# Patient Record
Sex: Female | Born: 1940 | Race: White | Hispanic: No | Smoking: Never smoker
Health system: Southern US, Community
[De-identification: ages and names within clinical notes are randomized; demographics above are authoritative.]

## PROBLEM LIST (undated history)

## (undated) DIAGNOSIS — K644 Residual hemorrhoidal skin tags: Secondary | ICD-10-CM

## (undated) DIAGNOSIS — D126 Benign neoplasm of colon, unspecified: Secondary | ICD-10-CM

## (undated) DIAGNOSIS — Z8719 Personal history of other diseases of the digestive system: Secondary | ICD-10-CM

## (undated) DIAGNOSIS — F419 Anxiety disorder, unspecified: Secondary | ICD-10-CM

## (undated) DIAGNOSIS — K649 Unspecified hemorrhoids: Secondary | ICD-10-CM

## (undated) DIAGNOSIS — L719 Rosacea, unspecified: Secondary | ICD-10-CM

## (undated) DIAGNOSIS — H4050X0 Glaucoma secondary to other eye disorders, unspecified eye, stage unspecified: Secondary | ICD-10-CM

## (undated) DIAGNOSIS — I10 Essential (primary) hypertension: Secondary | ICD-10-CM

## (undated) DIAGNOSIS — K222 Esophageal obstruction: Secondary | ICD-10-CM

## (undated) DIAGNOSIS — M5412 Radiculopathy, cervical region: Secondary | ICD-10-CM

## (undated) DIAGNOSIS — H409 Unspecified glaucoma: Secondary | ICD-10-CM

## (undated) DIAGNOSIS — K580 Irritable bowel syndrome with diarrhea: Secondary | ICD-10-CM

## (undated) DIAGNOSIS — N816 Rectocele: Secondary | ICD-10-CM

## (undated) DIAGNOSIS — Z860101 Personal history of adenomatous and serrated colon polyps: Secondary | ICD-10-CM

## (undated) DIAGNOSIS — F329 Major depressive disorder, single episode, unspecified: Secondary | ICD-10-CM

## (undated) DIAGNOSIS — N200 Calculus of kidney: Secondary | ICD-10-CM

## (undated) DIAGNOSIS — K210 Gastro-esophageal reflux disease with esophagitis: Secondary | ICD-10-CM

## (undated) DIAGNOSIS — E119 Type 2 diabetes mellitus without complications: Secondary | ICD-10-CM

## (undated) DIAGNOSIS — H9190 Unspecified hearing loss, unspecified ear: Secondary | ICD-10-CM

## (undated) DIAGNOSIS — K449 Diaphragmatic hernia without obstruction or gangrene: Secondary | ICD-10-CM

## (undated) DIAGNOSIS — Z8619 Personal history of other infectious and parasitic diseases: Secondary | ICD-10-CM

## (undated) DIAGNOSIS — M1990 Unspecified osteoarthritis, unspecified site: Secondary | ICD-10-CM

## (undated) DIAGNOSIS — R350 Frequency of micturition: Secondary | ICD-10-CM

## (undated) DIAGNOSIS — F32A Depression, unspecified: Secondary | ICD-10-CM

## (undated) DIAGNOSIS — Z974 Presence of external hearing-aid: Secondary | ICD-10-CM

## (undated) DIAGNOSIS — N189 Chronic kidney disease, unspecified: Secondary | ICD-10-CM

## (undated) DIAGNOSIS — Z85828 Personal history of other malignant neoplasm of skin: Secondary | ICD-10-CM

## (undated) DIAGNOSIS — H6120 Impacted cerumen, unspecified ear: Secondary | ICD-10-CM

## (undated) DIAGNOSIS — K5730 Diverticulosis of large intestine without perforation or abscess without bleeding: Secondary | ICD-10-CM

## (undated) DIAGNOSIS — R000 Tachycardia, unspecified: Secondary | ICD-10-CM

## (undated) DIAGNOSIS — N812 Incomplete uterovaginal prolapse: Secondary | ICD-10-CM

## (undated) DIAGNOSIS — M8580 Other specified disorders of bone density and structure, unspecified site: Secondary | ICD-10-CM

## (undated) DIAGNOSIS — Z86010 Personal history of colonic polyps: Secondary | ICD-10-CM

## (undated) DIAGNOSIS — K219 Gastro-esophageal reflux disease without esophagitis: Secondary | ICD-10-CM

## (undated) DIAGNOSIS — Z9889 Other specified postprocedural states: Secondary | ICD-10-CM

## (undated) DIAGNOSIS — N393 Stress incontinence (female) (male): Secondary | ICD-10-CM

## (undated) DIAGNOSIS — K589 Irritable bowel syndrome without diarrhea: Secondary | ICD-10-CM

## (undated) DIAGNOSIS — E785 Hyperlipidemia, unspecified: Secondary | ICD-10-CM

## (undated) DIAGNOSIS — Z87442 Personal history of urinary calculi: Secondary | ICD-10-CM

## (undated) DIAGNOSIS — A0472 Enterocolitis due to Clostridium difficile, not specified as recurrent: Secondary | ICD-10-CM

## (undated) HISTORY — DX: Glaucoma secondary to other eye disorders, unspecified eye, stage unspecified: H40.50X0

## (undated) HISTORY — PX: TONSILLECTOMY: SUR1361

## (undated) HISTORY — DX: Major depressive disorder, single episode, unspecified: F32.9

## (undated) HISTORY — DX: Benign neoplasm of colon, unspecified: D12.6

## (undated) HISTORY — DX: Other specified disorders of bone density and structure, unspecified site: M85.80

## (undated) HISTORY — DX: Gastro-esophageal reflux disease without esophagitis: K21.9

## (undated) HISTORY — DX: Residual hemorrhoidal skin tags: K64.4

## (undated) HISTORY — DX: Unspecified glaucoma: H40.9

## (undated) HISTORY — DX: Calculus of kidney: N20.0

## (undated) HISTORY — DX: Diverticulosis of large intestine without perforation or abscess without bleeding: K57.30

## (undated) HISTORY — DX: Hyperlipidemia, unspecified: E78.5

## (undated) HISTORY — DX: Rosacea, unspecified: L71.9

## (undated) HISTORY — PX: BUNIONECTOMY: SHX129

## (undated) HISTORY — PX: KIDNEY SURGERY: SHX687

## (undated) HISTORY — DX: Irritable bowel syndrome without diarrhea: K58.9

## (undated) HISTORY — DX: Radiculopathy, cervical region: M54.12

## (undated) HISTORY — DX: Tachycardia, unspecified: R00.0

## (undated) HISTORY — DX: Impacted cerumen, unspecified ear: H61.20

## (undated) HISTORY — DX: Rectocele: N81.6

## (undated) HISTORY — DX: Unspecified hearing loss, unspecified ear: H91.90

## (undated) HISTORY — DX: Enterocolitis due to Clostridium difficile, not specified as recurrent: A04.72

## (undated) HISTORY — PX: CERVICAL FUSION: SHX112

## (undated) HISTORY — PX: COLONOSCOPY: SHX174

## (undated) HISTORY — DX: Gastro-esophageal reflux disease with esophagitis: K21.0

## (undated) HISTORY — DX: Anxiety disorder, unspecified: F41.9

## (undated) HISTORY — DX: Esophageal obstruction: K22.2

## (undated) HISTORY — DX: Essential (primary) hypertension: I10

---

## 1958-08-11 HISTORY — PX: KIDNEY SURGERY: SHX687

## 1998-06-07 ENCOUNTER — Other Ambulatory Visit: Admission: RE | Admit: 1998-06-07 | Discharge: 1998-06-07 | Payer: Self-pay | Admitting: Gynecology

## 1998-07-04 ENCOUNTER — Other Ambulatory Visit: Admission: RE | Admit: 1998-07-04 | Discharge: 1998-07-04 | Payer: Self-pay | Admitting: Gynecology

## 1999-07-30 ENCOUNTER — Other Ambulatory Visit: Admission: RE | Admit: 1999-07-30 | Discharge: 1999-07-30 | Payer: Self-pay | Admitting: Gynecology

## 2000-09-21 ENCOUNTER — Other Ambulatory Visit: Admission: RE | Admit: 2000-09-21 | Discharge: 2000-09-21 | Payer: Self-pay | Admitting: Gynecology

## 2001-11-10 ENCOUNTER — Other Ambulatory Visit: Admission: RE | Admit: 2001-11-10 | Discharge: 2001-11-10 | Payer: Self-pay | Admitting: Gynecology

## 2004-07-15 ENCOUNTER — Ambulatory Visit: Payer: Self-pay | Admitting: Internal Medicine

## 2004-08-11 DIAGNOSIS — D126 Benign neoplasm of colon, unspecified: Secondary | ICD-10-CM

## 2004-08-11 HISTORY — DX: Benign neoplasm of colon, unspecified: D12.6

## 2004-08-27 ENCOUNTER — Ambulatory Visit: Payer: Self-pay | Admitting: Internal Medicine

## 2004-11-08 ENCOUNTER — Other Ambulatory Visit: Admission: RE | Admit: 2004-11-08 | Discharge: 2004-11-08 | Payer: Self-pay | Admitting: Gynecology

## 2005-01-24 ENCOUNTER — Ambulatory Visit: Payer: Self-pay | Admitting: Internal Medicine

## 2005-01-30 ENCOUNTER — Ambulatory Visit: Payer: Self-pay | Admitting: Internal Medicine

## 2005-01-30 ENCOUNTER — Encounter (INDEPENDENT_AMBULATORY_CARE_PROVIDER_SITE_OTHER): Payer: Self-pay | Admitting: *Deleted

## 2005-06-02 ENCOUNTER — Ambulatory Visit: Payer: Self-pay | Admitting: Internal Medicine

## 2005-10-01 ENCOUNTER — Ambulatory Visit: Payer: Self-pay | Admitting: Internal Medicine

## 2005-10-01 ENCOUNTER — Encounter: Payer: Self-pay | Admitting: Internal Medicine

## 2005-10-01 ENCOUNTER — Other Ambulatory Visit: Admission: RE | Admit: 2005-10-01 | Discharge: 2005-10-01 | Payer: Self-pay | Admitting: Neurosurgery

## 2006-05-20 ENCOUNTER — Ambulatory Visit: Payer: Self-pay | Admitting: Internal Medicine

## 2006-08-12 ENCOUNTER — Ambulatory Visit: Payer: Self-pay | Admitting: Internal Medicine

## 2006-10-06 ENCOUNTER — Ambulatory Visit: Payer: Self-pay | Admitting: Internal Medicine

## 2006-11-10 ENCOUNTER — Ambulatory Visit: Payer: Self-pay | Admitting: Internal Medicine

## 2006-11-14 LAB — HM COLONOSCOPY

## 2006-11-16 ENCOUNTER — Encounter: Payer: Self-pay | Admitting: Internal Medicine

## 2006-11-16 ENCOUNTER — Ambulatory Visit: Payer: Self-pay | Admitting: Internal Medicine

## 2006-12-10 ENCOUNTER — Ambulatory Visit: Payer: Self-pay | Admitting: Internal Medicine

## 2006-12-10 LAB — CONVERTED CEMR LAB
Basophils Relative: 0.3 % (ref 0.0–1.0)
Eosinophils Absolute: 0.1 10*3/uL (ref 0.0–0.6)
Eosinophils Relative: 1 % (ref 0.0–5.0)
Hemoglobin: 13.9 g/dL (ref 12.0–15.0)
Lymphocytes Relative: 29.5 % (ref 12.0–46.0)
MCV: 92.6 fL (ref 78.0–100.0)
Monocytes Absolute: 0.5 10*3/uL (ref 0.2–0.7)
Monocytes Relative: 7.1 % (ref 3.0–11.0)
Neutro Abs: 4.5 10*3/uL (ref 1.4–7.7)
Platelets: 271 10*3/uL (ref 150–400)
WBC: 7.2 10*3/uL (ref 4.5–10.5)

## 2007-03-11 ENCOUNTER — Ambulatory Visit: Payer: Self-pay | Admitting: Internal Medicine

## 2007-03-11 DIAGNOSIS — F329 Major depressive disorder, single episode, unspecified: Secondary | ICD-10-CM

## 2007-03-11 DIAGNOSIS — K644 Residual hemorrhoidal skin tags: Secondary | ICD-10-CM | POA: Insufficient documentation

## 2007-03-11 DIAGNOSIS — F325 Major depressive disorder, single episode, in full remission: Secondary | ICD-10-CM | POA: Insufficient documentation

## 2007-03-11 DIAGNOSIS — F3341 Major depressive disorder, recurrent, in partial remission: Secondary | ICD-10-CM

## 2007-03-11 DIAGNOSIS — I10 Essential (primary) hypertension: Secondary | ICD-10-CM | POA: Insufficient documentation

## 2007-03-11 DIAGNOSIS — E1159 Type 2 diabetes mellitus with other circulatory complications: Secondary | ICD-10-CM | POA: Insufficient documentation

## 2007-03-11 DIAGNOSIS — J018 Other acute sinusitis: Secondary | ICD-10-CM | POA: Insufficient documentation

## 2007-03-11 DIAGNOSIS — F3289 Other specified depressive episodes: Secondary | ICD-10-CM

## 2007-03-11 DIAGNOSIS — L719 Rosacea, unspecified: Secondary | ICD-10-CM | POA: Insufficient documentation

## 2007-03-11 DIAGNOSIS — K5730 Diverticulosis of large intestine without perforation or abscess without bleeding: Secondary | ICD-10-CM

## 2007-03-11 HISTORY — DX: Essential (primary) hypertension: I10

## 2007-03-11 HISTORY — DX: Diverticulosis of large intestine without perforation or abscess without bleeding: K57.30

## 2007-03-11 HISTORY — DX: Rosacea, unspecified: L71.9

## 2007-03-11 HISTORY — DX: Major depressive disorder, single episode, unspecified: F32.9

## 2007-03-11 HISTORY — DX: Residual hemorrhoidal skin tags: K64.4

## 2007-03-11 HISTORY — DX: Other specified depressive episodes: F32.89

## 2007-03-11 LAB — CONVERTED CEMR LAB
Basophils Absolute: 0 10*3/uL (ref 0.0–0.1)
Eosinophils Absolute: 0.1 10*3/uL (ref 0.0–0.6)
HCT: 36.1 % (ref 36.0–46.0)
MCHC: 34.3 g/dL (ref 30.0–36.0)
MCV: 93.9 fL (ref 78.0–100.0)
Neutrophils Relative %: 64.1 % (ref 43.0–77.0)
Platelets: 270 10*3/uL (ref 150–400)
RBC: 3.84 M/uL — ABNORMAL LOW (ref 3.87–5.11)
RDW: 12.1 % (ref 11.5–14.6)

## 2007-08-20 ENCOUNTER — Telehealth: Payer: Self-pay | Admitting: Internal Medicine

## 2007-09-22 ENCOUNTER — Ambulatory Visit: Payer: Self-pay | Admitting: Internal Medicine

## 2007-09-22 DIAGNOSIS — E785 Hyperlipidemia, unspecified: Secondary | ICD-10-CM

## 2007-09-22 DIAGNOSIS — E1169 Type 2 diabetes mellitus with other specified complication: Secondary | ICD-10-CM | POA: Insufficient documentation

## 2007-09-22 HISTORY — DX: Hyperlipidemia, unspecified: E78.5

## 2007-09-22 LAB — CONVERTED CEMR LAB
Basophils Absolute: 0 10*3/uL (ref 0.0–0.1)
Calcium: 9.7 mg/dL (ref 8.4–10.5)
Chloride: 105 meq/L (ref 96–112)
Cholesterol: 173 mg/dL (ref 0–200)
Eosinophils Absolute: 0 10*3/uL (ref 0.0–0.6)
Eosinophils Relative: 0.8 % (ref 0.0–5.0)
GFR calc Af Amer: 92 mL/min
GFR calc non Af Amer: 76 mL/min
Glucose, Bld: 111 mg/dL — ABNORMAL HIGH (ref 70–99)
HDL: 41.9 mg/dL (ref >39.0)
Lymphocytes Relative: 29.1 % (ref 12.0–46.0)
MCHC: 32.7 g/dL (ref 30.0–36.0)
MCV: 94.3 fL (ref 78.0–100.0)
Monocytes Relative: 6.3 % (ref 3.0–11.0)
Neutro Abs: 3.9 10*3/uL (ref 1.4–7.7)
Platelets: 330 10*3/uL (ref 150–400)
RBC: 4.31 M/uL (ref 3.87–5.11)
Total CHOL/HDL Ratio: 4.1
Triglycerides: 126 mg/dL (ref 0–149)

## 2007-11-11 ENCOUNTER — Ambulatory Visit: Payer: Self-pay | Admitting: Internal Medicine

## 2007-12-13 DIAGNOSIS — H6120 Impacted cerumen, unspecified ear: Secondary | ICD-10-CM | POA: Insufficient documentation

## 2007-12-13 HISTORY — DX: Impacted cerumen, unspecified ear: H61.20

## 2008-03-22 ENCOUNTER — Ambulatory Visit: Payer: Self-pay | Admitting: Internal Medicine

## 2008-03-23 ENCOUNTER — Encounter: Payer: Self-pay | Admitting: Internal Medicine

## 2008-05-10 ENCOUNTER — Inpatient Hospital Stay (HOSPITAL_COMMUNITY): Admission: RE | Admit: 2008-05-10 | Discharge: 2008-05-13 | Payer: Self-pay | Admitting: Neurosurgery

## 2008-05-10 HISTORY — PX: ANTERIOR CERVICAL DECOMP/DISCECTOMY FUSION: SHX1161

## 2008-10-20 ENCOUNTER — Telehealth: Payer: Self-pay | Admitting: Family Medicine

## 2008-10-20 ENCOUNTER — Ambulatory Visit: Payer: Self-pay | Admitting: Internal Medicine

## 2008-10-20 DIAGNOSIS — IMO0002 Reserved for concepts with insufficient information to code with codable children: Secondary | ICD-10-CM | POA: Insufficient documentation

## 2008-12-11 ENCOUNTER — Telehealth: Payer: Self-pay | Admitting: Internal Medicine

## 2009-03-14 ENCOUNTER — Ambulatory Visit: Payer: Self-pay | Admitting: Family Medicine

## 2009-03-14 ENCOUNTER — Encounter: Payer: Self-pay | Admitting: Internal Medicine

## 2009-03-14 DIAGNOSIS — R059 Cough, unspecified: Secondary | ICD-10-CM | POA: Insufficient documentation

## 2009-03-14 DIAGNOSIS — R0989 Other specified symptoms and signs involving the circulatory and respiratory systems: Secondary | ICD-10-CM | POA: Insufficient documentation

## 2009-03-14 DIAGNOSIS — R05 Cough: Secondary | ICD-10-CM

## 2009-03-14 DIAGNOSIS — R0609 Other forms of dyspnea: Secondary | ICD-10-CM

## 2009-03-16 ENCOUNTER — Telehealth: Payer: Self-pay | Admitting: Family Medicine

## 2009-06-01 ENCOUNTER — Encounter: Payer: Self-pay | Admitting: Internal Medicine

## 2009-06-29 ENCOUNTER — Ambulatory Visit: Payer: Self-pay | Admitting: Internal Medicine

## 2009-07-02 ENCOUNTER — Telehealth: Payer: Self-pay | Admitting: Internal Medicine

## 2009-10-22 ENCOUNTER — Telehealth: Payer: Self-pay | Admitting: Internal Medicine

## 2009-11-05 ENCOUNTER — Ambulatory Visit: Payer: Self-pay | Admitting: Internal Medicine

## 2009-11-05 DIAGNOSIS — T887XXA Unspecified adverse effect of drug or medicament, initial encounter: Secondary | ICD-10-CM | POA: Insufficient documentation

## 2009-11-05 DIAGNOSIS — H4050X0 Glaucoma secondary to other eye disorders, unspecified eye, stage unspecified: Secondary | ICD-10-CM | POA: Insufficient documentation

## 2009-11-05 DIAGNOSIS — M5412 Radiculopathy, cervical region: Secondary | ICD-10-CM

## 2009-11-05 DIAGNOSIS — M5010 Cervical disc disorder with radiculopathy, unspecified cervical region: Secondary | ICD-10-CM | POA: Insufficient documentation

## 2009-11-05 HISTORY — DX: Radiculopathy, cervical region: M54.12

## 2009-11-05 HISTORY — DX: Glaucoma secondary to other eye disorders, unspecified eye, stage unspecified: H40.50X0

## 2009-11-05 LAB — CONVERTED CEMR LAB
BUN: 12 mg/dL (ref 6–23)
Bilirubin, Direct: 0.1 mg/dL (ref 0.0–0.3)
CO2: 32 meq/L (ref 19–32)
Calcium: 9.8 mg/dL (ref 8.4–10.5)
Chloride: 104 meq/L (ref 96–112)
Creatinine, Ser: 0.7 mg/dL (ref 0.4–1.2)
Direct LDL: 113.7 mg/dL
Eosinophils Absolute: 0.1 10*3/uL (ref 0.0–0.7)
Lymphocytes Relative: 34.4 % (ref 12.0–46.0)
MCHC: 32 g/dL (ref 30.0–36.0)
MCV: 97 fL (ref 78.0–100.0)
Monocytes Absolute: 0.5 10*3/uL (ref 0.1–1.0)
Neutrophils Relative %: 56.6 % (ref 43.0–77.0)
Platelets: 255 10*3/uL (ref 150.0–400.0)
TSH: 0.99 microintl units/mL (ref 0.35–5.50)
Total Bilirubin: 1.1 mg/dL (ref 0.3–1.2)
WBC: 6.4 10*3/uL (ref 4.5–10.5)

## 2010-06-14 ENCOUNTER — Emergency Department (HOSPITAL_COMMUNITY): Admission: EM | Admit: 2010-06-14 | Discharge: 2010-06-14 | Payer: Self-pay | Admitting: Emergency Medicine

## 2010-06-27 ENCOUNTER — Ambulatory Visit: Payer: Self-pay | Admitting: Internal Medicine

## 2010-07-11 DIAGNOSIS — S41109A Unspecified open wound of unspecified upper arm, initial encounter: Secondary | ICD-10-CM | POA: Insufficient documentation

## 2010-08-22 ENCOUNTER — Encounter: Payer: Self-pay | Admitting: Internal Medicine

## 2010-08-22 ENCOUNTER — Other Ambulatory Visit
Admission: RE | Admit: 2010-08-22 | Discharge: 2010-08-22 | Payer: Self-pay | Source: Home / Self Care | Admitting: Gynecology

## 2010-08-22 ENCOUNTER — Ambulatory Visit
Admission: RE | Admit: 2010-08-22 | Discharge: 2010-08-22 | Payer: Self-pay | Source: Home / Self Care | Attending: Gynecology | Admitting: Gynecology

## 2010-08-27 ENCOUNTER — Ambulatory Visit
Admission: RE | Admit: 2010-08-27 | Discharge: 2010-08-27 | Payer: Self-pay | Source: Home / Self Care | Attending: Gynecology | Admitting: Gynecology

## 2010-09-12 NOTE — Assessment & Plan Note (Signed)
Summary: BP / HTN CONCERNS // RS   Vital Signs:  Patient profile:   70 year old female Height:      53 inches Weight:      125 pounds BMI:     31.40 Temp:     98.2 degrees F oral Pulse rate:   80 / minute Resp:     14 per minute BP sitting:   130 / 80  (left arm)  Vitals Entered By: Willy Eddy, LPN (November 05, 2009 11:31 AM) CC: roa- htn, Hypertension Management   CC:  roa- htn and Hypertension Management.  History of Present Illness: new diagnosis of glacoma and need referral  Hypertension History:      She denies headache, chest pain, palpitations, dyspnea with exertion, orthopnea, PND, peripheral edema, visual symptoms, neurologic problems, syncope, and side effects from treatment.        Positive major cardiovascular risk factors include female age 40 years old or older, hyperlipidemia, and hypertension.  Negative major cardiovascular risk factors include negative family history for ischemic heart disease and non-tobacco-user status.     Preventive Screening-Counseling & Management  Alcohol-Tobacco     Smoking Status: never     Passive Smoke Exposure: yes  Problems Prior to Update: 1)  Cough  (ICD-786.2) 2)  Dyspnea On Exertion  (ICD-786.09) 3)  Abrasion, Arm  (ICD-919.0) 4)  Cerumen Impaction, Bilateral  (ICD-380.4) 5)  Hyperlipidemia  (ICD-272.4) 6)  Sinusitis, Acute Nec  (ICD-461.8) 7)  Hemorrhoids, External w/o Complication  (ICD-455.3) 8)  Diverticulosis, Colon  (ICD-562.10) 9)  Rosacea  (ICD-695.3) 10)  Depression  (ICD-311) 11)  Hypertension  (ICD-401.9)  Current Problems (verified): 1)  Cough  (ICD-786.2) 2)  Dyspnea On Exertion  (ICD-786.09) 3)  Abrasion, Arm  (ICD-919.0) 4)  Cerumen Impaction, Bilateral  (ICD-380.4) 5)  Hyperlipidemia  (ICD-272.4) 6)  Sinusitis, Acute Nec  (ICD-461.8) 7)  Hemorrhoids, External w/o Complication  (ICD-455.3) 8)  Diverticulosis, Colon  (ICD-562.10) 9)  Rosacea  (ICD-695.3) 10)  Depression  (ICD-311) 11)   Hypertension  (ICD-401.9)  Medications Prior to Update: 1)  Avalide 300-12.5 Mg  Tabs (Irbesartan-Hydrochlorothiazide) .... Once Daily 2)  Lexapro 20 Mg Tabs (Escitalopram Oxalate) .... One By Mouth Daily 3)  Niacin Flush Free 500 Mg  Caps (Inositol Niacinate) .... Once Daily 4)  Calcium 600 1500 Mg  Tabs (Calcium Carbonate) .... One Twice A Day 5)  Bl Flax Seed Oil 1000 Mg  Caps (Flaxseed (Linseed)) .... Once Daily 6)  Alprazolam 0.25 Mg Tabs (Alprazolam) .[name].. 1 Three Times A Day Prn 7)  Doxycycline Hyclate 100 Mg Tabs (Doxycycline Hyclate) .... One Two Times A Day For 7 Days If Not Allergic To Doxycycline  Current Medications (verified): 1)  Avalide 300-12.5 Mg  Tabs (Irbesartan-Hydrochlorothiazide) .... Once Daily 2)  Lexapro 20 Mg Tabs (Escitalopram Oxalate) .... One By Mouth Daily 3)  Niacin Flush Free 500 Mg  Caps (Inositol Niacinate) .... Once Daily 4)  Calcium 600 1500 Mg  Tabs (Calcium Carbonate) .... One Twice A Day 5)  Bl Flax Seed Oil 1000 Mg  Caps (Flaxseed (Linseed)) .... Once Daily 6)  Alprazolam 0.25 Mg Tabs (Alprazolam) .[name].. 1 Three Times A Day As Needed  Allergies (verified): 1)  * Codiene  Past History:  Family History: Last updated: 09/22/2007 Family History Lung cancer father smoker Family History Psychiatric care mother committed suicide  Social History: Last updated: 03/11/2007 Retired Married Never Smoked  Risk Factors: Smoking Status: never (11/05/2009) Passive Smoke Exposure: yes (  11/05/2009)  Past medical, surgical, family and social histories (including risk factors) reviewed, and no changes noted (except as noted below).  Past Medical History: Reviewed history from 09/22/2007 and no changes required. Hypertension Depression Rosacea 695.3 Diverticulosis, colon hemorrhoids 455.3 Hyperlipidemia  Past Surgical History: Reviewed history from 03/11/2007 and no changes required. renal surgery age 72  partial Nephrectomy due to  stones  Family History: Reviewed history from 09/22/2007 and no changes required. Family History Lung cancer father smoker Family History Psychiatric care mother committed suicide  Social History: Reviewed history from 03/11/2007 and no changes required. Retired Married Never Smoked  Review of Systems  The patient denies anorexia, fever, weight loss, weight gain, vision loss, decreased hearing, hoarseness, chest pain, syncope, dyspnea on exertion, peripheral edema, prolonged cough, headaches, hemoptysis, abdominal pain, melena, hematochezia, severe indigestion/heartburn, hematuria, incontinence, genital sores, muscle weakness, suspicious skin lesions, transient blindness, difficulty walking, depression, unusual weight change, abnormal bleeding, enlarged lymph nodes, angioedema, and breast masses.    Physical Exam  General:  Well-developed,well-nourished,in no acute distress; alert,appropriate and cooperative throughout examination Head:  normocephalic and atraumatic.   Eyes:  pupils equal, pupils round, and pupils reactive to light.   Ears:  R ear normal and L ear normal.   Nose:  no external deformity and no nasal discharge.   Mouth:  Oral mucosa and oropharynx without lesions or exudates.  Teeth in good repair. Neck:  No deformities, masses, or tenderness noted. Lungs:  Normal respiratory effort, chest expands symmetrically. Lungs are clear to auscultation, no crackles or wheezes. Heart:  normal rate, regular rhythm, and no murmur.     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 Once daily  BP today: 130/80 Prior  BP: 120/70 (03/14/2009)  Prior 10 Yr Risk Heart Disease: Not enough information (09/22/2007)  Labs Reviewed: K+: 5.1 (09/22/2007) Creat: : 0.8 (09/22/2007)   Chol: 173 (09/22/2007)   HDL: 41.9 (09/22/2007)   LDL: 106 (09/22/2007)   TG: 126 (09/22/2007)  Orders: TLB-BMP (Basic Metabolic Panel-BMET) (80048-METABOL)  Problem # 3:  HYPERLIPIDEMIA (ICD-272.4)  Prior 10 Yr Risk Heart Disease: Not enough information (09/22/2007)   HDL:41.9 (09/22/2007)  LDL:106 (09/22/2007)  Chol:173 (09/22/2007)  Trig:126 (09/22/2007)  Orders: TLB-TSH (Thyroid Stimulating Hormone) (84443-TSH) TLB-Cholesterol, HDL (83718-HDL) TLB-Cholesterol, Direct LDL (83721-DIRLDL) TLB-Cholesterol, Total (82465-CHO)  Problem # 4:  DEPRESSION (ICD-311)  Her updated medication list for this problem includes:    Lexapro 20 Mg Tabs (Escitalopram oxalate) ..... One by mouth daily    Alprazolam 0.25 Mg Tabs (Alprazolam) .[name]... 1 three times a day as needed  Orders: TLB-BMP (Basic Metabolic Panel-BMET) (80048-METABOL)  BP today: 130/80 Prior BP: 120/70 (03/14/2009)  Prior 10 Yr Risk Heart Disease: Not enough information (09/22/2007)  Labs Reviewed: K+: 5.1 (09/22/2007) Creat: : 0.8 (09/22/2007)   Chol: 173 (09/22/2007)   HDL: 41.9 (09/22/2007)   LDL: 106 (09/22/2007)   TG: 126 (09/22/2007)  Discussed treatment options, including trial of antidpressant medication. Will refer to behavioral health. Follow-up call in in 24-48 hours and recheck in 2 weeks, sooner as needed. Patient agrees to call if any worsening of symptoms or thoughts of doing harm arise. Verified that the patient has no suicidal ideation at this time.   Complete Medication  List: 1)  Avalide 300-12.5 Mg Tabs (Irbesartan-hydrochlorothiazide) .... Once daily 2)  Lexapro 20 Mg Tabs (Escitalopram oxalate) .... One by mouth daily 3)  Niacin Flush Free 500 Mg Caps (Inositol niacinate) .... Once daily 4)  Calcium 600 1500 Mg Tabs (Calcium carbonate) .... One  twice a day 5)  Bl Flax Seed Oil 1000 Mg Caps (Flaxseed (linseed)) .... Once daily 6)  Alprazolam 0.25 Mg Tabs (Alprazolam) .[name].. 1 three times a day as needed  Other Orders: TLB-CBC Platelet - w/Differential (85025-CBCD)  Hypertension Assessment/Plan:      The patient's hypertensive risk group is category B: At least one risk factor (excluding diabetes) with no target organ damage.  Her calculated 10 year risk of coronary heart disease is 13 %.  Today's blood pressure is 130/80.  Her blood pressure goal is < 140/90.  Patient Instructions: 1)  for eye call [facility] ( [facility] [address]  or [facility])  Please schedule a follow-up appointment in 6 months.

## 2010-09-12 NOTE — Progress Notes (Signed)
Summary: avalide  Phone Note Call from Patient Call back at [facility] Phone [phone]   Summary of Call: Has changed insurance to [facility] & needs new rx called to [facility] for Avalide  300/12.5.  Making appt to see Dr. Shela Commons ASAP.    Initial call taken by: Rudy Jew, RN,  October 22, 2009 11:37 AM    Prescriptions: Adan Sis 300-12.5 MG  TABS (IRBESARTAN-HYDROCHLOROTHIAZIDE) once daily  #30 x 1   Entered by:   Willy Eddy, LPN   Authorized by:   Stacie Glaze MD   Signed by:   Willy Eddy, LPN on 09/81/1914   Method used:   Electronically to        [facility]  [phone]* (retail)       [address]       [place], [place]  [address]       Ph: [phone] or [phone]       Fax: [phone]   RxID:   [id number]

## 2010-09-12 NOTE — Assessment & Plan Note (Signed)
Summary: SUTURE REMOVAL / FLU SHOT // RS----OK PER BONNYE, LPN   Vital Signs:  Patient profile:   70 year old female Height:      53 inches Weight:      125 pounds BMI:     31.40 Temp:     98.6 degrees F oral Pulse rate:   80 / minute Resp:     14 per minute BP sitting:   130 / 80  Vitals Entered By: Willy Eddy, LPN (June 27, 2010 11:22 AM) CC: suture removal out of left elbow- 2 sutures removed and flue vaccine Is Patient Diabetic? No   Primary Care Shahrzad Koble:  Stacie Glaze MD  CC:  suture removal out of left elbow- 2 sutures removed and flue vaccine.  History of Present Illness: removla of sutures from ER  Preventive Screening-Counseling & Management  Alcohol-Tobacco     Smoking Status: never     Passive Smoke Exposure: yes  Current Problems (verified): 1)  Uns Advrs Eff Uns Rx Medicinal&biological Sbstnc  (ICD-995.20) 2)  Cervical Radiculopathy  (ICD-723.4) 3)  Glaucoma Associated W/unspec Ocular Disorder  (ICD-365.60) 4)  Cough  (ICD-786.2) 5)  Dyspnea On Exertion  (ICD-786.09) 6)  Abrasion, Arm  (ICD-919.0) 7)  Cerumen Impaction, Bilateral  (ICD-380.4) 8)  Hyperlipidemia  (ICD-272.4) 9)  Sinusitis, Acute Nec  (ICD-461.8) 10)  Hemorrhoids, External w/o Complication  (ICD-455.3) 11)  Diverticulosis, Colon  (ICD-562.10) 12)  Rosacea  (ICD-695.3) 13)  Depression  (ICD-311) 14)  Hypertension  (ICD-401.9)  Current Medications (verified): 1)  Avalide 300-12.5 Mg  Tabs (Irbesartan-Hydrochlorothiazide) .... Once Daily 2)  Lexapro 20 Mg Tabs (Escitalopram Oxalate) .... One By Mouth Daily 3)  Niacin Flush Free 500 Mg  Caps (Inositol Niacinate) .... Once Daily 4)  Calcium 600 1500 Mg  Tabs (Calcium Carbonate) .... One Twice A Day 5)  Bl Flax Seed Oil 1000 Mg  Caps (Flaxseed (Linseed)) .... Once Daily 6)  Alprazolam 0.25 Mg Tabs (Alprazolam) .[name].. 1 Three Times A Day As Needed  Allergies (verified): 1)  * Codiene  Review of Systems       Flu Vaccine  Consent Questions     Do you have a history of severe allergic reactions to this vaccine? no    Any prior history of allergic reactions to egg and/or gelatin? no    Do you have a sensitivity to the preservative Thimersol? no    Do you have a past history of Guillan-Barre Syndrome? no    Do you currently have an acute febrile illness? no    Have you ever had a severe reaction to latex? no    Vaccine information given and explained to patient? yes    Are you currently pregnant? no    Lot Number:[id number]   Exp Date:02/08/2011   Site Given  Left Deltoid IM    Impression & Recommendations:  Problem # 1:  WOUND, OPEN, ARM, WITHOUT COMPLICATION (ICD-884.0)  removal of suture and discssion of would care  Orders: No Charge Patient Arrived (NCPA0) (NCPA0) Suture Removal by Non-Operative MD ([id number])  Complete Medication List: 1)  Avalide 300-12.5 Mg Tabs (Irbesartan-hydrochlorothiazide) .... Once daily 2)  Lexapro 20 Mg Tabs (Escitalopram oxalate) .... One by mouth daily 3)  Niacin Flush Free 500 Mg Caps (Inositol niacinate) .... Once daily 4)  Calcium 600 1500 Mg Tabs (Calcium carbonate) .... One twice a day 5)  Bl Flax Seed Oil 1000 Mg Caps (Flaxseed (linseed)) .... Once daily 6)  Alprazolam 0.25 Mg  Tabs (Alprazolam) .[name].. 1 three times a day as needed  Other Orders: Flu Vaccine 22yrs + MEDICARE PATIENTS (Z6109) Administration Flu vaccine - MCR (G0008)   Orders Added: 1)  Flu Vaccine 89yrs + MEDICARE PATIENTS [Q2039] 2)  Administration Flu vaccine - MCR [G0008] 3)  No Charge Patient Arrived (NCPA0) [NCPA0] 4)  Suture Removal by Non-Operative MD [S0630]

## 2010-12-24 NOTE — Op Note (Signed)
[name], [name]                ACCOUNT NO.:  [id number]   MEDICAL RECORD NO.:  [id number]          PATIENT TYPE:  INP   LOCATION:  3008                         FACILITY:  [facility]   PHYSICIAN:  Payton Doughty, M.D.      DATE OF BIRTH:  04-06-1941   DATE OF PROCEDURE:  05/10/2008  DATE OF DISCHARGE:                               OPERATIVE REPORT   PREOPERATIVE DIAGNOSIS:  Spondylosis, C4-5 and C5-6.   POSTOPERATIVE DIAGNOSIS:  Spondylosis, C4-5 and C5-6.   PROCEDURE:  C4-5 and C5-6 anterior cervical decompression and fusion  with a Reflex hybrid plate.   SURGEON:  Payton Doughty, MD   ANESTHESIA:  General endotracheal.   PREPARATION:  Prepped and draped with alcohol wipe.   COMPLICATIONS:  None.   NURSE ASSISTANT:  [place].   DOCTOR ASSISTANT:  Hewitt Shorts, MD   BODY OF TEXT:  This is a 70 year old lady with severe cervical  spondylosis and early myelopathy taken to the operating room and  smoothly anesthetized and intubated, placed supine on the operating  table in the halter head traction with the neck slightly extended.  Following shave, prep, and drape in usual sterile fashion, the skin was  incised in the midline to the medial border of the sternocleidomastoid  muscle on the left side.  The platysma was identified, elevated,  divided,and undermined.  Sternocleidomastoid was identified.  Medial  dissection revealed carotid artery and jugular vein that were retracted  laterally to the left, the trachea and esophagus retracted laterally to  the right exposing the bones of the anterior cervical spine.  Marker was  placed.  Intraoperative x-ray obtained to confirm correctness of the  level.  The longus colli was taken down bilaterally, and the __________  self-retaining retractor placed transversely in a cephalocaudal  direction.  Diskectomy was carried out at C4-5 and C5-6 under gross  observation.  The operating microscope was then brought in.  We used  microdissection technique to remove the remaining disks, remove the  osteophytic spurs, explore the neural foramen, and make sure the nerve  roots were free.  Following complete decompression at each level, a 7-mm  bone graft was fashioned with patellar allograft and tapped in each  level.  A 34-mm Reflex hybrid plate was placed.  Final films showed that  the screws in C4 were low, so a 36-mm plate was placed using a 12-mm  screw.  Final x-ray showed good placement of bone graft, plate, and  screws.  The wound was irrigated and hemostasis assured.  Successive  layers of 3-0 Vicryl and 4-0 Vicryl were used to close.  Benzoin and  Steri-Strips were placed and made occlusive with Telfa and OpSite, and  the patient returned to the recovery room in good condition.           ______________________________  Payton Doughty, M.D.     MWR/MEDQ  D:  05/10/2008  T:  05/10/2008  Job:  [id number]

## 2010-12-24 NOTE — Discharge Summary (Signed)
[name], [name]                ACCOUNT NO.:  [id number]   MEDICAL RECORD NO.:  [id number]          PATIENT TYPE:  INP   LOCATION:  3008                         FACILITY:  [facility]   PHYSICIAN:  Coletta Memos, M.D.     DATE OF BIRTH:  15-May-1941   DATE OF ADMISSION:  05/10/2008  DATE OF DISCHARGE:  05/13/2008                               DISCHARGE SUMMARY   ADMITTING DIAGNOSIS:  Cervical spondylosis, C4-C5 and C5-C6.   DISCHARGE DIAGNOSIS:  Cervical spondylosis, C4-C5 and C5-C6.   PROCEDURES:  1. Anterior cervical decompression, C4-C5 and C5-C6.  2. Arthrodesis, C4 through C6.  3. Anterior instrumentation, C4 through C6.   COMPLICATIONS:  None.   DISCHARGE STATUS:  Alive and well.  Wound, clean and dry, no signs of  infection.  A 5/5 strength in the upper extremities.  Voice is strong.  She has been able to void without difficulty.  She will be sent home  with Darvocet.  She will call the office for a return appointment in  approximately 2 weeks.  Her hospitalization has been quite routine.  She  is doing well.           ______________________________  Coletta Memos, M.D.     KC/MEDQ  D:  05/13/2008  T:  05/13/2008  Job:  [id number]

## 2010-12-24 NOTE — H&P (Signed)
[name], [name]                ACCOUNT NO.:  [id number]   MEDICAL RECORD NO.:  [id number]          PATIENT TYPE:  INP   LOCATION:  3008                         FACILITY:  [facility]   PHYSICIAN:  Payton Doughty, M.D.      DATE OF BIRTH:  05/01/1941   DATE OF ADMISSION:  05/10/2008  DATE OF DISCHARGE:                              HISTORY & PHYSICAL   ADMITTING DIAGNOSIS:  Spondylosis C4-5, C5-6.   BODY TEXT:  This is a 70 year old right-handed white lady, beginning in  May has been experiencing some difficulty with pain down her proximal  left arm.  She got some dysesthesias into her hand.  An MRI was obtained  which showed spondylosis and cord compression at C4-5 and C5-6.  She was  offered an operation and she was admitted.   PAST MEDICAL HISTORY:  Remarkable for hypertension.   She is on Avalide, Lexapro, Azopt for glaucoma, calcium, flaxseed, and  niacin.   ALLERGIES:  She gets nauseated with CODEINE.   PAST SURGICAL HISTORY:  Kidney stones in 56s and a bunion in 18s.   SOCIAL HISTORY:  She does not smoke and does not drink.  She is a  housewife.   FAMILY HISTORY:  Both parents are deceased.  There is a history of  hypertension in the family.   REVIEW OF SYSTEMS:  Remarkable for glaucoma, hearing loss, hypertension,  arm weakness, and neck pain.   HEENT:  Normal limits.  NECK:  She has good range of motion of the neck.  CHEST:  Clear.  CARDIAC:  Regular rate and rhythm.  ABDOMEN:  Nontender.  No hepatosplenomegaly.  EXTREMITIES:  Without clubbing or cyanosis.  GU:  Deferred.  EXTREMITIES:  Peripheral pulses are good.  NEUROLOGIC:  She is awake, alert and oriented.  Her cranial nerves are  intact.  MOTOR:  A 5/5 strength throughout the right upper extremity.  In the  left upper extremity, she has -5/5 and weakness in the biceps and  triceps.  Sensory deficits described in the left C6 distribution.  Reflexes are 2 at the right biceps and triceps, 1 at the right biceps,  2  at the right triceps, and 1 at the brachioradialis.  Hoffman's is  negative.  Lower extremities are non-myopathic.   She comes in accompanied with an MRI on a disk that shows spondylosis  and cord compression of C4-5 and C5-6.   CLINICAL IMPRESSION:  Left C5 and C6 radiculopathy.   PLAN:  An anterior decompression and fusion of both C4-5 and C5-6.  The  risks and benefits have been discussed with her, and she wishes to  proceed.           ______________________________  Payton Doughty, M.D.     MWR/MEDQ  D:  05/10/2008  T:  05/10/2008  Job:  [id number]

## 2010-12-27 NOTE — Assessment & Plan Note (Signed)
[facility]                         GASTROENTEROLOGY OFFICE NOTE   NAME:[name], [name]                       MRN:          [id number]  DATE:12/10/2006                            DOB:          07/11/1941    CHIEF COMPLAINT:  Continued rectal bleeding.   PROBLEMS:  1. Complaints of rectal bleeding.  Recent problems with abdominal      pain, crampy pain, treated with Cipro for presumptive      diverticulitis.  2. Change in bowel movements with loose stools.  3. Colonoscopy, November 16, 2006, showing diverticulosis and external      hemorrhoids.  4. History of adenoma and has colon polyps.  5. Hypertension.  6. Depression.  7. Previous nephrolithiasis with surgery.  8. Previous bunionectomy.   INTERVAL HISTORY:  [name] is still complaining of several loose bowel  movements in the morning, though there is no pain or bloating or  crampiness at this time.  She gets up in the morning and she walks, and  has to go back to her house to defecate 2 or 3 times.  She does drink  several cups of coffee a day, but that seems to come after she moves her  bowels, and she has been doing that for years.  She is concerned because  she still sees wine-colored stools at times, maybe some bright red  blood.  She does not look at all of her bowel movements, but every time  she does look she says the color change is there.  She is not describing  fever or chills.  She still is stressed and grieving over her husband's  death, and she also has had to go to 3 funerals this week because of  friends or relatives that have died.  Overall, she is somewhat better  but still has these persistent problems, so she returned.   PAST MEDICAL HISTORY:  As stated above.   SOCIAL/FAMILY HISTORY:  Reviewed and unchanged from November 10, 2006.   PHYSICAL EXAMINATION:  Reveals a weight of 133 pounds, pulse 80, blood  pressure 142/88.  EYES:  Anicteric.  SKIN:  Warm and dry.  No rash.  ABDOMEN:  Soft and nontender without organomegaly or mass.  RECTAL EXAM:  In the presence of a female nursing staff, shows some  external hemorrhoids, shows brown, soft stool that is hemoccult  negative.  There are no anorectal lesions otherwise.   Previous colonoscopy report, past medical history, previous pathology  reviewed today.   PLAN:  1. Order CBC.  If she is anemic, that may require workup of the upper      GI tract or small bowel.  2. I have asked her to carefully observe these stools as I am not      certain she is seeing blood in the stool.  The bright red blood is      most likely her external hemorrhoids.  3. Try an Imodium at bedtime or in the morning to try to calm down the      loose bowel movements that I have perceived to be irritable  bowel      syndrome.  4. Hydrocortisone cream 2.5% inserted into the rectum at bedtime for 2      weeks.  5. If she still has problems, she is to come back.   Note:  Medications are listed and reviewed in the chart.     Iva Boop, MD,FACG  Electronically Signed    CEG/MedQ  DD: 12/10/2006  DT: 12/10/2006  Job #: [id number]   cc:   Stacie Glaze, MD

## 2010-12-27 NOTE — Assessment & Plan Note (Signed)
[facility]                         GASTROENTEROLOGY OFFICE NOTE   NAME:[name], [name]                       MRN:          [id number]  DATE:11/10/2006                            DOB:          1940/10/21    REFERRING PHYSICIAN:  Stacie Glaze, MD   REASON FOR CONSULTATION:  Diverticulitis.   ASSESSMENT:  1. Recent clinical syndrome of abdominal pain, crampy, treated with      Cipro for presumptive diverticulitis.  2. She has had persistent change in her bowel habits with several      loose stools a day. She had some diarrhea symptoms initially.  3. There is also blood in the stool, she had some bright red blood but      she also had some plum or red wine colored stool that has      persisted.  4. History of colon polyps on colonoscopy January 30, 2005 with serrated      adenoma of the ascending colon as well as diverticulosis and      external hemorrhoids.   RECOMMENDATIONS/PLAN:  She should have a colonoscopy again to  investigate the bleeding and change in bowel habits. She is overall  better but she has had these persistent symptoms with a history of  polyp, colon cancer should be excluded, she has a daughter with  ulcerative colitis and that should be looked for as well. She took some  antibiotics in January which could have triggered a C Dif infection and  we will see if that is present as well at the time of colonoscopy,  though I think unlikely it is within the realm of possibility. I have  explained the procedure and she understands and agrees to proceed.   HISTORY:  This is a 70 year old, white female that I know from previous  colonoscopy and caring for her husband (he died in 2023/07/20 with a new  diagnosis of stage 4 lung cancer). She was doing reasonably well until a  few weeks ago, I reviewed the notes from Dr. Lovell Sheehan. She was seen on  February 26 with occasional loose stools and red stools and crampy  abdominal pain as well. It was  lower. She tells me she had some diarrhea  but his notes indicate not. She was tender and he thought she might have  diverticulitis and she was given Cipro. She took that for 10 days. She  also had some hemorrhoids seen and was given some Proctocort  suppositories. She continues with the problems as outlined above in the  assessment with several loose stools a day usual over by midday. She  does not seem to have nocturnal symptoms. The abdominal pain is much  less if not gone. She still has a little bit of bright red blood per  rectum and she also has what she calls plum-colored bowel movements as  well. GI review of systems is otherwise negative from my medical history  form.   PAST MEDICAL HISTORY:  1. Colon polyp.  2. Diverticulosis.  3. Hemorrhoids.  4. Hypertension.  5. Depression.  6. Previous nephrolithiasis with surgery for  that in the past.  7. She has also had a bunionectomy.   FAMILY HISTORY:  Daughter with ulcerative colitis, father had lung  cancer.   SOCIAL HISTORY:  She is now widowed, 3 sons, 2 daughters. She is living  alone. No alcohol, tobacco or drugs.   REVIEW OF SYSTEMS:  Positive for allergy problems, all other systems  negative.   PHYSICAL EXAMINATION:  GENERAL:  Well-developed, petite, elderly, white  woman.  VITAL SIGNS:  Height 5 feet 1, weight 134 pounds, blood pressure 122/78,  pulse 72.  EYES:  Anicteric.  ENT:  Normal mouth and posterior pharynx.  NECK:  Supple with no thyromegaly or mass.  CHEST:  Clear.  HEART:  S1, S2, no murmurs, rubs or gallops.  ABDOMEN:  Soft, nontender, without organomegaly or mass. Bowel sounds  are present.  RECTAL:  Deferred.  LYMPHATIC:  No neck or supraclavicular nodes.  EXTREMITIES:  No peripheral edema in the lower extremities.  SKIN:  No acute rash. Warm and dry.  PSYCH:  She is alert and oriented x3.   I appreciate the opportunity to care for this patient. I reviewed the  old records in our chart as well  as the office notes sent by Dr.  Lovell Sheehan.     Courtney Boop, MD,FACG  Electronically Signed    CEG/MedQ  DD: 11/10/2006  DT: 11/10/2006  Job #: [id number]   cc:   Stacie Glaze, MD

## 2011-03-07 ENCOUNTER — Other Ambulatory Visit: Payer: Self-pay | Admitting: Internal Medicine

## 2011-03-28 ENCOUNTER — Other Ambulatory Visit: Payer: Self-pay | Admitting: Internal Medicine

## 2011-03-28 MED ORDER — ALPRAZOLAM 0.25 MG PO TABS: 0.2500 mg | ORAL_TABLET | Freq: Three times a day (TID) | ORAL | Status: DC | PRN

## 2011-03-28 NOTE — Telephone Encounter (Signed)
Pt called req a renewal of Alprazolam 0.25 mg to [facility] on [address].

## 2011-04-16 ENCOUNTER — Ambulatory Visit (INDEPENDENT_AMBULATORY_CARE_PROVIDER_SITE_OTHER): Payer: Medicare Other | Admitting: Family Medicine

## 2011-04-16 ENCOUNTER — Encounter: Payer: Self-pay | Admitting: Family Medicine

## 2011-04-16 VITALS — BP 148/78 | HR 82 | Temp 98.1°F | Wt 132.0 lb

## 2011-04-16 DIAGNOSIS — R109 Unspecified abdominal pain: Secondary | ICD-10-CM

## 2011-04-16 DIAGNOSIS — R1030 Lower abdominal pain, unspecified: Secondary | ICD-10-CM

## 2011-04-16 LAB — CBC WITH DIFFERENTIAL/PLATELET
Basophils Relative: 0.4 % (ref 0.0–3.0)
Eosinophils Relative: 0.6 % (ref 0.0–5.0)
HCT: 40.9 % (ref 36.0–46.0)
Hemoglobin: 13.7 g/dL (ref 12.0–15.0)
Lymphs Abs: 2.5 10*3/uL (ref 0.7–4.0)
Monocytes Relative: 6.6 % (ref 3.0–12.0)
Neutro Abs: 4.1 10*3/uL (ref 1.4–7.7)
RBC: 4.29 Mil/uL (ref 3.87–5.11)
WBC: 7.1 10*3/uL (ref 4.5–10.5)

## 2011-04-16 MED ORDER — METRONIDAZOLE 500 MG PO TABS: 500.0000 mg | ORAL_TABLET | Freq: Two times a day (BID) | ORAL | Status: AC

## 2011-04-16 MED ORDER — CIPROFLOXACIN HCL 500 MG PO TABS: 500.0000 mg | ORAL_TABLET | Freq: Two times a day (BID) | ORAL | Status: AC

## 2011-04-16 NOTE — Patient Instructions (Signed)
Follow up promptly for any fever, vomiting, or worsening pain Also be in touch if no better in 2-3 days.

## 2011-04-16 NOTE — Progress Notes (Signed)
  Subjective:    Patient ID: [name], female    DOB: Jul 05, 1941, 70 y.o.   MRN: [id number]  HPI Abdominal pain. Onset about 6 days ago. Location is lower abdomen bilateral.  Achy pain relatively constant. Severity is moderate. Generalized malaise. Denies any dysuria, fever, nausea, vomiting, or dysuria. No recent constipation or diarrhea. No bloody stools. Reported diverticular disease by colonoscopy about 4 years ago. We were unable to pull up on computer today. She denies prior history of diverticulitis.  No alleviating factors. No exacerbating factors  Past Medical History  Diagnosis Date  . HYPERLIPIDEMIA 09/22/2007  . DEPRESSION 03/11/2007  . 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 CERUMEN IMPACTION, BILATERAL 12/13/2007  . HYPERTENSION 03/11/2007  . HEMORRHOIDS, EXTERNAL W/O COMPLICATION 03/11/2007  . DIVERTICULOSIS, COLON 03/11/2007  . Rosacea 03/11/2007  . CERVICAL RADICULOPATHY 11/05/2009  . DYSPNEA ON EXERTION 03/14/2009  . Cough 03/14/2009  . WOUND, OPEN, ARM, WITHOUT COMPLICATION 07/11/2010  . ABRASION, ARM 10/20/2008  . UNS ADVRS EFF UNS RX MEDICINAL&BIOLOGICAL SBSTNC 11/05/2009   Past Surgical History  Procedure Date  . Kidney surgery     age 63  . Nephrectomy     kidney stones    reports that she has never smoked. She has never used smokeless tobacco. She reports that she does not drink alcohol or use illicit drugs. family history includes Cancer in her father and Mental illness in her mother. Allergies  Allergen Reactions  . Codeine     REACTION: nausia      Review of Systems  Constitutional: Negative for fever, chills, fatigue and unexpected weight change.  Respiratory: Negative for cough.   Cardiovascular: Negative for chest pain.  Gastrointestinal: Positive for abdominal pain. Negative for nausea, vomiting, diarrhea, constipation, blood in stool and abdominal distention.  Genitourinary: Negative for dysuria.  Skin: Negative for rash.         Objective:   Physical Exam  Constitutional: She is oriented to person, place, and time. She appears well-developed and well-nourished. No distress.  Cardiovascular: Normal rate, regular rhythm and normal heart sounds.   No murmur heard. Pulmonary/Chest: Effort normal and breath sounds normal. No respiratory distress. She has no wheezes. She has no rales.  Abdominal: Soft. She exhibits no distension and no mass. There is no rebound and no guarding.       Patient has some mild-to-moderate tenderness bilaterally left and right lower cautery. No guarding or rebound. No mass.  Musculoskeletal: She exhibits no edema.  Neurological: She is alert and oriented to person, place, and time.          Assessment & Plan:  Bilateral lower abdominal pain. Suspect acute diverticulitis. Check CBC. Start Cipro 500 mg twice daily for 10 days and Flagyl 500 mg twice a day for 10 days. CT scan if not improving over the next few days or for any worsening symptoms

## 2011-04-18 NOTE — Progress Notes (Signed)
Quick Note:  Attempt to call - no ans no mach ______

## 2011-04-22 NOTE — Progress Notes (Signed)
Quick Note:  Pt informed ______ 

## 2011-05-12 LAB — CBC
HCT: 40.9
MCV: 90.7
Platelets: 228
WBC: 7.2

## 2011-05-12 LAB — DIFFERENTIAL
Basophils Absolute: 0
Lymphocytes Relative: 28
Monocytes Absolute: 0.5
Neutro Abs: 4.7

## 2011-05-12 LAB — URINALYSIS, ROUTINE W REFLEX MICROSCOPIC
Leukocytes, UA: NEGATIVE
Nitrite: NEGATIVE
Protein, ur: NEGATIVE
Urobilinogen, UA: 0.2

## 2011-05-12 LAB — COMPREHENSIVE METABOLIC PANEL
Albumin: 4
BUN: 11
Chloride: 99
Creatinine, Ser: 0.74
GFR calc non Af Amer: >60
Total Bilirubin: 1.2

## 2011-05-12 LAB — PROTIME-INR: Prothrombin Time: 12.6

## 2011-05-12 LAB — URINE MICROSCOPIC-ADD ON

## 2011-06-04 ENCOUNTER — Other Ambulatory Visit: Payer: Self-pay | Admitting: Neurosurgery

## 2011-06-04 DIAGNOSIS — M47812 Spondylosis without myelopathy or radiculopathy, cervical region: Secondary | ICD-10-CM

## 2011-06-13 ENCOUNTER — Ambulatory Visit
Admission: RE | Admit: 2011-06-13 | Discharge: 2011-06-13 | Disposition: A | Discharge status: Home / Self Care | Payer: Medicare Other | Source: Ambulatory Visit | Attending: Neurosurgery | Admitting: Neurosurgery

## 2011-06-13 DIAGNOSIS — M47812 Spondylosis without myelopathy or radiculopathy, cervical region: Secondary | ICD-10-CM

## 2011-06-25 ENCOUNTER — Ambulatory Visit (INDEPENDENT_AMBULATORY_CARE_PROVIDER_SITE_OTHER): Payer: Medicare Other | Admitting: Internal Medicine

## 2011-06-25 DIAGNOSIS — Z23 Encounter for immunization: Secondary | ICD-10-CM

## 2011-06-27 ENCOUNTER — Ambulatory Visit: Payer: Medicare Other | Admitting: Internal Medicine

## 2011-06-29 DIAGNOSIS — H2510 Age-related nuclear cataract, unspecified eye: Secondary | ICD-10-CM | POA: Insufficient documentation

## 2011-06-30 ENCOUNTER — Other Ambulatory Visit: Payer: Self-pay | Admitting: Neurosurgery

## 2011-07-29 ENCOUNTER — Encounter (HOSPITAL_COMMUNITY): Payer: Self-pay

## 2011-08-06 ENCOUNTER — Other Ambulatory Visit: Payer: Self-pay | Admitting: Internal Medicine

## 2011-08-07 ENCOUNTER — Encounter (HOSPITAL_COMMUNITY)
Admission: RE | Admit: 2011-08-07 | Discharge: 2011-08-07 | Disposition: A | Discharge status: Home / Self Care | Payer: Medicare Other | Source: Ambulatory Visit | Attending: Neurosurgery | Admitting: Neurosurgery

## 2011-08-07 ENCOUNTER — Other Ambulatory Visit: Payer: Self-pay

## 2011-08-07 ENCOUNTER — Encounter (HOSPITAL_COMMUNITY): Payer: Self-pay

## 2011-08-07 ENCOUNTER — Ambulatory Visit (HOSPITAL_COMMUNITY)
Admission: RE | Admit: 2011-08-07 | Discharge: 2011-08-07 | Disposition: A | Discharge status: Home / Self Care | Payer: Medicare Other | Source: Ambulatory Visit | Attending: Anesthesiology | Admitting: Anesthesiology

## 2011-08-07 DIAGNOSIS — Z01812 Encounter for preprocedural laboratory examination: Secondary | ICD-10-CM | POA: Insufficient documentation

## 2011-08-07 DIAGNOSIS — Z01810 Encounter for preprocedural cardiovascular examination: Secondary | ICD-10-CM | POA: Insufficient documentation

## 2011-08-07 DIAGNOSIS — Z01818 Encounter for other preprocedural examination: Secondary | ICD-10-CM | POA: Insufficient documentation

## 2011-08-07 HISTORY — DX: Unspecified osteoarthritis, unspecified site: M19.90

## 2011-08-07 HISTORY — DX: Chronic kidney disease, unspecified: N18.9

## 2011-08-07 LAB — CBC
HCT: 43.6 % (ref 36.0–46.0)
MCV: 94.2 fL (ref 78.0–100.0)
RBC: 4.63 MIL/uL (ref 3.87–5.11)
RDW: 12.4 % (ref 11.5–15.5)
WBC: 8.9 10*3/uL (ref 4.0–10.5)

## 2011-08-07 LAB — BASIC METABOLIC PANEL
CO2: 29 mEq/L (ref 19–32)
Chloride: 99 mEq/L (ref 96–112)
Creatinine, Ser: 0.72 mg/dL (ref 0.50–1.10)

## 2011-08-07 NOTE — Pre-Procedure Instructions (Signed)
20 AHAANA ROCHETTE  08/07/2011   Your procedure is scheduled on:  Thursday, January 3  Report to [facility] at 5:30 AM.  Call this number if you have problems the morning of surgery: [phone]   Remember:   Do not eat food:After Midnight.  May have clear liquids: up to 4 Hours before arrival.  Clear liquids include soda, tea, black coffee, apple or grape juice, broth.  Take these medicines the morning of surgery with A SIP OF WATER: Xanax if needed, Lexapro, Niacin, eye drops   Do not wear jewelry, make-up or nail polish.  Do not wear lotions, powders, or perfumes. You may wear deodorant.  Do not shave 48 hours prior to surgery.  Do not bring valuables to the hospital.  Contacts, dentures or bridgework may not be worn into surgery.  Leave suitcase in the car. After surgery it may be brought to your room.  For patients admitted to the hospital, checkout time is 11:00 AM the day of discharge.   Patients discharged the day of surgery will not be allowed to drive home.  Name and phone number of your driver: NA  Special Instructions: CHG Shower Use Special Wash: 1/2 bottle night before surgery and 1/2 bottle morning of surgery.   Please read over the following fact sheets that you were given: Pain Booklet, Coughing and Deep Breathing and Surgical Site Infection Prevention

## 2011-08-12 DIAGNOSIS — I471 Supraventricular tachycardia, unspecified: Secondary | ICD-10-CM

## 2011-08-12 HISTORY — DX: Supraventricular tachycardia, unspecified: I47.10

## 2011-08-13 ENCOUNTER — Encounter: Payer: Self-pay | Admitting: Family

## 2011-08-13 ENCOUNTER — Ambulatory Visit (INDEPENDENT_AMBULATORY_CARE_PROVIDER_SITE_OTHER): Payer: Medicare Other | Admitting: Family

## 2011-08-13 DIAGNOSIS — J309 Allergic rhinitis, unspecified: Secondary | ICD-10-CM

## 2011-08-13 MED ORDER — CEFAZOLIN SODIUM-DEXTROSE 2-3 GM-% IV SOLR
2.0000 g | INTRAVENOUS | Status: AC
  Administered 2011-08-14: 2 g via INTRAVENOUS
  Filled 2011-08-13: qty 50

## 2011-08-13 NOTE — Patient Instructions (Signed)
1. Claritin or Zyrtec over-the-counter once a day.  2. There is no reason that you have to cancel your surgery from medical standpoint.  3. Follow with PCP postoperatively.

## 2011-08-13 NOTE — Progress Notes (Signed)
Subjective:    Patient ID: [name], female    DOB: 21-Aug-1940, 71 y.o.   MRN: [id number]  HPI 71 year old white female, nonsmoker, patient of Dr. Lovell Sheehan in with complaints of a cough and nasal congestion is warmer for one day. She has not taken anything over-the-counter. She feels fine. Denies any fever, muscle aches, chest pain, shortness of breath, nausea, or vomiting. She is due to have surgery tomorrow morning, and the surgeon wanted her to be cleared from a respiratory standpoint.   Review of Systems  Constitutional: Negative.   HENT: Positive for congestion and rhinorrhea.   Respiratory: Positive for cough.   Cardiovascular: Negative.   Gastrointestinal: Negative.   Genitourinary: Negative.   Musculoskeletal: Negative.   Neurological: Negative.   Hematological: Negative.   Psychiatric/Behavioral: Negative.    Past Medical History  Diagnosis Date  . HYPERLIPIDEMIA 09/22/2007  . DEPRESSION 03/11/2007  . 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 CERUMEN IMPACTION, BILATERAL 12/13/2007  . HYPERTENSION 03/11/2007  . DIVERTICULOSIS, COLON 03/11/2007  . Rosacea 03/11/2007  . CERVICAL RADICULOPATHY 11/05/2009  . UNS ADVRS EFF UNS RX MEDICINAL&BIOLOGICAL SBSTNC 11/05/2009  . Difficult intubation     pt reports severe throat pain after surgery  . Chronic kidney disease     kidney stones  . Arthritis     History   Social History  . Marital Status: Widowed    Spouse Name: N/A    Number of Children: N/A  . Years of Education: N/A   Occupational History  . Not on file.   Social History Main Topics  . Smoking status: Never Smoker   . Smokeless tobacco: Never Used  . Alcohol Use: No  . Drug Use: No  . Sexually Active: Not on file   Other Topics Concern  . Not on file   Social History Narrative  . No narrative on file    Past Surgical History  Procedure Date  . Kidney surgery     age 57  . Cervical fusion     C4-6  . Tonsillectomy   .  Colonoscopy     Family History  Problem Relation Age of Onset  . Mental illness Mother     comitted suicide  . Cancer Father     lung, smoker    Allergies  Allergen Reactions  . Codeine Nausea Only    Current Outpatient Prescriptions on File Prior to Visit  Medication Sig Dispense Refill  . ALPRAZolam (XANAX) 0.25 MG tablet Take 0.25 mg by mouth 3 (three) times daily as needed. For anxiety.       . AVALIDE 300-12.5 MG per tablet TAKE ONE TABLET BY MOUTH DAILY.  90 each  0  . Calcium Carbonate-Vitamin D (CALCIUM + D PO) Take 1 tablet by mouth daily.        . Flaxseed, Linseed, (FLAXSEED OIL PO) Take 1 tablet by mouth daily.        [name] LEXAPRO 20 MG tablet TAKE ONE TABLET BY MOUTH EVERY DAY  90 each  3  . niacin 500 MG tablet Take 500 mg by mouth daily with breakfast.        . travoprost, benzalkonium, (TRAVATAN) 0.004 % ophthalmic solution Place 1 drop into both eyes at bedtime.        . Multiple Vitamins-Minerals (ICAPS) CAPS Take 1 capsule by mouth daily.          BP 130/80  Pulse 82  Wt 131 lb (59.421 kg)chart  Objective:   Physical Exam  Constitutional: She is oriented to person, place, and time. She appears well-developed.  HENT:  Right Ear: External ear normal.  Nose: Nose normal.  Mouth/Throat: Oropharynx is clear and moist.  Eyes: Conjunctivae are normal.  Neck: Normal range of motion. Neck supple.  Cardiovascular: Normal rate, regular rhythm and normal heart sounds.   Pulmonary/Chest: Effort normal and breath sounds normal.  Musculoskeletal: Normal range of motion.  Neurological: She is alert and oriented to person, place, and time.  Skin: Skin is warm and dry.  Psychiatric: She has a normal mood and affect.          Assessment & Plan:  Assessment: Allergic rhinitis  Plan: Over-the-counter Claritin or Zyrtec once a day. May proceed with surgery as scheduled tomorrow. Follow up PCP postoperatively.

## 2011-08-14 ENCOUNTER — Encounter (HOSPITAL_COMMUNITY): Payer: Self-pay | Admitting: Anesthesiology

## 2011-08-14 ENCOUNTER — Encounter (HOSPITAL_COMMUNITY): Payer: Self-pay | Admitting: *Deleted

## 2011-08-14 ENCOUNTER — Encounter (HOSPITAL_COMMUNITY)
Admission: RE | Disposition: A | Discharge status: Home / Self Care | Payer: Self-pay | Source: Ambulatory Visit | Attending: Neurosurgery

## 2011-08-14 ENCOUNTER — Inpatient Hospital Stay (HOSPITAL_COMMUNITY)
Admission: RE | Admit: 2011-08-14 | Discharge: 2011-08-16 | DRG: 473 | Disposition: A | Discharge status: Home / Self Care | Payer: Medicare Other | Source: Ambulatory Visit | Attending: Neurosurgery | Admitting: Neurosurgery

## 2011-08-14 ENCOUNTER — Inpatient Hospital Stay (HOSPITAL_COMMUNITY): Payer: Medicare Other | Admitting: Anesthesiology

## 2011-08-14 ENCOUNTER — Other Ambulatory Visit: Payer: Self-pay

## 2011-08-14 ENCOUNTER — Inpatient Hospital Stay (HOSPITAL_COMMUNITY): Payer: Medicare Other

## 2011-08-14 DIAGNOSIS — Z79899 Other long term (current) drug therapy: Secondary | ICD-10-CM

## 2011-08-14 DIAGNOSIS — I129 Hypertensive chronic kidney disease with stage 1 through stage 4 chronic kidney disease, or unspecified chronic kidney disease: Secondary | ICD-10-CM | POA: Diagnosis present

## 2011-08-14 DIAGNOSIS — N189 Chronic kidney disease, unspecified: Secondary | ICD-10-CM | POA: Diagnosis present

## 2011-08-14 DIAGNOSIS — I498 Other specified cardiac arrhythmias: Secondary | ICD-10-CM | POA: Diagnosis not present

## 2011-08-14 DIAGNOSIS — H409 Unspecified glaucoma: Secondary | ICD-10-CM | POA: Diagnosis present

## 2011-08-14 DIAGNOSIS — Z472 Encounter for removal of internal fixation device: Secondary | ICD-10-CM

## 2011-08-14 DIAGNOSIS — Z8679 Personal history of other diseases of the circulatory system: Secondary | ICD-10-CM

## 2011-08-14 DIAGNOSIS — F3289 Other specified depressive episodes: Secondary | ICD-10-CM | POA: Diagnosis present

## 2011-08-14 DIAGNOSIS — M47812 Spondylosis without myelopathy or radiculopathy, cervical region: Principal | ICD-10-CM | POA: Diagnosis present

## 2011-08-14 DIAGNOSIS — I471 Supraventricular tachycardia: Secondary | ICD-10-CM

## 2011-08-14 DIAGNOSIS — M5020 Other cervical disc displacement, unspecified cervical region: Secondary | ICD-10-CM | POA: Diagnosis present

## 2011-08-14 DIAGNOSIS — E785 Hyperlipidemia, unspecified: Secondary | ICD-10-CM | POA: Diagnosis present

## 2011-08-14 DIAGNOSIS — F329 Major depressive disorder, single episode, unspecified: Secondary | ICD-10-CM | POA: Diagnosis present

## 2011-08-14 DIAGNOSIS — I10 Essential (primary) hypertension: Secondary | ICD-10-CM

## 2011-08-14 HISTORY — PX: ANTERIOR CERVICAL DECOMP/DISCECTOMY FUSION: SHX1161

## 2011-08-14 SURGERY — ANTERIOR CERVICAL DECOMPRESSION/DISCECTOMY FUSION 1 LEVEL/HARDWARE REMOVAL
Anesthesia: General | Wound class: Clean

## 2011-08-14 MED ORDER — SODIUM CHLORIDE 0.9 % IJ SOLN: 3.0000 mL | INTRAMUSCULAR | Status: DC | PRN

## 2011-08-14 MED ORDER — OXYCODONE-ACETAMINOPHEN 5-325 MG PO TABS
1.0000 | ORAL_TABLET | ORAL | Status: DC | PRN
  Administered 2011-08-15: 1 via ORAL
  Filled 2011-08-14: qty 2
  Filled 2011-08-14: qty 1

## 2011-08-14 MED ORDER — DOCUSATE SODIUM 100 MG PO CAPS
100.0000 mg | ORAL_CAPSULE | Freq: Two times a day (BID) | ORAL | Status: DC
  Administered 2011-08-15 – 2011-08-16 (×3): 100 mg via ORAL
  Filled 2011-08-14 (×5): qty 1

## 2011-08-14 MED ORDER — DEXTROSE 5 % IV SOLN
INTRAVENOUS | Status: DC | PRN
  Administered 2011-08-14 (×2): via INTRAVENOUS

## 2011-08-14 MED ORDER — 0.9 % SODIUM CHLORIDE (POUR BTL) OPTIME
TOPICAL | Status: DC | PRN
  Administered 2011-08-14: 1000 mL

## 2011-08-14 MED ORDER — MENTHOL 3 MG MT LOZG: 1.0000 | LOZENGE | OROMUCOSAL | Status: DC | PRN

## 2011-08-14 MED ORDER — ZOLPIDEM TARTRATE 5 MG PO TABS
5.0000 mg | ORAL_TABLET | Freq: Every evening | ORAL | Status: DC | PRN
  Administered 2011-08-14 – 2011-08-15 (×2): 5 mg via ORAL
  Filled 2011-08-14 (×2): qty 1

## 2011-08-14 MED ORDER — THROMBIN 5000 UNITS EX SOLR
CUTANEOUS | Status: DC | PRN
  Administered 2011-08-14 (×2): 5000 [IU] via TOPICAL

## 2011-08-14 MED ORDER — DILTIAZEM HCL 30 MG PO TABS
30.0000 mg | ORAL_TABLET | ORAL | Status: DC | PRN
  Filled 2011-08-14: qty 1

## 2011-08-14 MED ORDER — ONDANSETRON HCL 4 MG/2ML IJ SOLN
INTRAMUSCULAR | Status: DC | PRN
  Administered 2011-08-14: 4 mg via INTRAVENOUS

## 2011-08-14 MED ORDER — SODIUM CHLORIDE 0.9 % IJ SOLN
3.0000 mL | Freq: Two times a day (BID) | INTRAMUSCULAR | Status: DC
  Administered 2011-08-14 – 2011-08-15 (×3): 3 mL via INTRAVENOUS

## 2011-08-14 MED ORDER — GLYCOPYRROLATE 0.2 MG/ML IJ SOLN
INTRAMUSCULAR | Status: DC | PRN
  Administered 2011-08-14: .4 mg via INTRAVENOUS

## 2011-08-14 MED ORDER — DILTIAZEM HCL 60 MG PO TABS
60.0000 mg | ORAL_TABLET | Freq: Four times a day (QID) | ORAL | Status: DC
  Administered 2011-08-14 – 2011-08-15 (×2): 60 mg via ORAL
  Filled 2011-08-14 (×7): qty 1

## 2011-08-14 MED ORDER — LIDOCAINE-EPINEPHRINE 1 %-1:100000 IJ SOLN
INTRAMUSCULAR | Status: DC | PRN
  Administered 2011-08-14: 20 mL

## 2011-08-14 MED ORDER — HYDROCODONE-ACETAMINOPHEN 5-325 MG PO TABS
1.0000 | ORAL_TABLET | ORAL | Status: DC | PRN
  Administered 2011-08-15 – 2011-08-16 (×3): 1 via ORAL
  Filled 2011-08-14 (×4): qty 1

## 2011-08-14 MED ORDER — ACETAMINOPHEN 325 MG PO TABS: 650.0000 mg | ORAL_TABLET | ORAL | Status: DC | PRN

## 2011-08-14 MED ORDER — KCL IN DEXTROSE-NACL 20-5-0.45 MEQ/L-%-% IV SOLN
INTRAVENOUS | Status: DC
  Administered 2011-08-14: 14:00:00 via INTRAVENOUS
  Filled 2011-08-14 (×5): qty 1000

## 2011-08-14 MED ORDER — BUPIVACAINE HCL (PF) 0.25 % IJ SOLN
INTRAMUSCULAR | Status: DC | PRN
  Administered 2011-08-14: 20 mL

## 2011-08-14 MED ORDER — PROMETHAZINE HCL 25 MG/ML IJ SOLN: 6.2500 mg | INTRAMUSCULAR | Status: DC | PRN

## 2011-08-14 MED ORDER — LACTATED RINGERS IV SOLN
INTRAVENOUS | Status: DC | PRN
  Administered 2011-08-14 (×2): via INTRAVENOUS

## 2011-08-14 MED ORDER — HYDROMORPHONE HCL PF 1 MG/ML IJ SOLN: 0.2500 mg | INTRAMUSCULAR | Status: DC | PRN

## 2011-08-14 MED ORDER — METHOCARBAMOL 500 MG PO TABS
500.0000 mg | ORAL_TABLET | Freq: Four times a day (QID) | ORAL | Status: DC | PRN
  Filled 2011-08-14: qty 1

## 2011-08-14 MED ORDER — ACETAMINOPHEN 10 MG/ML IV SOLN
INTRAVENOUS | Status: DC | PRN
  Administered 2011-08-14: 1000 mg via INTRAVENOUS

## 2011-08-14 MED ORDER — ESCITALOPRAM OXALATE 20 MG PO TABS
20.0000 mg | ORAL_TABLET | Freq: Every day | ORAL | Status: DC
  Administered 2011-08-15 – 2011-08-16 (×2): 20 mg via ORAL
  Filled 2011-08-14 (×3): qty 1

## 2011-08-14 MED ORDER — SODIUM CHLORIDE 0.9 % IV SOLN: 250.0000 mL | INTRAVENOUS | Status: DC

## 2011-08-14 MED ORDER — MIDAZOLAM HCL 2 MG/2ML IJ SOLN: 1.0000 mg | INTRAMUSCULAR | Status: DC | PRN

## 2011-08-14 MED ORDER — LIDOCAINE HCL (CARDIAC) 20 MG/ML IV SOLN
INTRAVENOUS | Status: DC | PRN
  Administered 2011-08-14: 100 mg via INTRAVENOUS

## 2011-08-14 MED ORDER — LORAZEPAM 2 MG/ML IJ SOLN: 1.0000 mg | Freq: Once | INTRAMUSCULAR | Status: DC | PRN

## 2011-08-14 MED ORDER — EPHEDRINE SULFATE 50 MG/ML IJ SOLN
INTRAMUSCULAR | Status: DC | PRN
  Administered 2011-08-14: 15 mg via INTRAVENOUS

## 2011-08-14 MED ORDER — FENTANYL CITRATE 0.05 MG/ML IJ SOLN
INTRAMUSCULAR | Status: DC | PRN
  Administered 2011-08-14: 50 ug via INTRAVENOUS
  Administered 2011-08-14: 100 ug via INTRAVENOUS

## 2011-08-14 MED ORDER — SODIUM CHLORIDE 0.9 % IV SOLN
INTRAVENOUS | Status: AC
  Filled 2011-08-14: qty 500

## 2011-08-14 MED ORDER — ROCURONIUM BROMIDE 100 MG/10ML IV SOLN
INTRAVENOUS | Status: DC | PRN
  Administered 2011-08-14: 50 mg via INTRAVENOUS

## 2011-08-14 MED ORDER — DEXAMETHASONE SODIUM PHOSPHATE 4 MG/ML IJ SOLN
INTRAMUSCULAR | Status: DC | PRN
  Administered 2011-08-14: 8 mg via INTRAVENOUS

## 2011-08-14 MED ORDER — TRAVOPROST 0.004 % OP SOLN
1.0000 [drp] | Freq: Every day | OPHTHALMIC | Status: DC
  Administered 2011-08-14 – 2011-08-15 (×2): 1 [drp] via OPHTHALMIC
  Filled 2011-08-14 (×4): qty 0.1

## 2011-08-14 MED ORDER — PROPOFOL 10 MG/ML IV EMUL
INTRAVENOUS | Status: DC | PRN
  Administered 2011-08-14: 70 mg via INTRAVENOUS
  Administered 2011-08-14: 120 mg via INTRAVENOUS

## 2011-08-14 MED ORDER — CEFAZOLIN SODIUM 1-5 GM-% IV SOLN
1.0000 g | Freq: Three times a day (TID) | INTRAVENOUS | Status: AC
  Administered 2011-08-14 – 2011-08-15 (×2): 1 g via INTRAVENOUS
  Filled 2011-08-14 (×3): qty 50

## 2011-08-14 MED ORDER — ONDANSETRON HCL 4 MG/2ML IJ SOLN: 4.0000 mg | INTRAMUSCULAR | Status: DC | PRN

## 2011-08-14 MED ORDER — NEOSTIGMINE METHYLSULFATE 1 MG/ML IJ SOLN
INTRAMUSCULAR | Status: DC | PRN
  Administered 2011-08-14: 3 mg via INTRAVENOUS

## 2011-08-14 MED ORDER — ALPRAZOLAM 0.25 MG PO TABS
0.2500 mg | ORAL_TABLET | Freq: Three times a day (TID) | ORAL | Status: DC | PRN
  Administered 2011-08-14: 0.25 mg via ORAL
  Filled 2011-08-14: qty 1

## 2011-08-14 MED ORDER — FENTANYL CITRATE 0.05 MG/ML IJ SOLN: 50.0000 ug | INTRAMUSCULAR | Status: DC | PRN

## 2011-08-14 MED ORDER — MIDAZOLAM HCL 5 MG/5ML IJ SOLN
INTRAMUSCULAR | Status: DC | PRN
  Administered 2011-08-14: 2 mg via INTRAVENOUS

## 2011-08-14 MED ORDER — ACETAMINOPHEN 650 MG RE SUPP: 650.0000 mg | RECTAL | Status: DC | PRN

## 2011-08-14 MED ORDER — PHENOL 1.4 % MT LIQD: 1.0000 | OROMUCOSAL | Status: DC | PRN

## 2011-08-14 MED ORDER — PHENYLEPHRINE HCL 10 MG/ML IJ SOLN
INTRAMUSCULAR | Status: DC | PRN
  Administered 2011-08-14: 120 ug via INTRAVENOUS

## 2011-08-14 MED ORDER — MORPHINE SULFATE 4 MG/ML IJ SOLN
1.0000 mg | INTRAMUSCULAR | Status: DC | PRN
  Administered 2011-08-14 (×2): 4 mg via INTRAVENOUS
  Filled 2011-08-14 (×2): qty 1

## 2011-08-14 MED ORDER — METHOCARBAMOL 100 MG/ML IJ SOLN
500.0000 mg | Freq: Four times a day (QID) | INTRAVENOUS | Status: DC | PRN
  Filled 2011-08-14: qty 5

## 2011-08-14 MED ORDER — BACITRACIN 50000 UNITS IM SOLR
INTRAMUSCULAR | Status: AC
  Filled 2011-08-14: qty 50000

## 2011-08-14 MED ORDER — NIACIN 500 MG PO TABS
500.0000 mg | ORAL_TABLET | Freq: Every day | ORAL | Status: DC
  Administered 2011-08-15 – 2011-08-16 (×2): 500 mg via ORAL
  Filled 2011-08-14 (×3): qty 1

## 2011-08-14 MED ORDER — SODIUM CHLORIDE 0.9 % IR SOLN
Status: DC | PRN
  Administered 2011-08-14: 11:00:00

## 2011-08-14 SURGICAL SUPPLY — 79 items
ADH SKN CLS APL DERMABOND .7 (GAUZE/BANDAGES/DRESSINGS) ×1
APL SKNCLS STERI-STRIP NONHPOA (GAUZE/BANDAGES/DRESSINGS)
BAG DECANTER FOR FLEXI CONT (MISCELLANEOUS) ×3 IMPLANT
BANDAGE GAUZE ELAST BULKY 4 IN (GAUZE/BANDAGES/DRESSINGS) ×2 IMPLANT
BENZOIN TINCTURE PRP APPL 2/3 (GAUZE/BANDAGES/DRESSINGS) IMPLANT
BIT DRILL 2.3 12 FIXED (INSTRUMENTS) IMPLANT
BIT DRILL NEURO 2X3.1 SFT TUCH (MISCELLANEOUS) ×1 IMPLANT
BLADE SURG 15 STRL LF DISP TIS (BLADE) IMPLANT
BLADE SURG 15 STRL SS (BLADE) ×4
BLADE ULTRA TIP 2M (BLADE) ×1 IMPLANT
BUR BARREL STRAIGHT FLUTE 4.0 (BURR) ×2 IMPLANT
CAGE CERVICAL 8 (Cage) ×2 IMPLANT
CANISTER SUCTION 2500CC (MISCELLANEOUS) ×2 IMPLANT
CLOTH BEACON ORANGE TIMEOUT ST (SAFETY) ×2 IMPLANT
CONT SPEC 4OZ CLIKSEAL STRL BL (MISCELLANEOUS) ×2 IMPLANT
COVER MAYO STAND STRL (DRAPES) ×2 IMPLANT
DERMABOND ADVANCED (GAUZE/BANDAGES/DRESSINGS) ×1
DERMABOND ADVANCED .7 DNX12 (GAUZE/BANDAGES/DRESSINGS) ×1 IMPLANT
DRAPE LAPAROTOMY 100X72 PEDS (DRAPES) ×2 IMPLANT
DRAPE MICROSCOPE LEICA (MISCELLANEOUS) ×2 IMPLANT
DRAPE POUCH INSTRU U-SHP 10X18 (DRAPES) ×2 IMPLANT
DRAPE PROXIMA HALF (DRAPES) IMPLANT
DRESSING TELFA 8X3 (GAUZE/BANDAGES/DRESSINGS) IMPLANT
DRILL 12MM (INSTRUMENTS) ×2
DRILL NEURO 2X3.1 SOFT TOUCH (MISCELLANEOUS) ×2
DURAPREP 6ML APPLICATOR 50/CS (WOUND CARE) ×2 IMPLANT
ELECT COATED BLADE 2.86 ST (ELECTRODE) ×2 IMPLANT
ELECT REM PT RETURN 9FT ADLT (ELECTROSURGICAL) ×2
ELECTRODE REM PT RTRN 9FT ADLT (ELECTROSURGICAL) ×1 IMPLANT
GAUZE SPONGE 4X4 16PLY XRAY LF (GAUZE/BANDAGES/DRESSINGS) IMPLANT
GLOVE BIO SURGEON STRL SZ8 (GLOVE) ×2 IMPLANT
GLOVE BIOGEL PI IND STRL 7.0 (GLOVE) IMPLANT
GLOVE BIOGEL PI IND STRL 8 (GLOVE) ×1 IMPLANT
GLOVE BIOGEL PI IND STRL 8.5 (GLOVE) ×1 IMPLANT
GLOVE BIOGEL PI INDICATOR 7.0 (GLOVE) ×1
GLOVE BIOGEL PI INDICATOR 8 (GLOVE) ×1
GLOVE BIOGEL PI INDICATOR 8.5 (GLOVE) ×1
GLOVE ECLIPSE 7.5 STRL STRAW (GLOVE) ×2 IMPLANT
GLOVE EXAM NITRILE LRG STRL (GLOVE) IMPLANT
GLOVE EXAM NITRILE MD LF STRL (GLOVE) IMPLANT
GLOVE EXAM NITRILE XL STR (GLOVE) IMPLANT
GLOVE EXAM NITRILE XS STR PU (GLOVE) IMPLANT
GLOVE SURG SS PI 6.5 STRL IVOR (GLOVE) ×3 IMPLANT
GOWN BRE IMP SLV AUR LG STRL (GOWN DISPOSABLE) ×1 IMPLANT
GOWN BRE IMP SLV AUR XL STRL (GOWN DISPOSABLE) ×2 IMPLANT
GOWN STRL REIN 2XL LVL4 (GOWN DISPOSABLE) ×1 IMPLANT
HEAD HALTER (SOFTGOODS) ×2 IMPLANT
HEMOSTAT SURGICEL 2X14 (HEMOSTASIS) IMPLANT
KIT BASIN OR (CUSTOM PROCEDURE TRAY) ×2 IMPLANT
KIT ROOM TURNOVER OR (KITS) ×2 IMPLANT
NDL HYPO 18GX1.5 BLUNT FILL (NEEDLE) ×1 IMPLANT
NDL HYPO 25X1 1.5 SAFETY (NEEDLE) ×1 IMPLANT
NDL SPNL 22GX3.5 QUINCKE BK (NEEDLE) ×1 IMPLANT
NEEDLE HYPO 18GX1.5 BLUNT FILL (NEEDLE) IMPLANT
NEEDLE HYPO 25X1 1.5 SAFETY (NEEDLE) ×2 IMPLANT
NEEDLE SPNL 22GX3.5 QUINCKE BK (NEEDLE) ×2 IMPLANT
NS IRRIG 1000ML POUR BTL (IV SOLUTION) ×2 IMPLANT
PACK LAMINECTOMY NEURO (CUSTOM PROCEDURE TRAY) ×2 IMPLANT
PAD ARMBOARD 7.5X6 YLW CONV (MISCELLANEOUS) ×2 IMPLANT
PIN DISTRACTION 14MM (PIN) ×4 IMPLANT
PLATE 14MM (Plate) ×1 IMPLANT
PROFUSE BONE SIZE 2 (Bone Implant) ×1 IMPLANT
PUREGEN 0.5ML SML (Bone Implant) ×1 IMPLANT
RUBBERBAND STERILE (MISCELLANEOUS) ×4 IMPLANT
SCREW 12MM (Screw) ×4 IMPLANT
SPACER SPNL 7D LRG 16X14X8XNS (Cage) IMPLANT
SPCR SPNL 7D LRG 16X14X8XNS (Cage) ×1 IMPLANT
SPONGE GAUZE 4X4 12PLY (GAUZE/BANDAGES/DRESSINGS) IMPLANT
SPONGE INTESTINAL PEANUT (DISPOSABLE) ×3 IMPLANT
SPONGE SURGIFOAM ABS GEL SZ50 (HEMOSTASIS) IMPLANT
STAPLER SKIN PROX WIDE 3.9 (STAPLE) IMPLANT
STRIP CLOSURE SKIN 1/2X4 (GAUZE/BANDAGES/DRESSINGS) IMPLANT
SUT VIC AB 3-0 SH 8-18 (SUTURE) ×3 IMPLANT
SYR 20ML ECCENTRIC (SYRINGE) ×2 IMPLANT
SYR 3ML LL SCALE MARK (SYRINGE) ×1 IMPLANT
TOWEL OR 17X24 6PK STRL BLUE (TOWEL DISPOSABLE) ×2 IMPLANT
TOWEL OR 17X26 10 PK STRL BLUE (TOWEL DISPOSABLE) ×2 IMPLANT
TRAP SPECIMEN MUCOUS 40CC (MISCELLANEOUS) ×1 IMPLANT
WATER STERILE IRR 1000ML POUR (IV SOLUTION) ×2 IMPLANT

## 2011-08-14 NOTE — Progress Notes (Signed)
Alerted that patient on having periods of tachycardia post-op.  Patient underwent ACD surgery today.  Reported to have periods of tachycardia in PACU now on 3000 with monitor showing periods of intermittent tachycardia. Patient resting in bed eating liquid dinner.  Alert and oriented. Skin w/d.  Cervical collar in place.  Clear quality to voice.  Incision clean and dry without signs of swelling.  Patient reports some left arm pain but the pain is the same as the pain she was having from her cervical spine issue prior to surgery.  Denies chest pain, SOB, n/v, no palpitations felt.  During course of 30 minutes spent on unit with patient she had increasing numbers of speeding and slowing.  Last episode lasting about 4 minutes.  Patient totally asx.  Bil BS clear.  In reviewing strips there were some periods of irregularity.  Some PAC's were noted - and possible short bursts Afib.  Unable to capture on 12 Lead EKG - rhythm back to SR before test completed.  Patient remains asx.  Spoke with Dr. Newell Coral - update given.  Orders given.  Patient and daughter updated - questions answered.  Cardiology consult per Dr. Newell Coral.  BP remained stable.

## 2011-08-14 NOTE — Progress Notes (Signed)
Patient doing well post op ACDF

## 2011-08-14 NOTE — Preoperative (Signed)
Beta Blockers   Reason not to administer Beta Blockers:Not Applicable 

## 2011-08-14 NOTE — Op Note (Signed)
08/14/2011  12:23 PM  PATIENT:  [name]  71 y.o. female  PRE-OPERATIVE DIAGNOSIS:  cervical spondylosis, HNP, Neck Pain, Radiculopathy  POST-OPERATIVE DIAGNOSIS:  cervical spondylosis, HNP, Neck Pain, Radiculopathy   PROCEDURE:  Procedure(s): ANTERIOR CERVICAL DECOMPRESSION/DISCECTOMY FUSION 1 LEVEL/HARDWARE REMOVAL, EXPLORATION OF FUSION  SURGEON:  Surgeon(s): Dorian Heckle, MD Cristi Loron  PHYSICIAN ASSISTANT:   ASSISTANTS: Poteat, RN   ANESTHESIA:   general  EBL:  Total I/O In: 1800 [I.V.:1800] Out: 25 [Blood:25]  BLOOD ADMINISTERED:none  DRAINS: none   LOCAL MEDICATIONS USED:  LIDOCAINE 8CC  SPECIMEN:  No Specimen  DISPOSITION OF SPECIMEN:  N/A  COUNTS:  YES  TOURNIQUET:  * No tourniquets in log *  DICTATION:Patient was brought to operating room and following the smooth and uncomplicated induction of general endotracheal anesthesia his head was placed on a horseshoe head holder he was placed in 5 pounds of Holter traction and his anterior neck was prepped and draped in usual sterile fashion. An incision was made on the left side of midline after infiltrating the skin and subcutaneous tissues with local lidocaine. The platysmal layer was incised and subplatysmal dissection was performed exposing the anterior border sternocleidomastoid muscle. Using blunt dissection the carotid sheath was kept lateral and trachea and esophagus kept medial exposing the anterior cervical spine and the previously placed hardware.  Meticulous dissection was performed to expose the previously placed plate, which was then removed. The bony surfaces and interfaces were carefully inspected and there appeared to be a solid arthrodesis at previously operated levels.Soft tissues were taken down overlying the C6/7 level and [place] retractor was placed. A bent  spinal needle was placed to be the C6-7 level and this was confirmed on intraoperative x-ray. Longus coli muscles were taken  down from the anterior cervical spine using electrocautery and key elevator and self-retaining retractor was placed. The interspace was incised and a thorough discectomy was performed. Distraction pins were placed. Uncinate spurs and central spondylitic ridges were drilled down with a high-speed drill. The spinal cord dura and both C7 nerve roots were widely decompressed. Hemostasis was assured. After trial sizing an 7mm peek interbody cage was selected and packed with profuse block and autograft. Tamped into position and countersunk appropriately. Distraction weight was removed. A 14 mm trestle luxe anterior cervical plate was affixed to the cervical spine with 12 mm variable-angle screws 2 at C6 and 2 at C7. All screws were well-positioned and locking mechanisms were engaged. Soft tissues were inspected and found to be in good repair. The wound was irrigated. The platysma layer was closed with 3-0 Vicryl stitches and the skin was reapproximated with 3-0 Vicryl subcuticular stitches. The wound was dressed with Dermabond. Counts were correct at the end of the case. Patient was extubated and taken to recovery in stable and satisfactory condition.  PLAN OF CARE: Admit for overnight observation  PATIENT DISPOSITION:  PACU - hemodynamically stable.   Delay start of Pharmacological VTE agent (>24hrs) due to surgical blood loss or risk of bleeding:  YES

## 2011-08-14 NOTE — Anesthesia Preprocedure Evaluation (Addendum)
Anesthesia Evaluation  Patient identified by MRN, date of birth, ID band Patient awake    Reviewed: Allergy & Precautions, H&P , NPO status , Patient's Chart, lab work & pertinent test results  History of Anesthesia Complications (+) DIFFICULT AIRWAY and AWARENESS UNDER ANESTHESIA  Airway Mallampati: II TM Distance: >3 FB Neck ROM: Limited  Mouth opening: Limited Mouth Opening  Dental  (+) Caps,    Pulmonary    Pulmonary exam normal       Cardiovascular hypertension, Pt. on medications Regular     Neuro/Psych Depression Left Arm "Aches all the time"  Neuromuscular disease    GI/Hepatic   Endo/Other    Renal/GU      Musculoskeletal  (+) Arthritis -, Osteoarthritis,    Abdominal   Peds  Hematology   Anesthesia Other Findings   Reproductive/Obstetrics                        Anesthesia Physical Anesthesia Plan  ASA: II  Anesthesia Plan: General   Post-op Pain Management:    Induction: Intravenous  Airway Management Planned: Oral ETT  Additional Equipment:   Intra-op Plan:   Post-operative Plan: Extubation in OR  Informed Consent: I have reviewed the patients History and Physical, chart, labs and discussed the procedure including the risks, benefits and alternatives for the proposed anesthesia with the patient or authorized representative who has indicated his/her understanding and acceptance.   Dental advisory given  Plan Discussed with: CRNA and Surgeon  Anesthesia Plan Comments:       Anesthesia Quick Evaluation

## 2011-08-14 NOTE — H&P (Signed)
  Date of Initial H&P: 08/13/2010  History reviewed, patient examined, no change in status, stable for surgery.

## 2011-08-14 NOTE — Anesthesia Postprocedure Evaluation (Signed)
  Anesthesia Post-op Note  Patient: [name]  Procedure(s) Performed:  ANTERIOR CERVICAL DECOMPRESSION/DISCECTOMY FUSION 1 LEVEL/HARDWARE REMOVAL - Cervical Six-Seven anterior cervical decompression with fusion interbody prothesis plating and bonegraft with removal of hardware of Cervical Four to Cervical Six   Patient Location: PACU  Anesthesia Type: General  Level of Consciousness: awake and alert   Airway and Oxygen Therapy: Patient Spontanous Breathing  Post-op Pain: mild  Post-op Assessment: Post-op Vital signs reviewed, Patient's Cardiovascular Status Stable, Respiratory Function Stable, Patent Airway, No signs of Nausea or vomiting and Pain level controlled  Post-op Vital Signs: stable  Complications: No apparent anesthesia complications

## 2011-08-14 NOTE — Transfer of Care (Signed)
Immediate Anesthesia Transfer of Care Note  Patient: [name]  Procedure(s) Performed:  ANTERIOR CERVICAL DECOMPRESSION/DISCECTOMY FUSION 1 LEVEL/HARDWARE REMOVAL - Cervical Six-Seven anterior cervical decompression with fusion interbody prothesis plating and bonegraft with removal of hardware of Cervical Four to Cervical Six   Patient Location: PACU  Anesthesia Type: General  Level of Consciousness: sedated and patient cooperative  Airway & Oxygen Therapy: Patient Spontanous Breathing and Patient connected to face mask oxygen  Post-op Assessment: Report given to PACU RN and Post -op Vital signs reviewed and stable  Post vital signs: Reviewed and stable  Complications: No apparent anesthesia complications

## 2011-08-14 NOTE — Progress Notes (Signed)
Subjective: Patient reports "I feel alright; just a little pain in this shoulder."  Objective: Vital signs in last 24 hours: Temp:  [98 F (36.7 C)-98.6 F (37 C)] 98 F (36.7 C) (01/03 1518) Pulse Rate:  [62-92] 92  (01/03 1518) Resp:  [14-18] 18  (01/03 1518) BP: (131-162)/(56-81) 160/81 mmHg (01/03 1518) SpO2:  [87 %-98 %] 95 % (01/03 1518)  Intake/Output from previous day:   Intake/Output this shift: Total I/O In: 1800 [I.V.:1800] Out: 25 [Blood:25]  Alert, conversant, family at bedside.  Reports some posterior left shoulder pain, less than pre-op.  HR has been periodically increasing to 140-150bpm, but quickly returning to normal per nursing. Pt asymptomatic.  Incision without errythema, swelling, or drainage.  Good strength BUE & hand intrinsics.   Lab Results: No results found for this basename: WBC:2,HGB:2,HCT:2,PLT:2 in the last 72 hours BMET No results found for this basename: NA:2,K:2,CL:2,CO2:2,GLUCOSE:2,BUN:2,CREATININE:2,CALCIUM:2 in the last 72 hours  Studies/Results: Dg Cervical Spine 2-3 Views  08/14/2011  *RADIOLOGY REPORT*  Clinical Data: C6-7 ACDF.  CERVICAL SPINE - 2-3 VIEW  Comparison: Plain films cervical spine 06/04/2011 and MRI cervical spine 06/13/2011.  Findings: We are provided with two-view intraoperative views of the cervical spine in the lateral projection.  On the first image, anterior plate and screws from C4-C6 have been removed.  There is localization of the C6-7 level. The second image shows interbody spacer in place at C6-7.  IMPRESSION:  1.  C6-7 ACDF. 2.  Removal of hardware C4-C6 without evidence of complication.  Original Report Authenticated By: Bernadene Bell. Maricela Curet, M.D.    Assessment/Plan: Improved.  LOS: 0 days  Will monitor HR. Plan d/c to home in am if no further issues with tachycardia.   Georgiann Cocker 08/14/2011, 3:37 PM

## 2011-08-14 NOTE — Consult Note (Signed)
CARDIOLOGY CONSULT NOTE  Patient ID: [name], MRN: [id number], DOB/AGE: Aug 17, 1940 71 y.o. Admit date: 08/14/2011 Date of Consult: 08/14/2011  Primary Physician: Carrie Mew, MD Primary Cardiologist: New to [facility] by Dr. Johney Frame  Chief Complaint: Planned cervical decompression/discectomy fusion for cervical spondylosis Reason for Consultation: Post-op tachycardia  HPI: 71 y.o. female w/ PMHx significant for HTN who presented to [facility] on 08/14/2011 for a scheduled anterior cervical decompression/discectomy fusion for cervical spondylosis. She tolerated the procedure without complication but developed intermittent tachycardia with heart rates in the 140s-150s post-op. These episodes come and go without intervention and she remains asymptomatic with stable vital signs.   She denies any history of irregular heart rhythms, palpitations or feeling her heart race, chest pain, dizziness, syncope, LE pain or edema. She is very active and able to walk 3-4 miles a day without any of the symptoms above.  Past Medical History  Diagnosis Date  . HYPERLIPIDEMIA 09/22/2007  . DEPRESSION 03/11/2007  . 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 CERUMEN IMPACTION, BILATERAL 12/13/2007  . HYPERTENSION 03/11/2007  . DIVERTICULOSIS, COLON 03/11/2007  . Rosacea 03/11/2007  . CERVICAL RADICULOPATHY 11/05/2009  . UNS ADVRS EFF UNS RX MEDICINAL&BIOLOGICAL SBSTNC 11/05/2009  . Difficult intubation     pt reports severe throat pain after surgery  . Arthritis   . Chronic kidney disease     kidney stones     Surgical History:  Procedure Date  . Kidney surgery     age 2  . Cervical fusion     C4-6  . Tonsillectomy   . Colonoscopy     Home Meds: Medication Sig Start Date End Date  AVALIDE 300-12.5 MG per tablet TAKE ONE TABLET BY MOUTH DAILY. 08/06/11   Calcium Carbonate-Vitamin D (CALCIUM + D PO) Take 1 tablet by mouth daily.      Flaxseed, Linseed, (FLAXSEED  OIL PO) Take 1 tablet by mouth daily.      LEXAPRO 20 MG tablet TAKE ONE TABLET BY MOUTH EVERY DAY 03/07/11   Multiple Vitamins-Minerals (ICAPS) CAPS Take 1 capsule by mouth daily.      niacin 500 MG tablet Take 500 mg by mouth daily with breakfast.      travoprost, benzalkonium, (TRAVATAN) 0.004 % ophthalmic solution Place 1 drop into both eyes at bedtime.      ALPRAZolam (XANAX) 0.25 MG tablet Take 0.25 mg by mouth 3 (three) times daily as needed. For anxiety.  03/28/11 03/27/12   Inpatient Medications:   . bacitracin      . ceFAZolin (ANCEF) IV  1 g Intravenous Q8H  . docusate sodium  100 mg Oral BID  . escitalopram  20 mg Oral Daily  . niacin  500 mg Oral Q breakfast  . travoprost (benzalkonium)  1 drop Both Eyes QHS   Allergies:  Allergen Reactions  . Codeine Nausea Only   Social History  . Marital Status: Widowed   Occupational History  . Retired   Social History Main Topics  . Smoking status: Never Smoker   . Smokeless tobacco: Never Used  . Alcohol Use: No  . Drug Use: No   Family History  Problem Relation Age of Onset  . Mental illness Mother     comitted suicide  . Cancer Father     lung, smoker   Review of Systems: General: negative for chills, fever, night sweats or weight changes.  Cardiovascular: negative for chest pain, shortness of breath dyspnea on exertion, edema, orthopnea,  palpitations, or paroxysmal nocturnal dyspnea Dermatological: negative for rash Respiratory: negative for cough or wheezing Urologic: negative for hematuria Abdominal: negative for nausea, vomiting, diarrhea, bright red blood per rectum, melena, or hematemesis Neurologic: negative for visual changes, syncope, or dizziness All other systems reviewed and are otherwise negative except as noted above.  Labs: TSH - pending  Radiology/Studies:   2D Echocardiogram - Pending  Dg Cervical Spine 2-3 Views 08/14/2011   Findings: We are provided with two-view intraoperative views of the  cervical spine in the lateral projection.  On the first image, anterior plate and screws from C4-C6 have been removed.  There is localization of the C6-7 level. The second image shows interbody spacer in place at C6-7.  IMPRESSION:  1.  C6-7 ACDF. 2.  Removal of hardware C4-C6 without evidence of complication.    EKG: 08/14/11 @ 1710 - Normal Sinus Rhythm, 82bpm, LAD, NO ST/T wave changes  Telemetry: Sinus rhythm with intermittent sinus/atrial tachycardia in the 140s  Physical Exam: Blood pressure 160/81, pulse 92, temperature 98 F (36.7 C), temperature source Oral, resp. rate 18, SpO2 95.00%. General: Well developed, well nourished, elderly white female in no acute distress. Head: Normocephalic, atraumatic, sclera non-icteric, no xanthomas, nares are without discharge.  Neck: Supple. Unable to assess for carotid bruits or JVD as she has a post-op collar inplace Lungs: Clear bilaterally to auscultation without wheezes, rales, or rhonchi. Breathing is unlabored. Heart: Tachycardic with S1 S2. No murmurs, rubs, or gallops appreciated. Abdomen: Soft, non-tender, non-distended with normoactive bowel sounds. No rebound/guarding. No obvious abdominal masses. Msk:  Strength and tone appear normal for age. Extremities: No clubbing or cyanosis. No edema.  Distal pedal pulses are 2+ and equal bilaterally. Neuro: Alert and oriented X 3. Moves all extremities spontaneously. Psych:  Responds to questions appropriately with a anxious affect.   Assessment and Plan:  71 y.o. female w/ PMHx significant for HTN who presented to [facility] on [facility]14[facility]2011 for a scheduled anterior cervical decompression/discectomy fusion for cervical spondylosis. She tolerated the procedure without complication but developed intermittent tachycardia with heart rates in the 140s-150s post-op.   1. Long RP SVT that at times degenerates to atrial tachycardia/atrial fibrillation. These episodes come and go without intervention  and she remains asymptomatic with stable vital signs. It is likely due to increased catecholamines from surgery. We will initiate Cardizem 60mg  Q6H PO with a PRN dose for sustained HR > 130s. We will also check a TSH and 2D echocardiogram.  2. HTN: SBPs 130s-160s. Cardizem as above will likely help bring it down. Cont home BP med when ok with surgery.   Signed, HOPE, JESSICA PA-C 08/14/2011, 6:03 PM  I have seen, examined the patient, and reviewed the above assessment and plan.  Pt with no prior h/o arrhythmia now with episodes of long RP SVT post operatively.  Likely atrial tachycardia upon review of telemetry.  I will therefore initiate PO diltiazem at this time.  IF her heart rates remain elevated despite diltiazem, we could consider a short course of flecainide.   Will follow with you.   Co Sign: Hillis Range, MD 08/14/2011 8:33 PM

## 2011-08-14 NOTE — Progress Notes (Signed)
Filed Vitals:   08/14/11 1500 08/14/11 1518 08/14/11 1817 08/14/11 1900  BP:  160/81 157/74 174/83  Pulse:  92 95 105  Temp: 98.4 F (36.9 C) 98 F (36.7 C) 97.7 F (36.5 C)   TempSrc:  Oral Oral   Resp:  18 20 19   SpO2:  95% 99% 97%     Called by Dyke Brackett from rapid response late this afternoon because of recurring episodes of tachycardia that would spontaneously resolved. Rate could be as high as at least 130 per Ms. Britt, and the cardiology consultation indicates rates as high as 140-150. Through all this the patient has apparently been asymptomatic. However I felt it was best to have her transferred to the intensive care unit for close monitoring and called Dr. Hillis Range from [facility] cardiology and requested a cardiology consultation.  Patient seen and examined. She is resting comfortably in bed without complaints. Current heart rate is 92. Incision is clean and dry there is no swelling, erythema, or drainage. Strength is 5 over 5 in the deltoid, biceps, triceps, intrinsics, and grip.  Dr. Johney Frame called me after he had seen the patient and felt that the tachycardia probably was an atrial tachycardia probably related to stress related to surgery and anesthesia. He is initiated treatment with Cardizem by mouth, and the patient will be seen in followup tomorrow by [facility] cardiology.   Plan: Continue supportive care in the ICU, progress to ambulation when stable from a cardiac perspective.

## 2011-08-14 NOTE — H&P (Signed)
  [name]  #[id number] DOB:  1941-08-11      [name] returns today to review her MRI which demonstrates left C7 nerve root compression and spondylosis at the C6-7 level.  We talked about treatment options and I recommended based on weakness which persists on today's examination and persistent pain that she undergo decompression and fusion at the C6-7 level with exploration of previous 4-6 fusion.  The previous hardware was reflex hybrid plate. I recommended that this be removed in conjunction with her new surgery.  She appears to have solid arthrodesis at the previously operated levels but I will assess this at the time of her surgery.    A prescription for Vicodin 5/325 1 q.6.h. as needed #60 was called in.  She will see how she does with pain medication and let us know when she wants to proceed with surgery.           Danae Orleans. Venetia Maxon, M.D./gde  NEUROSURGICAL CONSULTATION  [name]  #[id number]  DOB:  [birth year]     HISTORY:     [name] is a very nice 71 year old, right-handed, white lady [place] difficulty with pain down her proximal left arm.  She got some dysesthesias into her hand.  An MR was obtained.  It shows spondylosis and cord compression at C4-5 and C5-6.  She was offered an operation and asked to visit with me.    PAST MEDICAL HISTORY:  Remarkable for a little bit of hypertension.    MEDICATIONS:    She is on Avalide, Lexapro, Xalatan for glaucoma, Calcium, Flaxseed and Niacin.    DRUG ALLERGIES:   She gets sick with Codeine.    PAST SURGICAL HISTORY:  Kidney stones in 1960 and bunion in 1980.    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    FAMILY HISTORY:    Both parents are deceased.  There is a history of hypertension in the family.    REVIEW OF SYSTEMS:   Remarkable for glaucoma, hearing loss, hypertension, arm weakness and neck pain.    PHYSICAL EXAMINATION:  HEENT exam is within  normal limits.  She has good range of motion in her neck.  Chest is clear.  Cardiac exam is regular rate and rhythm.    NEUROLOGICAL EXAMINATION: She is awake, alert and oriented.  Cranial nerves are intact.  Motor exam shows 5/5 strength throughout the right upper extremity.  In the left upper extremity she has 5-/5 weakness of the biceps and triceps.  Sensory deficit described is in a left C6 distribution.  Reflexes are 2 at the right biceps and triceps, 1 at the right biceps, 2 at the right triceps, 1 at the brachioradialis.  Hoffmann's is negative.  Lower extremities are nonmyelopathic.    DIAGNOSTIC STUDIES:   She comes accompanied with an MR disc that shows spondylosis and cord compression at C4-5 and C5-6.    CLINICAL IMPRESSION:   Left C5 and C6 radiculopathies.    RECOMMENDATIONS:   The plan is for an anterior decompression and fusion at both C4-5 and C5-6.  We will plan on doing this September 29th.    [facility]    Payton Doughty, M.D., Ph.D.  MWR:sv cc: Dr. Venita Lick  Dr. Darryll Capers

## 2011-08-14 NOTE — OR Nursing (Signed)
Dr. Venetia Maxon removed Stryker plate and screws. Sent to processing to be sterilized to give to patient.

## 2011-08-14 NOTE — Anesthesia Procedure Notes (Signed)
Procedure Name: Intubation Date/Time: 08/14/2011 10:09 AM Performed by: Tyrone Nine Pre-anesthesia Checklist: Patient identified, Emergency Drugs available, Suction available and Patient being monitored Patient Re-evaluated:Patient Re-evaluated prior to inductionOxygen Delivery Method: Circle System Utilized Preoxygenation: Pre-oxygenation with 100% oxygen Intubation Type: IV induction Ventilation: Mask ventilation without difficulty Laryngoscope Size: Mac and 3 Grade View: Grade I Tube type: Oral Tube size: 7.5 mm Number of attempts: 1 (PT WAS NOT A DIFFICULT INTUBATION.  GRADE ONE VIEW. ) Airway Equipment and Method: stylet Placement Confirmation: ETT inserted through vocal cords under direct vision,  positive ETCO2 and breath sounds checked- equal and bilateral Secured at: 21 cm Tube secured with: Tape Dental Injury: Teeth and Oropharynx as per pre-operative assessment

## 2011-08-15 ENCOUNTER — Encounter (HOSPITAL_COMMUNITY): Payer: Self-pay | Admitting: Neurosurgery

## 2011-08-15 LAB — TSH: TSH: 0.355 u[IU]/mL (ref 0.350–4.500)

## 2011-08-15 MED ORDER — DILTIAZEM HCL ER COATED BEADS 240 MG PO CP24
240.0000 mg | ORAL_CAPSULE | Freq: Every day | ORAL | Status: DC
  Administered 2011-08-15 – 2011-08-16 (×2): 240 mg via ORAL
  Filled 2011-08-15 (×2): qty 1

## 2011-08-15 NOTE — Progress Notes (Signed)
Physical Therapy Evaluation Patient Details Name: [name] MRN: [id number] DOB: 14-Dec-1940 Today's Date: 08/15/2011  Problem List:  Patient Active Problem List  Diagnoses  . HYPERLIPIDEMIA  . DEPRESSION  . GLAUCOMA ASSOCIATED W/UNSPEC OCULAR DISORDER  . CERUMEN IMPACTION, BILATERAL  . HYPERTENSION  . HEMORRHOIDS, EXTERNAL W/O COMPLICATION  . SINUSITIS, ACUTE NEC  . DIVERTICULOSIS, COLON  . ROSACEA  . CERVICAL RADICULOPATHY  . DYSPNEA ON EXERTION  . COUGH  . WOUND, OPEN, ARM, WITHOUT COMPLICATION  . ABRASION, ARM  . UNS ADVRS EFF UNS RX MEDICINAL&BIOLOGICAL SBSTNC  . SVT (supraventricular tachycardia)    Past Medical History:  Past Medical History  Diagnosis Date  . HYPERLIPIDEMIA 09/22/2007  . DEPRESSION 03/11/2007  . 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 CERUMEN IMPACTION, BILATERAL 12/13/2007  . HYPERTENSION 03/11/2007  . DIVERTICULOSIS, COLON 03/11/2007  . Rosacea 03/11/2007  . CERVICAL RADICULOPATHY 11/05/2009  . UNS ADVRS EFF UNS RX MEDICINAL&BIOLOGICAL SBSTNC 11/05/2009  . Difficult intubation     pt reports severe throat pain after surgery  . Arthritis   . Chronic kidney disease     kidney stones   Past Surgical History:  Past Surgical History  Procedure Date  . Kidney surgery     age 69  . Cervical fusion     C4-6  . Tonsillectomy   . Colonoscopy     PT Assessment/Plan/Recommendation PT Assessment Clinical Impression Statement: pt presents s/p ACDF and is very motivated.  pt Independent with all mobility and educated on cervical precautions and lifting restrictions for home.  No further PT needs at this time.  May benefit from OPPT in future.   PT Recommendation/Assessment: Patent does not need any further PT services No Skilled PT: All education completed;Patient will have necessary level of assist by caregiver at discharge;Patient is independent with all acitivity/mobility PT Recommendation Follow Up Recommendations:  None Equipment Recommended: None recommended by PT PT Goals     PT Evaluation Precautions/Restrictions  Precautions Precautions:  (Cervical) Precaution Comments: Given cervical handout Required Braces or Orthoses: Yes Cervical Brace: Soft collar Restrictions Weight Bearing Restrictions: No Prior Functioning  Home Living Lives With: Alone Receives Help From: Family Type of Home: House Home Layout: One level (Laundry in basement) Home Access: Stairs to enter Entrance Stairs-Rails: Right Entrance Stairs-Number of Steps: 4 Bathroom Shower/Tub: Engineer, manufacturing systems: Standard Home Adaptive Equipment: None Prior Function Level of Independence: Independent with basic ADLs;Independent with homemaking with ambulation;Independent with gait;Independent with transfers Able to Take Stairs?: Reciprically Driving: Yes Vocation: Retired Leisure: Hobbies-yes (Comment) Comments: Walks 3-4 miles at least 5 days per week.   Cognition Cognition Orientation Level: Oriented X4 Sensation/Coordination   Extremity Assessment RLE Assessment RLE Assessment: Within Functional Limits LLE Assessment LLE Assessment: Within Functional Limits Mobility (including Balance) Bed Mobility Bed Mobility: Yes Rolling Left: 7: Independent Left Sidelying to Sit: 7: Independent Transfers Transfers: Yes Sit to Stand: 7: Independent Stand to Sit: 7: Independent Ambulation/Gait Ambulation/Gait: Yes Ambulation/Gait Assistance: 7: Independent Ambulation Distance (Feet): 300 Feet Assistive device: None Gait Pattern: Step-through pattern Stairs: No Wheelchair Mobility Wheelchair Mobility: No  Posture/Postural Control Posture/Postural Control: No significant limitations Exercise    End of Session PT - End of Session Equipment Utilized During Treatment: Gait belt;Cervical collar Activity Tolerance: Patient tolerated treatment well Patient left: in chair;with call bell in reach;with  family/visitor present Nurse Communication: Mobility status for ambulation General Behavior During Session: [facility] for tasks performed Cognition: [facility] for tasks performed  Dustan Hyams, Alison Murray, PT  [phone] 08/15/2011, 10:58 AM

## 2011-08-15 NOTE — Progress Notes (Signed)
Report given to Ryanne RN on 2000, to transfer to 2039 via wheelchair,NSR today HR in 60-80s even with activity on daily po Cardizem, Berle Mull Rn

## 2011-08-15 NOTE — Progress Notes (Signed)
SUBJECTIVE: The patient is doing well today.  At this time, she denies chest pain, shortness of breath, or any new concerns. Atach is much improved    . bacitracin      . ceFAZolin (ANCEF) IV  1 g Intravenous Q8H  . ceFAZolin (ANCEF) IV  2 g Intravenous 60 min Pre-Op  . diltiazem  60 mg Oral Q6H  . docusate sodium  100 mg Oral BID  . escitalopram  20 mg Oral Daily  . niacin  500 mg Oral Q breakfast  . sodium chloride      . sodium chloride  3 mL Intravenous Q12H  . travoprost (benzalkonium)  1 drop Both Eyes QHS      . sodium chloride    . dextrose 5 % and 0.45 % NaCl with KCl 20 mEq/L Stopped (08/15/11 0400)    OBJECTIVE: Physical Exam: Filed Vitals:   08/15/11 0400 08/15/11 0500 08/15/11 0600 08/15/11 0700  BP: 121/57 82/40 93/40  91/41  Pulse: 73 66 62 66  Temp:      TempSrc:      Resp: 14 13 14 14   SpO2: 98% 98% 99% 96%    Intake/Output Summary (Last 24 hours) at 08/15/11 0826 Last data filed at 08/14/11 2300  Gross per 24 hour  Intake   1850 ml  Output    525 ml  Net   1325 ml    Telemetry reveals sinus rhythm  GEN- The patient is well appearing, alert and oriented x 3 today.   Head- normocephalic, atraumatic Eyes-  Sclera clear, conjunctiva pink Ears- hearing intact Oropharynx- clear Lymph- no cervical lymphadenopathy Lungs- Clear to ausculation bilaterally, normal work of breathing Heart- Regular rate and rhythm, no murmurs, rubs or gallops, PMI not laterally displaced GI- soft, NT, ND, + BS Extremities- no clubbing, cyanosis, or edema Cervical collar in place  LABS: Basic Metabolic Panel: No results found for this basename: NA:2,K:2,CL:2,CO2:2,GLUCOSE:2,BUN:2,CREATININE:2,CALCIUM:2,MG:2,PHOS:2 in the last 72 hours Liver Function Tests: No results found for this basename: AST:2,ALT:2,ALKPHOS:2,BILITOT:2,PROT:2,ALBUMIN:2 in the last 72 hours No results found for this basename: LIPASE:2,AMYLASE:2 in the last 72 hours CBC: No results found for this  basename: WBC:2,NEUTROABS:2,HGB:2,HCT:2,MCV:2,PLT:2 in the last 72 hours Cardiac Enzymes: No results found for this basename: CKTOTAL:3,CKMB:3,CKMBINDEX:3,TROPONINI:3 in the last 72 hours BNP: No components found with this basename: POCBNP:3 D-Dimer: No results found for this basename: DDIMER:2 in the last 72 hours Hemoglobin A1C: No results found for this basename: HGBA1C in the last 72 hours Fasting Lipid Panel: No results found for this basename: CHOL,HDL,LDLCALC,TRIG,CHOLHDL,LDLDIRECT in the last 72 hours Thyroid Function Tests:  Basename 08/14/11 2133  TSH 0.355  T4TOTAL --  T3FREE --  THYROIDAB --   Anemia Panel: No results found for this basename: VITAMINB12,FOLATE,FERRITIN,TIBC,IRON,RETICCTPCT in the last 72 hours  RADIOLOGY: Dg Chest 2 View  08/07/2011  *RADIOLOGY REPORT*  Clinical Data: 71 year old female preoperative study for cervical surgery.  CHEST - 2 VIEW  Comparison: 05/08/2008.  Findings: Lower lung volumes on both views with crowding of lung markings at the bases.  Stable cardiac size at the upper limits of normal. Other mediastinal contours are within normal limits. Cervical ACDF hardware is new from the prior.  No pneumothorax, pulmonary edema, pleural effusion or consolidation. No acute osseous abnormality identified.  IMPRESSION: Low lung volumes, otherwise no acute cardiopulmonary abnormality.  Original Report Authenticated By: Harley Hallmark, M.D.   Dg Cervical Spine 2-3 Views  08/14/2011  *RADIOLOGY REPORT*  Clinical Data: C6-7 ACDF.  CERVICAL SPINE - 2-3  VIEW  Comparison: Plain films cervical spine 06/04/2011 and MRI cervical spine 06/13/2011.  Findings: We are provided with two-view intraoperative views of the cervical spine in the lateral projection.  On the first image, anterior plate and screws from C4-C6 have been removed.  There is localization of the C6-7 level. The second image shows interbody spacer in place at C6-7.  IMPRESSION:  1.  C6-7 ACDF. 2.   Removal of hardware C4-C6 without evidence of complication.  Original Report Authenticated By: Bernadene Bell. D'ALESSIO, M.D.    ASSESSMENT AND PLAN:  Active Problems:  SVT (supraventricular tachycardia)  1. SVT- likely atrial tachycardia now much improved Will convert to daily diltiazem Transfer to telemetry if ok with primary team  2. HTN- controlled with diltiazem Hold home meds  Would plan DC to home tomorrow from cards standpoint if rhythm remains stable Follow-up with me in the office in 1-2 weeks  Hillis Range, MD 08/15/2011 8:26 AM

## 2011-08-15 NOTE — Progress Notes (Signed)
Subjective: Patient reports "I feel alright. I haven't felt any different when they tell me my heart is fast."  Objective: Vital signs in last 24 hours: Temp:  [97.7 F (36.5 C)-98.6 F (37 C)] 97.9 F (36.6 C) (01/04 0900) Pulse Rate:  [62-105] 66  (01/04 0700) Resp:  [13-20] 14  (01/04 0700) BP: (82-174)/(40-83) 91/41 mmHg (01/04 0700) SpO2:  [87 %-99 %] 96 % (01/04 0700) Weight:  [57.153 kg (126 lb)] 126 lb (57.153 kg) (01/04 0730)  Intake/Output from previous day: 01/03 0701 - 01/04 0700 In: 1900 [I.V.:1800; IV Piggyback:100] Out: 525 [Urine:500; Blood:25] Intake/Output this shift: Total I/O In: 480 [P.O.:480] Out: 850 [Urine:850]  Alert, conversant. Good strength BUE and hand intrinsics. Incision with Dermabond. No erythema, swelling, or drainage. Denies pain at present.  Lab Results: No results found for this basename: WBC:2,HGB:2,HCT:2,PLT:2 in the last 72 hours BMET No results found for this basename: NA:2,K:2,CL:2,CO2:2,GLUCOSE:2,BUN:2,CREATININE:2,CALCIUM:2 in the last 72 hours  Studies/Results: Dg Cervical Spine 2-3 Views  08/14/2011  *RADIOLOGY REPORT*  Clinical Data: C6-7 ACDF.  CERVICAL SPINE - 2-3 VIEW  Comparison: Plain films cervical spine 06/04/2011 and MRI cervical spine 06/13/2011.  Findings: We are provided with two-view intraoperative views of the cervical spine in the lateral projection.  On the first image, anterior plate and screws from C4-C6 have been removed.  There is localization of the C6-7 level. The second image shows interbody spacer in place at C6-7.  IMPRESSION:  1.  C6-7 ACDF. 2.  Removal of hardware C4-C6 without evidence of complication.  Original Report Authenticated By: Bernadene Bell. Maricela Curet, M.D.    Assessment/Plan: Improved neurologically, stable & followed by cardiology for intermittent SVT. Thank you to Cardiology for following.  LOS: 1 day  Will transfer to Telemetry today. Plan d/c home when approved by cardiology. Home d/c  instructions were reviewed with pt this visit.   Georgiann Cocker 08/15/2011, 11:29 AM     Doing very well following ACDF with asymptomatic tachycardia.  Will observe with telemtry and D/C in AM if controlled with Diltiazem.  I appreciate Dr. Jenel Lucks help.

## 2011-08-15 NOTE — Progress Notes (Signed)
Clinical social worker received a consult for "SNF." PT/OT are recommending that pt discharge home. CSW met with pt, introduced herself and explained role of clinical social work. Pt lives with her adult son, and her daughters will be available to provide care as pt recovers from surgery. Pt reported that her daughter will be providing transportation home. Pt did not voice any social work concerns. CSW is signing off as no further clinical social work needs identified. Please reconsult if a need arises prior to discharge.   Dede Query, MSW, Theresia Majors [phone]

## 2011-08-15 NOTE — Progress Notes (Signed)
Occupational Therapy Evaluation Patient Details Name: [name] MRN: [id number] DOB: [birth year] Today's Date: 08/15/2011  Problem List:  Patient Active Problem List  Diagnoses  . HYPERLIPIDEMIA  . DEPRESSION  . GLAUCOMA ASSOCIATED W/UNSPEC OCULAR DISORDER  . CERUMEN IMPACTION, BILATERAL  . HYPERTENSION  . HEMORRHOIDS, EXTERNAL W/O COMPLICATION  . SINUSITIS, ACUTE NEC  . DIVERTICULOSIS, COLON  . ROSACEA  . CERVICAL RADICULOPATHY  . DYSPNEA ON EXERTION  . COUGH  . WOUND, OPEN, ARM, WITHOUT COMPLICATION  . ABRASION, ARM  . UNS ADVRS EFF UNS RX MEDICINAL&BIOLOGICAL SBSTNC  . SVT (supraventricular tachycardia)    Past Medical History:  Past Medical History  Diagnosis Date  . HYPERLIPIDEMIA 09/22/2007  . DEPRESSION 03/11/2007  . 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 CERUMEN IMPACTION, BILATERAL 12/13/2007  . HYPERTENSION 03/11/2007  . DIVERTICULOSIS, COLON 03/11/2007  . Rosacea 03/11/2007  . CERVICAL RADICULOPATHY 11/05/2009  . UNS ADVRS EFF UNS RX MEDICINAL&BIOLOGICAL SBSTNC 11/05/2009  . Difficult intubation     pt reports severe throat pain after surgery  . Arthritis   . Chronic kidney disease     kidney stones   Past Surgical History:  Past Surgical History  Procedure Date  . Kidney surgery     age 71  . Cervical fusion     C4-6  . Tonsillectomy   . Colonoscopy     OT Assessment/Plan/Recommendation OT Assessment OT Recommendation/Assessment: All further OT needs can be met in the next venue of care OT Recommendation Follow Up Recommendations: Other (comment) (follow up with MD) Equipment Recommended: None recommended by OT OT Goals    OT Evaluation Precautions/Restrictions  Precautions Precautions: Other (comment) (cervical) Precaution Comments: Given cervical handout Required Braces or Orthoses: Yes Cervical Brace: Soft collar Restrictions Weight Bearing Restrictions: No Prior Functioning Home Living Lives With: Alone Receives  Help From: Family Type of Home: House Home Layout: One level Home Access: Stairs to enter Entrance Stairs-Rails: Right Entrance Stairs-Number of Steps: 4 Bathroom Shower/Tub: Engineer, manufacturing systems: Standard Home Adaptive Equipment: None Prior Function Level of Independence: Independent with basic ADLs;Independent with homemaking with ambulation;Independent with gait;Independent with transfers Able to Take Stairs?: Reciprically Driving: Yes Vocation: Retired Leisure: Hobbies-yes (Comment) Comments: walks 3-4 miles at least 5 days per week ADL ADL Eating/Feeding: Performed;Independent Where Assessed - Eating/Feeding: Chair Grooming: Performed;Wash/dry hands;Modified independent Where Assessed - Grooming: Standing at sink Upper Body Bathing: Simulated;Chest;Right arm;Left arm;Abdomen;Modified independent Where Assessed - Upper Body Bathing: Sit to stand from chair Lower Body Bathing: Simulated;Modified independent Where Assessed - Lower Body Bathing: Sit to stand from chair Upper Body Dressing: Simulated;Modified independent Where Assessed - Upper Body Dressing: Sit to stand from chair Lower Body Dressing: Simulated;Modified independent Where Assessed - Lower Body Dressing: Sit to stand from chair Toilet Transfer: Performed;Modified independent Toilet Transfer Method: Stand pivot;Ambulating Toilet Transfer Equipment: Regular height toilet Toileting - Clothing Manipulation: Performed;Modified independent Where Assessed - Toileting Clothing Manipulation: Sit to stand from 3-in-1 or toilet Toileting - Hygiene: Performed;Modified independent Where Assessed - Toileting Hygiene: Sit to stand from 3-in-1 or toilet Vision/Perception  Vision - History Baseline Vision: Bifocals Patient Visual Report: No change from baseline Cognition Cognition Arousal/Alertness: Awake/alert Overall Cognitive Status: Appears within functional limits for tasks assessed Orientation Level:  Oriented X4 Sensation/Coordination Coordination Gross Motor Movements are Fluid and Coordinated: Yes Fine Motor Movements are Fluid and Coordinated: Yes Extremity Assessment RUE Assessment RUE Assessment: Not tested (not assessed >90 degrees due to cervical ) LUE Assessment LUE Assessment:  (not  assessed >90 degrees due to cervical ) Mobility  Bed Mobility Bed Mobility: Yes Rolling Left: 7: Independent Left Sidelying to Sit: 7: Independent Transfers Transfers: Yes Sit to Stand: 7: Independent Stand to Sit: 7: Independent Exercises   End of Session OT - End of Session Equipment Utilized During Treatment: Gait belt Activity Tolerance: Patient tolerated treatment well Patient left: in chair;with call bell in reach;with family/visitor present (youngest son) Nurse Communication: Mobility status for transfers;Mobility status for ambulation General Behavior During Session: [facility] for tasks performed Cognition: [facility] for tasks performed   [name] 08/15/2011, 11:37 AM  Pager: [phone]

## 2011-08-15 NOTE — Progress Notes (Signed)
OT DISCHARGE NOTE Patient is being discharged from PT/ OT/ SLP services secondary to:  Goals met and no further therapy needs identified.  Medical decline- will need to re-order to resume therapy services.  Surgery- will need to re-order to resume therapy services.  Patient has refused 3 consecutive times without medical reason.  Patient has made no progress toward goals in a reasonable time frame.  Please see latest Therapy Progress Note for current level of functioning and progress toward goals.  Progress and discharge plan and discussed with patient/caregiver and they  Agree Disagree No caregivers available Patient unable to participate in discharge planning   [name]   OTR/L Pager: [phone] Office: [phone] .

## 2011-08-16 DIAGNOSIS — I498 Other specified cardiac arrhythmias: Secondary | ICD-10-CM

## 2011-08-16 DIAGNOSIS — I10 Essential (primary) hypertension: Secondary | ICD-10-CM

## 2011-08-16 DIAGNOSIS — R012 Other cardiac sounds: Secondary | ICD-10-CM

## 2011-08-16 MED ORDER — DILTIAZEM HCL ER COATED BEADS 240 MG PO CP24: 240.0000 mg | ORAL_CAPSULE | Freq: Every day | ORAL | Status: DC

## 2011-08-16 MED ORDER — OXYCODONE-ACETAMINOPHEN 5-325 MG PO TABS: 1.0000 | ORAL_TABLET | ORAL | Status: AC | PRN

## 2011-08-16 NOTE — Progress Notes (Signed)
SUBJECTIVE: The patient is doing well today and wants to go home.  At this time, she denies chest pain, shortness of breath, or any new concerns. Atach is much improved    . diltiazem  240 mg Oral Daily  . docusate sodium  100 mg Oral BID  . escitalopram  20 mg Oral Daily  . niacin  500 mg Oral Q breakfast  . sodium chloride  3 mL Intravenous Q12H  . travoprost (benzalkonium)  1 drop Both Eyes QHS      . sodium chloride    . dextrose 5 % and 0.45 % NaCl with KCl 20 mEq/L Stopped (08/15/11 0400)    OBJECTIVE: Physical Exam: Filed Vitals:   08/15/11 2000 08/15/11 2053 08/16/11 0000 08/16/11 0407  BP: 135/66 120/64 143/72 128/69  Pulse: 68 66 68 67  Temp: 98.4 F (36.9 C) 97.3 F (36.3 C) 96.9 F (36.1 C) 96.9 F (36.1 C)  TempSrc: Oral Oral Oral Oral  Resp: 18 18 20 20   Height:      Weight:    129 lb 10.1 oz (58.8 kg)  SpO2: 93% 91% 95% 91%    Intake/Output Summary (Last 24 hours) at 08/16/11 1610 Last data filed at 08/15/11 2224  Gross per 24 hour  Intake    243 ml  Output      0 ml  Net    243 ml    Telemetry reveals sinus rhythm with occasional nonsustained atrial tachycardia, NSVT x 1  GEN- The patient is well appearing, alert and oriented x 3 today.   Head- normocephalic, atraumatic Eyes-  Sclera clear, conjunctiva pink Ears- hearing intact Oropharynx- clear Lymph- no cervical lymphadenopathy Lungs- Clear to ausculation bilaterally, normal work of breathing Heart- Regular rate and rhythm, no murmurs, rubs or gallops, PMI not laterally displaced GI- soft, NT, ND, + BS Extremities- no clubbing, cyanosis, or edema Cervical collar in place   ASSESSMENT AND PLAN:  Active Problems:  SVT (supraventricular tachycardia)  1. SVT-  Much improved Please discharge on diltiazem 240mg  daily I will follow up with her echo results as an outpatient  2. HTN- controlled with diltiazem Discharge on diltiazem and stop her home antihypertensive medication.  I can  restart these when I see her in the office if necessary  OK to discharge to home today from my standpoint.  Follow-up with me in the office in 2-3weeks ( my office will call with appointment) Thanks  Hillis Range, MD 08/16/2011 8:33 AM

## 2011-08-16 NOTE — Progress Notes (Signed)
*  PRELIMINARY RESULTS* Echocardiogram 2D Echocardiogram has been performed.  Clide Deutscher RDCS 08/16/2011, 10:20 AM

## 2011-08-16 NOTE — Progress Notes (Signed)
Pt/family given discharge instructions, follow up appts, medication reconciliation.  S/SX infection and when to call the MD. Thomas Hoff

## 2011-08-16 NOTE — Progress Notes (Signed)
Monitor tech informed the nurse that Patient exhibited 6 beats of V-tach @ 0436 am. Nurse checked on patient and she was asymptomatic. Harmon Pier

## 2011-08-16 NOTE — Discharge Summary (Signed)
Physician Discharge Summary  Patient ID: [name] MRN: [id number] DOB/AGE: 1940/11/14 71 y.o.  Admit date: 08/14/2011 Discharge date: 08/16/2011  Admission Diagnoses: Cervical spondylosis, cervical disc herniation, neck pain, radiculopathy  Discharge Diagnoses: Cervical spondylosis, cervical disc herniation, neck pain, radiculopathy, supraventricular tachycardia (SVT)  Active Problems:  SVT (supraventricular tachycardia)   Discharged Condition: good  Hospital Course: Patient was admitted by Dr. Venetia Maxon and underwent an ACDF. Postoperatively in the recovery room as well as on the 3000 units the patient had recurring tachycardia with heart rates between 130 and 150. The patient was evaluated initially by rapid response and then transferred to the intensive care unit for further monitoring. Dr. Hillis Range from [facility] cardiology was consulted. He initiated treatment with Cardizem and the patient's heart rate has stabilized. Notably the patient was asymptomatic throughout the period times while having the tachycardic episodes.  The patient was transferred to telemetry unit yesterday and has remained stable. She is up and ambulate actively and is requesting to be discharged. Dr. Johney Frame saw the patient this morning and feels that she can be discharged from a cardiology perspective.  Her wound is healing well there is no swelling erythema or drainage.   I've given the patient instructions regarding wound care and activities following discharge and also instructed her to followup with Dr. Venetia Maxon for neurosurgical followup in 2-3 weeks. I've also reiterated Dr. Jenel Lucks instructions, which he has already given her, that is to stop her previous blood pressure medication, to continue on the Cardizem CD 240 mg daily that he initiated and to followup with him in his office in 2-3 weeks. I have given her prescription for Cardizem CD 240 mg daily a total of 30 tablets with no refills per Dr.  Jenel Lucks request, with further prescriptions to be per his instructions.  Consults: cardiology  Significant Diagnostic Studies: None  Treatments: cardiac meds: Cardizem  Discharge Exam: Blood pressure 128/69, pulse 67, temperature 96.9 F (36.1 C), temperature source Oral, resp. rate 20, height 5\' 1"  (1.549 m), weight 58.8 kg (129 lb 10.1 oz), SpO2 91.00%.   Disposition: Home   Current Discharge Medication List    START taking these medications   Details  diltiazem (CARDIZEM CD) 240 MG 24 hr capsule Take 1 capsule (240 mg total) by mouth daily. Qty: 30 capsule, Refills: 0    oxyCODONE-acetaminophen (PERCOCET) 5-325 MG per tablet Take 1-2 tablets by mouth every 4 (four) hours as needed for pain. Qty: 50 tablet, Refills: 0      CONTINUE these medications which have NOT CHANGED   Details  Calcium Carbonate-Vitamin D (CALCIUM + D PO) Take 1 tablet by mouth daily.      Flaxseed, Linseed, (FLAXSEED OIL PO) Take 1 tablet by mouth daily.      LEXAPRO 20 MG tablet TAKE ONE TABLET BY MOUTH EVERY DAY Qty: 90 each, Refills: 3    Multiple Vitamins-Minerals (ICAPS) CAPS Take 1 capsule by mouth daily.      niacin 500 MG tablet Take 500 mg by mouth daily with breakfast.      travoprost, benzalkonium, (TRAVATAN) 0.004 % ophthalmic solution Place 1 drop into both eyes at bedtime.      ALPRAZolam (XANAX) 0.25 MG tablet Take 0.25 mg by mouth 3 (three) times daily as needed. For anxiety.       STOP taking these medications     AVALIDE 300-12.5 MG per tablet          Signed: Hewitt Shorts, MD 08/16/2011, 12:39 PM

## 2011-08-18 ENCOUNTER — Telehealth: Payer: Self-pay | Admitting: *Deleted

## 2011-08-18 NOTE — Telephone Encounter (Signed)
Pt has neck surgery last week with Dr. Venetia Maxon, and a Cardiologist was called in and gave her Cartia.  Today her BP is 158/92, and she called the EMTs.  Asking Dr. Lovell Sheehan what to do?

## 2011-08-18 NOTE — Telephone Encounter (Signed)
Pt is going to see the Cardiologist.

## 2011-08-18 NOTE — Telephone Encounter (Signed)
Per dr Jolaine Artist is not that high and it takes a while for med to kick in-- per dr Lovell Sheehan- she needs ov within the next few weeks

## 2011-09-11 ENCOUNTER — Encounter: Payer: Self-pay | Admitting: Internal Medicine

## 2011-09-11 ENCOUNTER — Ambulatory Visit (INDEPENDENT_AMBULATORY_CARE_PROVIDER_SITE_OTHER): Payer: Medicare Other | Admitting: Internal Medicine

## 2011-09-11 VITALS — BP 159/87 | HR 76 | Ht 61.0 in | Wt 123.0 lb

## 2011-09-11 DIAGNOSIS — I498 Other specified cardiac arrhythmias: Secondary | ICD-10-CM

## 2011-09-11 DIAGNOSIS — I471 Supraventricular tachycardia: Secondary | ICD-10-CM

## 2011-09-11 DIAGNOSIS — I10 Essential (primary) hypertension: Secondary | ICD-10-CM

## 2011-09-11 MED ORDER — DILTIAZEM HCL ER COATED BEADS 240 MG PO CP24: 240.0000 mg | ORAL_CAPSULE | Freq: Every day | ORAL | Status: DC

## 2011-09-11 NOTE — Progress Notes (Signed)
PCP:  Carrie Mew, MD, MD  The patient presents today for routine electrophysiology followup. I recently saw her at [facility] postoperatively following neck surgery for SVT.  Since her discharge, the patient reports doing very well.  She is unaware of any further tachycardia.   Today, she denies symptoms of palpitations, chest pain, shortness of breath, orthopnea, PND, lower extremity edema, dizziness, presyncope, syncope, or neurologic sequela.  The patient feels that she is tolerating medications without difficulties and is otherwise without complaint today.   Past Medical History  Diagnosis Date  . HYPERLIPIDEMIA 09/22/2007  . DEPRESSION 03/11/2007  . 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 CERUMEN IMPACTION, BILATERAL 12/13/2007  . HYPERTENSION 03/11/2007  . DIVERTICULOSIS, COLON 03/11/2007  . Rosacea 03/11/2007  . CERVICAL RADICULOPATHY 11/05/2009  . UNS ADVRS EFF UNS RX MEDICINAL&BIOLOGICAL SBSTNC 11/05/2009  . Difficult intubation     pt reports severe throat pain after surgery  . Arthritis   . Chronic kidney disease     kidney stones   Past Surgical History  Procedure Date  . Kidney surgery     age 11  . Cervical fusion     C4-6  . Tonsillectomy   . Colonoscopy   . Anterior cervical decomp/discectomy fusion 08/14/2011    Procedure: ANTERIOR CERVICAL DECOMPRESSION/DISCECTOMY FUSION 1 LEVEL/HARDWARE REMOVAL;  Surgeon: Dorian Heckle, MD;  Location: [facility];  Service: Neurosurgery;  Laterality: N/A;  Cervical Six-Seven anterior cervical decompression with fusion interbody prothesis plating and bonegraft with removal of hardware of Cervical Four to Cervical Six     Current Outpatient Prescriptions  Medication Sig Dispense Refill  . ALPRAZolam (XANAX) 0.25 MG tablet Take 0.25 mg by mouth 3 (three) times daily as needed. For anxiety.       . Calcium Carbonate-Vitamin D (CALCIUM + D PO) Take 1 tablet by mouth daily.        [name] diltiazem (CARDIZEM CD) 240 MG 24  hr capsule Take 1 capsule (240 mg total) by mouth daily.  30 capsule  11  . Flaxseed, Linseed, (FLAXSEED OIL PO) Take 1 tablet by mouth daily.        [name] LEXAPRO 20 MG tablet TAKE ONE TABLET BY MOUTH EVERY DAY  90 each  3  . Multiple Vitamins-Minerals (ICAPS) CAPS Take 1 capsule by mouth daily.        . niacin 500 MG tablet Take 500 mg by mouth daily with breakfast.        . oxyCODONE-acetaminophen (PERCOCET) 5-325 MG per tablet prn      . travoprost, benzalkonium, (TRAVATAN) 0.004 % ophthalmic solution Place 1 drop into both eyes at bedtime.        . irbesartan-hydrochlorothiazide (AVALIDE) 300-12.5 MG per tablet Take 1/2 tablet daily        Allergies  Allergen Reactions  . Codeine Nausea Only    History   Social History  . Marital Status: Widowed    Spouse Name: N/A    Number of Children: N/A  . Years of Education: N/A   Occupational History  . Not on file.   Social History Main Topics  . Smoking status: Never Smoker   . Smokeless tobacco: Never Used  . Alcohol Use: No  . Drug Use: No  . Sexually Active: Not on file   Other Topics Concern  . Not on file   Social History Narrative  . No narrative on file    Family History  Problem Relation Age of Onset  .  Mental illness Mother     comitted suicide  . Cancer Father     lung, smoker     Physical Exam: Filed Vitals:   09/11/11 1252  BP: 159/87  Pulse: 76  Height: 5\' 1"  (1.549 m)  Weight: 123 lb (55.792 kg)    GEN- The patient is well appearing, alert and oriented x 3 today.   Head- normocephalic, atraumatic Eyes-  Sclera clear, conjunctiva pink Ears- hearing intact Oropharynx- clear Neck- supple, no JVP Lymph- no cervical lymphadenopathy Lungs- Clear to ausculation bilaterally, normal work of breathing Heart- Regular rate and rhythm, no murmurs, rubs or gallops, PMI not laterally displaced GI- soft, NT, ND, + BS Extremities- no clubbing, cyanosis, or edema MS- no significant deformity or atrophy Skin-  no rash or lesion Psych- euthymic mood, full affect Neuro- strength and sensation are intact  ekg today reveals sinus rhythm, LAHB, otherwise normal ekg Echo 1/13 reveals normal EF, no significant valvular disease  Assessment and Plan:

## 2011-09-11 NOTE — Assessment & Plan Note (Signed)
Doing very well without symptomatic SVT Continue diltiazem Upon return, will consider event monitor to see if she is having further SVT before stopping diltiazem.

## 2011-09-11 NOTE — Assessment & Plan Note (Signed)
Above goal She was previously on irbesartan/hctz 300/12.5mg  daily. I will restart 1/2 tablet daily today She will monitor her BP at home

## 2011-09-11 NOTE — Patient Instructions (Signed)
Your physician recommends that you schedule a follow-up appointment in: 2 months  Your physician has recommended you make the following change in your medication:  1) re-start Avalide 300/12.5mg  1/2 tablet daily

## 2011-09-18 ENCOUNTER — Encounter: Payer: Self-pay | Admitting: Gynecology

## 2011-09-19 ENCOUNTER — Telehealth: Payer: Self-pay | Admitting: Internal Medicine

## 2011-09-19 ENCOUNTER — Encounter: Payer: Self-pay | Admitting: Gynecology

## 2011-09-19 ENCOUNTER — Encounter: Payer: Medicare Other | Admitting: Gynecology

## 2011-09-19 DIAGNOSIS — N200 Calculus of kidney: Secondary | ICD-10-CM | POA: Insufficient documentation

## 2011-09-19 NOTE — Progress Notes (Signed)
Addended by: Judithe Modest D on: 09/19/2011 02:57 PM   Modules accepted: Orders

## 2011-09-19 NOTE — Telephone Encounter (Signed)
2.8.13--pt calling wanting to let dr allred know her weekly BP check--today it was 158/90--nt

## 2011-09-19 NOTE — Telephone Encounter (Signed)
New message Pt said her BP was 158/90 and wanted to talk to a nurse Please call her back

## 2011-09-22 NOTE — Telephone Encounter (Signed)
Increase back to irbesartan/hctz 300/12.5mg  daily.

## 2011-09-23 NOTE — Telephone Encounter (Signed)
[id number]

## 2011-09-23 NOTE — Telephone Encounter (Signed)
Pt returned call... She had already increased her Avalide back to a whole tablet.

## 2011-09-26 ENCOUNTER — Ambulatory Visit (INDEPENDENT_AMBULATORY_CARE_PROVIDER_SITE_OTHER): Payer: Medicare Other | Admitting: Gynecology

## 2011-09-26 ENCOUNTER — Encounter: Payer: Self-pay | Admitting: Gynecology

## 2011-09-26 VITALS — BP 124/70 | Ht 60.0 in | Wt 128.0 lb

## 2011-09-26 DIAGNOSIS — M949 Disorder of cartilage, unspecified: Secondary | ICD-10-CM

## 2011-09-26 DIAGNOSIS — M899 Disorder of bone, unspecified: Secondary | ICD-10-CM

## 2011-09-26 DIAGNOSIS — N816 Rectocele: Secondary | ICD-10-CM

## 2011-09-26 DIAGNOSIS — M8580 Other specified disorders of bone density and structure, unspecified site: Secondary | ICD-10-CM

## 2011-09-26 DIAGNOSIS — N952 Postmenopausal atrophic vaginitis: Secondary | ICD-10-CM

## 2011-09-26 NOTE — Progress Notes (Addendum)
[name] September 25, 1940 [id number]        70 y.o.  For follow up.  Several issues that are below.  Past medical history,surgical history, medications, allergies, family history and social history were all reviewed and documented in the EPIC chart. ROS:  Was performed and pertinent positives and negatives are included in the history.  Exam: Sherrilyn Rist chaperone present Filed Vitals:   09/26/11 1453  BP: 124/70   General appearance  Normal Skin grossly normal Head/Neck normal with no cervical or supraclavicular adenopathy thyroid normal Lungs  clear Cardiac RR, without RMG Abdominal  soft, nontender, without masses, organomegaly or hernia Breasts  examined lying and sitting without masses, retractions, discharge or axillary adenopathy. Pelvic  Ext/BUS/vagina  normal with atrophic changes  Cervix  normal  Atrophic changes  Uterus  axial, normal size, shape and contour, midline and mobile nontender   Adnexa  Without masses or tenderness    Anus and perineum  normal   Rectovaginal  normal sphincter tone without palpated masses or tenderness. First degree rectocele with rectovaginal exam.   Assessment/Plan:  71 y.o. female for annual exam.    1. History of right anterior chest breast mass. She had ultrasound and mammogram last year which was consistent with a sebaceous cyst. Exam today is normal and I am unable to feel this, nor can the patient. We'll continue with self breast exams on a monthly basis and represent if she has any palpable abnormalities. 2. Atrophic genital changes. Patient is asymptomatic and we'll continue to follow. 3. Rectocele on exam. Patient asymptomatic and we'll continue to follow. 4. Mammography. Patient just had her mammogram February 7 and will continue with annual mammography. SBE reviewed. 5. Pap smear. Last Pap smear 2012. She has no history of any abnormal Pap smears with numerous normal reports in her chart. I discussed current screening guidelines and will  stop doing Pap smears as she is over 65 she is comfortable with this. 6. Osteopenia. Bone density January 2012 showed T score -1.1. We'll plan on repeating another year or 2. Increase calcium vitamin D reviewed. 7. Colonoscopy. Patient is up-to-date with colonoscopy we'll continue to follow per their recommendation. 8. Health maintenance. No blood work was done today as it is all done through Dr. Lovell Sheehan office who follows her routinely and she is due to see him.   Dara Lords MD, 3:30 PM 09/26/2011

## 2011-09-26 NOTE — Patient Instructions (Signed)
Follow up for GYN exam in 1 year.

## 2011-10-06 NOTE — Progress Notes (Signed)
Addended by: Valeda Malm L on: 10/06/2011 03:53 PM   Modules accepted: Orders

## 2011-10-27 ENCOUNTER — Encounter: Payer: Self-pay | Admitting: Internal Medicine

## 2011-11-03 ENCOUNTER — Encounter: Payer: Self-pay | Admitting: Internal Medicine

## 2011-11-03 ENCOUNTER — Ambulatory Visit (INDEPENDENT_AMBULATORY_CARE_PROVIDER_SITE_OTHER): Payer: Medicare Other | Admitting: Internal Medicine

## 2011-11-03 VITALS — BP 157/83 | HR 61 | Resp 18 | Ht 61.0 in | Wt 128.0 lb

## 2011-11-03 DIAGNOSIS — I498 Other specified cardiac arrhythmias: Secondary | ICD-10-CM

## 2011-11-03 DIAGNOSIS — I471 Supraventricular tachycardia: Secondary | ICD-10-CM

## 2011-11-03 DIAGNOSIS — I10 Essential (primary) hypertension: Secondary | ICD-10-CM

## 2011-11-03 LAB — BASIC METABOLIC PANEL
CO2: 27 mEq/L (ref 19–32)
Calcium: 9.2 mg/dL (ref 8.4–10.5)
GFR: 79.32 mL/min (ref >60.00)
Potassium: 3.4 mEq/L — ABNORMAL LOW (ref 3.5–5.1)
Sodium: 138 mEq/L (ref 135–145)

## 2011-11-03 MED ORDER — HYDROCHLOROTHIAZIDE 25 MG PO TABS: 25.0000 mg | ORAL_TABLET | Freq: Every day | ORAL | Status: DC

## 2011-11-03 MED ORDER — IRBESARTAN 300 MG PO TABS: 300.0000 mg | ORAL_TABLET | Freq: Every day | ORAL | Status: DC

## 2011-11-03 NOTE — Progress Notes (Signed)
Addended by: Dennis Bast F on: 11/03/2011 12:55 PM   Modules accepted: Orders

## 2011-11-03 NOTE — Progress Notes (Signed)
PCP:  Carrie Mew, MD, MD  The patient presents today for routine cardiology followup.  She is unaware of any further tachycardia.   Today, she denies symptoms of palpitations, chest pain, shortness of breath, orthopnea, PND, lower extremity edema, dizziness, presyncope, syncope, or neurologic sequela.  The patient feels that she is tolerating medications without difficulties and is otherwise without complaint today.   Past Medical History  Diagnosis Date  . HYPERLIPIDEMIA 09/22/2007  . DEPRESSION 03/11/2007  . 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 CERUMEN IMPACTION, BILATERAL 12/13/2007  . HYPERTENSION 03/11/2007  . DIVERTICULOSIS, COLON 03/11/2007  . Rosacea 03/11/2007  . CERVICAL RADICULOPATHY 11/05/2009  . UNS ADVRS EFF UNS RX MEDICINAL&BIOLOGICAL SBSTNC 11/05/2009  . Difficult intubation     pt reports severe throat pain after surgery  . Arthritis   . Chronic kidney disease     kidney stones  . Kidney stone    Past Surgical History  Procedure Date  . Kidney surgery     age 61  . Cervical fusion     C4-6  . Tonsillectomy   . Colonoscopy   . Anterior cervical decomp/discectomy fusion 08/14/2011    Procedure: ANTERIOR CERVICAL DECOMPRESSION/DISCECTOMY FUSION 1 LEVEL/HARDWARE REMOVAL;  Surgeon: Dorian Heckle, MD;  Location: [facility];  Service: Neurosurgery;  Laterality: N/A;  Cervical Six-Seven anterior cervical decompression with fusion interbody prothesis plating and bonegraft with removal of hardware of Cervical Four to Cervical Six     Current Outpatient Prescriptions  Medication Sig Dispense Refill  . ALPRAZolam (XANAX) 0.25 MG tablet Take 0.25 mg by mouth 3 (three) times daily as needed. For anxiety.       [name] BIOTIN PO Take by mouth.      . Calcium Carbonate-Vitamin D (CALCIUM + D PO) Take 1 tablet by mouth daily.        [name] diltiazem (CARDIZEM CD) 240 MG 24 hr capsule Take 1 capsule (240 mg total) by mouth daily.  30 capsule  11  . Flaxseed, Linseed,  (FLAXSEED OIL PO) Take 1 tablet by mouth daily.        . irbesartan-hydrochlorothiazide (AVALIDE) 300-12.5 MG per tablet 1 tablet.       [name] LEXAPRO 20 MG tablet TAKE ONE TABLET BY MOUTH EVERY DAY  90 each  3  . Multiple Vitamins-Minerals (ICAPS) CAPS Take 1 capsule by mouth daily.        . niacin 500 MG tablet Take 500 mg by mouth daily with breakfast.        . OVER THE COUNTER MEDICATION dcaps      . travoprost, benzalkonium, (TRAVATAN) 0.004 % ophthalmic solution Place 1 drop into both eyes at bedtime.          Allergies  Allergen Reactions  . Codeine Nausea Only    History   Social History  . Marital Status: Widowed    Spouse Name: N/A    Number of Children: N/A  . Years of Education: N/A   Occupational History  . Not on file.   Social History Main Topics  . Smoking status: Never Smoker   . Smokeless tobacco: Never Used  . Alcohol Use: No  . Drug Use: No  . Sexually Active: No   Other Topics Concern  . Not on file   Social History Narrative  . No narrative on file    Family History  Problem Relation Age of Onset  . Mental illness Mother     comitted suicide  .  Cancer Father     lung, smoker  . Hypertension Father   . Heart disease Father   . Cerebral palsy Sister   . Diabetes Maternal Grandmother      Physical Exam: Filed Vitals:   11/03/11 1207  BP: 157/83  Pulse: 61  Resp: 18  Height: 5\' 1"  (1.549 m)  Weight: 128 lb (58.06 kg)    GEN- The patient is well appearing, alert and oriented x 3 today.   Head- normocephalic, atraumatic Eyes-  Sclera clear, conjunctiva pink Ears- hearing intact Oropharynx- clear Neck- supple, no JVP Lymph- no cervical lymphadenopathy Lungs- Clear to ausculation bilaterally, normal work of breathing Heart- Regular rate and rhythm, no murmurs, rubs or gallops, PMI not laterally displaced GI- soft, NT, ND, + BS Extremities- no clubbing, cyanosis, or edema MS- no significant deformity or atrophy Skin- no rash or  lesion Psych- euthymic mood, full affect Neuro- strength and sensation are intact Echo 1/13 reveals normal EF, no significant valvular disease  Assessment and Plan:

## 2011-11-03 NOTE — Assessment & Plan Note (Signed)
Above goal Increase hctz to 25mg  daily bmet today

## 2011-11-03 NOTE — Patient Instructions (Signed)
Your physician wants you to follow-up in: 4 months with Dr Allred You will receive a reminder letter in the mail two months in advance. If you don't receive a letter, please call our office to schedule the follow-up appointment.  

## 2011-11-03 NOTE — Assessment & Plan Note (Signed)
Atach is well controlled with cardizem No changes

## 2011-11-04 ENCOUNTER — Other Ambulatory Visit: Payer: Self-pay | Admitting: *Deleted

## 2011-11-04 ENCOUNTER — Encounter: Payer: Self-pay | Admitting: Internal Medicine

## 2011-11-04 DIAGNOSIS — E876 Hypokalemia: Secondary | ICD-10-CM

## 2011-11-14 ENCOUNTER — Other Ambulatory Visit: Payer: Medicare Other

## 2011-11-19 ENCOUNTER — Ambulatory Visit (AMBULATORY_SURGERY_CENTER): Payer: Medicare Other | Admitting: *Deleted

## 2011-11-19 ENCOUNTER — Other Ambulatory Visit (INDEPENDENT_AMBULATORY_CARE_PROVIDER_SITE_OTHER): Payer: Medicare Other

## 2011-11-19 VITALS — Ht 61.0 in | Wt 127.9 lb

## 2011-11-19 DIAGNOSIS — Z1211 Encounter for screening for malignant neoplasm of colon: Secondary | ICD-10-CM

## 2011-11-19 DIAGNOSIS — E876 Hypokalemia: Secondary | ICD-10-CM

## 2011-11-19 LAB — BASIC METABOLIC PANEL
Chloride: 101 mEq/L (ref 96–112)
Potassium: 3.9 mEq/L (ref 3.5–5.1)

## 2011-11-19 MED ORDER — PEG-KCL-NACL-NASULF-NA ASC-C 100 G PO SOLR: ORAL | Status: DC

## 2011-11-28 ENCOUNTER — Encounter: Payer: Self-pay | Admitting: Internal Medicine

## 2011-11-28 ENCOUNTER — Ambulatory Visit (AMBULATORY_SURGERY_CENTER): Payer: Medicare Other | Admitting: Internal Medicine

## 2011-11-28 VITALS — BP 143/85 | HR 74 | Temp 96.0°F | Resp 20 | Ht 61.0 in | Wt 127.0 lb

## 2011-11-28 DIAGNOSIS — K5730 Diverticulosis of large intestine without perforation or abscess without bleeding: Secondary | ICD-10-CM

## 2011-11-28 DIAGNOSIS — D126 Benign neoplasm of colon, unspecified: Secondary | ICD-10-CM

## 2011-11-28 DIAGNOSIS — Z1211 Encounter for screening for malignant neoplasm of colon: Secondary | ICD-10-CM

## 2011-11-28 DIAGNOSIS — Z86010 Personal history of colonic polyps: Secondary | ICD-10-CM | POA: Insufficient documentation

## 2011-11-28 DIAGNOSIS — K649 Unspecified hemorrhoids: Secondary | ICD-10-CM

## 2011-11-28 MED ORDER — SODIUM CHLORIDE 0.9 % IV SOLN: 500.0000 mL | INTRAVENOUS | Status: DC

## 2011-11-28 NOTE — Patient Instructions (Signed)

## 2011-11-28 NOTE — Progress Notes (Signed)
Patient did not experience any of the following events: a burn prior to discharge; a fall within the facility; wrong site/side/patient/procedure/implant event; or a hospital transfer or hospital admission upon discharge from the facility. (G8907) Patient did not have preoperative order for IV antibiotic SSI prophylaxis. (G8918)  

## 2011-11-28 NOTE — Op Note (Signed)
[facility] [address] [place], [place]  [address]  COLONOSCOPY PROCEDURE REPORT  PATIENT:  Courtney, Montoya  MR#:  [id number] BIRTHDATE:  1941/07/11, 70 yrs. old  GENDER:  female ENDOSCOPIST:  Iva Boop, MD, [facility]  PROCEDURE DATE:  11/28/2011 PROCEDURE:  Colonoscopy with snare polypectomy ASA CLASS:  Class II INDICATIONS:  surveillance and high-risk screening, history of pre-cancerous (adenomatous) colon polyps 2006 index - serrated adenomas x 2 largest 10 mm 2008- no polyps MEDICATIONS:   These medications were titrated to patient response per physician's verbal order, Fentanyl 100 mcg IV, Versed 8 mg IV  DESCRIPTION OF PROCEDURE:   After the risks benefits and alternatives of the procedure were thoroughly explained, informed consent was obtained.  Digital rectal exam was performed and revealed no abnormalities.   The LB [id number] endoscope was introduced through the anus and advanced to the cecum, which was identified by both the appendix and ileocecal valve, without limitations.  The quality of the prep was excellent, using MoviPrep.  The instrument was then slowly withdrawn as the colon was fully examined. <<PROCEDUREIMAGES>>  FINDINGS:  A sessile polyp was found at the hepatic flexure. It was 7 mm in size. Polyp was snared without cautery. Retrieval was successful. snare polyp  Severe diverticulosis was found throughout the colon.  This was otherwise a normal examination of the colon.   Retroflexed views in the rectum revealed internal and external hemorrhoids.    The time to cecum = 3:26 minutes. The scope was then withdrawn in 14:39 minutes from the cecum and the procedure completed. COMPLICATIONS:  None ENDOSCOPIC IMPRESSION: 1) 7 mm sessile polyp at the hepatic flexure - removed 2) Severe diverticulosis throughout the colon 3) Internal and external hemorrhoids 4) Otherwise normal examination - excellent prep 5) Personal history of adenoma  removal 2006  REPEAT EXAM:  In for Colonoscopy, pending biopsy results.  Iva Boop, MD, [facility]  CC:  The Patient  n. eSIGNED:   Iva Boop at 11/28/2011 02:50 PM  Darlyne Russian, [phone]

## 2011-12-01 ENCOUNTER — Telehealth: Payer: Self-pay | Admitting: Internal Medicine

## 2011-12-01 ENCOUNTER — Telehealth: Payer: Self-pay | Admitting: *Deleted

## 2011-12-01 NOTE — Telephone Encounter (Signed)
Spoke with pt. Given BMET results from 11/19/11.

## 2011-12-01 NOTE — Telephone Encounter (Signed)
Pt calling for blood work results from 2 weeks ago

## 2011-12-01 NOTE — Telephone Encounter (Signed)
  Follow up Call-  Call back number 11/28/2011  Post procedure Call Back phone  # [phone]  Permission to leave phone message Yes     Patient questions:  Do you have a fever, pain , or abdominal swelling? no Pain Score  0 *  Have you tolerated food without any problems? yes  Have you been able to return to your normal activities? yes  Do you have any questions about your discharge instructions: Diet   no Medications  no Follow up visit  no  Do you have questions or concerns about your Care? no  Actions: * If pain score is 4 or above: No action needed, pain <4.

## 2011-12-04 ENCOUNTER — Encounter: Payer: Self-pay | Admitting: Internal Medicine

## 2011-12-08 ENCOUNTER — Telehealth: Payer: Self-pay | Admitting: *Deleted

## 2011-12-08 NOTE — Telephone Encounter (Signed)
Notified pt. She will schedule the appt.

## 2011-12-08 NOTE — Telephone Encounter (Signed)
Per dr Lovell Sheehan that is fine,but she needs to make an ov within the next few months to make sure it is workimng

## 2011-12-08 NOTE — Telephone Encounter (Addendum)
Pt has been notified that her Avalide would be generic, and she wants to know if Dr. Lovell Sheehan agrees with this. I  Am not  seeing where it is on her present list???

## 2012-01-21 ENCOUNTER — Encounter: Payer: Self-pay | Admitting: Family Medicine

## 2012-01-21 ENCOUNTER — Ambulatory Visit (INDEPENDENT_AMBULATORY_CARE_PROVIDER_SITE_OTHER): Payer: Medicare Other | Admitting: Family Medicine

## 2012-01-21 VITALS — BP 138/70 | HR 76 | Temp 98.1°F | Wt 128.0 lb

## 2012-01-21 DIAGNOSIS — I10 Essential (primary) hypertension: Secondary | ICD-10-CM

## 2012-01-21 NOTE — Progress Notes (Signed)
  Subjective:    Patient ID: [name], female    DOB: Jun 24, 1941, 71 y.o.   MRN: [id number]  HPI  Patient seen with concerns for elevated blood pressure. Yesterday after walking she had increased fatigue. 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 reportedly had blood pressure 180/110. She gives history that she had recent sore throat and nasal congestion and took Mucinex D for a couple days. She's not sure but thinks her last dose was either yesterday or the day before. She walks for exercise has not had exertional chest pains or dyspnea. No headaches. Actually feels somewhat better today.  She has hypertension treated with diltiazem 240 mg, Avapro 300 mg, HCTZ 25 mg daily. She's had prior history of SVT. Recent thyroid functions normal   Review of Systems  Constitutional: Negative for fatigue.  Eyes: Negative for visual disturbance.  Respiratory: Negative for cough, chest tightness, shortness of breath and wheezing.   Cardiovascular: Negative for chest pain, palpitations and leg swelling.  Neurological: Negative for dizziness, seizures, syncope, weakness, light-headedness and headaches.       Objective:   Physical Exam  Constitutional: She appears well-developed and well-nourished. No distress.  Neck: Neck supple. No thyromegaly present.  Cardiovascular: Normal rate and regular rhythm.   Pulmonary/Chest: Effort normal and breath sounds normal. No respiratory distress. She has no wheezes. She has no rales.  Musculoskeletal: She exhibits no edema.  Lymphadenopathy:    She has no cervical adenopathy.          Assessment & Plan:  #1 hypertension with recent exacerbation. Probably exacerbated by Mucinex D. Avoid further decongestants. Continue current medications.

## 2012-01-21 NOTE — Patient Instructions (Addendum)
Avoid any further Mucinex D-this can elevate blood pressure and heart rate.

## 2012-03-09 ENCOUNTER — Other Ambulatory Visit: Payer: Self-pay

## 2012-03-13 ENCOUNTER — Other Ambulatory Visit: Payer: Self-pay | Admitting: Internal Medicine

## 2012-03-15 ENCOUNTER — Telehealth: Payer: Self-pay | Admitting: Internal Medicine

## 2012-03-15 ENCOUNTER — Ambulatory Visit (INDEPENDENT_AMBULATORY_CARE_PROVIDER_SITE_OTHER): Payer: Medicare Other | Admitting: Internal Medicine

## 2012-03-15 ENCOUNTER — Encounter: Payer: Self-pay | Admitting: Internal Medicine

## 2012-03-15 VITALS — BP 122/70 | HR 85 | Resp 18 | Ht 61.0 in | Wt 129.0 lb

## 2012-03-15 DIAGNOSIS — I10 Essential (primary) hypertension: Secondary | ICD-10-CM

## 2012-03-15 DIAGNOSIS — I471 Supraventricular tachycardia: Secondary | ICD-10-CM

## 2012-03-15 DIAGNOSIS — I498 Other specified cardiac arrhythmias: Secondary | ICD-10-CM

## 2012-03-15 LAB — BASIC METABOLIC PANEL
BUN: 17 mg/dL (ref 6–23)
Chloride: 100 mEq/L (ref 96–112)
Creatinine, Ser: 0.9 mg/dL (ref 0.4–1.2)
Glucose, Bld: 82 mg/dL (ref 70–99)

## 2012-03-15 NOTE — Assessment & Plan Note (Signed)
Well controlled with diltiazem She does not wish to consider ablation at this time

## 2012-03-15 NOTE — Addendum Note (Signed)
Addended by: Dennis Bast F on: 03/15/2012 10:55 AM   Modules accepted: Orders

## 2012-03-15 NOTE — Patient Instructions (Signed)
Your physician wants you to follow-up in: 12 months with Dr Jacquiline Doe will receive a reminder letter in the mail two months in advance. If you don't receive a letter, please call our office to schedule the follow-up appointment.  Your physician recommends that you return for lab work today

## 2012-03-15 NOTE — Progress Notes (Signed)
PCP: Carrie Mew, MD Primary Cardiologist:  [name] is a 71 y.o. female who presents today for routine electrophysiology followup.  Since last being seen in our clinic, the patient reports doing very well.  Today, she denies symptoms of palpitations, chest pain, shortness of breath,  lower extremity edema, dizziness, presyncope, or syncope.  The patient is otherwise without complaint today.   Past Medical History  Diagnosis Date  . HYPERLIPIDEMIA 09/22/2007  . DEPRESSION 03/11/2007  . 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 CERUMEN IMPACTION, BILATERAL 12/13/2007  . HYPERTENSION 03/11/2007  . DIVERTICULOSIS, COLON 03/11/2007  . Rosacea 03/11/2007  . CERVICAL RADICULOPATHY 11/05/2009  . UNS ADVRS EFF UNS RX MEDICINAL&BIOLOGICAL SBSTNC 11/05/2009  . Difficult intubation     pt reports severe throat pain after surgery  . Arthritis   . Chronic kidney disease     kidney stones  . Kidney stone   . Colon polyp    Past Surgical History  Procedure Date  . Kidney surgery     age 72  . Cervical fusion     C4-6  . Tonsillectomy   . Colonoscopy   . Anterior cervical decomp/discectomy fusion 08/14/2011    Procedure: ANTERIOR CERVICAL DECOMPRESSION/DISCECTOMY FUSION 1 LEVEL/HARDWARE REMOVAL;  Surgeon: Dorian Heckle, MD;  Location: [facility];  Service: Neurosurgery;  Laterality: N/A;  Cervical Six-Seven anterior cervical decompression with fusion interbody prothesis plating and bonegraft with removal of hardware of Cervical Four to Cervical Six     Current Outpatient Prescriptions  Medication Sig Dispense Refill  . ALPRAZolam (XANAX) 0.25 MG tablet Take 0.25 mg by mouth 3 (three) times daily as needed. For anxiety.       [name] BIOTIN PO Take by mouth.      . Calcium Carbonate-Vitamin D (CALCIUM + D PO) Take 1 tablet by mouth daily.        . cephALEXin (KEFLEX) 500 MG capsule       . clobetasol cream (TEMOVATE) 0.05 %       . diltiazem (CARDIZEM CD) 240 MG 24 hr  capsule Take 1 capsule (240 mg total) by mouth daily.  30 capsule  11  . Flaxseed, Linseed, (FLAXSEED OIL PO) Take 1 tablet by mouth daily.        . hydrochlorothiazide (HYDRODIURIL) 25 MG tablet Take 1 tablet (25 mg total) by mouth daily.  30 tablet  11  . irbesartan (AVAPRO) 300 MG tablet Take 1 tablet (300 mg total) by mouth daily.  30 tablet  11  . latanoprost (XALATAN) 0.005 % ophthalmic solution       . LEXAPRO 20 MG tablet TAKE ONE TABLET BY MOUTH EVERY DAY  90 each  3  . Multiple Vitamins-Minerals (ICAPS) CAPS Take 1 capsule by mouth daily.        . niacin 500 MG tablet Take 500 mg by mouth daily with breakfast.        . travoprost, benzalkonium, (TRAVATAN) 0.004 % ophthalmic solution Place 1 drop into both eyes at bedtime.        [name] DISCONTD: hydrochlorothiazide (HYDRODIURIL) 25 MG tablet       . DISCONTD: irbesartan-hydrochlorothiazide (AVALIDE) 300-12.5 MG per tablet 1 tablet.         Physical Exam: Filed Vitals:   03/15/12 1010  BP: 122/70  Pulse: 85  Resp: 18  Height: 5\' 1"  (1.549 m)  Weight: 129 lb (58.514 kg)  SpO2: 98%    GEN- The patient is well appearing, alert  and oriented x 3 today.   Head- normocephalic, atraumatic Eyes-  Sclera clear, conjunctiva pink Ears- hearing intact Oropharynx- clear Lungs- Clear to ausculation bilaterally, normal work of breathing Heart- Regular rate and rhythm, no murmurs, rubs or gallops, PMI not laterally displaced GI- soft, NT, ND, + BS Extremities- no clubbing, cyanosis, or edema   Assessment and Plan:

## 2012-03-15 NOTE — Assessment & Plan Note (Signed)
Stable No change required today  bmet today 

## 2012-03-15 NOTE — Telephone Encounter (Signed)
Caller: [name]/Patient; PCP: Darryll Capers; CB#: [phone]; Call regarding Escitalopram 20 Mgs 1 PO QD- Needs Refill. Pharmacy Put in Refill Request and Office Refused; PLEASE CALL PT TO LET HER KNOW IF REFILL HAS BEEN SENT. SHE ONLY HAS ONE 1 PILL LEFT. MAY LEAVE MESSAGE, SHE WILL BE AT [facility]- OR CAN CALL #[phone].

## 2012-03-16 ENCOUNTER — Other Ambulatory Visit: Payer: Self-pay | Admitting: *Deleted

## 2012-03-16 ENCOUNTER — Telehealth: Payer: Self-pay | Admitting: Internal Medicine

## 2012-03-16 MED ORDER — ESCITALOPRAM OXALATE 20 MG PO TABS: 20.0000 mg | ORAL_TABLET | Freq: Every day | ORAL | Status: DC

## 2012-03-16 NOTE — Telephone Encounter (Signed)
Sent to pharmacy-Left message on machine at home

## 2012-03-16 NOTE — Telephone Encounter (Signed)
Caller: Anicka/Patient; PCP: Darryll Capers; CB#: [phone];  Call regarding Lexapro/ Escitalopram Prescription Request; Requests 90 day supply to be called in to [facility] in [place].  It was refillable on 03/06/12 but she did not get to the pharmacy by that date.  Information noted.  Checked in [place]  and gave information that Rx was called in to [facility] by [facility] per note at 08?30 on 03/16/12.    Caller states she picked up the Avapro on 03/15/12.

## 2012-03-19 ENCOUNTER — Other Ambulatory Visit: Payer: Self-pay

## 2012-03-30 ENCOUNTER — Ambulatory Visit: Payer: Medicare Other | Admitting: Internal Medicine

## 2012-04-09 ENCOUNTER — Ambulatory Visit: Payer: Medicare Other | Admitting: Internal Medicine

## 2012-05-26 ENCOUNTER — Other Ambulatory Visit: Payer: Self-pay | Admitting: Internal Medicine

## 2012-06-11 ENCOUNTER — Ambulatory Visit (INDEPENDENT_AMBULATORY_CARE_PROVIDER_SITE_OTHER): Payer: Medicare Other | Admitting: Internal Medicine

## 2012-06-11 ENCOUNTER — Encounter: Payer: Self-pay | Admitting: Internal Medicine

## 2012-06-11 VITALS — BP 118/70 | Temp 97.8°F | Wt 131.0 lb

## 2012-06-11 DIAGNOSIS — R5383 Other fatigue: Secondary | ICD-10-CM

## 2012-06-11 DIAGNOSIS — F3289 Other specified depressive episodes: Secondary | ICD-10-CM

## 2012-06-11 DIAGNOSIS — F329 Major depressive disorder, single episode, unspecified: Secondary | ICD-10-CM

## 2012-06-11 DIAGNOSIS — I471 Supraventricular tachycardia: Secondary | ICD-10-CM

## 2012-06-11 DIAGNOSIS — I10 Essential (primary) hypertension: Secondary | ICD-10-CM

## 2012-06-11 DIAGNOSIS — E785 Hyperlipidemia, unspecified: Secondary | ICD-10-CM

## 2012-06-11 DIAGNOSIS — Z23 Encounter for immunization: Secondary | ICD-10-CM

## 2012-06-11 DIAGNOSIS — I498 Other specified cardiac arrhythmias: Secondary | ICD-10-CM

## 2012-06-11 LAB — COMPREHENSIVE METABOLIC PANEL
Albumin: 3.8 g/dL (ref 3.5–5.2)
BUN: 20 mg/dL (ref 6–23)
CO2: 30 mEq/L (ref 19–32)
Calcium: 9.5 mg/dL (ref 8.4–10.5)
Chloride: 98 mEq/L (ref 96–112)
Creatinine, Ser: 0.8 mg/dL (ref 0.4–1.2)
GFR: 72.54 mL/min (ref >60.00)
Glucose, Bld: 115 mg/dL — ABNORMAL HIGH (ref 70–99)
Potassium: 3.6 mEq/L (ref 3.5–5.1)

## 2012-06-11 LAB — CBC WITH DIFFERENTIAL/PLATELET
Basophils Absolute: 0 10*3/uL (ref 0.0–0.1)
Eosinophils Absolute: 0 10*3/uL (ref 0.0–0.7)
Lymphocytes Relative: 30.6 % (ref 12.0–46.0)
MCHC: 33.4 g/dL (ref 30.0–36.0)
Monocytes Absolute: 0.6 10*3/uL (ref 0.1–1.0)
Neutrophils Relative %: 61.4 % (ref 43.0–77.0)
Platelets: 255 10*3/uL (ref 150.0–400.0)
RBC: 3.95 Mil/uL (ref 3.87–5.11)
RDW: 12.5 % (ref 11.5–14.6)

## 2012-06-11 LAB — SEDIMENTATION RATE: Sed Rate: 15 mm/hr (ref 0–22)

## 2012-06-11 LAB — TSH: TSH: 0.79 u[IU]/mL (ref 0.35–5.50)

## 2012-06-11 MED ORDER — HYDROCHLOROTHIAZIDE 25 MG PO TABS: 25.0000 mg | ORAL_TABLET | Freq: Every day | ORAL | Status: DC

## 2012-06-11 MED ORDER — IRBESARTAN-HYDROCHLOROTHIAZIDE 300-12.5 MG PO TABS: 1.0000 | ORAL_TABLET | Freq: Every day | ORAL | Status: DC

## 2012-06-11 MED ORDER — DILTIAZEM HCL ER COATED BEADS 240 MG PO CP24: 240.0000 mg | ORAL_CAPSULE | Freq: Every day | ORAL | Status: DC

## 2012-06-11 MED ORDER — ESCITALOPRAM OXALATE 20 MG PO TABS: 20.0000 mg | ORAL_TABLET | Freq: Every day | ORAL | Status: DC

## 2012-06-11 NOTE — Patient Instructions (Addendum)
Limit your sodium (Salt) intake    It is important that you exercise regularly, at least 20 minutes 3 to 4 times per week.  If you develop chest pain or shortness of breath seek  medical attention.  Return in 6 months for follow-up  

## 2012-06-11 NOTE — Progress Notes (Signed)
Subjective:    Patient ID: [name], female    DOB: 1940-09-05, 71 y.o.   MRN: [id number]  HPI  71 year old patient who has a history of chronic hypertension. She is also followed by cardiology once or twice yearly for SVT. This has been stable. She has history depression and today complains of fatigue of long-standing duration. For the past few months she feels this has perhaps worsened. She remains on Lexapro 20 mg daily. She has treated hypertension and additional hydrochlorothiazide has been added to her regimen by cardiology. She has a history of cervical radiculopathy which has been stable her dyslipidemia has been controlled with niacin no focal concerns or complaints  Past Medical History  Diagnosis Date  . HYPERLIPIDEMIA 09/22/2007  . DEPRESSION 03/11/2007  . 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 CERUMEN IMPACTION, BILATERAL 12/13/2007  . HYPERTENSION 03/11/2007  . DIVERTICULOSIS, COLON 03/11/2007  . Rosacea 03/11/2007  . CERVICAL RADICULOPATHY 11/05/2009  . UNS ADVRS EFF UNS RX MEDICINAL&BIOLOGICAL SBSTNC 11/05/2009  . Difficult intubation     pt reports severe throat pain after surgery  . Arthritis   . Chronic kidney disease     kidney stones  . Kidney stone   . Colon polyp     History   Social History  . Marital Status: Widowed    Spouse Name: N/A    Number of Children: N/A  . Years of Education: N/A   Occupational History  . Not on file.   Social History Main Topics  . Smoking status: Never Smoker   . Smokeless tobacco: Never Used  . Alcohol Use: No  . Drug Use: No  . Sexually Active: No   Other Topics Concern  . Not on file   Social History Narrative  . No narrative on file    Past Surgical History  Procedure Date  . Kidney surgery     age 30  . Cervical fusion     C4-6  . Tonsillectomy   . Colonoscopy   . Anterior cervical decomp/discectomy fusion 08/14/2011    Procedure: ANTERIOR CERVICAL DECOMPRESSION/DISCECTOMY FUSION 1  LEVEL/HARDWARE REMOVAL;  Surgeon: Dorian Heckle, MD;  Location: [facility];  Service: Neurosurgery;  Laterality: N/A;  Cervical Six-Seven anterior cervical decompression with fusion interbody prothesis plating and bonegraft with removal of hardware of Cervical Four to Cervical Six     Family History  Problem Relation Age of Onset  . Mental illness Mother     comitted suicide  . Cancer Father     lung, smoker  . Hypertension Father   . Heart disease Father   . Cerebral palsy Sister   . Diabetes Maternal Grandmother   . Colon cancer Neg Hx   . Stomach cancer Neg Hx     Allergies  Allergen Reactions  . Codeine Nausea Only    Current Outpatient Prescriptions on File Prior to Visit  Medication Sig Dispense Refill  . BIOTIN PO Take by mouth.      . Calcium Carbonate-Vitamin D (CALCIUM + D PO) Take 1 tablet by mouth daily.        . Flaxseed, Linseed, (FLAXSEED OIL PO) Take 1 tablet by mouth daily.        [name] latanoprost (XALATAN) 0.005 % ophthalmic solution       . Multiple Vitamins-Minerals (ICAPS) CAPS Take 1 capsule by mouth daily.        . niacin 500 MG tablet Take 500 mg by mouth daily with breakfast.        .  travoprost, benzalkonium, (TRAVATAN) 0.004 % ophthalmic solution Place 1 drop into both eyes at bedtime.        [name] DISCONTD: diltiazem (CARDIZEM CD) 240 MG 24 hr capsule Take 1 capsule (240 mg total) by mouth daily.  30 capsule  11  . DISCONTD: escitalopram (LEXAPRO) 20 MG tablet Take 1 tablet (20 mg total) by mouth daily.  30 tablet  6  . DISCONTD: hydrochlorothiazide (HYDRODIURIL) 25 MG tablet Take 1 tablet (25 mg total) by mouth daily.  30 tablet  11  . DISCONTD: irbesartan-hydrochlorothiazide (AVALIDE) 300-12.5 MG per tablet TAKE 1 TABLET BY MOUTH EVERY DAY  90 tablet  0  . DISCONTD: LEXAPRO 20 MG tablet TAKE ONE TABLET BY MOUTH EVERY DAY  90 each  3    BP 118/70  Temp 97.8 F (36.6 C) (Oral)  Wt 131 lb (59.421 kg)  LMP 08/12/1991       Review of Systems    Constitutional: Positive for fatigue.  HENT: Negative for hearing loss, congestion, sore throat, rhinorrhea, dental problem, sinus pressure and tinnitus.   Eyes: Negative for pain, discharge and visual disturbance.  Respiratory: Negative for cough and shortness of breath.   Cardiovascular: Negative for chest pain, palpitations and leg swelling.  Gastrointestinal: Negative for nausea, vomiting, abdominal pain, diarrhea, constipation, blood in stool and abdominal distention.  Genitourinary: Negative for dysuria, urgency, frequency, hematuria, flank pain, vaginal bleeding, vaginal discharge, difficulty urinating, vaginal pain and pelvic pain.  Musculoskeletal: Negative for joint swelling, arthralgias and gait problem.  Skin: Negative for rash.  Neurological: Negative for dizziness, syncope, speech difficulty, weakness, numbness and headaches.  Hematological: Negative for adenopathy.  Psychiatric/Behavioral: Positive for dysphoric mood. Negative for behavioral problems and agitation. The patient is not nervous/anxious.        Objective:   Physical Exam  Constitutional: She is oriented to person, place, and time. She appears well-developed and well-nourished.  HENT:  Head: Normocephalic.  Right Ear: External ear normal.  Left Ear: External ear normal.  Mouth/Throat: Oropharynx is clear and moist.  Eyes: Conjunctivae normal and EOM are normal. Pupils are equal, round, and reactive to light.  Neck: Normal range of motion. Neck supple. No thyromegaly present.  Cardiovascular: Normal rate, regular rhythm, normal heart sounds and intact distal pulses.   Pulmonary/Chest: Effort normal and breath sounds normal.  Abdominal: Soft. Bowel sounds are normal. She exhibits no mass. There is no tenderness.  Musculoskeletal: Normal range of motion.  Lymphadenopathy:    She has no cervical adenopathy.  Neurological: She is alert and oriented to person, place, and time.  Skin: Skin is warm and dry. No  rash noted.  Psychiatric: She has a normal mood and affect. Her behavior is normal.          Assessment & Plan:   Hypertension well controlled Fatigue. We'll check chemistries and a CBC as well as a sedimentation rate Depression. Continue Lexapro. Followup PCP SVT stable Dyslipidemia   CPX with PCP in 6 months All medications renewed Laboratory update

## 2012-06-22 ENCOUNTER — Telehealth: Payer: Self-pay | Admitting: Internal Medicine

## 2012-06-22 NOTE — Telephone Encounter (Signed)
Patient calling to get her lab results from 11/1.  She is feeling better then at the time of her visit.   Glucose was 115 mg but she was not fasting.  Any changes or other suggestions?  Patient plans to see her cardiologist in April 2014.  Doesn't have a f/u scheduled with Dr. Lovell Sheehan.

## 2012-06-22 NOTE — Telephone Encounter (Signed)
Pt informed

## 2012-06-22 NOTE — Telephone Encounter (Signed)
Please call/notify patient that lab/test/procedure is normal F/u Dr Shela Commons in 3-4 months

## 2012-07-02 ENCOUNTER — Encounter: Payer: Self-pay | Admitting: Gynecology

## 2012-07-02 ENCOUNTER — Ambulatory Visit (INDEPENDENT_AMBULATORY_CARE_PROVIDER_SITE_OTHER): Payer: Medicare Other | Admitting: Gynecology

## 2012-07-02 DIAGNOSIS — N764 Abscess of vulva: Secondary | ICD-10-CM

## 2012-07-02 NOTE — Progress Notes (Signed)
Patient presents with several week history of draining right vulvar area. Patient notes that it started several weeks ago is tender in that she tried squeezing it and it drained some pus but continues to be present. There is uncomfortable but not seeming to get worse. No fever chills or other constitutional symptoms.  Exam was Psychologist, [facility] [employer] [facility] External [facility] vagina with 1-1/2 cm fluctuant boil tender to palpation mid labia majora.  No inguinal adenopathy or surrounding evidence of cellulitis. Otherwise atrophic general changes.  Cervix normal with atrophic changes. Uterus normal sized and mobile nontender. Adnexa without masses or tenderness.  Procedure: Overlying skin cleansed with Betadine, 18-gauge needle used with fluctuant area lanced and drained of frank pus. Small seedlike area extruded with complete emptying of boil. Pressure hemostasis applied. Postoperative instructions given. Patient will follow up if persists/recurs.

## 2012-07-02 NOTE — Patient Instructions (Signed)
Use warm sitz baths as needed for tenderness. Follow up if boil recurs.

## 2012-10-05 ENCOUNTER — Encounter: Payer: Self-pay | Admitting: Internal Medicine

## 2012-10-05 ENCOUNTER — Ambulatory Visit (INDEPENDENT_AMBULATORY_CARE_PROVIDER_SITE_OTHER): Payer: Medicare Other | Admitting: Internal Medicine

## 2012-10-05 VITALS — BP 120/66 | HR 52 | Ht 61.0 in | Wt 129.0 lb

## 2012-10-05 DIAGNOSIS — R159 Full incontinence of feces: Secondary | ICD-10-CM

## 2012-10-05 DIAGNOSIS — R1310 Dysphagia, unspecified: Secondary | ICD-10-CM

## 2012-10-05 DIAGNOSIS — K589 Irritable bowel syndrome without diarrhea: Secondary | ICD-10-CM

## 2012-10-05 MED ORDER — PHILLIPS COLON HEALTH PO CAPS: 1.0000 | ORAL_CAPSULE | Freq: Every day | ORAL | Status: DC

## 2012-10-05 NOTE — Patient Instructions (Addendum)
Today you have been given a low fiber diet to use as a guide.  Stop your Flax Seed Oil.  We have given you samples of [place] [employer]. This puts good bacteria back into your colon. You should take 1 capsule by mouth once daily. If this works well for you, it can be purchased over the counter.  Please see your PCP regarding the right flank symptoms your having.  You have been scheduled for an endoscopy with propofol. Please follow written instructions given to you at your visit today. If you use inhalers (even only as needed) or a CPAP machine, please bring them with you on the day of your procedure.  Thank you for choosing me and [facility].  Iva Boop, M.D., [facility]

## 2012-10-05 NOTE — Progress Notes (Signed)
Subjective:    Patient ID: [name], female    DOB: 03/20/1941, 72 y.o.   MRN: [id number]  HPI [name] is here because of recurrent intermittent solid food dysphagia with suprasternal sticking point. It has been a problem for several months or longer.  She is not having heartburn.  She also is having a right flank pain noted upon awakening. It eventually resolves or she is able to ignore it. No GU sxs.  She also has been having an increase in stool frequency and loose stools with intermittent fecal incontinence - urge in nature. No urinary incontinence.She has a hx of similar problems in past. She is on flaxseed oil and also eats a large amount of fresh fruits and vegetables,  Allergies  Allergen Reactions  . Codeine Nausea Only   Outpatient Prescriptions Prior to Visit  Medication Sig Dispense Refill  . BIOTIN PO Take by mouth.      . Calcium Carbonate-Vitamin D (CALCIUM + D PO) Take 1 tablet by mouth daily.        [name] diltiazem (CARDIZEM CD) 240 MG 24 hr capsule Take 1 capsule (240 mg total) by mouth daily.  30 capsule  11  . hydrochlorothiazide (HYDRODIURIL) 25 MG tablet Take 1 tablet (25 mg total) by mouth daily.  30 tablet  11  . irbesartan-hydrochlorothiazide (AVALIDE) 300-12.5 MG per tablet Take 1 tablet by mouth daily.  90 tablet  4  . latanoprost (XALATAN) 0.005 % ophthalmic solution       . niacin 500 MG tablet Take 500 mg by mouth daily with breakfast.        . travoprost, benzalkonium, (TRAVATAN) 0.004 % ophthalmic solution Place 1 drop into both eyes at bedtime.        . Flaxseed, Linseed, (FLAXSEED OIL PO) Take 1 tablet by mouth daily.        [name] escitalopram (LEXAPRO) 20 MG tablet Take 1 tablet (20 mg total) by mouth daily.  30 tablet  6  . Multiple Vitamins-Minerals (ICAPS) CAPS Take 1 capsule by mouth daily.         No facility-administered medications prior to visit.   Past Medical History  Diagnosis Date  . HYPERLIPIDEMIA 09/22/2007  . DEPRESSION 03/11/2007   . 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 CERUMEN IMPACTION, BILATERAL 12/13/2007  . HYPERTENSION 03/11/2007  . DIVERTICULOSIS, COLON 03/11/2007  . Rosacea 03/11/2007  . CERVICAL RADICULOPATHY 11/05/2009  . UNS ADVRS EFF UNS RX MEDICINAL&BIOLOGICAL SBSTNC 11/05/2009  . Arthritis   . Chronic kidney disease     kidney stones  . Kidney stone   . Adenomatous colon polyp 2006   Past Surgical History  Procedure Laterality Date  . Kidney surgery      age 89  . Cervical fusion      C4-6  . Tonsillectomy    . Colonoscopy      multiple  . Anterior cervical decomp/discectomy fusion  08/14/2011    Procedure: ANTERIOR CERVICAL DECOMPRESSION/DISCECTOMY FUSION 1 LEVEL/HARDWARE REMOVAL;  Surgeon: Dorian Heckle, MD;  Location: [facility];  Service: Neurosurgery;  Laterality: N/A;  Cervical Six-Seven anterior cervical decompression with fusion interbody prothesis plating and bonegraft with removal of hardware of Cervical Four to Cervical Six    History   Social History  . Marital Status: Widowed    Spouse Name: N/A    Number of Children: N/A  . Years of Education: N/A   Occupational History  . retired    Social History  Main Topics  . Smoking status: Never Smoker   . Smokeless tobacco: Never Used  . Alcohol Use: No  . Drug Use: No  . Sexually Active: No   Family History  Problem Relation Age of Onset  . Mental illness Mother     comitted suicide  . Lung cancer Father     smoker  . Hypertension Father   . Heart disease Father   . Cerebral palsy Sister   . Diabetes Maternal Grandmother   . Colon cancer Neg Hx   . Stomach cancer Neg Hx     Review of Systems + anxious, allergies, decreased hearing All other ROS negative or as per HPI    Objective:   Physical Exam General:  NAD Eyes:   anicteric Lungs:  clear Heart:  S1S2 no rubs, murmurs or gallops Abdomen:  soft and nontender, BS+ - Right flank tenderness - mild Rectal: Female staff present - + external  tags, absent anal wink, normal resting tone, formed brown stool, no mass, mod-large rectocele, normal squeeze and Valsalva with appropriate abd CTR and   decsent Ext:   no edema    Data Reviewed:   Previous GI notes, colonoscopy, CT    Assessment & Plan:  Dysphagia, unspecified - Plan: Ambulatory referral for EGD and dilation of esophagus. The risks and benefits as well as alternatives of endoscopic procedure(s) have been discussed and reviewed. All questions answered. The patient agrees to proceed.  IBS (irritable bowel syndrome) Plan: probiotic, stop flaxsee oil, reduce fiber - diet instructions provided  Fecal incontinence Plan: as per IBS, may need more aggressive Tx depending upon result to above changes   To see PCP for right flank pain that seems musculoskeletal - does not have GU sxs or lower ext sxs   NF:[id number],VHQI Ramon Dredge, MD

## 2012-10-14 ENCOUNTER — Encounter: Payer: Self-pay | Admitting: Internal Medicine

## 2012-10-14 ENCOUNTER — Ambulatory Visit (AMBULATORY_SURGERY_CENTER): Payer: Medicare Other | Admitting: Internal Medicine

## 2012-10-14 VITALS — BP 128/67 | HR 79 | Temp 97.8°F | Resp 20 | Ht 61.0 in | Wt 129.0 lb

## 2012-10-14 DIAGNOSIS — K222 Esophageal obstruction: Secondary | ICD-10-CM

## 2012-10-14 DIAGNOSIS — K2100 Gastro-esophageal reflux disease with esophagitis, without bleeding: Secondary | ICD-10-CM

## 2012-10-14 DIAGNOSIS — K219 Gastro-esophageal reflux disease without esophagitis: Secondary | ICD-10-CM

## 2012-10-14 DIAGNOSIS — R1310 Dysphagia, unspecified: Secondary | ICD-10-CM

## 2012-10-14 DIAGNOSIS — D130 Benign neoplasm of esophagus: Secondary | ICD-10-CM

## 2012-10-14 DIAGNOSIS — Z8719 Personal history of other diseases of the digestive system: Secondary | ICD-10-CM

## 2012-10-14 HISTORY — DX: Gastro-esophageal reflux disease without esophagitis: K21.9

## 2012-10-14 HISTORY — DX: Personal history of other diseases of the digestive system: Z87.19

## 2012-10-14 HISTORY — PX: ESOPHAGOGASTRODUODENOSCOPY: SHX1529

## 2012-10-14 HISTORY — DX: Gastro-esophageal reflux disease with esophagitis, without bleeding: K21.00

## 2012-10-14 HISTORY — DX: Esophageal obstruction: K22.2

## 2012-10-14 MED ORDER — PANTOPRAZOLE SODIUM 40 MG PO TBEC: 40.0000 mg | DELAYED_RELEASE_TABLET | Freq: Every day | ORAL | Status: DC

## 2012-10-14 MED ORDER — SODIUM CHLORIDE 0.9 % IV SOLN: 500.0000 mL | INTRAVENOUS | Status: DC

## 2012-10-14 NOTE — Op Note (Signed)
[facility] [address] [place], [address]   ENDOSCOPY PROCEDURE REPORT  PATIENT: [name], [name]  MR#: [id number] BIRTHDATE: [birth year] , 71  yrs. old GENDER: Female ENDOSCOPIST: Iva Boop, MD, [facility] PROCEDURE DATE:  10/14/2012 PROCEDURE:  EGD w/ biopsy and Maloney dilation of esophagus ASA CLASS:     Class II INDICATIONS:  Dysphagia. MEDICATIONS: propofol (Diprivan) 150mg  IV, MAC sedation, administered by CRNA, and These medications were titrated to patient response per physician's verbal order TOPICAL ANESTHETIC: Cetacaine Spray  DESCRIPTION OF PROCEDURE: After the risks benefits and alternatives of the procedure were thoroughly explained, informed consent was obtained.  The LB [id number]-H180 [id number] endoscope was introduced through the mouth and advanced to the second portion of the duodenum. Without limitations.  The instrument was slowly withdrawn as the mucosa was fully examined.        ESOPHAGUS: A stricture was found at the gastroesophageal junction. Ring-like. The stenosis was traversable with the endoscope. Multiple biopsies were performed using cold forceps.  Sample sent for histology.   Reflux esophagitis was found at the gastroesophageal junction. One tiny erosion and edematous folds. . Multiple biopsies were performed using cold forceps.  Sample sent for histology.  The remainder of the upper endoscopy exam was otherwise normal. Retroflexed views revealed no abnormalities.     The scope was then withdrawn from the patient and the procedure completed.  COMPLICATIONS: There were no complications. ENDOSCOPIC IMPRESSION: 1.   Stricture was found at the gastroesophageal junction; multiple biopsies 2.   Esophagitis consistent with reflux esophagitis at the gastroesophageal junction; multiple biopsies 3.   The remainder of the upper endoscopy exam was otherwise normal  RECOMMENDATIONS: 1.  Clear liquids until 130 PM , then soft foods rest  of day. Resume prior diet tomorrow - with GERD restrictions 2.  PPI qam - pantoprazole 40 mg prescribed 3.  Call for appointment to see me in early May  eSigned:  Iva Boop, MD, [facility] 10/14/2012 12:33 PM   CC:The Patient

## 2012-10-14 NOTE — Patient Instructions (Addendum)
There were signs of acid reflux inflammation and a stricture (narrow area) where the esophagus and stomach meet. This is where the food is stopping.  I took biopsies but do not think there is any cancer. I will let you know by mail.  Please follow the special diet instructions today and in the future for GERD and also pick up pantoprazole medication to help treat this problem by blocking stomach acid.  I would like to see you back in the office in early May - please make an appointment.  Thank you for choosing me and [facility].  Iva Boop, MD, FACG  YOU HAD AN ENDOSCOPIC PROCEDURE TODAY AT THE [facility]: Refer to the procedure report that was given to you for any specific questions about what was found during the examination.  If the procedure report does not answer your questions, please call your gastroenterologist to clarify.  If you requested that your care partner not be given the details of your procedure findings, then the procedure report has been included in a sealed envelope for you to review at your convenience later.  YOU SHOULD EXPECT: Some feelings of bloating in the abdomen. Passage of more gas than usual.  Walking can help get rid of the air that was put into your GI tract during the procedure and reduce the bloating. If you had a lower endoscopy (such as a colonoscopy or flexible sigmoidoscopy) you may notice spotting of blood in your stool or on the toilet paper. If you underwent a bowel prep for your procedure, then you may not have a normal bowel movement for a few days.  DIET: See Dilation diet-   Drink plenty of fluids but you should avoid alcoholic beverages for 24 hours.  ACTIVITY: Your care partner should take you home directly after the procedure.  You should plan to take it easy, moving slowly for the rest of the day.  You can resume normal activity the day after the procedure however you should NOT DRIVE or use heavy machinery for 24  hours (because of the sedation medicines used during the test).    SYMPTOMS TO REPORT IMMEDIATELY: A gastroenterologist can be reached at any hour.  During normal business hours, 8:30 AM to 5:00 PM Monday through Friday, call [phone].  After hours and on weekends, please call the GI answering service at [phone] who will take a message and have the physician on call contact you.  Following upper endoscopy (EGD)  Vomiting of blood or coffee ground material  New chest pain or pain under the shoulder blades  Painful or persistently difficult swallowing  New shortness of breath  Fever of 100F or higher  Black, tarry-looking stools  FOLLOW UP: If any biopsies were taken you will be contacted by phone or by letter within the next 1-3 weeks.  Call your gastroenterologist if you have not heard about the biopsies in 3 weeks.  Our staff will call the home number listed on your records the next business day following your procedure to check on you and address any questions or concerns that you may have at that time regarding the information given to you following your procedure. This is a courtesy call and so if there is no answer at the home number and we have not heard from you through the emergency physician on call, we will assume that you have returned to your regular daily activities without incident.  SIGNATURES/CONFIDENTIALITY: You and/or your care partner have signed  paperwork which will be entered into your electronic medical record.  These signatures attest to the fact that that the information above on your After Visit Summary has been reviewed and is understood.  Full responsibility of the confidentiality of this discharge information lies with you and/or your care-partner.  Dilation Diet-handout given  GERD- handout given

## 2012-10-14 NOTE — Progress Notes (Signed)
Called to room to assist during endoscopic procedure.  Patient ID and intended procedure confirmed with present staff. Received instructions for my participation in the procedure from the performing physician.  

## 2012-10-14 NOTE — Progress Notes (Signed)
Patient did not experience any of the following events: a burn prior to discharge; a fall within the facility; wrong site/side/patient/procedure/implant event; or a hospital transfer or hospital admission upon discharge from the facility. (G8907) Patient did not have preoperative order for IV antibiotic SSI prophylaxis. (G8918)  

## 2012-10-15 ENCOUNTER — Telehealth: Payer: Self-pay

## 2012-10-15 NOTE — Telephone Encounter (Signed)
  Follow up Call-  Call back number 10/14/2012 11/28/2011  Post procedure Call Back phone  # [phone]  Permission to leave phone message Yes Yes     Patient questions:  Do you have a fever, pain , or abdominal swelling? no Pain Score  0 *  Have you tolerated food without any problems? yes  Have you been able to return to your normal activities? yes  Do you have any questions about your discharge instructions: Diet   no Medications  no Follow up visit  no  Do you have questions or concerns about your Care? no  Actions: * If pain score is 4 or above: No action needed, pain <4.

## 2012-10-18 ENCOUNTER — Encounter: Payer: Self-pay | Admitting: Gynecology

## 2012-10-20 ENCOUNTER — Encounter: Payer: Self-pay | Admitting: Internal Medicine

## 2012-10-20 NOTE — Progress Notes (Signed)
Quick Note:  Benign stricture ______

## 2012-10-28 ENCOUNTER — Other Ambulatory Visit: Payer: Self-pay | Admitting: Internal Medicine

## 2012-11-15 ENCOUNTER — Ambulatory Visit (INDEPENDENT_AMBULATORY_CARE_PROVIDER_SITE_OTHER): Payer: Medicare Other | Admitting: Internal Medicine

## 2012-11-15 ENCOUNTER — Encounter: Payer: Self-pay | Admitting: Internal Medicine

## 2012-11-15 VITALS — BP 142/65 | HR 61 | Ht 61.5 in | Wt 129.8 lb

## 2012-11-15 DIAGNOSIS — E785 Hyperlipidemia, unspecified: Secondary | ICD-10-CM

## 2012-11-15 DIAGNOSIS — I498 Other specified cardiac arrhythmias: Secondary | ICD-10-CM

## 2012-11-15 DIAGNOSIS — I471 Supraventricular tachycardia: Secondary | ICD-10-CM

## 2012-11-15 DIAGNOSIS — I10 Essential (primary) hypertension: Secondary | ICD-10-CM

## 2012-11-15 NOTE — Progress Notes (Signed)
PCP: Carrie Mew, MD  [name] is a 71 y.o. female who presents today for routine electrophysiology followup.  Since last being seen in our clinic, the patient reports doing very well.  She denies any significant symptoms of SVT.  Today, she denies symptoms of palpitations, chest pain, shortness of breath,  lower extremity edema, dizziness, presyncope, or syncope.  The patient is otherwise without complaint today.   Past Medical History  Diagnosis Date  . HYPERLIPIDEMIA 09/22/2007  . DEPRESSION 03/11/2007  . 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 CERUMEN IMPACTION, BILATERAL 12/13/2007  . HYPERTENSION 03/11/2007  . DIVERTICULOSIS, COLON 03/11/2007  . Rosacea 03/11/2007  . CERVICAL RADICULOPATHY 11/05/2009  . UNS ADVRS EFF UNS RX MEDICINAL&BIOLOGICAL SBSTNC 11/05/2009  . Arthritis   . Chronic kidney disease     kidney stones  . Kidney stone   . Adenomatous colon polyp 2006  . Tachycardia   . Anxiety    Past Surgical History  Procedure Laterality Date  . Kidney surgery      age 46  . Cervical fusion      C4-6  . Tonsillectomy    . Colonoscopy      multiple  . Anterior cervical decomp/discectomy fusion  08/14/2011    Procedure: ANTERIOR CERVICAL DECOMPRESSION/DISCECTOMY FUSION 1 LEVEL/HARDWARE REMOVAL;  Surgeon: Dorian Heckle, MD;  Location: [facility];  Service: Neurosurgery;  Laterality: N/A;  Cervical Six-Seven anterior cervical decompression with fusion interbody prothesis plating and bonegraft with removal of hardware of Cervical Four to Cervical Six     Current Outpatient Prescriptions  Medication Sig Dispense Refill  . BIOTIN PO Take by mouth.      . Calcium Carbonate-Vitamin D (CALCIUM + D PO) Take 1 tablet by mouth daily.        [name] diltiazem (CARDIZEM CD) 240 MG 24 hr capsule Take 1 capsule (240 mg total) by mouth daily.  30 capsule  11  . escitalopram (LEXAPRO) 20 MG tablet TAKE 1 TABLET BY MOUTH DAILY  90 tablet  6  . hydrochlorothiazide  (HYDRODIURIL) 25 MG tablet Take 1 tablet (25 mg total) by mouth daily.  30 tablet  11  . irbesartan-hydrochlorothiazide (AVALIDE) 300-12.5 MG per tablet Take 1 tablet by mouth daily.  90 tablet  4  . niacin 500 MG tablet Take 500 mg by mouth daily with breakfast.        . pantoprazole (PROTONIX) 40 MG tablet Take 1 tablet (40 mg total) by mouth daily before breakfast.  30 tablet  11  . Probiotic Product (PHILLIPS COLON HEALTH) CAPS Take 1 capsule by mouth daily.  30 capsule  0  . timolol (TIMOPTIC) 0.5 % ophthalmic solution        No current facility-administered medications for this visit.    Physical Exam: Filed Vitals:   11/15/12 1140  BP: 142/65  Pulse: 61  Height: 5' 1.5" (1.562 m)  Weight: 129 lb 12.8 oz (58.877 kg)    GEN- The patient is well appearing, alert and oriented x 3 today.   Head- normocephalic, atraumatic Eyes-  Sclera clear, conjunctiva pink Ears- hearing intact Oropharynx- clear Lungs- Clear to ausculation bilaterally, normal work of breathing Heart- Regular rate and rhythm, no murmurs, rubs or gallops, PMI not laterally displaced GI- soft, NT, ND, + BS Extremities- no clubbing, cyanosis, or edema   Assessment and Plan:

## 2012-11-15 NOTE — Patient Instructions (Addendum)
Your physician recommends that you return for lab work in:  Fasting labs on 11/19/2012.  BMET, Liver, Lipid  Your physician wants you to follow-up in: 12 months with Dr. Johney Frame, You will receive a reminder letter in the mail two months in advance. If you don't receive a letter, please call our office to schedule the follow-up appointment.  Your physician recommends that you continue on your current medications as directed. Please refer to the Current Medication list given to you today.

## 2012-11-15 NOTE — Assessment & Plan Note (Signed)
Well controlled. No changes. 

## 2012-11-15 NOTE — Assessment & Plan Note (Signed)
Stable No change required today  bmet

## 2012-11-15 NOTE — Assessment & Plan Note (Signed)
Stable No change required today  Check fasting lipids and LFTs

## 2012-11-19 ENCOUNTER — Ambulatory Visit (INDEPENDENT_AMBULATORY_CARE_PROVIDER_SITE_OTHER): Payer: Medicare Other | Admitting: *Deleted

## 2012-11-19 DIAGNOSIS — E785 Hyperlipidemia, unspecified: Secondary | ICD-10-CM

## 2012-11-19 DIAGNOSIS — I10 Essential (primary) hypertension: Secondary | ICD-10-CM

## 2012-11-19 LAB — HEPATIC FUNCTION PANEL
Alkaline Phosphatase: 57 U/L (ref 39–117)
Bilirubin, Direct: 0.1 mg/dL (ref 0.0–0.3)
Total Bilirubin: 1 mg/dL (ref 0.3–1.2)
Total Protein: 6.9 g/dL (ref 6.0–8.3)

## 2012-11-19 LAB — BASIC METABOLIC PANEL
CO2: 27 mEq/L (ref 19–32)
Calcium: 9 mg/dL (ref 8.4–10.5)
Creatinine, Ser: 0.7 mg/dL (ref 0.4–1.2)
Sodium: 136 mEq/L (ref 135–145)

## 2012-11-22 LAB — LDL CHOLESTEROL, DIRECT: Direct LDL: 151 mg/dL

## 2012-11-22 LAB — LIPID PANEL
HDL: 37.7 mg/dL — ABNORMAL LOW (ref >39.00)
Total CHOL/HDL Ratio: 6
Triglycerides: 137 mg/dL (ref 0.0–149.0)
VLDL: 27.4 mg/dL (ref 0.0–40.0)

## 2012-12-13 ENCOUNTER — Encounter: Payer: Self-pay | Admitting: Internal Medicine

## 2012-12-13 ENCOUNTER — Ambulatory Visit (INDEPENDENT_AMBULATORY_CARE_PROVIDER_SITE_OTHER): Payer: Medicare Other | Admitting: Internal Medicine

## 2012-12-13 VITALS — BP 122/70 | HR 77 | Ht 59.5 in | Wt 131.0 lb

## 2012-12-13 DIAGNOSIS — K222 Esophageal obstruction: Secondary | ICD-10-CM

## 2012-12-13 DIAGNOSIS — K219 Gastro-esophageal reflux disease without esophagitis: Secondary | ICD-10-CM

## 2012-12-13 NOTE — Patient Instructions (Addendum)
Stay on your pantoprazole.  Follow up with [place] as needed.   Thank you for choosing me and [facility].  Iva Boop, M.D., [facility]

## 2012-12-13 NOTE — Progress Notes (Signed)
  Subjective:    Patient ID: [name], female    DOB: 08/22/1940, 72 y.o.   MRN: [id number]  HPI No dysphagia or heartburn since EGD and dilation in March. On pantoprazole 40 mg daily w/o problems.  Medications, allergies, past medical history, past surgical history, family history and social history are reviewed and updated in the EMR.   Review of Systems As above    Objective:   Physical Exam NAD   Assessment & Plan:  Esophageal stricture  GERD (gastroesophageal reflux disease)  Doing well - continue PPI and see me as needed I discussed the possible bone loss issues w/ PPI and that I think risk/benefit favors chronic PPI

## 2012-12-31 ENCOUNTER — Telehealth: Payer: Self-pay | Admitting: Internal Medicine

## 2012-12-31 NOTE — Telephone Encounter (Signed)
Patient having loose stool and multiple stools.  She is advised to try imodium as needed.  She is given an appt for 01/26/13 with Dr. Leone Payor.  She will let us know if she wants an earlier appt

## 2013-01-26 ENCOUNTER — Encounter: Payer: Self-pay | Admitting: Internal Medicine

## 2013-01-26 ENCOUNTER — Ambulatory Visit (INDEPENDENT_AMBULATORY_CARE_PROVIDER_SITE_OTHER): Payer: Medicare Other | Admitting: Internal Medicine

## 2013-01-26 VITALS — BP 100/60 | HR 72 | Ht 61.0 in | Wt 132.5 lb

## 2013-01-26 DIAGNOSIS — R159 Full incontinence of feces: Secondary | ICD-10-CM

## 2013-01-26 DIAGNOSIS — K589 Irritable bowel syndrome without diarrhea: Secondary | ICD-10-CM

## 2013-01-26 DIAGNOSIS — R152 Fecal urgency: Secondary | ICD-10-CM

## 2013-01-26 MED ORDER — LOPERAMIDE HCL 2 MG PO CAPS: 2.0000 mg | ORAL_CAPSULE | Freq: Two times a day (BID) | ORAL | Status: DC | PRN

## 2013-01-26 NOTE — Assessment & Plan Note (Signed)
She will try daily or intermittent loperamide to see if that makes a difference in control things. We discussed the possibility of using alosetron, but after hearing the potential side effects she elected to try loperamide first. Keep alosetron in reserve. He has glaucoma so I don't think anticholinergic such as dicyclomine would be a good better but she is elderly and the potential side effects could be a problem. She will call me back if this approach with the loperamide is not working. Low-residue diet provided for the patient as well.

## 2013-01-26 NOTE — Progress Notes (Signed)
  Subjective:    Patient ID: [name], female    DOB: 04/14/1941, 72 y.o.   MRN: [id number]  HPI Patient is here in followup. She has irritable bowel syndrome. When I saw her in February was complaining of increasing frequency of stools many of which were loose and urgency as well as urge fecal incontinence. Rectal exam was performed at that time and showed no major abnormalities though she did have a rectocele. She's had problems for years. She is concerned because she sometimes has fecal incontinence when she's in public. It is an intermittent problem without clear triggers. She did stop the flaxseed oil she was taking that she probably has not back off for reduced fresh fruits and vegetables as I recommended to try to reduce loose stools. She does not use sugar-free substitutes border she had problems with milk or dairy products she really does not use them. She is getting ready for a trip to [place] and is particularly concerned about going out to eat when she is air.  Medications, allergies, past medical history, past surgical history, family history and social history are reviewed and updated in the EMR.  Review of Systems As above, dysphagia is resolved    Objective:   Physical Exam Well-developed well-nourished woman in no acute distress Abdomen is soft and nontender without organomegaly or mass Normal mood and affect      Assessment & Plan:   1. IBS (irritable bowel syndrome)- diarrhea predominant   2. Fecal urgency   3. Fecal incontinence

## 2013-01-26 NOTE — Patient Instructions (Addendum)
Please purchase Imodium over the counter and adjust the dose as needed.  Start with 1 tablet at a time , may decrease to 1/2 tablet size.  See what works best.   Try not to eat too much fiber.  We will provide you with a low residue  Diet handout to read and follow.   I appreciate the opportunity to care for you.

## 2013-06-10 ENCOUNTER — Ambulatory Visit (INDEPENDENT_AMBULATORY_CARE_PROVIDER_SITE_OTHER): Payer: Medicare Other

## 2013-06-10 DIAGNOSIS — Z23 Encounter for immunization: Secondary | ICD-10-CM

## 2013-06-22 ENCOUNTER — Other Ambulatory Visit: Payer: Self-pay | Admitting: Internal Medicine

## 2013-07-08 ENCOUNTER — Other Ambulatory Visit: Payer: Self-pay | Admitting: Internal Medicine

## 2013-08-09 ENCOUNTER — Other Ambulatory Visit: Payer: Self-pay | Admitting: Internal Medicine

## 2013-08-10 ENCOUNTER — Other Ambulatory Visit: Payer: Self-pay | Admitting: *Deleted

## 2013-08-16 ENCOUNTER — Telehealth: Payer: Self-pay | Admitting: Internal Medicine

## 2013-08-16 NOTE — Telephone Encounter (Signed)
Patient reports abdominal pain since Saturday.  She denies diarrhea, nausea, or fever.  She reports that she is tender and is having back pain.  She reports she is tender all over her stomach.  She has no other GI complaints at this time.  She will come in and see Nicoletta Ba PA tomorrow at 145

## 2013-08-17 ENCOUNTER — Ambulatory Visit (INDEPENDENT_AMBULATORY_CARE_PROVIDER_SITE_OTHER): Payer: Medicare Other | Admitting: Physician Assistant

## 2013-08-17 ENCOUNTER — Other Ambulatory Visit (INDEPENDENT_AMBULATORY_CARE_PROVIDER_SITE_OTHER): Payer: Medicare Other

## 2013-08-17 ENCOUNTER — Encounter: Payer: Self-pay | Admitting: Physician Assistant

## 2013-08-17 VITALS — BP 118/80 | HR 78 | Ht 61.0 in | Wt 126.2 lb

## 2013-08-17 DIAGNOSIS — K5792 Diverticulitis of intestine, part unspecified, without perforation or abscess without bleeding: Secondary | ICD-10-CM

## 2013-08-17 DIAGNOSIS — K5732 Diverticulitis of large intestine without perforation or abscess without bleeding: Secondary | ICD-10-CM

## 2013-08-17 DIAGNOSIS — R1032 Left lower quadrant pain: Secondary | ICD-10-CM

## 2013-08-17 LAB — CBC WITH DIFFERENTIAL/PLATELET
BASOS ABS: 0 10*3/uL (ref 0.0–0.1)
BASOS PCT: 0.4 % (ref 0.0–3.0)
EOS PCT: 1.2 % (ref 0.0–5.0)
Eosinophils Absolute: 0.1 10*3/uL (ref 0.0–0.7)
HCT: 38.7 % (ref 36.0–46.0)
Hemoglobin: 13 g/dL (ref 12.0–15.0)
LYMPHS ABS: 2.5 10*3/uL (ref 0.7–4.0)
LYMPHS PCT: 30.1 % (ref 12.0–46.0)
MCHC: 33.6 g/dL (ref 30.0–36.0)
MCV: 91.9 fl (ref 78.0–100.0)
MONO ABS: 0.7 10*3/uL (ref 0.1–1.0)
Monocytes Relative: 8.8 % (ref 3.0–12.0)
NEUTROS PCT: 59.5 % (ref 43.0–77.0)
Neutro Abs: 5 10*3/uL (ref 1.4–7.7)
Platelets: 340 10*3/uL (ref 150.0–400.0)
RBC: 4.21 Mil/uL (ref 3.87–5.11)
RDW: 12.8 % (ref 11.5–14.6)
WBC: 8.4 10*3/uL (ref 4.5–10.5)

## 2013-08-17 LAB — BASIC METABOLIC PANEL WITH GFR
BUN: 19 mg/dL (ref 6–23)
CO2: 31 meq/L (ref 19–32)
Calcium: 9.8 mg/dL (ref 8.4–10.5)
Chloride: 97 meq/L (ref 96–112)
Creatinine, Ser: 0.8 mg/dL (ref 0.4–1.2)
GFR: 73.34 mL/min
Glucose, Bld: 100 mg/dL — ABNORMAL HIGH (ref 70–99)
Potassium: 3.5 meq/L (ref 3.5–5.1)
Sodium: 138 meq/L (ref 135–145)

## 2013-08-17 MED ORDER — CIPROFLOXACIN HCL 500 MG PO TABS: 500.0000 mg | ORAL_TABLET | Freq: Two times a day (BID) | ORAL | Status: AC

## 2013-08-17 MED ORDER — METRONIDAZOLE 500 MG PO TABS: 500.0000 mg | ORAL_TABLET | Freq: Two times a day (BID) | ORAL | Status: AC

## 2013-08-17 NOTE — Progress Notes (Signed)
Subjective:    Patient ID: [name], female    DOB: 29-Aug-1940, 73 y.o.   MRN: [id number]  HPI  Ilene is a very nice 73 your old white female known to Dr. Carlean Purl. She has history of esophageal stricture, IBS and diverticulosis in addition to a history of colon polyps, hypertension, and depression. Patient last had colonoscopy in April of 2013 which showed severe diverticulosis throughout the colon and a 7 mm polyp which was removed and was found to be a tubular adenoma. She also had internal/external hemorrhoids. She comes in today with complaints of acute onset of lower abdominal pain this past Friday evening. She says pain has been constant since and is now radiating throughout her abdomen. She said she been in bed the past couple of days because she was hurting so bad. She's not had any associated fever, chills or sweats, no diarrhea ,constipation ,melena or hematochezia. No nausea or vomiting. She says her appetite has been significantly decreased. She has been able to have bowel movements but says this has not ease the pain. Next no complaints of dysuria or hematuria. She says she's felt a little bit better over the past 24 hours and has been trying to push fluids. She feels that this pain is very different than her usual verbal bowel type symptoms. She is specifically asking for CT scan because she feels that something is wrong.    Review of Systems  Constitutional: Positive for appetite change and fatigue.  HENT: Negative.   Eyes: Negative.   Respiratory: Negative.   Cardiovascular: Negative.   Gastrointestinal: Positive for abdominal pain.  Endocrine: Negative.   Genitourinary: Negative.   Musculoskeletal: Negative.   Skin: Negative.   Allergic/Immunologic: Negative.   Neurological: Negative.   Hematological: Negative.   Psychiatric/Behavioral: Negative.    Outpatient Prescriptions Prior to Visit  Medication Sig Dispense Refill  . BIOTIN PO Take by mouth.      .  Calcium Carbonate-Vitamin D (CALCIUM + D PO) Take 1 tablet by mouth daily.        [name] CARTIA XT 240 MG 24 hr capsule TAKE 1 CAPSULE BY MOUTH EVERY DAY  30 capsule  0  . escitalopram (LEXAPRO) 20 MG tablet TAKE 1 TABLET BY MOUTH DAILY  90 tablet  6  . hydrochlorothiazide (HYDRODIURIL) 25 MG tablet TAKE 1 TABLET BY MOUTH EVERY DAY  90 tablet  0  . irbesartan-hydrochlorothiazide (AVALIDE) 300-12.5 MG per tablet Take 1 tablet by mouth daily.  90 tablet  4  . loperamide (IMODIUM) 2 MG capsule Take 1 capsule (2 mg total) by mouth 2 (two) times daily as needed for diarrhea or loose stools.  30 capsule  0  . niacin 500 MG tablet Take 500 mg by mouth daily with breakfast.        . pantoprazole (PROTONIX) 40 MG tablet Take 1 tablet (40 mg total) by mouth daily before breakfast.  30 tablet  11  . Probiotic Product ([place]) CAPS Take 1 capsule by mouth daily.  30 capsule  0  . timolol (TIMOPTIC) 0.5 % ophthalmic solution        No facility-administered medications prior to visit.   Allergies  Allergen Reactions  . Codeine Nausea Only       Patient Active Problem List   Diagnosis Date Noted  . IBS (irritable bowel syndrome)- diarrhea predominant 01/26/2013  . Personal history of colonic polyps 11/28/2011  . Kidney stone   . SVT (supraventricular tachycardia) 08/14/2011  .  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 CERVICAL RADICULOPATHY 11/05/2009  . UNS ADVRS EFF UNS RX MEDICINAL&BIOLOGICAL SBSTNC 11/05/2009  . HYPERLIPIDEMIA 09/22/2007  . DEPRESSION 03/11/2007  . HYPERTENSION 03/11/2007  . HEMORRHOIDS, EXTERNAL W/O COMPLICATION 78/29/5621  . ROSACEA 03/11/2007   History  Substance Use Topics  . Smoking status: Never Smoker   . Smokeless tobacco: Never Used  . Alcohol Use: No   family history includes Cerebral palsy in her sister; Diabetes in her maternal grandmother; Heart disease in her father; Hypertension in her father; Lung cancer in her father; Mental  illness in her mother. There is no history of Colon cancer or Stomach cancer.  Objective:   Physical Exam  Well-developed older white female in no acute distress, pleasant blood pressure 118/80 pulse 78 height 5 foot 1 weight 126. HEENT; nontraumatic normocephalic EOMI PERRLA sclera anicteric, Supple ;no JVD, Cardiovascular; regular rate and rhythm with S1-S2 no murmur or gallop, Pulmonary; clear bilaterally, Abdomen; soft she has mild rather diffuse tenderness but is much more markedly tender in the left mid and left lower quadrant there is no guarding or rebound no palpable mass or hepatosplenomegaly, Rectal; exam not done, Extremities; no clubbing cyanosis or edema skin warm and dry, Psych; mood and affect appropriate        Assessment & Plan:  #1  73 yo old female with acute lower abdominal pain x5 days,  constant and now radiating throughout her abdomen. Exam and history are most consistent with acute diverticulitis #2 history of IBS #3 history of esophageal stricture no current complaints of dysphagia #4 hypertension #5 hyperlipidemia #6 history of ureteral lithiasis  Plan; CBC with differential and BMET   Schedule for CT scan of the abdomen and pelvis with contrast Start Cipro 500 mg by mouth twice daily x14 days Start Flagyl 500 mg by mouth twice daily x14 days Offered an analgesic but she will use Tylenol as needed Further plans pending results of CT

## 2013-08-17 NOTE — Patient Instructions (Signed)
Please go to the basement level to have your labs drawn.   We sent the prescriptions to [facility], [place].  1. Cipro 2. Metronidazole   You have been scheduled for a CT scan of the abdomen and pelvis at [facility] ([address]---this is in the same building as Press [facility]).   You are scheduled on 08-18-2013 at 9:00 am . You should arrive at 8:45 am  prior to your appointment time for registration. Please follow the written instructions below on the day of your exam:  WARNING: IF YOU ARE ALLERGIC TO IODINE/X-RAY DYE, PLEASE NOTIFY RADIOLOGY IMMEDIATELY AT [phone]! YOU WILL BE GIVEN A 13 HOUR PREMEDICATION PREP.  1) Do not eat or drink anything after 5:00 am  (4 hours prior to your test) 2) You have been given 2 bottles of oral contrast to drink. The solution may taste better if refrigerated, but do NOT add ice or any other liquid to this solution. Shake well before drinking.    Drink 1 bottle of contrast @ 7:00 am  (2 hours prior to your exam)  Drink 1 bottle of contrast @ 8:00 am  (1 hour prior to your exam)  You may take any medications as prescribed with a small amount of water except for the following: Metformin, Glucophage, Glucovance, Avandamet, Riomet, Fortamet, Actoplus Met, Janumet, Glumetza or Metaglip. The above medications must be held the day of the exam AND 48 hours after the exam.  The purpose of you drinking the oral contrast is to aid in the visualization of your intestinal tract. The contrast solution may cause some diarrhea. Before your exam is started, you will be given a small amount of fluid to drink. Depending on your individual set of symptoms, you may also receive an intravenous injection of x-ray contrast/dye. Plan on being at [facility] for 30 minutes or long, depending on the type of exam you are having performed.  If you have any questions regarding your exam or if you need to reschedule, you may call the CT department at  [phone] between the hours of 8:00 am and 5:00 pm, Monday-Friday.  ________________________________________________________________________

## 2013-08-18 ENCOUNTER — Ambulatory Visit (INDEPENDENT_AMBULATORY_CARE_PROVIDER_SITE_OTHER)
Admission: RE | Admit: 2013-08-18 | Discharge: 2013-08-18 | Disposition: A | Discharge status: Home / Self Care | Payer: Medicare Other | Source: Ambulatory Visit | Attending: Physician Assistant | Admitting: Physician Assistant

## 2013-08-18 DIAGNOSIS — K5792 Diverticulitis of intestine, part unspecified, without perforation or abscess without bleeding: Secondary | ICD-10-CM

## 2013-08-18 DIAGNOSIS — K5732 Diverticulitis of large intestine without perforation or abscess without bleeding: Secondary | ICD-10-CM

## 2013-08-18 DIAGNOSIS — R1032 Left lower quadrant pain: Secondary | ICD-10-CM

## 2013-08-18 MED ORDER — IOHEXOL 300 MG/ML  SOLN
100.0000 mL | Freq: Once | INTRAMUSCULAR | Status: AC | PRN
  Administered 2013-08-18: 100 mL via INTRAVENOUS

## 2013-08-18 NOTE — Progress Notes (Signed)
Agree with Ms. Genia Harold assessment and plan.  CT did show probably right-side diverticulitis.  Gatha Mayer, MD, [facility]

## 2013-09-08 ENCOUNTER — Other Ambulatory Visit: Payer: Self-pay | Admitting: Internal Medicine

## 2013-09-13 ENCOUNTER — Other Ambulatory Visit: Payer: Self-pay | Admitting: Dermatology

## 2013-09-27 ENCOUNTER — Ambulatory Visit: Payer: Medicare Other | Admitting: Family

## 2013-10-04 ENCOUNTER — Ambulatory Visit: Payer: Medicare Other | Admitting: Family

## 2013-10-05 ENCOUNTER — Other Ambulatory Visit: Payer: Self-pay | Admitting: Internal Medicine

## 2013-10-06 ENCOUNTER — Ambulatory Visit: Payer: Medicare Other | Admitting: Family

## 2013-10-10 ENCOUNTER — Encounter: Payer: Self-pay | Admitting: Family

## 2013-10-10 ENCOUNTER — Ambulatory Visit: Payer: Medicare Other | Admitting: Family

## 2013-10-10 ENCOUNTER — Ambulatory Visit (INDEPENDENT_AMBULATORY_CARE_PROVIDER_SITE_OTHER): Payer: Medicare Other | Admitting: Family

## 2013-10-10 VITALS — BP 124/82 | HR 87 | Ht 59.75 in | Wt 130.0 lb

## 2013-10-10 DIAGNOSIS — H409 Unspecified glaucoma: Secondary | ICD-10-CM

## 2013-10-10 DIAGNOSIS — E785 Hyperlipidemia, unspecified: Secondary | ICD-10-CM

## 2013-10-10 DIAGNOSIS — I10 Essential (primary) hypertension: Secondary | ICD-10-CM

## 2013-10-10 DIAGNOSIS — F3289 Other specified depressive episodes: Secondary | ICD-10-CM

## 2013-10-10 DIAGNOSIS — F32A Depression, unspecified: Secondary | ICD-10-CM

## 2013-10-10 DIAGNOSIS — F329 Major depressive disorder, single episode, unspecified: Secondary | ICD-10-CM

## 2013-10-10 LAB — HEPATIC FUNCTION PANEL
ALBUMIN: 4 g/dL (ref 3.5–5.2)
ALT: 21 U/L (ref 0–35)
AST: 17 U/L (ref 0–37)
Alkaline Phosphatase: 53 U/L (ref 39–117)
Bilirubin, Direct: 0.1 mg/dL (ref 0.0–0.3)
TOTAL PROTEIN: 6.6 g/dL (ref 6.0–8.3)
Total Bilirubin: 1 mg/dL (ref 0.3–1.2)

## 2013-10-10 LAB — LIPID PANEL
CHOLESTEROL: 194 mg/dL (ref 0–200)
HDL: 35.8 mg/dL — AB (ref >39.00)
LDL Cholesterol: 82 mg/dL (ref 0–99)
TRIGLYCERIDES: 380 mg/dL — AB (ref 0.0–149.0)
Total CHOL/HDL Ratio: 5
VLDL: 76 mg/dL — AB (ref 0.0–40.0)

## 2013-10-10 NOTE — Patient Instructions (Signed)
Hypertension As your heart beats, it forces blood through your arteries. This force is your blood pressure. If the pressure is too high, it is called hypertension (HTN) or high blood pressure. HTN is dangerous because you may have it and not know it. High blood pressure may mean that your heart has to work harder to pump blood. Your arteries may be narrow or stiff. The extra work puts you at risk for heart disease, stroke, and other problems.  Blood pressure consists of two numbers, a higher number over a lower, 110/72, for example. It is stated as "110 over 72." The ideal is below 120 for the top number (systolic) and under 80 for the bottom (diastolic). Write down your blood pressure today. You should pay close attention to your blood pressure if you have certain conditions such as:  Heart failure.  Prior heart attack.  Diabetes  Chronic kidney disease.  Prior stroke.  Multiple risk factors for heart disease. To see if you have HTN, your blood pressure should be measured while you are seated with your arm held at the level of the heart. It should be measured at least twice. A one-time elevated blood pressure reading (especially in the Emergency Department) does not mean that you need treatment. There may be conditions in which the blood pressure is different between your right and left arms. It is important to see your caregiver soon for a recheck. Most people have essential hypertension which means that there is not a specific cause. This type of high blood pressure may be lowered by changing lifestyle factors such as:  Stress.  Smoking.  Lack of exercise.  Excessive weight.  Drug/tobacco/alcohol use.  Eating less salt. Most people do not have symptoms from high blood pressure until it has caused damage to the body. Effective treatment can often prevent, delay or reduce that damage. TREATMENT  When a cause has been identified, treatment for high blood pressure is directed at the  cause. There are a large number of medications to treat HTN. These fall into several categories, and your caregiver will help you select the medicines that are best for you. Medications may have side effects. You should review side effects with your caregiver. If your blood pressure stays high after you have made lifestyle changes or started on medicines,   Your medication(s) may need to be changed.  Other problems may need to be addressed.  Be certain you understand your prescriptions, and know how and when to take your medicine.  Be sure to follow up with your caregiver within the time frame advised (usually within two weeks) to have your blood pressure rechecked and to review your medications.  If you are taking more than one medicine to lower your blood pressure, make sure you know how and at what times they should be taken. Taking two medicines at the same time can result in blood pressure that is too low. SEEK IMMEDIATE MEDICAL CARE IF:  You develop a severe headache, blurred or changing vision, or confusion.  You have unusual weakness or numbness, or a faint feeling.  You have severe chest or abdominal pain, vomiting, or breathing problems. MAKE SURE YOU:   Understand these instructions.  Will watch your condition.  Will get help right away if you are not doing well or get worse. Document Released: 07/28/2005 Document Revised: 10/20/2011 Document Reviewed: 03/17/2008 [facility] Patient Information 2014 [place]. Hypertriglyceridemia  Diet for High blood levels of Triglycerides Most fats in food are triglycerides. Triglycerides  in your blood are stored as fat in your body. High levels of triglycerides in your blood may put you at a greater risk for heart disease and stroke.  Normal triglyceride levels are less than 150 mg/dL. Borderline high levels are 150-199 mg/dl. High levels are 200 - 499 mg/dL, and very high triglyceride levels are greater than 500 mg/dL. The decision  to treat high triglycerides is generally based on the level. For people with borderline or high triglyceride levels, treatment includes weight loss and exercise. Drugs are recommended for people with very high triglyceride levels. Many people who need treatment for high triglyceride levels have metabolic syndrome. This syndrome is a collection of disorders that often include: insulin resistance, high blood pressure, blood clotting problems, high cholesterol and triglycerides. TESTING PROCEDURE FOR TRIGLYCERIDES  You should not eat 4 hours before getting your triglycerides measured. The normal range of triglycerides is between 10 and 250 milligrams per deciliter (mg/dl). Some people may have extreme levels (1000 or above), but your triglyceride level may be too high if it is above 150 mg/dl, depending on what other risk factors you have for heart disease.  People with high blood triglycerides may also have high blood cholesterol levels. If you have high blood cholesterol as well as high blood triglycerides, your risk for heart disease is probably greater than if you only had high triglycerides. High blood cholesterol is one of the main risk factors for heart disease. CHANGING YOUR DIET  Your weight can affect your blood triglyceride level. If you are more than 20% above your ideal body weight, you may be able to lower your blood triglycerides by losing weight. Eating less and exercising regularly is the best way to combat this. Fat provides more calories than any other food. The best way to lose weight is to eat less fat. Only 30% of your total calories should come from fat. Less than 7% of your diet should come from saturated fat. A diet low in fat and saturated fat is the same as a diet to decrease blood cholesterol. By eating a diet lower in fat, you may lose weight, lower your blood cholesterol, and lower your blood triglyceride level.  Eating a diet low in fat, especially saturated fat, may also help  you lower your blood triglyceride level. Ask your dietitian to help you figure how much fat you can eat based on the number of calories your caregiver has prescribed for you.  Exercise, in addition to helping with weight loss may also help lower triglyceride levels.   Alcohol can increase blood triglycerides. You may need to stop drinking alcoholic beverages.  Too much carbohydrate in your diet may also increase your blood triglycerides. Some complex carbohydrates are necessary in your diet. These may include bread, rice, potatoes, other starchy vegetables and cereals.  Reduce "simple" carbohydrates. These may include pure sugars, candy, honey, and jelly without losing other nutrients. If you have the kind of high blood triglycerides that is affected by the amount of carbohydrates in your diet, you will need to eat less sugar and less high-sugar foods. Your caregiver can help you with this.  Adding 2-4 grams of fish oil (EPA+ DHA) may also help lower triglycerides. Speak with your caregiver before adding any supplements to your regimen. Following the Diet  Maintain your ideal weight. Your caregivers can help you with a diet. Generally, eating less food and getting more exercise will help you lose weight. Joining a weight control group may also help. Ask  your caregivers for a good weight control group in your area.  Eat low-fat foods instead of high-fat foods. This can help you lose weight too.  These foods are lower in fat. Eat MORE of these:   Dried beans, peas, and lentils.  Egg whites.  Low-fat cottage cheese.  Fish.  Lean cuts of meat, such as round, sirloin, rump, and flank (cut extra fat off meat you fix).  Whole grain breads, cereals and pasta.  Skim and nonfat dry milk.  Low-fat yogurt.  Poultry without the skin.  Cheese made with skim or part-skim milk, such as mozzarella, parmesan, farmers', ricotta, or pot cheese. These are higher fat foods. Eat LESS of these:   Whole  milk and foods made from whole milk, such as American, blue, cheddar, monterey jack, and swiss cheese  High-fat meats, such as luncheon meats, sausages, knockwurst, bratwurst, hot dogs, ribs, corned beef, ground pork, and regular ground beef.  Fried foods. Limit saturated fats in your diet. Substituting unsaturated fat for saturated fat may decrease your blood triglyceride level. You will need to read package labels to know which products contain saturated fats.  These foods are high in saturated fat. Eat LESS of these:   Fried pork skins.  Whole milk.  Skin and fat from poultry.  Palm oil.  Butter.  Shortening.  Cream cheese.  Berniece Salines.  Margarines and baked goods made from listed oils.  Vegetable shortenings.  Chitterlings.  Fat from meats.  Coconut oil.  Palm kernel oil.  Lard.  Cream.  Sour cream.  Fatback.  Coffee whiteners and non-dairy creamers made with these oils.  Cheese made from whole milk. Use unsaturated fats (both polyunsaturated and monounsaturated) moderately. Remember, even though unsaturated fats are better than saturated fats; you still want a diet low in total fat.  These foods are high in unsaturated fat:   Canola oil.  Sunflower oil.  Mayonnaise.  Almonds.  Peanuts.  Pine nuts.  Margarines made with these oils.  Safflower oil.  Olive oil.  Avocados.  Cashews.  Peanut butter.  Sunflower seeds.  Soybean oil.  Peanut oil.  Olives.  Pecans.  Walnuts.  Pumpkin seeds. Avoid sugar and other high-sugar foods. This will decrease carbohydrates without decreasing other nutrients. Sugar in your food goes rapidly to your blood. When there is excess sugar in your blood, your liver may use it to make more triglycerides. Sugar also contains calories without other important nutrients.  Eat LESS of these:   Sugar, brown sugar, powdered sugar, jam, jelly, preserves, honey, syrup, molasses, pies, candy, cakes, cookies,  frosting, pastries, colas, soft drinks, punches, fruit drinks, and regular gelatin.  Avoid alcohol. Alcohol, even more than sugar, may increase blood triglycerides. In addition, alcohol is high in calories and low in nutrients. Ask for sparkling water, or a diet soft drink instead of an alcoholic beverage. Suggestions for planning and preparing meals   Bake, broil, grill or roast meats instead of frying.  Remove fat from meats and skin from poultry before cooking.  Add spices, herbs, lemon juice or vinegar to vegetables instead of salt, rich sauces or gravies.  Use a non-stick skillet without fat or use no-stick sprays.  Cool and refrigerate stews and broth. Then remove the hardened fat floating on the surface before serving.  Refrigerate meat drippings and skim off fat to make low-fat gravies.  Serve more fish.  Use less butter, margarine and other high-fat spreads on bread or vegetables.  Use skim or reconstituted non-fat  dry milk for cooking.  Cook with low-fat cheeses.  Substitute low-fat yogurt or cottage cheese for all or part of the sour cream in recipes for sauces, dips or congealed salads.  Use half yogurt/half mayonnaise in salad recipes.  Substitute evaporated skim milk for cream. Evaporated skim milk or reconstituted non-fat dry milk can be whipped and substituted for whipped cream in certain recipes.  Choose fresh fruits for dessert instead of high-fat foods such as pies or cakes. Fruits are naturally low in fat. When Dining Out   Order low-fat appetizers such as fruit or vegetable juice, pasta with vegetables or tomato sauce.  Select clear, rather than cream soups.  Ask that dressings and gravies be served on the side. Then use less of them.  Order foods that are baked, broiled, poached, steamed, stir-fried, or roasted.  Ask for margarine instead of butter, and use only a small amount.  Drink sparkling water, unsweetened tea or coffee, or diet soft drinks  instead of alcohol or other sweet beverages. QUESTIONS AND ANSWERS ABOUT OTHER FATS IN THE BLOOD: SATURATED FAT, TRANS FAT, AND CHOLESTEROL What is trans fat? Trans fat is a type of fat that is formed when vegetable oil is hardened through a process called hydrogenation. This process helps makes foods more solid, gives them shape, and prolongs their shelf life. Trans fats are also called hydrogenated or partially hydrogenated oils.  What do saturated fat, trans fat, and cholesterol in foods have to do with heart disease? Saturated fat, trans fat, and cholesterol in the diet all raise the level of LDL "bad" cholesterol in the blood. The higher the LDL cholesterol, the greater the risk for coronary heart disease (CHD). Saturated fat and trans fat raise LDL similarly.  What foods contain saturated fat, trans fat, and cholesterol? High amounts of saturated fat are found in animal products, such as fatty cuts of meat, chicken skin, and full-fat dairy products like butter, whole milk, cream, and cheese, and in tropical vegetable oils such as palm, palm kernel, and coconut oil. Trans fat is found in some of the same foods as saturated fat, such as vegetable shortening, some margarines (especially hard or stick margarine), crackers, cookies, baked goods, fried foods, salad dressings, and other processed foods made with partially hydrogenated vegetable oils. Small amounts of trans fat also occur naturally in some animal products, such as milk products, beef, and lamb. Foods high in cholesterol include liver, other organ meats, egg yolks, shrimp, and full-fat dairy products. How can I use the new food label to make heart-healthy food choices? Check the Nutrition Facts panel of the food label. Choose foods lower in saturated fat, trans fat, and cholesterol. For saturated fat and cholesterol, you can also use the Percent Daily Value (%DV): 5% DV or less is low, and 20% DV or more is high. (There is no %DV for trans  fat.) Use the Nutrition Facts panel to choose foods low in saturated fat and cholesterol, and if the trans fat is not listed, read the ingredients and limit products that list shortening or hydrogenated or partially hydrogenated vegetable oil, which tend to be high in trans fat. POINTS TO REMEMBER:   Discuss your risk for heart disease with your caregivers, and take steps to reduce risk factors.  Change your diet. Choose foods that are low in saturated fat, trans fat, and cholesterol.  Add exercise to your daily routine if it is not already being done. Participate in physical activity of moderate intensity, like brisk  walking, for at least 30 minutes on most, and preferably all days of the week. No time? Break the 30 minutes into three, 10-minute segments during the day.  Stop smoking. If you do smoke, contact your caregiver to discuss ways in which they can help you quit.  Do not use street drugs.  Maintain a normal weight.  Maintain a healthy blood pressure.  Keep up with your blood work for checking the fats in your blood as directed by your caregiver. Document Released: 05/15/2004 Document Revised: 01/27/2012 Document Reviewed: 12/11/2008 [facility] Patient Information 2014 [place].

## 2013-10-10 NOTE — Progress Notes (Signed)
Pre visit review using our clinic review tool, if applicable. No additional management support is needed unless otherwise documented below in the visit note. 

## 2013-10-10 NOTE — Progress Notes (Signed)
Subjective:    Patient ID: [name], female    DOB: 1940-11-21, 73 y.o.   MRN: [id number]  HPI 73 y.o. White female presents today for medication refill and recheck of hypertension, hyperlipidemia, glaucoma, and depression. She denies any complaints and states that she is "doing great" on her current medications. She was seen in January and had a BMP and CBC drawn, but has not had any other labs or a physical done this year. Denies fever, fatigue, malaise, weight change.     Review of Systems  Constitutional: Negative.   HENT: Negative.   Eyes: Negative.   Respiratory: Negative.   Cardiovascular: Negative.   Gastrointestinal: Negative.   Endocrine: Negative.   Genitourinary: Negative.   Musculoskeletal: Negative.   Skin: Negative.   Allergic/Immunologic: Negative.   Neurological: Negative.   Hematological: Negative.   Psychiatric/Behavioral: Negative.        Past Medical History  Diagnosis Date  . HYPERLIPIDEMIA 09/22/2007  . DEPRESSION 03/11/2007  . 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 CERUMEN IMPACTION, BILATERAL 12/13/2007  . HYPERTENSION 03/11/2007  . DIVERTICULOSIS, COLON 03/11/2007  . Rosacea 03/11/2007  . CERVICAL RADICULOPATHY 11/05/2009  . UNS ADVRS EFF UNS RX MEDICINAL&BIOLOGICAL SBSTNC 11/05/2009  . Arthritis   . Chronic kidney disease     kidney stones  . Kidney stone   . Adenomatous colon polyp 2006  . Tachycardia   . Anxiety   . GERD (gastroesophageal reflux disease) 10/14/2012  . Esophagitis, reflux 10/14/2012  . Benign esophageal stricture 10/14/2012  . Osteopenia   . Rectocele     History   Social History  . Marital Status: Widowed    Spouse Name: N/A    Number of Children: N/A  . Years of Education: N/A   Occupational History  . retired    Social History Main Topics  . Smoking status: Never Smoker   . Smokeless tobacco: Never Used  . Alcohol Use: No  . Drug Use: No  . Sexual Activity: No   Other Topics Concern  .  Not on file   Social History Narrative  . No narrative on file    Past Surgical History  Procedure Laterality Date  . Kidney surgery      age 4  . Cervical fusion      C4-6  . Tonsillectomy    . Colonoscopy      multiple  . Anterior cervical decomp/discectomy fusion  08/14/2011    Procedure: ANTERIOR CERVICAL DECOMPRESSION/DISCECTOMY FUSION 1 LEVEL/HARDWARE REMOVAL;  Surgeon: [name], MD;  Location: [facility];  Service: Neurosurgery;  Laterality: N/A;  Cervical Six-Seven anterior cervical decompression with fusion interbody prothesis plating and bonegraft with removal of hardware of Cervical Four to Cervical Six   . Esophagogastroduodenoscopy  10/14/2012    Dr. [name]    Family History  Problem Relation Age of Onset  . Mental illness Mother     comitted suicide  . Lung cancer Father     smoker  . Hypertension Father   . Heart disease Father   . Cerebral palsy Sister   . Diabetes Maternal Grandmother   . Colon cancer Neg Hx   . Stomach cancer Neg Hx     Allergies  Allergen Reactions  . Codeine Nausea Only    Current Outpatient Prescriptions on File Prior to Visit  Medication Sig Dispense Refill  . BIOTIN PO Take by mouth.      . Calcium Carbonate-Vitamin D (CALCIUM + D  PO) Take 1 tablet by mouth daily.        [name] CARTIA XT 240 MG 24 hr capsule TAKE 1 CAPSULE BY MOUTH EVERY DAY  30 capsule  0  . escitalopram (LEXAPRO) 20 MG tablet TAKE 1 TABLET BY MOUTH DAILY  90 tablet  6  . hydrochlorothiazide (HYDRODIURIL) 25 MG tablet TAKE 1 TABLET BY MOUTH EVERY DAY  90 tablet  0  . irbesartan-hydrochlorothiazide (AVALIDE) 300-12.5 MG per tablet TAKE 1 TABLET BY MOUTH EVERY DAY  90 tablet  0  . loperamide (IMODIUM) 2 MG capsule Take 1 capsule (2 mg total) by mouth 2 (two) times daily as needed for diarrhea or loose stools.  30 capsule  0  . niacin 500 MG tablet Take 500 mg by mouth daily with breakfast.        . pantoprazole (PROTONIX) 40 MG tablet TAKE 1 TABLET BY  MOUTH EVERY DAY BEFORE BREAKFAST  30 tablet  5  . Probiotic Product (Prospect) CAPS Take 1 capsule by mouth daily.  30 capsule  0  . timolol (TIMOPTIC) 0.5 % ophthalmic solution        No current facility-administered medications on file prior to visit.    BP 124/82  Pulse 87  Ht 4' 11.75" (1.518 m)  Wt 130 lb (58.968 kg)  BMI 25.59 kg/m2  LMP 01/01/1993chart Objective:   Physical Exam  Constitutional: She is oriented to person, place, and time. She appears well-developed and well-nourished. She is active.  HENT:  Head: Normocephalic.  Cardiovascular: Normal rate, regular rhythm, S1 normal, S2 normal and normal heart sounds.   Pulmonary/Chest: Effort normal and breath sounds normal.  Abdominal: Soft. Normal appearance and bowel sounds are normal.  Neurological: She is alert and oriented to person, place, and time.  Skin: Skin is warm, dry and intact.  Psychiatric: She has a normal mood and affect. Her speech is normal and behavior is normal.          Assessment & Plan:  [name] was seen today for medication refill.  Diagnoses and associated orders for this visit:  Other and unspecified hyperlipidemia - Hepatic Function Panel - Lipid Panel  Unspecified essential hypertension - Hepatic Function Panel  Depression  Glaucoma - Hepatic Function Panel    recheck for complete physical exam in 3 months and sooner if needed.

## 2013-10-12 ENCOUNTER — Other Ambulatory Visit: Payer: Self-pay | Admitting: Internal Medicine

## 2013-10-26 ENCOUNTER — Other Ambulatory Visit: Payer: Self-pay | Admitting: Internal Medicine

## 2013-10-28 ENCOUNTER — Encounter: Payer: Self-pay | Admitting: Gynecology

## 2013-11-02 DIAGNOSIS — M25562 Pain in left knee: Secondary | ICD-10-CM | POA: Insufficient documentation

## 2013-11-15 ENCOUNTER — Other Ambulatory Visit: Payer: Self-pay | Admitting: Internal Medicine

## 2013-11-23 ENCOUNTER — Ambulatory Visit (INDEPENDENT_AMBULATORY_CARE_PROVIDER_SITE_OTHER): Payer: Medicare Other | Admitting: Internal Medicine

## 2013-11-23 ENCOUNTER — Encounter: Payer: Self-pay | Admitting: Internal Medicine

## 2013-11-23 VITALS — BP 141/79 | HR 59 | Ht 61.0 in | Wt 129.8 lb

## 2013-11-23 DIAGNOSIS — I498 Other specified cardiac arrhythmias: Secondary | ICD-10-CM

## 2013-11-23 DIAGNOSIS — I471 Supraventricular tachycardia: Secondary | ICD-10-CM

## 2013-11-23 DIAGNOSIS — I10 Essential (primary) hypertension: Secondary | ICD-10-CM

## 2013-11-23 NOTE — Progress Notes (Signed)
[name] is a 73 y.o. female who presents today for routine cardiology followup.  Since last being seen in our clinic, the patient reports doing very well.  She denies any significant symptoms of SVT.  Today, she denies symptoms of palpitations, chest pain, shortness of breath,  lower extremity edema, dizziness, presyncope, or syncope.  The patient is otherwise without complaint today.   Past Medical History  Diagnosis Date  . HYPERLIPIDEMIA 09/22/2007  . DEPRESSION 03/11/2007  . 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 CERUMEN IMPACTION, BILATERAL 12/13/2007  . HYPERTENSION 03/11/2007  . DIVERTICULOSIS, COLON 03/11/2007  . Rosacea 03/11/2007  . CERVICAL RADICULOPATHY 11/05/2009  . UNS ADVRS EFF UNS RX MEDICINAL&BIOLOGICAL SBSTNC 11/05/2009  . Arthritis   . Chronic kidney disease     kidney stones  . Kidney stone   . Adenomatous colon polyp 2006  . Tachycardia   . Anxiety   . GERD (gastroesophageal reflux disease) 10/14/2012  . Esophagitis, reflux 10/14/2012  . Benign esophageal stricture 10/14/2012  . Osteopenia   . Rectocele    Past Surgical History  Procedure Laterality Date  . Kidney surgery      age 63  . Cervical fusion      C4-6  . Tonsillectomy    . Colonoscopy      multiple  . Anterior cervical decomp/discectomy fusion  08/14/2011    Procedure: ANTERIOR CERVICAL DECOMPRESSION/DISCECTOMY FUSION 1 LEVEL/HARDWARE REMOVAL;  Surgeon: [name], MD;  Location: [facility];  Service: Neurosurgery;  Laterality: N/A;  Cervical Six-Seven anterior cervical decompression with fusion interbody prothesis plating and bonegraft with removal of hardware of Cervical Four to Cervical Six   . Esophagogastroduodenoscopy  10/14/2012    Dr. [name]    Current Outpatient Prescriptions  Medication Sig Dispense Refill  . BIOTIN PO Take 1 capsule by mouth daily.       . Calcium Carbonate-Vitamin D (CALCIUM + D PO) Take 1 tablet by mouth daily.        [name] CARTIA XT 240 MG 24  hr capsule TAKE ONE CAPSULE BY MOUTH EVERY DAY  90 capsule  0  . escitalopram (LEXAPRO) 20 MG tablet TAKE 1 TABLET BY MOUTH DAILY  90 tablet  6  . hydrochlorothiazide (HYDRODIURIL) 25 MG tablet TAKE 1 TABLET BY MOUTH EVERY DAY  90 tablet  0  . irbesartan-hydrochlorothiazide (AVALIDE) 300-12.5 MG per tablet TAKE 1 TABLET BY MOUTH EVERY DAY  90 tablet  0  . loperamide (IMODIUM) 2 MG capsule Take 1 capsule (2 mg total) by mouth 2 (two) times daily as needed for diarrhea or loose stools.  30 capsule  0  . Multiple Vitamins-Minerals (ICAPS PO) Take 1 capsule by mouth daily.      . niacin 500 MG tablet Take 500 mg by mouth daily with breakfast.        . pantoprazole (PROTONIX) 40 MG tablet TAKE 1 TABLET BY MOUTH EVERY DAY BEFORE BREAKFAST  30 tablet  5  . timolol (TIMOPTIC) 0.5 % ophthalmic solution Place 1 drop into both eyes every morning.        No current facility-administered medications for this visit.    Physical Exam: Filed Vitals:   11/23/13 1053  BP: 141/79  Pulse: 59  Height: 5\' 1"  (1.549 m)  Weight: 129 lb 12.8 oz (58.877 kg)    GEN- The patient is well appearing, alert and oriented x 3 today.   Head- normocephalic, atraumatic Eyes-  Sclera clear, conjunctiva pink Ears-  hearing intact Oropharynx- clear Lungs- Clear to ausculation bilaterally, normal work of breathing Heart- Regular rate and rhythm, no murmurs, rubs or gallops, PMI not laterally displaced GI- soft, NT, ND, + BS Extremities- no clubbing, cyanosis, or edema  ekg today reveals sinus rhythm, PR 176, incomplete RBBB, LAHB (QRS 90 msec)  Assessment and Plan:  1. SVT Well controlled No changes  2. HTN Repeat by BP by me is 138/68 No changes Labs reviewed  No changes today Return in 1 year

## 2013-11-23 NOTE — Patient Instructions (Signed)
Your physician recommends that you continue on your current medications as directed. Please refer to the Current Medication list given to you today.  Your physician wants you to follow-up in: 1 year with Dr. Allred.  You will receive a reminder letter in the mail two months in advance. If you don't receive a letter, please call our office to schedule the follow-up appointment.  

## 2013-12-06 ENCOUNTER — Other Ambulatory Visit: Payer: Self-pay | Admitting: Internal Medicine

## 2013-12-08 ENCOUNTER — Other Ambulatory Visit: Payer: Self-pay | Admitting: Internal Medicine

## 2013-12-23 ENCOUNTER — Ambulatory Visit (INDEPENDENT_AMBULATORY_CARE_PROVIDER_SITE_OTHER): Payer: Medicare Other | Admitting: Internal Medicine

## 2013-12-23 ENCOUNTER — Encounter: Payer: Self-pay | Admitting: Internal Medicine

## 2013-12-23 ENCOUNTER — Telehealth: Payer: Self-pay | Admitting: Internal Medicine

## 2013-12-23 ENCOUNTER — Ambulatory Visit: Payer: Medicare Other | Admitting: Family Medicine

## 2013-12-23 VITALS — BP 144/70 | HR 83 | Temp 98.8°F | Ht 61.0 in | Wt 127.0 lb

## 2013-12-23 DIAGNOSIS — J329 Chronic sinusitis, unspecified: Secondary | ICD-10-CM

## 2013-12-23 DIAGNOSIS — J069 Acute upper respiratory infection, unspecified: Secondary | ICD-10-CM

## 2013-12-23 DIAGNOSIS — J40 Bronchitis, not specified as acute or chronic: Secondary | ICD-10-CM

## 2013-12-23 MED ORDER — ALBUTEROL SULFATE (2.5 MG/3ML) 0.083% IN NEBU
2.5000 mg | INHALATION_SOLUTION | RESPIRATORY_TRACT | Status: AC
  Administered 2013-12-23: 2.5 mg via RESPIRATORY_TRACT

## 2013-12-23 MED ORDER — BENZONATATE 100 MG PO CAPS: 100.0000 mg | ORAL_CAPSULE | Freq: Two times a day (BID) | ORAL | Status: DC | PRN

## 2013-12-23 MED ORDER — DOXYCYCLINE HYCLATE 100 MG PO CAPS: 100.0000 mg | ORAL_CAPSULE | Freq: Two times a day (BID) | ORAL | Status: DC

## 2013-12-23 NOTE — Telephone Encounter (Signed)
Patient Information:  Caller Name: Kameisha  Phone: [phone]  Patient: [name]  Gender: Female  DOB: [birth year]  Age: 73 Years  PCP: Benay Pillow (Adults only)  Office Follow Up:  Does the office need to follow up with this patient?: No  Instructions For The Office: N/A   Symptoms  Reason For Call & Symptoms: Pt calling regarding cough and congestion since 12/19/13. No energy. Has yellow congestion from nose but chest congestion is not breaking up.  Reviewed Health History In EMR: Yes  Reviewed Medications In EMR: Yes  Reviewed Allergies In EMR: Yes  Reviewed Surgeries / Procedures: Yes  Date of Onset of Symptoms: 12/23/2013  Treatments Tried: Alegra, Tylenol per package directions  Treatments Tried Worked: No  Guideline(s) Used:  Colds  Disposition Per Guideline:   See Today in Office  Reason For Disposition Reached:   Earache  Advice Given:  N/A  Patient Will Follow Care Advice:  YES  Appointment Scheduled:  12/23/2013 13:45:00 Appointment Scheduled Provider:  Shanon Ace (Family Practice)

## 2013-12-23 NOTE — Progress Notes (Signed)
Chief Complaint  Patient presents with  . Cough    Started on Monday.  . Shortness of Breath  . Fatigue  . Chills    HPI: Patient comes in today for SDA for  new problem evaluation. CAN note has 4 days of cough  and nasal congestion and told to come to be seen as acute work in  No earache but they itch  . Head feels tight and full." likje in  avise " Chest is tight and stomach hurts from coughing from tightness. Sx x 4 days not getting better  Some allergy in fall.  Not now no hx asthma  No hx of inhaler use.  ? Fever onset.  Wet in am. No rigors  otc meds chest hurts in middle sinus pain pressure  No ets or tobacco.  ROS: See pertinent positives and negatives per HPI.  Past Medical History  Diagnosis Date  . HYPERLIPIDEMIA 09/22/2007  . DEPRESSION 03/11/2007  . 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 CERUMEN IMPACTION, BILATERAL 12/13/2007  . HYPERTENSION 03/11/2007  . DIVERTICULOSIS, COLON 03/11/2007  . Rosacea 03/11/2007  . CERVICAL RADICULOPATHY 11/05/2009  . UNS ADVRS EFF UNS RX MEDICINAL&BIOLOGICAL SBSTNC 11/05/2009  . Arthritis   . Chronic kidney disease     kidney stones  . Kidney stone   . Adenomatous colon polyp 2006  . Tachycardia   . Anxiety   . GERD (gastroesophageal reflux disease) 10/14/2012  . Esophagitis, reflux 10/14/2012  . Benign esophageal stricture 10/14/2012  . Osteopenia   . Rectocele     Family History  Problem Relation Age of Onset  . Mental illness Mother     comitted suicide  . Lung cancer Father     smoker  . Hypertension Father   . Heart disease Father   . Cerebral palsy Sister   . Diabetes Maternal Grandmother   . Colon cancer Neg Hx   . Stomach cancer Neg Hx     History   Social History  . Marital Status: Widowed    Spouse Name: N/A    Number of Children: N/A  . Years of Education: N/A   Occupational History  . retired    Social History Main Topics  . Smoking status: Never Smoker   . Smokeless tobacco:  Never Used  . Alcohol Use: No  . Drug Use: No  . Sexual Activity: No   Other Topics Concern  . None   Social History Narrative  . None    Outpatient Encounter Prescriptions as of 12/23/2013  Medication Sig  . ALPRAZolam (XANAX) 0.25 MG tablet TAKE 1 TABLET BY MOUTH DAILY AS NEEDED  . BIOTIN PO Take 1 capsule by mouth daily.   . Calcium Carbonate-Vitamin D (CALCIUM + D PO) Take 1 tablet by mouth daily.    [name] CARTIA XT 240 MG 24 hr capsule TAKE ONE CAPSULE BY MOUTH EVERY DAY  . escitalopram (LEXAPRO) 20 MG tablet TAKE 1 TABLET BY MOUTH DAILY  . hydrochlorothiazide (HYDRODIURIL) 25 MG tablet TAKE 1 TABLET BY MOUTH EVERY DAY  . irbesartan-hydrochlorothiazide (AVALIDE) 300-12.5 MG per tablet TAKE 1 TABLET BY MOUTH EVERY DAY  . loperamide (IMODIUM) 2 MG capsule Take 1 capsule (2 mg total) by mouth 2 (two) times daily as needed for diarrhea or loose stools.  . Multiple Vitamins-Minerals (ICAPS PO) Take 1 capsule by mouth daily.  . niacin 500 MG tablet Take 500 mg by mouth daily with breakfast.    . pantoprazole (PROTONIX) 40 MG  tablet TAKE 1 TABLET BY MOUTH EVERY DAY BEFORE BREAKFAST  . timolol (TIMOPTIC) 0.5 % ophthalmic solution Place 1 drop into both eyes every morning.   . benzonatate (TESSALON) 100 MG capsule Take 1 capsule (100 mg total) by mouth 2 (two) times daily as needed for cough.  . doxycycline (VIBRAMYCIN) 100 MG capsule Take 1 capsule (100 mg total) by mouth 2 (two) times daily.  . [EXPIRED] albuterol (PROVENTIL) (2.5 MG/3ML) 0.083% nebulizer solution 2.5 mg     EXAM:  BP 144/70  Pulse 83  Temp(Src) 98.8 F (37.1 C) (Oral)  Ht 5\' 1"  (1.549 m)  Wt 127 lb (57.607 kg)  BMI 24.01 kg/m2  SpO2 98%  LMP 08/12/1991  Body mass index is 24.01 kg/(m^2). WDWN in NAD  quiet respirations; mildly congested  somewhat hoarse. Non toxic . Dry hacking cough  Ilooser after neb HEENT: Normocephalic ;atraumatic , Eyes;  PERRL, EOMs  Full, lids and conjunctiva clear,,Ears: no deformities,  canals nl, TM landmarks normal, Nose: no deformity or discharge but  verycongested;face minimally tender Mouth : OP clear without lesion or edema . Neck: Supple without adenopathy or masses or bruits Chest:   bs = end exp weheeze with cough  No  rales or rhonchi  Looser cough after neb and dec incidence of cough  CV:  S1-S2 no gallops or murmurs peripheral perfusion is normal Skin :nl perfusion and no acute rashes  ASSESSMENT AND PLAN:  Discussed the following assessment and plan:  Wheezy bronchitis - Plan: albuterol (PROVENTIL) (2.5 MG/3ML) 0.083% nebulizer solution 2.5 mg  Acute upper respiratory infection of multiple sites  Sinusitis illnesss looks more viral but has sinus sx   Risk benefit of medication discussed. A can try cough med local measures and add antibiotic if needed ( this is Friday ) i think her cough and chest is better after bronchodilator but she says it isnt so no b agonist given ( hx of svt) -Patient advised to return or notify health care team  if symptoms worsen ,persist or new concerns arise.  Patient Instructions  This is probably a viral respiratory infection affecting sinuses and chest /lungs .usually run its course in 7- 10 days but can last longer.   Fluids  Cough meds dont always help.but can try one that isnt codeine.  If face pressure not relieved in the next 3-5 days can add antibiotic . Plain mucinex may help loosen the  Cough . Your chest sounds less tight  After nebulizer    Mariann Laster K. Zarya Lasseigne M.D.  Pre visit review using our clinic review tool, if applicable. No additional management support is needed unless otherwise documented below in the visit note.

## 2013-12-23 NOTE — Patient Instructions (Addendum)
This is probably a viral respiratory infection affecting sinuses and chest /lungs .usually run its course in 7- 10 days but can last longer.   Fluids  Cough meds dont always help.but can try one that isnt codeine.  If face pressure not relieved in the next 3-5 days can add antibiotic . Plain mucinex may help loosen the  Cough . Your chest sounds less tight  After nebulizer

## 2014-01-04 ENCOUNTER — Other Ambulatory Visit: Payer: Self-pay | Admitting: Internal Medicine

## 2014-01-26 ENCOUNTER — Other Ambulatory Visit: Payer: Self-pay | Admitting: Internal Medicine

## 2014-02-20 ENCOUNTER — Other Ambulatory Visit: Payer: Self-pay | Admitting: Internal Medicine

## 2014-02-28 ENCOUNTER — Telehealth: Payer: Self-pay | Admitting: Internal Medicine

## 2014-02-28 DIAGNOSIS — H9190 Unspecified hearing loss, unspecified ear: Secondary | ICD-10-CM

## 2014-02-28 NOTE — Telephone Encounter (Signed)
Pt states she is having issues hearing and needs referral for hearing aid center, dr pahel.  Pt  states medicaid will pay w/ referral.  Pt has appt July 31 at 12:45.  pahel audiologist [facility] [place], [place]

## 2014-03-01 NOTE — Telephone Encounter (Signed)
Referral placed.

## 2014-03-15 ENCOUNTER — Other Ambulatory Visit: Payer: Self-pay | Admitting: Internal Medicine

## 2014-03-20 ENCOUNTER — Telehealth: Payer: Self-pay | Admitting: Internal Medicine

## 2014-03-20 MED ORDER — IRBESARTAN-HYDROCHLOROTHIAZIDE 300-12.5 MG PO TABS: 1.0000 | ORAL_TABLET | Freq: Every day | ORAL | Status: DC

## 2014-03-20 NOTE — Telephone Encounter (Signed)
Medication refilled

## 2014-03-20 NOTE — Telephone Encounter (Signed)
Pt has made an appt w/hunter in dec 2015. Pt needs refill on avalide 300-12.5 mg sent to [facility]

## 2014-04-03 ENCOUNTER — Telehealth: Payer: Self-pay | Admitting: Internal Medicine

## 2014-04-03 MED ORDER — CIPROFLOXACIN HCL 500 MG PO TABS: 500.0000 mg | ORAL_TABLET | Freq: Two times a day (BID) | ORAL | Status: DC

## 2014-04-03 MED ORDER — METRONIDAZOLE 500 MG PO TABS: 500.0000 mg | ORAL_TABLET | Freq: Two times a day (BID) | ORAL | Status: DC

## 2014-04-03 NOTE — Telephone Encounter (Signed)
Patient notified.  She will call later in the week with an update on her symptoms.

## 2014-04-03 NOTE — Telephone Encounter (Signed)
Patient reports that she has LLQ pain that started yesterday, she feels this is her diverticulitis.   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  She states that she had 2 large BMs this am she denies diarrhea, rectal bleeding, or fever.  Her main complaint is that she is not able to eat and LLQ pain.  She is offered an appt for tomorrow, but she has a church trip she has "paid $50 to go to" and is asking to be treated over the phone.  She feels that she will be up to the trip tomorrow.  Dr. Carlean Purl please advise

## 2014-04-03 NOTE — Telephone Encounter (Signed)
cipro 500 mg bid Metronidazole 500 mg bid  Both x 10 days  Ask her to call us later in week with an update

## 2014-04-05 ENCOUNTER — Encounter: Payer: Self-pay | Admitting: Internal Medicine

## 2014-04-13 ENCOUNTER — Other Ambulatory Visit: Payer: Self-pay | Admitting: Internal Medicine

## 2014-04-14 ENCOUNTER — Other Ambulatory Visit: Payer: Self-pay

## 2014-04-14 ENCOUNTER — Telehealth: Payer: Self-pay | Admitting: Internal Medicine

## 2014-04-14 MED ORDER — ESCITALOPRAM OXALATE 20 MG PO TABS: ORAL_TABLET | ORAL | Status: DC

## 2014-04-14 NOTE — Telephone Encounter (Signed)
Pt request refill of the following: escitalopram (LEXAPRO) 20 MG tablet    Phamacy:  [facility]

## 2014-04-18 ENCOUNTER — Other Ambulatory Visit: Payer: Self-pay | Admitting: Orthopedic Surgery

## 2014-04-18 DIAGNOSIS — R609 Edema, unspecified: Secondary | ICD-10-CM

## 2014-04-18 DIAGNOSIS — M25562 Pain in left knee: Secondary | ICD-10-CM

## 2014-04-19 ENCOUNTER — Ambulatory Visit
Admission: RE | Admit: 2014-04-19 | Discharge: 2014-04-19 | Disposition: A | Discharge status: Home / Self Care | Payer: Medicare Other | Source: Ambulatory Visit | Attending: Orthopedic Surgery | Admitting: Orthopedic Surgery

## 2014-04-19 ENCOUNTER — Other Ambulatory Visit: Payer: Self-pay | Admitting: Internal Medicine

## 2014-04-19 DIAGNOSIS — M25562 Pain in left knee: Secondary | ICD-10-CM

## 2014-04-19 DIAGNOSIS — R609 Edema, unspecified: Secondary | ICD-10-CM

## 2014-04-24 ENCOUNTER — Other Ambulatory Visit: Payer: Self-pay | Admitting: Internal Medicine

## 2014-05-11 ENCOUNTER — Encounter: Payer: Self-pay | Admitting: Internal Medicine

## 2014-05-11 HISTORY — PX: KNEE ARTHROSCOPY W/ MENISCAL REPAIR: SHX1877

## 2014-05-15 ENCOUNTER — Other Ambulatory Visit: Payer: Self-pay | Admitting: Internal Medicine

## 2014-05-15 NOTE — Telephone Encounter (Signed)
Is this ok to refill?  

## 2014-05-22 ENCOUNTER — Other Ambulatory Visit: Payer: Self-pay | Admitting: Orthopedic Surgery

## 2014-05-22 DIAGNOSIS — M79662 Pain in left lower leg: Secondary | ICD-10-CM

## 2014-05-23 ENCOUNTER — Ambulatory Visit
Admission: RE | Admit: 2014-05-23 | Discharge: 2014-05-23 | Disposition: A | Discharge status: Home / Self Care | Payer: Medicare Other | Source: Ambulatory Visit | Attending: Orthopedic Surgery | Admitting: Orthopedic Surgery

## 2014-05-23 DIAGNOSIS — M79662 Pain in left lower leg: Secondary | ICD-10-CM

## 2014-05-29 DIAGNOSIS — H401131 Primary open-angle glaucoma, bilateral, mild stage: Secondary | ICD-10-CM | POA: Insufficient documentation

## 2014-06-12 ENCOUNTER — Encounter: Payer: Self-pay | Admitting: Internal Medicine

## 2014-06-22 ENCOUNTER — Other Ambulatory Visit: Payer: Self-pay | Admitting: Internal Medicine

## 2014-06-23 ENCOUNTER — Other Ambulatory Visit: Payer: Self-pay | Admitting: Family Medicine

## 2014-07-14 ENCOUNTER — Ambulatory Visit (INDEPENDENT_AMBULATORY_CARE_PROVIDER_SITE_OTHER): Payer: Medicare Other | Admitting: Family Medicine

## 2014-07-14 ENCOUNTER — Encounter: Payer: Self-pay | Admitting: Family Medicine

## 2014-07-14 VITALS — BP 120/70 | HR 76 | Temp 97.6°F | Wt 130.0 lb

## 2014-07-14 DIAGNOSIS — I10 Essential (primary) hypertension: Secondary | ICD-10-CM

## 2014-07-14 DIAGNOSIS — Z23 Encounter for immunization: Secondary | ICD-10-CM

## 2014-07-14 DIAGNOSIS — K219 Gastro-esophageal reflux disease without esophagitis: Secondary | ICD-10-CM | POA: Insufficient documentation

## 2014-07-14 DIAGNOSIS — I471 Supraventricular tachycardia: Secondary | ICD-10-CM

## 2014-07-14 DIAGNOSIS — E785 Hyperlipidemia, unspecified: Secondary | ICD-10-CM

## 2014-07-14 MED ORDER — IRBESARTAN 300 MG PO TABS: 300.0000 mg | ORAL_TABLET | Freq: Every day | ORAL | Status: DC

## 2014-07-14 MED ORDER — DILTIAZEM HCL ER COATED BEADS 240 MG PO CP24: 240.0000 mg | ORAL_CAPSULE | Freq: Every day | ORAL | Status: DC

## 2014-07-14 MED ORDER — HYDROCHLOROTHIAZIDE 25 MG PO TABS
25.0000 mg | ORAL_TABLET | Freq: Every day | ORAL | Status: DC
Start: 2014-07-14 — End: 2015-05-26

## 2014-07-14 NOTE — Progress Notes (Signed)
Courtney Reddish, MD Phone: [phone]  Subjective:  Patient presents today to establish care with me as their new primary care provider. Patient was formerly a patient of Dr. Arnoldo Montoya. Chief complaint-noted.   Supraventricular tachycardia-currently controlled on diltiazem Patient sees Dr. Rayann Montoya every year with a visit to see me in between. Patient states she has been doing very well on diltiazem. Her symptoms are minimal as per ROS ROS- rare paliptations (1-2x a month) short lived if she gets upset, no lightheadedness/dizziness/chest pain  Hypertension-good control on atypical regimen.  Appears patient is taking irbesartan- hydrochlorothiazide with 12.5 mg of hydrochlorothiazide and hydrochlorothiazide 25 mg.  Along with diltiazem  BP Readings from Last 3 Encounters:  07/14/14 120/70  12/23/13 144/70  11/23/13 141/79  ROS-Denies any CP, HA, SOB, blurry vision, LE edema, transient weakness, orthopnea, PND.   Hyperlipidemia-good control of LDL, triglycerides poorly controlled at 380 when last checked by Dr. Megan Montoya at our office.   On statin: no Regular exercise: Used to walk 4-5 miles a day. [name]- torn meniscus left knee 05/17/14. Wants to get back to walking possibly 3 miles a day.  ROS- no chest pain or shortness of breath. No myalgias  The following were reviewed and entered/updated in epic: Past Medical History  Diagnosis Date  . HYPERLIPIDEMIA 09/22/2007  . DEPRESSION 03/11/2007  . 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 CERUMEN IMPACTION, BILATERAL 12/13/2007  . HYPERTENSION 03/11/2007  . DIVERTICULOSIS, COLON 03/11/2007  . Rosacea 03/11/2007  . CERVICAL RADICULOPATHY 11/05/2009  . Arthritis   . Chronic kidney disease     kidney stones  . Kidney stone   . Adenomatous colon polyp 2006  . Tachycardia   . Anxiety   . GERD (gastroesophageal reflux disease) 10/14/2012  . Esophagitis, reflux 10/14/2012  . Benign esophageal stricture 10/14/2012  . Osteopenia     . Rectocele   . HEMORRHOIDS, EXTERNAL W/O COMPLICATION 2/99/3716    Qualifier: Diagnosis of  By: Jimmye Norman LPN, Winfield Cunas    Patient Active Problem List   Diagnosis Date Noted  . SVT (supraventricular tachycardia) 08/14/2011    Priority: High  . IBS (irritable bowel syndrome)- diarrhea predominant 01/26/2013    Priority: Medium  . Hyperlipidemia 09/22/2007    Priority: Medium  . Depression 03/11/2007    Priority: Medium  . Essential hypertension 03/11/2007    Priority: Medium  . GERD (gastroesophageal reflux disease) 07/14/2014    Priority: Low  . Personal history of colonic polyps 11/28/2011    Priority: Low  . 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 Cervical disc disorder with radiculopathy of cervical region 11/05/2009    Priority: Low  . Rosacea 03/11/2007    Priority: Low   Past Surgical History  Procedure Laterality Date  . Kidney surgery      age 79  . Cervical fusion      C4-6  . Tonsillectomy    . Colonoscopy      multiple  . Anterior cervical decomp/discectomy fusion  08/14/2011    Procedure: ANTERIOR CERVICAL DECOMPRESSION/DISCECTOMY FUSION 1 LEVEL/HARDWARE REMOVAL;  Surgeon: Courtney Shoals, MD;  Location: [facility];  Service: Neurosurgery;  Laterality: N/A;  Cervical Six-Seven anterior cervical decompression with fusion interbody prothesis plating and bonegraft with removal of hardware of Cervical Four to Cervical Six   . Esophagogastroduodenoscopy  10/14/2012    Dr. Silvano Montoya    Family History  Problem Relation Age of Onset  . Mental illness Mother  comitted suicide  . Lung cancer Father     smoker  . Hypertension Father   . Heart disease Father     age 40, smoker  . Cerebral palsy Sister   . Diabetes Maternal Grandmother   . Colon cancer Neg Hx   . Stomach cancer Neg Hx     Medications- reviewed and updated Current Outpatient Prescriptions  Medication Sig Dispense Refill  . BIOTIN PO Take 1 capsule by  mouth daily.     . Calcium Carbonate-Vitamin D (CALCIUM + D PO) Take 1 tablet by mouth daily.      [name] diltiazem (CARTIA XT) 240 MG 24 hr capsule Take 1 capsule (240 mg total) by mouth daily. 90 capsule 3  . escitalopram (LEXAPRO) 20 MG tablet TAKE 1 TABLET BY MOUTH EVERY DAY 90 tablet 1  . hydrochlorothiazide (HYDRODIURIL) 25 MG tablet Take 1 tablet (25 mg total) by mouth daily. 90 tablet 3  . pantoprazole (PROTONIX) 40 MG tablet TAKE 1 TABLET BY MOUTH EVERY DAY BEFORE BREAKFAST 30 tablet 0  . timolol (TIMOPTIC) 0.5 % ophthalmic solution Place 1 drop into both eyes every morning.     [name] ALPRAZolam (XANAX) 0.25 MG tablet TAKE 1 TABLET BY MOUTH EVERY DAY AS NEEDED (Patient not taking: Reported on 07/14/2014) 30 tablet 0  . irbesartan (AVAPRO) 300 MG tablet Take 1 tablet (300 mg total) by mouth daily. 90 tablet 3  . loperamide (IMODIUM) 2 MG capsule Take 1 capsule (2 mg total) by mouth 2 (two) times daily as needed for diarrhea or loose stools. (Patient not taking: Reported on 07/14/2014) 30 capsule 0   No current facility-administered medications for this visit.    Allergies-reviewed and updated Allergies  Allergen Reactions  . Codeine Nausea Only    History   Social History  . Marital Status: Widowed    Spouse Name: N/A    Number of Children: N/A  . Years of Education: N/A   Occupational History  . retired    Social History Main Topics  . Smoking status: Never Smoker   . Smokeless tobacco: Never Used  . Alcohol Use: No  . Drug Use: No  . Sexual Activity: No   Other Topics Concern  . None   Social History Narrative   Lives with grown son. Widowed. 5 children and 7 grandchildren.       Retired from [employer] after 5 years and later took care of children.       Hobbies: time with grandkids, shopping, go out to eat. Travels once a year with girlfriends.     ROS--See HPI   Objective: BP 120/70 mmHg  Pulse 76  Temp(Src) 97.6 F (36.4 C)  Wt 130 lb (58.968 kg)  LMP  08/12/1991 Gen: NAD, resting comfortably HEENT: Mucous membranes are moist. Oropharynx normal. Good dentition.  CV: RRR no murmurs rubs or gallops Lungs: CTAB no crackles, wheeze, rhonchi Abdomen: soft/nontender/nondistended/normal bowel sounds.  Ext: no edema Skin: warm, dry, thick make up on face with some bumps and patient states plans to follow up with dermatology.  Neuro: grossly normal, moves all extremities, PERRLA   Assessment/Plan:  SVT (supraventricular tachycardia) Well-controlled. Continue diltiazem 240 mg extended release  Essential hypertension Well-controlled. Continue diltiazem and hydrochlorothiazide 25 mg. I've asked patient to stop her irbesartan-hydrochlorothiazide combination pill and replace this with irbesartan 300 mg alone. She will monitor her blood pressures at home and if elevated will return for care. Otherwise she will see Dr. Rayann Montoya in 6 months  Hyperlipidemia Last LDL artificially decreased by high triglycerides. Not clear why her triglycerides swung up from 100s to 380. Patient technically at increased cardiac risk for primary prevention for triglycerides in her age group is unclear. She is not interested in a statin at this time. I advised patient to restart her exercise which should decrease her cardiac risk some and hopefully will help bring her triglycerides back down. She can be rechecked with me in a year or with Dr. Rayann Montoya in 6 months.   Return precautions advised.   Orders Placed This Encounter  Procedures  . Pneumococcal conjugate vaccine 13-valent    Meds ordered this encounter  Medications  . irbesartan (AVAPRO) 300 MG tablet    Sig: Take 1 tablet (300 mg total) by mouth daily.    Dispense:  90 tablet    Refill:  3  . hydrochlorothiazide (HYDRODIURIL) 25 MG tablet    Sig: Take 1 tablet (25 mg total) by mouth daily.    Dispense:  90 tablet    Refill:  3  . diltiazem (CARTIA XT) 240 MG 24 hr capsule    Sig: Take 1 capsule (240 mg total)  by mouth daily.    Dispense:  90 capsule    Refill:  3

## 2014-07-14 NOTE — Assessment & Plan Note (Signed)
Well-controlled. Continue diltiazem 240 mg extended release

## 2014-07-14 NOTE — Patient Instructions (Addendum)
Prevnar today-2nd pneumonia shot.   Stop the irbesartan-hydrochlorothiazide (hctz) combination pill.   Continue the 3 medicines I have listed.   See [name] in 6 months and me in 1 year unless you need me sooner.   Get back on that walking as long as you are feeling well.

## 2014-07-14 NOTE — Assessment & Plan Note (Signed)
Well-controlled. Continue diltiazem and hydrochlorothiazide 25 mg. I've asked patient to stop her irbesartan-hydrochlorothiazide combination pill and replace this with irbesartan 300 mg alone. She will monitor her blood pressures at home and if elevated will return for care. Otherwise she will see [name] in 6 months

## 2014-07-14 NOTE — Assessment & Plan Note (Signed)
Last LDL artificially decreased by high triglycerides. Not clear why her triglycerides swung up from 100s to 380. Patient technically at increased cardiac risk for primary prevention for triglycerides in her age group is unclear. She is not interested in a statin at this time. I advised patient to restart her exercise which should decrease her cardiac risk some and hopefully will help bring her triglycerides back down. She can be rechecked with me in a year or with [name] in 6 months.

## 2014-08-05 ENCOUNTER — Other Ambulatory Visit: Payer: Self-pay | Admitting: Internal Medicine

## 2014-08-10 ENCOUNTER — Other Ambulatory Visit: Payer: Self-pay | Admitting: Dermatology

## 2014-09-08 ENCOUNTER — Ambulatory Visit (INDEPENDENT_AMBULATORY_CARE_PROVIDER_SITE_OTHER): Payer: Medicare Other | Admitting: Family Medicine

## 2014-09-08 ENCOUNTER — Encounter: Payer: Self-pay | Admitting: Family Medicine

## 2014-09-08 VITALS — BP 130/70 | HR 77 | Temp 98.1°F | Wt 128.0 lb

## 2014-09-08 DIAGNOSIS — R109 Unspecified abdominal pain: Secondary | ICD-10-CM

## 2014-09-08 DIAGNOSIS — R1084 Generalized abdominal pain: Secondary | ICD-10-CM

## 2014-09-08 LAB — CBC WITH DIFFERENTIAL/PLATELET
BASOS ABS: 0 10*3/uL (ref 0.0–0.1)
Basophils Relative: 0.4 % (ref 0.0–3.0)
EOS PCT: 0.8 % (ref 0.0–5.0)
Eosinophils Absolute: 0.1 10*3/uL (ref 0.0–0.7)
HCT: 37.3 % (ref 36.0–46.0)
Hemoglobin: 12.8 g/dL (ref 12.0–15.0)
Lymphocytes Relative: 33.3 % (ref 12.0–46.0)
Lymphs Abs: 2.8 10*3/uL (ref 0.7–4.0)
MCHC: 34.3 g/dL (ref 30.0–36.0)
MCV: 91 fl (ref 78.0–100.0)
MONO ABS: 0.7 10*3/uL (ref 0.1–1.0)
MONOS PCT: 8.1 % (ref 3.0–12.0)
NEUTROS PCT: 57.4 % (ref 43.0–77.0)
Neutro Abs: 4.8 10*3/uL (ref 1.4–7.7)
Platelets: 268 10*3/uL (ref 150.0–400.0)
RBC: 4.1 Mil/uL (ref 3.87–5.11)
RDW: 12.9 % (ref 11.5–15.5)
WBC: 8.3 10*3/uL (ref 4.0–10.5)

## 2014-09-08 MED ORDER — METRONIDAZOLE 500 MG PO TABS: 500.0000 mg | ORAL_TABLET | Freq: Three times a day (TID) | ORAL | Status: DC

## 2014-09-08 MED ORDER — CIPROFLOXACIN HCL 500 MG PO TABS: 500.0000 mg | ORAL_TABLET | Freq: Two times a day (BID) | ORAL | Status: DC

## 2014-09-08 NOTE — Patient Instructions (Signed)

## 2014-09-08 NOTE — Progress Notes (Signed)
Pre visit review using our clinic review tool, if applicable. No additional management support is needed unless otherwise documented below in the visit note. 

## 2014-09-08 NOTE — Progress Notes (Signed)
Subjective:    Patient ID: [name], female    DOB: September 21, 1940, 74 y.o.   MRN: [id number]  HPI Patient seen with abdominal pain of 2 days duration. She's had prior history of diverticulitis and symptoms are somewhat similar. Somewhat poorly localized. Some left lower quadrant but also right mid and lower quadrant as well. She describes what she quality of soreness. Worse with movement but not with ambulation. No dysuria. No fevers or chills. No nausea or vomiting. Normal bowel movements and no recent constipation. She has felt somewhat distended. She had CT scan abdomen pelvis Last January which showed diffuse diverticulosis including right sided. She's had presumed diverticulitis in the past.  Past Medical History  Diagnosis Date  . HYPERLIPIDEMIA 09/22/2007  . DEPRESSION 03/11/2007  . 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 CERUMEN IMPACTION, BILATERAL 12/13/2007  . HYPERTENSION 03/11/2007  . DIVERTICULOSIS, COLON 03/11/2007  . Rosacea 03/11/2007  . CERVICAL RADICULOPATHY 11/05/2009  . Arthritis   . Chronic kidney disease     kidney stones  . Kidney stone   . Adenomatous colon polyp 2006  . Tachycardia   . Anxiety   . GERD (gastroesophageal reflux disease) 10/14/2012  . Esophagitis, reflux 10/14/2012  . Benign esophageal stricture 10/14/2012  . Osteopenia   . Rectocele   . HEMORRHOIDS, EXTERNAL W/O COMPLICATION 6/33/3545    Qualifier: Diagnosis of  By: Jimmye Norman LPN, Winfield Cunas    Past Surgical History  Procedure Laterality Date  . Kidney surgery      age 65  . Cervical fusion      C4-6  . Tonsillectomy    . Colonoscopy      multiple  . Anterior cervical decomp/discectomy fusion  08/14/2011    Procedure: ANTERIOR CERVICAL DECOMPRESSION/DISCECTOMY FUSION 1 LEVEL/HARDWARE REMOVAL;  Surgeon: [name], MD;  Location: [facility];  Service: Neurosurgery;  Laterality: N/A;  Cervical Six-Seven anterior cervical decompression with fusion interbody prothesis plating  and bonegraft with removal of hardware of Cervical Four to Cervical Six   . Esophagogastroduodenoscopy  10/14/2012    Dr. [name]    reports that she has never smoked. She has never used smokeless tobacco. She reports that she does not drink alcohol or use illicit drugs. family history includes Cerebral palsy in her sister; Diabetes in her maternal grandmother; Heart disease in her father; Hypertension in her father; Lung cancer in her father; Mental illness in her mother. There is no history of Colon cancer or Stomach cancer. Allergies  Allergen Reactions  . Codeine Nausea Only      Review of Systems  Constitutional: Negative for fever and chills.  Respiratory: Negative for shortness of breath.   Cardiovascular: Negative for chest pain.  Gastrointestinal: Positive for abdominal pain. Negative for nausea, vomiting, diarrhea, constipation, blood in stool and anal bleeding.  Neurological: Negative for dizziness.       Objective:   Physical Exam  Constitutional: She appears well-developed and well-nourished.  Cardiovascular: Normal rate and regular rhythm.   Pulmonary/Chest: Effort normal and breath sounds normal. No respiratory distress. She has no wheezes. She has no rales.  Abdominal: Soft. Bowel sounds are normal. She exhibits no distension and no mass. There is tenderness. There is no rebound and no guarding.  She has some mild tenderness left lower quadrant and greater tenderness right mid and lower quadrants. This is very poorly localized. No guarding or rebound. No mass. No right upper quadrant tenderness  Assessment & Plan:  Abdominal pain. She has somewhat diffuse pain and no history of constipation. Suspect acute diverticulitis though location not classic. However, on previous CT scanning she had fairly diffuse diverticulosis changes. Doubt appendicitis as her pain is more right mid quadrant and left lower. Check CBC. Start Cipro 500 mg twice a day for 10 days  and Flagyl 500 mg 3 times a day for 10 days. Follow-up immediately for any fever, vomiting, or worsening pain

## 2014-09-09 ENCOUNTER — Other Ambulatory Visit: Payer: Self-pay | Admitting: Internal Medicine

## 2014-10-25 ENCOUNTER — Other Ambulatory Visit: Payer: Self-pay | Admitting: Internal Medicine

## 2014-10-30 ENCOUNTER — Other Ambulatory Visit: Payer: Self-pay

## 2014-10-30 MED ORDER — ESCITALOPRAM OXALATE 20 MG PO TABS: ORAL_TABLET | ORAL | Status: DC

## 2014-10-30 NOTE — Telephone Encounter (Signed)
Rx request for Escitalopram 20 mg tablets- Take 1 tablet by mouth every day #90  Pharm:  [facility] [place]

## 2014-11-02 LAB — HM MAMMOGRAPHY: HM MAMMO: NORMAL

## 2014-11-06 ENCOUNTER — Encounter: Payer: Self-pay | Admitting: Family Medicine

## 2014-11-08 ENCOUNTER — Encounter: Payer: Self-pay | Admitting: Gynecology

## 2014-11-27 ENCOUNTER — Encounter: Payer: Self-pay | Admitting: Internal Medicine

## 2014-11-27 ENCOUNTER — Ambulatory Visit (INDEPENDENT_AMBULATORY_CARE_PROVIDER_SITE_OTHER): Payer: Medicare Other | Admitting: Internal Medicine

## 2014-11-27 VITALS — BP 162/88 | HR 64 | Ht 61.75 in | Wt 127.8 lb

## 2014-11-27 DIAGNOSIS — I10 Essential (primary) hypertension: Secondary | ICD-10-CM | POA: Diagnosis not present

## 2014-11-27 DIAGNOSIS — I471 Supraventricular tachycardia: Secondary | ICD-10-CM | POA: Diagnosis not present

## 2014-11-27 NOTE — Patient Instructions (Signed)
Your physician wants you to follow-up in: 12 months with Dr Vallery Ridge will receive a reminder letter in the mail two months in advance. If you don't receive a letter, please call our office to schedule the follow-up appointment.  Your physician recommends that you schedule a follow-up appointment in: 6 weeks with Dr Yong Channel for blood pressure follow up  Watch salt, get a BP cuff to take BP at home Log them and take with you to Dr Yong Channel.  Make sure to take your medications before taking pressures

## 2014-11-27 NOTE — Progress Notes (Signed)
Electrophysiology Office Note   Date:  11/27/2014   ID:  [name], DOB Aug 16, 1940, MRN [id number]  PCP:  Garret Reddish, MD   Primary Electrophysiologist: Thompson Grayer, MD    Chief Complaint  Patient presents with  . Appointment    SVT     History of Present Illness: [name] is a 74 y.o. female who presents today for electrophysiology evaluation.   She is doing well.  She has anxiety and "stress" which causes her heart rates to be high at times.  She feels that her SVT is controlled.   She does not check her BP at home.  She does not avoid salt.  Today, she denies symptoms of palpitations, chest pain, shortness of breath, orthopnea, PND, lower extremity edema, claudication, dizziness, presyncope, syncope, bleeding, or neurologic sequela. The patient is tolerating medications without difficulties and is otherwise without complaint today.    Past Medical History  Diagnosis Date  . HYPERLIPIDEMIA 09/22/2007  . DEPRESSION 03/11/2007  . 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 CERUMEN IMPACTION, BILATERAL 12/13/2007  . HYPERTENSION 03/11/2007  . DIVERTICULOSIS, COLON 03/11/2007  . Rosacea 03/11/2007  . CERVICAL RADICULOPATHY 11/05/2009  . Arthritis   . Chronic kidney disease     kidney stones  . Kidney stone   . Adenomatous colon polyp 2006  . Tachycardia   . Anxiety   . GERD (gastroesophageal reflux disease) 10/14/2012  . Esophagitis, reflux 10/14/2012  . Benign esophageal stricture 10/14/2012  . Osteopenia   . Rectocele   . HEMORRHOIDS, EXTERNAL W/O COMPLICATION 1/60/7371    Qualifier: Diagnosis of  By: Jimmye Norman, LPN, Winfield Cunas   . Glaucoma    Past Surgical History  Procedure Laterality Date  . Kidney surgery      age 89  . Cervical fusion      C4-6  . Tonsillectomy    . Colonoscopy      multiple  . Anterior cervical decomp/discectomy fusion  08/14/2011    Procedure: ANTERIOR CERVICAL DECOMPRESSION/DISCECTOMY FUSION 1 LEVEL/HARDWARE REMOVAL;   Surgeon: [name], MD;  Location: [facility];  Service: Neurosurgery;  Laterality: N/A;  Cervical Six-Seven anterior cervical decompression with fusion interbody prothesis plating and bonegraft with removal of hardware of Cervical Four to Cervical Six   . Esophagogastroduodenoscopy  10/14/2012    Dr. [name]     Current Outpatient Prescriptions  Medication Sig Dispense Refill  . ALPRAZolam (XANAX) 0.25 MG tablet TAKE 1 TABLET BY MOUTH EVERY DAY AS NEEDED (Patient taking differently: TAKE 1 TABLET BY MOUTH EVERY DAY AS NEEDED FOR ANXIETY) 30 tablet 0  . BIOTIN PO Take 1 capsule by mouth daily.     . Calcium Carbonate-Vitamin D (CALCIUM + D PO) Take 1 tablet by mouth daily.      [name] diltiazem (CARTIA XT) 240 MG 24 hr capsule Take 1 capsule (240 mg total) by mouth daily. 90 capsule 3  . escitalopram (LEXAPRO) 20 MG tablet TAKE 1 TABLET BY MOUTH EVERY DAY 90 tablet 2  . hydrochlorothiazide (HYDRODIURIL) 25 MG tablet Take 1 tablet (25 mg total) by mouth daily. 90 tablet 3  . irbesartan (AVAPRO) 300 MG tablet Take 1 tablet (300 mg total) by mouth daily. 90 tablet 3  . Multiple Vitamins-Minerals (ICAPS PO) Take 1 capsule by mouth daily.    . pantoprazole (PROTONIX) 40 MG tablet TAKE 1 TABLET BY MOUTH EVERY MORNING BEFORE BREAKFAST 30 tablet 2  . timolol (TIMOPTIC) 0.5 % ophthalmic solution Place  1 drop into both eyes every morning.      No current facility-administered medications for this visit.    Allergies:   Codeine   Social History:  The patient  reports that she has never smoked. She has never used smokeless tobacco. She reports that she does not drink alcohol or use illicit drugs.   Family History:  The patient's  family history includes Cerebral palsy in her sister; Diabetes in her maternal grandmother; Glaucoma in her mother; Heart disease in her father; Hypertension in her father; Lung cancer in her father; Mental illness in her mother; Stroke in her paternal aunt, paternal  aunt, paternal uncle, paternal uncle, paternal uncle, and sister. There is no history of Colon cancer or Stomach cancer.    ROS:  Please see the history of present illness.   All other systems are reviewed and negative.    PHYSICAL EXAM: VS:  BP 162/88 mmHg  Pulse 64  Ht 5' 1.75" (1.568 m)  Wt 127 lb 12.8 oz (57.97 kg)  BMI 23.58 kg/m2  LMP 08/12/1991 , BMI Body mass index is 23.58 kg/(m^2). GEN: Well nourished, well developed, in no acute distress HEENT: normal Neck: no JVD, carotid bruits, or masses Cardiac: RRR; no murmurs, rubs, or gallops,no edema  Respiratory:  clear to auscultation bilaterally, normal work of breathing GI: soft, nontender, nondistended, + BS MS: no deformity or atrophy Skin: warm and dry  Neuro:  Strength and sensation are intact Psych: euthymic mood, full affect  EKG:  EKG is ordered today. The ekg ordered today shows sinus rhythm, LHAB   Recent Labs: 09/08/2014: Hemoglobin 12.8; Platelets 268.0    Lipid Panel     Component Value Date/Time   CHOL 194 10/10/2013 1449   TRIG 380.0* 10/10/2013 1449   HDL 35.80* 10/10/2013 1449   CHOLHDL 5 10/10/2013 1449   VLDL 76.0* 10/10/2013 1449   LDLCALC 82 10/10/2013 1449   LDLDIRECT 151.0 11/19/2012 1050     Wt Readings from Last 3 Encounters:  11/27/14 127 lb 12.8 oz (57.97 kg)  09/08/14 128 lb (58.06 kg)  07/14/14 130 lb (58.968 kg)     ASSESSMENT AND PLAN:  1.  Hypertension Elevated today She has not taken her medicines today 2 gram sodium restriction and medicine compliance are advised I have encouraged her to obtain a BP cuff and to start checking her BP several times per week She should follow-up with Dr Yong Channel in 6 weeks for repeat BP check   2. SVT Reasonably controlledShe is clear that she is not interested in ablation    Current medicines are reviewed at length with the patient today.   The patient does not have concerns regarding her medicines.  The following changes were made  today:  none  Follow-up with Dr Yong Channel in 6 weeks, I will see in a year   Signed, Thompson Grayer, MD  11/27/2014 11:01 AM     [facility] [address] [place] [address] [phone] (office) [phone] (fax)

## 2014-11-29 ENCOUNTER — Other Ambulatory Visit: Payer: Self-pay | Admitting: Family Medicine

## 2014-11-30 NOTE — Telephone Encounter (Signed)
Can give 3 months worth but ask her to see me in that time. I did not specifically address anxiety or sleep during her initial visit and we will need to for longer term rx.

## 2014-11-30 NOTE — Telephone Encounter (Signed)
Ok to refill 

## 2015-01-09 ENCOUNTER — Ambulatory Visit (INDEPENDENT_AMBULATORY_CARE_PROVIDER_SITE_OTHER): Payer: Medicare Other | Admitting: Family Medicine

## 2015-01-09 ENCOUNTER — Encounter: Payer: Self-pay | Admitting: Family Medicine

## 2015-01-09 VITALS — BP 110/64 | HR 80 | Temp 98.7°F | Wt 129.0 lb

## 2015-01-09 DIAGNOSIS — I10 Essential (primary) hypertension: Secondary | ICD-10-CM | POA: Diagnosis not present

## 2015-01-09 NOTE — Progress Notes (Signed)
Garret Reddish, MD  Subjective:  [name] is a 74 y.o. year old very pleasant female patient who presents with:  Hypertension-good control in office  BP Readings from Last 3 Encounters:  01/09/15 110/64  11/27/14 162/88-off meds that AM  09/08/14 130/70   Home BP monitoring-67% of home readings <135/90. All DBP controlled. 1 reading >150, other between 140 and 150.  Compliant with medications-yes without side effects, at least diltiazem and HCTZ. Not clear on irbesartan.  ROS-Denies any CP, HA, SOB, blurry vision, LE edema.   Past Medical History- SVT, HLD, Depression, IBS  Medications- reviewed and updated Current Outpatient Prescriptions  Medication Sig Dispense Refill  . BIOTIN PO Take 1 capsule by mouth daily.     . Calcium Carbonate-Vitamin D (CALCIUM + D PO) Take 1 tablet by mouth daily.      [name] diltiazem (CARTIA XT) 240 MG 24 hr capsule Take 1 capsule (240 mg total) by mouth daily. 90 capsule 3  . escitalopram (LEXAPRO) 20 MG tablet TAKE 1 TABLET BY MOUTH EVERY DAY 90 tablet 2  . hydrochlorothiazide (HYDRODIURIL) 25 MG tablet Take 1 tablet (25 mg total) by mouth daily. 90 tablet 3  . irbesartan (AVAPRO) 300 MG tablet Take 1 tablet (300 mg total) by mouth daily. 90 tablet 3  . Multiple Vitamins-Minerals (ICAPS PO) Take 1 capsule by mouth daily.    . pantoprazole (PROTONIX) 40 MG tablet TAKE 1 TABLET BY MOUTH EVERY MORNING BEFORE BREAKFAST 30 tablet 2  . timolol (TIMOPTIC) 0.5 % ophthalmic solution Place 1 drop into both eyes every morning.     [name] ALPRAZolam (XANAX) 0.25 MG tablet TAKE 1 TABLET BY MOUTH EVERY DAY AS NEEDED (Patient not taking: Reported on 01/09/2015) 30 tablet 3   No current facility-administered medications for this visit.    Objective: BP 110/64 mmHg  Pulse 80  Temp(Src) 98.7 F (37.1 C)  Wt 129 lb (58.514 kg)  LMP 08/12/1991 Gen: NAD, resting comfortably CV: RRR no murmurs rubs or gallops Lungs: CTAB no crackles, wheeze, rhonchi Abdomen:  soft/nontender/nondistended/normal bowel sounds.  Ext: no edema Skin: warm, dry Neuro: grossly normal, moves all extremities   Assessment/Plan:  Essential hypertension Controlled today. Patient supposed to be on cartia XT 240mg , HCTZ 25mg , irbesartan 300 (had previously stopped irbesartan-hctz combo pill but sounds like she may have stopped irbesartan solo as well). She is to call back and let us know if she is taking all 3. Restart irbesartan if she is not currently taking, but likely hold off on adding amlodipine unless regulary >145/90.     >50% of 15 minute office visit was spent on counseling (importance of bringing all meds, BP goals, home monitoring) and coordination of care

## 2015-01-09 NOTE — Assessment & Plan Note (Signed)
Controlled today. Patient supposed to be on cartia XT 240mg , HCTZ 25mg , irbesartan 300 (had previously stopped irbesartan-hctz combo pill but sounds like she may have stopped irbesartan solo as well). She is to call back and let us know if she is taking all 3. Restart irbesartan if she is not currently taking, but likely hold off on adding amlodipine unless regulary >145/90.

## 2015-01-09 NOTE — Patient Instructions (Signed)
Continue medicines as listed on sheet. Call us if you are not taking all 3 medicines I circled.

## 2015-01-22 ENCOUNTER — Encounter: Payer: Self-pay | Admitting: Gynecology

## 2015-01-22 ENCOUNTER — Ambulatory Visit (INDEPENDENT_AMBULATORY_CARE_PROVIDER_SITE_OTHER): Payer: Medicare Other | Admitting: Gynecology

## 2015-01-22 VITALS — BP 114/70 | Ht 60.0 in | Wt 128.0 lb

## 2015-01-22 DIAGNOSIS — M8580 Other specified disorders of bone density and structure, unspecified site: Secondary | ICD-10-CM | POA: Diagnosis not present

## 2015-01-22 DIAGNOSIS — Z01419 Encounter for gynecological examination (general) (routine) without abnormal findings: Secondary | ICD-10-CM | POA: Diagnosis not present

## 2015-01-22 DIAGNOSIS — R102 Pelvic and perineal pain: Secondary | ICD-10-CM

## 2015-01-22 DIAGNOSIS — N816 Rectocele: Secondary | ICD-10-CM | POA: Diagnosis not present

## 2015-01-22 DIAGNOSIS — N952 Postmenopausal atrophic vaginitis: Secondary | ICD-10-CM

## 2015-01-22 NOTE — Progress Notes (Signed)
[name] June 10, 1941 [id number]        74 y.o.  [id number] for breast and pelvic exam. Several issues noted below.  Past medical history,surgical history, problem list, medications, allergies, family history and social history were all reviewed and documented as reviewed in the EPIC chart.  ROS:  Performed with pertinent positives and negatives included in the history, assessment and plan.   Additional significant findings :  none   Exam: cam assistant Filed Vitals:   01/22/15 1359  BP: 114/70  Height: 5' (1.524 m)  Weight: 128 lb (58.06 kg)   General appearance:  Normal affect, orientation and appearance. Skin: Grossly normal HEENT: Without gross lesions.  No cervical or supraclavicular adenopathy. Thyroid normal.  Lungs:  Clear without wheezing, rales or rhonchi Cardiac: RR, without RMG Abdominal:  Soft, nontender, without masses, guarding, rebound, organomegaly or hernia Breasts:  Examined lying and sitting without masses, retractions, discharge or axillary adenopathy. Pelvic:  Ext/BUS/vagina with atrophic changes.  First to second-degree rectocele noted.  Cervix atrophic flush with upper vagina.  Uterus axial, normal size, shape and contour, midline and mobile nontender   Adnexa  Without masses or tenderness    Anus and perineum  Normal   Rectovaginal  Normal sphincter tone without palpated masses or tenderness.    Assessment/Plan:  74 y.o. [id number] female for breast and pelvic exam.   1. Osteopenia. DEXA 2012 T score -1.1. Repeat DEXA now at 4 year interval. Increased calcium vitamin D reviewed. 2. Bladder discomfort. Patient know she feels pressure and discomfort in the bladder region. Exam is overall normal. Check urinalysis and ultrasound to rule out nonpalpable GYN source of her pressure.  No diarrhea or constipation to suggest GI. No frequency dysuria or urgency. Patient describes an ill-defined pressure-like symptom. 3. Atrophic genital changes. Patient without  significant hot flushes, night sweats, vaginal dryness and no vaginal bleeding. Continue to monitor and report any vaginal bleeding. 4. Rectocele. Overall stable from prior exam. Patient is asymptomatic without pressure or stool trapping. Continue to monitor report any issues. 5. Pap smear 2012. No Pap smear done today. No history of significant abnormal Pap smears. We both agree to stop screening per current screening guidelines and she is over the age of 35. 59. Mammography 10/2014. Continue with annual mammography. SBE monthly reviewed. 7. Colonoscopy 2013. Repeat at their recommended interval. 8. Health maintenance. No routine blood work done as this is done at Dr. Ansel Bong office.  Follow up for bone density and ultrasound as scheduled.     Anastasio Auerbach MD, 2:21 PM 01/22/2015

## 2015-01-22 NOTE — Patient Instructions (Signed)
Follow up for the bone density as scheduled.  Follow up for the ultrasound as scheduled.

## 2015-01-23 LAB — URINALYSIS W MICROSCOPIC + REFLEX CULTURE
BILIRUBIN URINE: NEGATIVE
Bacteria, UA: NONE SEEN
CASTS: NONE SEEN
Crystals: NONE SEEN
Glucose, UA: NEGATIVE mg/dL
HGB URINE DIPSTICK: NEGATIVE
Ketones, ur: NEGATIVE mg/dL
Leukocytes, UA: NEGATIVE
Nitrite: NEGATIVE
Protein, ur: NEGATIVE mg/dL
Specific Gravity, Urine: 1.009 (ref 1.005–1.030)
Squamous Epithelial / LPF: NONE SEEN
Urobilinogen, UA: 0.2 mg/dL (ref 0.0–1.0)
pH: 6.5 (ref 5.0–8.0)

## 2015-01-25 ENCOUNTER — Other Ambulatory Visit: Payer: Self-pay | Admitting: Internal Medicine

## 2015-02-06 ENCOUNTER — Encounter (HOSPITAL_BASED_OUTPATIENT_CLINIC_OR_DEPARTMENT_OTHER): Payer: Self-pay

## 2015-02-06 ENCOUNTER — Emergency Department (HOSPITAL_BASED_OUTPATIENT_CLINIC_OR_DEPARTMENT_OTHER)
Admission: EM | Admit: 2015-02-06 | Discharge: 2015-02-06 | Disposition: A | Discharge status: Home / Self Care | Payer: Medicare Other | Attending: Emergency Medicine | Admitting: Emergency Medicine

## 2015-02-06 ENCOUNTER — Emergency Department (HOSPITAL_BASED_OUTPATIENT_CLINIC_OR_DEPARTMENT_OTHER): Payer: Medicare Other

## 2015-02-06 DIAGNOSIS — I129 Hypertensive chronic kidney disease with stage 1 through stage 4 chronic kidney disease, or unspecified chronic kidney disease: Secondary | ICD-10-CM | POA: Diagnosis not present

## 2015-02-06 DIAGNOSIS — F329 Major depressive disorder, single episode, unspecified: Secondary | ICD-10-CM | POA: Diagnosis not present

## 2015-02-06 DIAGNOSIS — Z8619 Personal history of other infectious and parasitic diseases: Secondary | ICD-10-CM | POA: Insufficient documentation

## 2015-02-06 DIAGNOSIS — K219 Gastro-esophageal reflux disease without esophagitis: Secondary | ICD-10-CM | POA: Diagnosis not present

## 2015-02-06 DIAGNOSIS — S61216A Laceration without foreign body of right little finger without damage to nail, initial encounter: Secondary | ICD-10-CM | POA: Diagnosis not present

## 2015-02-06 DIAGNOSIS — M1990 Unspecified osteoarthritis, unspecified site: Secondary | ICD-10-CM | POA: Diagnosis not present

## 2015-02-06 DIAGNOSIS — Y998 Other external cause status: Secondary | ICD-10-CM | POA: Diagnosis not present

## 2015-02-06 DIAGNOSIS — Z79899 Other long term (current) drug therapy: Secondary | ICD-10-CM | POA: Insufficient documentation

## 2015-02-06 DIAGNOSIS — H9190 Unspecified hearing loss, unspecified ear: Secondary | ICD-10-CM | POA: Diagnosis not present

## 2015-02-06 DIAGNOSIS — W231XXA Caught, crushed, jammed, or pinched between stationary objects, initial encounter: Secondary | ICD-10-CM | POA: Diagnosis not present

## 2015-02-06 DIAGNOSIS — Z23 Encounter for immunization: Secondary | ICD-10-CM | POA: Insufficient documentation

## 2015-02-06 DIAGNOSIS — Z87442 Personal history of urinary calculi: Secondary | ICD-10-CM | POA: Diagnosis not present

## 2015-02-06 DIAGNOSIS — M8580 Other specified disorders of bone density and structure, unspecified site: Secondary | ICD-10-CM | POA: Insufficient documentation

## 2015-02-06 DIAGNOSIS — E785 Hyperlipidemia, unspecified: Secondary | ICD-10-CM | POA: Diagnosis not present

## 2015-02-06 DIAGNOSIS — Y9389 Activity, other specified: Secondary | ICD-10-CM | POA: Diagnosis not present

## 2015-02-06 DIAGNOSIS — N189 Chronic kidney disease, unspecified: Secondary | ICD-10-CM | POA: Diagnosis not present

## 2015-02-06 DIAGNOSIS — F419 Anxiety disorder, unspecified: Secondary | ICD-10-CM | POA: Insufficient documentation

## 2015-02-06 DIAGNOSIS — H409 Unspecified glaucoma: Secondary | ICD-10-CM | POA: Insufficient documentation

## 2015-02-06 DIAGNOSIS — Z86010 Personal history of colonic polyps: Secondary | ICD-10-CM | POA: Insufficient documentation

## 2015-02-06 DIAGNOSIS — S61212A Laceration without foreign body of right middle finger without damage to nail, initial encounter: Secondary | ICD-10-CM | POA: Insufficient documentation

## 2015-02-06 DIAGNOSIS — Y9289 Other specified places as the place of occurrence of the external cause: Secondary | ICD-10-CM | POA: Insufficient documentation

## 2015-02-06 MED ORDER — TETANUS-DIPHTH-ACELL PERTUSSIS 5-2.5-18.5 LF-MCG/0.5 IM SUSP
0.5000 mL | Freq: Once | INTRAMUSCULAR | Status: AC
  Administered 2015-02-06: 0.5 mL via INTRAMUSCULAR
  Filled 2015-02-06: qty 0.5

## 2015-02-06 MED ORDER — LIDOCAINE HCL 2 % IJ SOLN
20.0000 mL | Freq: Once | INTRAMUSCULAR | Status: AC
  Administered 2015-02-06: 400 mg
  Filled 2015-02-06: qty 20

## 2015-02-06 NOTE — ED Notes (Addendum)
Pt right hand was closed in a storm door approx 1030am-lac to right middle and ring fingers-bruising to hand

## 2015-02-06 NOTE — ED Notes (Signed)
Patient preparing for discharge. 

## 2015-02-06 NOTE — ED Provider Notes (Signed)
CSN: [id number]     Arrival date & time 02/06/15  1449 History   First MD Initiated Contact with Patient 02/06/15 1508     Chief Complaint  Patient presents with  . Extremity Laceration     (Consider location/radiation/quality/duration/timing/severity/associated sxs/prior Treatment) Patient is a 74 y.o. female presenting with hand injury.  Hand Injury Location:  Hand Time since incident:  5 hours Injury: yes   Mechanism of injury: crush   Crush injury:    Mechanism:  Door   Duration of crushing force: brief. Hand location:  R hand Pain details:    Quality:  Dull   Radiates to:  Does not radiate   Severity:  Moderate   Onset quality:  Sudden   Duration:  5 hours   Timing:  Constant   Progression:  Unchanged Chronicity:  New Tetanus status:  Unknown Relieved by: wrapping fingers. Worsened by:  Movement Associated symptoms: no muscle weakness, no numbness, no swelling and no tingling     Past Medical History  Diagnosis Date  . HYPERLIPIDEMIA 09/22/2007  . DEPRESSION 03/11/2007  . 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 CERUMEN IMPACTION, BILATERAL 12/13/2007  . HYPERTENSION 03/11/2007  . DIVERTICULOSIS, COLON 03/11/2007  . Rosacea 03/11/2007  . CERVICAL RADICULOPATHY 11/05/2009  . Arthritis   . Chronic kidney disease     kidney stones  . Kidney stone   . Adenomatous colon polyp 2006  . Tachycardia   . Anxiety   . GERD (gastroesophageal reflux disease) 10/14/2012  . Esophagitis, reflux 10/14/2012  . Benign esophageal stricture 10/14/2012  . Osteopenia 2012    T score -1.1  . Rectocele   . HEMORRHOIDS, EXTERNAL W/O COMPLICATION 4/65/0354    Qualifier: Diagnosis of  By: Jimmye Norman, LPN, [place] [facility]   . Glaucoma   . Hearing loss    Past Surgical History  Procedure Laterality Date  . Kidney surgery      age 45  . Cervical fusion      C4-6  . Tonsillectomy    . Colonoscopy      multiple  . Anterior cervical decomp/discectomy fusion  08/14/2011     Procedure: ANTERIOR CERVICAL DECOMPRESSION/DISCECTOMY FUSION 1 LEVEL/HARDWARE REMOVAL;  Surgeon: [name], MD;  Location: [facility];  Service: Neurosurgery;  Laterality: N/A;  Cervical Six-Seven anterior cervical decompression with fusion interbody prothesis plating and bonegraft with removal of hardware of Cervical Four to Cervical Six   . Esophagogastroduodenoscopy  10/14/2012    Dr. [name]   Family History  Problem Relation Age of Onset  . Mental illness Mother     comitted suicide  . Glaucoma Mother   . Lung cancer Father     smoker  . Hypertension Father   . Heart disease Father     age 89, smoker  . Cerebral palsy Sister   . Stroke Sister     multiple  . Diabetes Maternal Grandmother   . Colon cancer Neg Hx   . Stomach cancer Neg Hx   . Stroke Paternal Aunt   . Stroke Paternal Uncle   . Stroke Paternal Uncle   . Stroke Paternal Uncle   . Stroke Paternal Aunt    History  Substance Use Topics  . Smoking status: Never Smoker   . Smokeless tobacco: Never Used  . Alcohol Use: No   OB History    Gravida Para Term Preterm AB TAB SAB Ectopic Multiple Living   5 5 5  5     Review of Systems  All other systems reviewed and are negative.     Allergies  Codeine  Home Medications   Prior to Admission medications   Medication Sig Start Date End Date Taking? Authorizing Provider  ALPRAZolam Duanne Moron) 0.25 MG tablet TAKE 1 TABLET BY MOUTH EVERY DAY AS NEEDED 11/30/14   [name], MD  BIOTIN PO Take 1 capsule by mouth daily.     Historical Provider, MD  Calcium Carbonate-Vitamin D (CALCIUM + D PO) Take 1 tablet by mouth daily.      Historical Provider, MD  diltiazem (CARTIA XT) 240 MG 24 hr capsule Take 1 capsule (240 mg total) by mouth daily. 07/14/14   [name], MD  escitalopram (LEXAPRO) 20 MG tablet TAKE 1 TABLET BY MOUTH EVERY DAY 10/30/14   [name], MD  hydrochlorothiazide (HYDRODIURIL) 25 MG tablet Take 1 tablet (25 mg total) by  mouth daily. 07/14/14   [name], MD  irbesartan (AVAPRO) 300 MG tablet Take 1 tablet (300 mg total) by mouth daily. 07/14/14   [name], MD  Multiple Vitamins-Minerals (ICAPS PO) Take 1 capsule by mouth daily.    Historical Provider, MD  pantoprazole (PROTONIX) 40 MG tablet TAKE 1 TABLET BY MOUTH EVERY MORNING BEFORE BREAKFAST 01/25/15   Gatha Mayer, MD  timolol (TIMOPTIC) 0.5 % ophthalmic solution Place 1 drop into both eyes every morning.  10/08/12   Historical Provider, MD   BP 160/75 mmHg  Pulse 88  Temp(Src) 98.5 F (36.9 C) (Oral)  Resp 20  Ht 5' (1.524 m)  Wt 127 lb (57.607 kg)  BMI 24.80 kg/m2  SpO2 97%  LMP 08/12/1991 Physical Exam  Constitutional: She is oriented to person, place, and time. She appears well-developed and well-nourished. No distress.  HENT:  Head: Normocephalic and atraumatic.  Eyes: Conjunctivae are normal. No scleral icterus.  Neck: Neck supple.  Cardiovascular: Normal rate and intact distal pulses.   Pulmonary/Chest: Effort normal. No stridor. No respiratory distress.  Abdominal: Normal appearance. She exhibits no distension.  Neurological: She is alert and oriented to person, place, and time.  Skin: Skin is warm and dry. No rash noted.  Right middle finger: 2cm laceration on dorsal surface overlying 1st phalanx.  Tendon exposed and appears undamaged.  Bleeding controlled. Right 5th finger: 2cm laceration to palmar surface overlying distal phalanges.  No visible underlying structures.  Bleeding controlled. Motor, tendon, sensation, cap refill of all fingers of right hand intact.    Psychiatric: She has a normal mood and affect. Her behavior is normal.  Nursing note and vitals reviewed.   ED Course  LACERATION REPAIR Date/Time: 02/06/2015 4:21 PM Performed by: Serita Grit Authorized by: Serita Grit Consent: Verbal consent obtained. Risks and benefits: risks, benefits and alternatives were discussed Body area: upper  extremity Location details: right long finger Laceration length: 2 cm Foreign bodies: no foreign bodies Tendon involvement: none Nerve involvement: none Vascular damage: no Anesthesia: digital block Local anesthetic: lidocaine 2% without epinephrine Anesthetic total: 3 ml Patient sedated: no Preparation: Patient was prepped and draped in the usual sterile fashion. Irrigation solution: saline Irrigation method: jet lavage Amount of cleaning: standard Debridement: none Degree of undermining: none Skin closure: 4-0 Prolene Number of sutures: 3 Technique: simple Approximation: close Approximation difficulty: simple Dressing: antibiotic ointment and gauze roll Patient tolerance: Patient tolerated the procedure well with no immediate complications  LACERATION REPAIR Date/Time: 02/06/2015 4:22 PM Performed by: Serita Grit Authorized  by: Serita Grit Consent: Verbal consent obtained. Risks and benefits: risks, benefits and alternatives were discussed Consent given by: patient Body area: upper extremity Location details: right small finger Laceration length: 3 cm Foreign bodies: no foreign bodies Tendon involvement: none Nerve involvement: none Vascular damage: no Anesthesia: digital block Local anesthetic: lidocaine 2% without epinephrine Anesthetic total: 2 ml Preparation: Patient was prepped and draped in the usual sterile fashion. Irrigation solution: saline Irrigation method: jet lavage Amount of cleaning: standard Debridement: none Degree of undermining: none Skin closure: 4-0 Prolene Number of sutures: 5 Technique: simple Approximation: close Approximation difficulty: simple Dressing: antibiotic ointment, gauze roll and splint Patient tolerance: Patient tolerated the procedure well with no immediate complications   (including critical care time) Labs Review Labs Reviewed - No data to display  Imaging Review Dg Hand Complete Right  02/06/2015   CLINICAL  DATA:  Crush injury in door, initial encounter  EXAM: RIGHT HAND - COMPLETE 3+ VIEW  COMPARISON:  None.  FINDINGS: No acute fracture or dislocation is noted. Soft tissue injury is noted consistent with the given clinical history. Minimal degenerative changes are noted in the interphalangeal joints.  IMPRESSION: Soft tissue injury without acute bony abnormality.   Electronically Signed   By: Inez Catalina M.D.   On: 02/06/2015 15:38     EKG Interpretation None      MDM   Final diagnoses:  Laceration of right middle finger w/o foreign body w/o damage to nail, initial encounter  Laceration of right little finger w/o foreign body w/o damage to nail, initial encounter    Lacerations repaired.  Plain films negative.  Tissue exam not consistent with severe crush injury.      Serita Grit, MD 02/06/15 [phone]

## 2015-02-09 ENCOUNTER — Telehealth: Payer: Self-pay | Admitting: Family Medicine

## 2015-02-09 DIAGNOSIS — M8580 Other specified disorders of bone density and structure, unspecified site: Secondary | ICD-10-CM

## 2015-02-09 HISTORY — DX: Other specified disorders of bone density and structure, unspecified site: M85.80

## 2015-02-09 NOTE — Telephone Encounter (Signed)
No can do in 15 minutes and can use same day

## 2015-02-09 NOTE — Telephone Encounter (Signed)
See below

## 2015-02-09 NOTE — Telephone Encounter (Signed)
Pt has stitches in her right pink and index fiinger and need to have stiches remove on 02/16/15 . Will thjis  require a 30 minute appt and if so can I use sameday

## 2015-02-13 ENCOUNTER — Ambulatory Visit (INDEPENDENT_AMBULATORY_CARE_PROVIDER_SITE_OTHER): Payer: Medicare Other

## 2015-02-13 DIAGNOSIS — M899 Disorder of bone, unspecified: Secondary | ICD-10-CM

## 2015-02-13 DIAGNOSIS — M8580 Other specified disorders of bone density and structure, unspecified site: Secondary | ICD-10-CM

## 2015-02-14 ENCOUNTER — Encounter: Payer: Self-pay | Admitting: Gynecology

## 2015-02-16 ENCOUNTER — Encounter: Payer: Self-pay | Admitting: Family Medicine

## 2015-02-16 ENCOUNTER — Ambulatory Visit (INDEPENDENT_AMBULATORY_CARE_PROVIDER_SITE_OTHER): Payer: Medicare Other | Admitting: Family Medicine

## 2015-02-16 VITALS — BP 130/80 | HR 77 | Temp 98.3°F | Wt 130.0 lb

## 2015-02-16 DIAGNOSIS — Z5189 Encounter for other specified aftercare: Secondary | ICD-10-CM

## 2015-02-16 DIAGNOSIS — S61411A Laceration without foreign body of right hand, initial encounter: Secondary | ICD-10-CM | POA: Diagnosis not present

## 2015-02-16 NOTE — Patient Instructions (Signed)
Things look good. Took 8 stitches out. Return if fever, chills, worsening pain, expanding redness on the hand

## 2015-02-16 NOTE — Progress Notes (Signed)
Garret Reddish, MD  Subjective:  [name] is a 74 y.o. year old very pleasant female patient who presents with:  Extremity Laceration -Crushed her hand in a storm door 5 hours prior to ED visit, wrapped her fingers to stop blood. Did not have numbness, tingling, muscle weakness. Her middle finger on right hand had 2 cm laceration over 1st phalanx with tendon exposed but undamaged. Also has right 5th finger laceration. Eventually went to ED when she saw her tendons exposed on top of 3rd finger. 3 sutures placed on 3rd finger and 5 sutures were placed on 5th finger.   Today, states she has mild aching pain especially when using her hands. Slowly improving. Not taking meds to help. No radiation of pain. Hurts through most of day.   ROS- no fever, chills, nausea, vomiting, expanding redness on hand  Past Medical History- history SVT, depression, HTN, HLD, IBS  Medications- reviewed and updated Current Outpatient Prescriptions  Medication Sig Dispense Refill  . BIOTIN PO Take 1 capsule by mouth daily.     . Calcium Carbonate-Vitamin D (CALCIUM + D PO) Take 1 tablet by mouth daily.      [name] diltiazem (CARTIA XT) 240 MG 24 hr capsule Take 1 capsule (240 mg total) by mouth daily. 90 capsule 3  . escitalopram (LEXAPRO) 20 MG tablet TAKE 1 TABLET BY MOUTH EVERY DAY 90 tablet 2  . hydrochlorothiazide (HYDRODIURIL) 25 MG tablet Take 1 tablet (25 mg total) by mouth daily. 90 tablet 3  . irbesartan (AVAPRO) 300 MG tablet Take 1 tablet (300 mg total) by mouth daily. 90 tablet 3  . Multiple Vitamins-Minerals (ICAPS PO) Take 1 capsule by mouth daily.    . pantoprazole (PROTONIX) 40 MG tablet TAKE 1 TABLET BY MOUTH EVERY MORNING BEFORE BREAKFAST 90 tablet 0  . timolol (TIMOPTIC) 0.5 % ophthalmic solution Place 1 drop into both eyes every morning.     [name] ALPRAZolam (XANAX) 0.25 MG tablet TAKE 1 TABLET BY MOUTH EVERY DAY AS NEEDED (Patient not taking: Reported on 02/16/2015) 30 tablet 3   Objective: BP  130/80 mmHg  Pulse 77  Temp(Src) 98.3 F (36.8 C)  Wt 130 lb (58.968 kg)  LMP 08/12/1991 Gen: NAD, resting comfortably CV: RRR no murmurs rubs or gallops Lungs: CTAB no crackles, wheeze, rhonchi  Ext: no edema in hands or wrist  On right hand has 3 sutures in place on 1st phalanx of backside of hand, 5 on palmar surface 5th finger.   After suture removal, good wound healing noted on 5th finger. On 3rd finger, very mild separation. Patient mildly tender in both areas Skin: warm, dry  Neuro: 5/5 strength in fingers, hand and wrist, intact distal sensation  Assessment/Plan:  Extremity Laceration 11 days ago, returns for suture removal and wound reevaluation 5th finger healing well. 3rd finger appears to have had deeper laceration and taking longer to heal. Advised keep covered with activity but air dry when able. 8 sutures total removed corresponding to # placed.  Return precautions advised. Neurologically intact and good tendon function despite tendon being exposed at time of injury.

## 2015-02-19 ENCOUNTER — Encounter: Payer: Self-pay | Admitting: Gynecology

## 2015-02-19 ENCOUNTER — Ambulatory Visit (INDEPENDENT_AMBULATORY_CARE_PROVIDER_SITE_OTHER): Payer: Medicare Other | Admitting: Gynecology

## 2015-02-19 ENCOUNTER — Ambulatory Visit (INDEPENDENT_AMBULATORY_CARE_PROVIDER_SITE_OTHER): Payer: Medicare Other

## 2015-02-19 ENCOUNTER — Other Ambulatory Visit: Payer: Self-pay | Admitting: Gynecology

## 2015-02-19 VITALS — BP 120/70

## 2015-02-19 DIAGNOSIS — N83319 Acquired atrophy of ovary, unspecified side: Secondary | ICD-10-CM

## 2015-02-19 DIAGNOSIS — R1030 Lower abdominal pain, unspecified: Secondary | ICD-10-CM | POA: Diagnosis not present

## 2015-02-19 DIAGNOSIS — R102 Pelvic and perineal pain: Secondary | ICD-10-CM

## 2015-02-19 DIAGNOSIS — D251 Intramural leiomyoma of uterus: Secondary | ICD-10-CM | POA: Diagnosis not present

## 2015-02-19 DIAGNOSIS — N8331 Acquired atrophy of ovary: Secondary | ICD-10-CM

## 2015-02-19 NOTE — Progress Notes (Signed)
[name] 02/05/1941 [id number]        74 y.o.  [id number] presents for ultrasound. She had recently been seen for her annual breast and pelvic exam complaining of some pressure and discomfort in the bladder region.  Described as an ill-defined pressure type symptom. Has been going on for a long time.  Past medical history,surgical history, problem list, medications, allergies, family history and social history were all reviewed and documented in the EPIC chart.  Directed ROS with pertinent positives and negatives documented in the history of present illness/assessment and plan.  Exam: Filed Vitals:   02/19/15 1239  BP: 120/70   General appearance:  Normal  Ultrasound shows uterus normal size. Several small myomas measuring 15, 20, 18, 14 mm. Endometrial echo 5.5 mm. Right ovary normal/atrophic. Left ovary not visualized with no abnormalities in the adnexa. Cul-de-sac negative.  Assessment/Plan:  74 y.o. [id number] with ill-defined suprapubic discomfort. Has been going on for "years". Does have a normal CT scan in her record from 08/2013. Recent urine analysis is negative. Our ultrasound is negative excepting several small myomas. Do not think it is of GYN etiology. CT scan did show some mild diverticulitis but this seems to be more suprapubic. Offered urology referral but patient declined at this time stating she wants to monitor it and if it would change, worsen or she changes her mind she will call for urologic evaluation.    Anastasio Auerbach MD, 12:53 PM 02/19/2015

## 2015-02-19 NOTE — Patient Instructions (Signed)
Call if the discomfort worsens or changes. Call if you want referral to urology for further evaluation.

## 2015-04-26 ENCOUNTER — Telehealth: Payer: Self-pay | Admitting: Family Medicine

## 2015-04-26 NOTE — Telephone Encounter (Signed)
Pt notified and states she will make an OV if not better soon.

## 2015-04-26 NOTE — Telephone Encounter (Signed)
Can take dextromethorphan for cough. I do not call in antibiotics without a visit/evaluation if that is what she is requesting

## 2015-04-26 NOTE — Telephone Encounter (Signed)
Pt does not feel like come in. Pt is having head congestion , throat dryness  that is causing her to cough. Pt does not have a fever. Pt would like something call into [facility]

## 2015-04-26 NOTE — Telephone Encounter (Signed)
See below

## 2015-04-30 ENCOUNTER — Encounter: Payer: Self-pay | Admitting: Family Medicine

## 2015-04-30 ENCOUNTER — Ambulatory Visit (INDEPENDENT_AMBULATORY_CARE_PROVIDER_SITE_OTHER)
Admission: RE | Admit: 2015-04-30 | Discharge: 2015-04-30 | Disposition: A | Discharge status: Home / Self Care | Payer: Medicare Other | Source: Ambulatory Visit | Attending: Family Medicine | Admitting: Family Medicine

## 2015-04-30 ENCOUNTER — Ambulatory Visit (INDEPENDENT_AMBULATORY_CARE_PROVIDER_SITE_OTHER): Payer: Medicare Other | Admitting: Family Medicine

## 2015-04-30 VITALS — BP 126/90 | HR 89 | Wt 126.0 lb

## 2015-04-30 DIAGNOSIS — R0602 Shortness of breath: Secondary | ICD-10-CM | POA: Diagnosis not present

## 2015-04-30 DIAGNOSIS — B9689 Other specified bacterial agents as the cause of diseases classified elsewhere: Secondary | ICD-10-CM

## 2015-04-30 DIAGNOSIS — J329 Chronic sinusitis, unspecified: Secondary | ICD-10-CM

## 2015-04-30 DIAGNOSIS — A499 Bacterial infection, unspecified: Secondary | ICD-10-CM

## 2015-04-30 MED ORDER — AMOXICILLIN-POT CLAVULANATE 875-125 MG PO TABS: 1.0000 | ORAL_TABLET | Freq: Two times a day (BID) | ORAL | Status: DC

## 2015-04-30 NOTE — Progress Notes (Signed)
Garret Reddish, MD  Subjective:  [name] is a 74 y.o. year old very pleasant female patient who presents with:  Sinus pressure and congestion -Day 8 of illness. No sick contacts other than airplane. Was improving then seemed to worsen. Has not tried any therapy other than Tylenol for headaches.HA, sinus pressure and congestion, upper chest discomfort with coughing. Shortness of breath, very fatigued. Subjective fevers and chills  ROS- no nausea or vomiting. Some increased fatigue. no wheeze  Past Medical History-  Patient Active Problem List   Diagnosis Date Noted  . SVT (supraventricular tachycardia) 08/14/2011    Priority: High  . IBS (irritable bowel syndrome)- diarrhea predominant 01/26/2013    Priority: Medium  . Hyperlipidemia 09/22/2007    Priority: Medium  . Depression 03/11/2007    Priority: Medium  . Essential hypertension 03/11/2007    Priority: Medium  . GERD (gastroesophageal reflux disease) 07/14/2014    Priority: Low  . Personal history of colonic polyps 11/28/2011    Priority: Low  . 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 Cervical disc disorder with radiculopathy of cervical region 11/05/2009    Priority: Low  . Rosacea 03/11/2007    Priority: Low   Medications- reviewed and updated Current Outpatient Prescriptions  Medication Sig Dispense Refill  . BIOTIN PO Take 1 capsule by mouth daily.     . Calcium Carbonate-Vitamin D (CALCIUM + D PO) Take 1 tablet by mouth daily.      [name] diltiazem (CARTIA XT) 240 MG 24 hr capsule Take 1 capsule (240 mg total) by mouth daily. 90 capsule 3  . escitalopram (LEXAPRO) 20 MG tablet TAKE 1 TABLET BY MOUTH EVERY DAY 90 tablet 2  . hydrochlorothiazide (HYDRODIURIL) 25 MG tablet Take 1 tablet (25 mg total) by mouth daily. 90 tablet 3  . irbesartan (AVAPRO) 300 MG tablet Take 1 tablet (300 mg total) by mouth daily. 90 tablet 3  . Multiple Vitamins-Minerals (ICAPS PO) Take 1 capsule by mouth  daily.    . pantoprazole (PROTONIX) 40 MG tablet TAKE 1 TABLET BY MOUTH EVERY MORNING BEFORE BREAKFAST 90 tablet 0  . timolol (TIMOPTIC) 0.5 % ophthalmic solution Place 1 drop into both eyes every morning.     [name] ALPRAZolam (XANAX) 0.25 MG tablet TAKE 1 TABLET BY MOUTH EVERY DAY AS NEEDED (Patient not taking: Reported on 04/30/2015) 30 tablet 3  . amoxicillin-clavulanate (AUGMENTIN) 875-125 MG per tablet Take 1 tablet by mouth 2 (two) times daily. 14 tablet 0   No current facility-administered medications for this visit.    Objective: BP 126/90 mmHg  Pulse 89  Wt 126 lb (57.153 kg)  SpO2 95%  LMP 08/12/1991 Gen: NAD, resting comfortably Tympanic membranes normal. Oropharynx largely. Does have some yellow discharge bilateral nares. No frontal sinus tenderness. maxillary sinus tenderness noted bilaterally CV: RRR no murmurs rubs or gallops Lungs: CTAB no crackles, wheeze, rhonchi Abdomen: soft/nontender/nondistended/normal bowel sounds. No rebound or guarding.  Ext: no edema Skin: warm, dry Neuro: grossly normal, moves all extremities  Dg Chest 2 View  04/30/2015   CLINICAL DATA:  Cough, congestion and shortness of breath for 1 week.  EXAM: CHEST  2 VIEW  COMPARISON:  PA and lateral chest 08/07/2011.  FINDINGS: The lungs are clear. Heart size is normal. No pneumothorax or pleural effusion. Thoracic spondylosis is noted.  IMPRESSION: No acute disease.   Electronically Signed   By: Inge Rise M.D.   On: 04/30/2015 11:30  Assessment/Plan:  Bacterial sinusitis  given shortness of breath despite a normal lung exam patient wanted to make sure this was not pneumonia. We discussed it was low likelihood.She opted to proceed with x-ray which came back negative for pneumonia fortunately. She did have maxillary sinus tenderness and yellow discharge in bilateral and she has had double sickening therefore she qualifies for diagnosis of bacterial sinusitis. We will treat with 7 days of Augmentin.  Gave strict return precautions Patient will follow-up if she does not improve within the 7 days of antibiotics  Meds ordered this encounter  Medications  . amoxicillin-clavulanate (AUGMENTIN) 875-125 MG per tablet    Sig: Take 1 tablet by mouth 2 (two) times daily.    Dispense:  14 tablet    Refill:  0

## 2015-04-30 NOTE — Patient Instructions (Signed)
Go get your x-ray  By this evening I will give you a call about results. We will send in medicine for sinusitis if negative, for pneumonia if positive

## 2015-05-24 ENCOUNTER — Other Ambulatory Visit: Payer: Self-pay | Admitting: Internal Medicine

## 2015-05-26 ENCOUNTER — Other Ambulatory Visit: Payer: Self-pay | Admitting: Family Medicine

## 2015-07-09 ENCOUNTER — Other Ambulatory Visit: Payer: Self-pay | Admitting: Family Medicine

## 2015-07-09 NOTE — Telephone Encounter (Signed)
Yes thanks 

## 2015-07-09 NOTE — Telephone Encounter (Signed)
Refill ok? 

## 2015-08-01 ENCOUNTER — Other Ambulatory Visit: Payer: Self-pay | Admitting: Family Medicine

## 2015-08-29 ENCOUNTER — Other Ambulatory Visit: Payer: Self-pay | Admitting: Internal Medicine

## 2015-08-29 ENCOUNTER — Other Ambulatory Visit: Payer: Self-pay | Admitting: Family Medicine

## 2015-09-03 ENCOUNTER — Other Ambulatory Visit: Payer: Self-pay | Admitting: Family Medicine

## 2015-09-04 ENCOUNTER — Telehealth: Payer: Self-pay | Admitting: Internal Medicine

## 2015-09-04 NOTE — Telephone Encounter (Signed)
Patient with lower abdominal pressure for about 2 weeks.  She reports her pain is bilateral and worse with ambulation.  Denies fever, diarrhea, constipation or other complaints.  She is rescheduled to see Dr. Carlean Purl tomorrow at 1:45

## 2015-09-04 NOTE — Telephone Encounter (Signed)
Left message for patient to call back She is scheduled for Arta Bruce, PA for 09/06/15 2:45

## 2015-09-05 ENCOUNTER — Encounter: Payer: Self-pay | Admitting: Internal Medicine

## 2015-09-05 ENCOUNTER — Ambulatory Visit (INDEPENDENT_AMBULATORY_CARE_PROVIDER_SITE_OTHER): Payer: Medicare Other | Admitting: Internal Medicine

## 2015-09-05 VITALS — BP 136/80 | HR 76 | Ht 59.75 in | Wt 131.0 lb

## 2015-09-05 DIAGNOSIS — N816 Rectocele: Secondary | ICD-10-CM

## 2015-09-05 DIAGNOSIS — K589 Irritable bowel syndrome without diarrhea: Secondary | ICD-10-CM

## 2015-09-05 NOTE — Patient Instructions (Addendum)
  Follow up with [name] as needed.  We have given you samples of Align. This puts good bacteria back into your colon. You should take 1 capsule by mouth once daily. If this works well for you, it can be purchased over the counter.    We will take your colon recall out of the system.   We are giving you information to read today on a rectocele.    I appreciate the opportunity to care for you. [name], MD, [facility]

## 2015-09-05 NOTE — Progress Notes (Signed)
   Subjective:    Patient ID: [name], female    DOB: January 10, 1941, 75 y.o.   MRN: [id number]  CC: abdominal pain HPI [name] is a 75 yo female who presents with lower abdominal pain that has worsened over the past couple of weeks. About two weeks ago, she describes an upper abdominal pain that is a stabbing that woke her up during the night. After 2 nights, that pain subsided. Nothing aggravated or alleviated the pain. She has known diverticulosis with prior diverticulitis and IBS. She tried to eat a bland diet, which helped slightly. Since the original pain, she has had a persistent pressure in the lower abdomen. The patient tried Tylenol without any relief. During a BM, she describes a pressure in her rectum that pushes towards her bladder. She denies any urinary symptoms and at the time of her last pelvic exam in July 2016, she did not have any noticeable prolapse. The patient has had increased stress in the past couple of months due to her daughter being diagnosed with stage 1 pancreatic cancer.   Medications, allergies, past medical history, past surgical history, family history and social history are reviewed and updated in the EMR.  Review of Systems  Constitutional: Negative for fever and unexpected weight change.  Gastrointestinal: Positive for abdominal pain and rectal pain (pressure with BM). Negative for nausea, vomiting, diarrhea, constipation, blood in stool and abdominal distention.  Genitourinary: Negative for dysuria and difficulty urinating.  All other systems reviewed and are negative.      Objective:   Physical Exam BP 136/80 mmHg  Pulse 76  Ht 4' 11.75" (1.518 m)  Wt 131 lb (59.421 kg)  BMI 25.79 kg/m2  LMP 08/12/1991  Constitutional: She is oriented to person, place, and time. She appears well-developed and well-nourished.  HENT:  Head: Normocephalic and atraumatic.  Eyes: No scleral icterus.  Neck: No tracheal deviation present.  Cardiovascular: Normal  rate, regular rhythm and normal heart sounds.   Abdominal: Soft. Bowel sounds are normal. She exhibits no distension and no mass. There is tenderness (lower abdominen). There is no rebound and no guarding.  Rectal exam: moderate rectocele  Neurological: She is alert and oriented to person, place, and time.  Skin: Skin is warm and dry.  Psychiatric: She has a normal mood and affect. Her behavior is normal. Judgment and thought content normal.         Assessment & Plan:  IBS (irritable bowel syndrome)  Rectocele  Increase in stress has probably led to an increase in IBS symptoms. As stress resolves, symptoms should as well. If pain persists, changes in quality/stabbing, she should call or return. RTC PRN   Information given about rectocele. 1 month of Align probiotic given to allow for easy BM. Return PRN    I have seen the patient with Foye Spurling PA-S, she served as a Education administrator for this encounter.  Gatha Mayer, MD, [facility]

## 2015-09-06 ENCOUNTER — Ambulatory Visit: Payer: No Typology Code available for payment source | Admitting: Physician Assistant

## 2015-09-19 ENCOUNTER — Telehealth: Payer: Self-pay | Admitting: Internal Medicine

## 2015-09-19 NOTE — Telephone Encounter (Signed)
Phone line is busy.  I will continue to try and reach the patient 

## 2015-09-20 NOTE — Telephone Encounter (Signed)
Patient reports continued lower abdominal pain.  Pain is unchanged since her last office visit 09/05/15.  She feels her symptoms are consistent with previous diverticulitis "attacks".  She also reports loose stool after "every meal".  She denies fever.  Lower abdomen in tender, symptoms are unchanged from last documented office visit exam.  Please advise

## 2015-09-20 NOTE — Telephone Encounter (Signed)
Left message for patient to call back  

## 2015-09-21 MED ORDER — CIPROFLOXACIN HCL 500 MG PO TABS: 500.0000 mg | ORAL_TABLET | Freq: Two times a day (BID) | ORAL | Status: DC

## 2015-09-21 MED ORDER — METRONIDAZOLE 500 MG PO TABS: 500.0000 mg | ORAL_TABLET | Freq: Two times a day (BID) | ORAL | Status: DC

## 2015-09-21 NOTE — Telephone Encounter (Signed)
Patient notified

## 2015-09-21 NOTE — Telephone Encounter (Signed)
cipro 500 mg bid x 10 d Metronidazole 500 mg bid  X 10 d

## 2015-10-03 ENCOUNTER — Other Ambulatory Visit: Payer: Self-pay | Admitting: Family Medicine

## 2015-10-04 ENCOUNTER — Observation Stay (HOSPITAL_COMMUNITY)
Admission: EM | Admit: 2015-10-04 | Discharge: 2015-10-06 | Disposition: A | Discharge status: Home / Self Care | Payer: Medicare Other | Attending: Internal Medicine | Admitting: Internal Medicine

## 2015-10-04 ENCOUNTER — Emergency Department (HOSPITAL_COMMUNITY): Payer: Medicare Other

## 2015-10-04 ENCOUNTER — Encounter (HOSPITAL_COMMUNITY): Payer: Self-pay | Admitting: *Deleted

## 2015-10-04 DIAGNOSIS — F329 Major depressive disorder, single episode, unspecified: Secondary | ICD-10-CM | POA: Insufficient documentation

## 2015-10-04 DIAGNOSIS — I129 Hypertensive chronic kidney disease with stage 1 through stage 4 chronic kidney disease, or unspecified chronic kidney disease: Secondary | ICD-10-CM | POA: Diagnosis not present

## 2015-10-04 DIAGNOSIS — K210 Gastro-esophageal reflux disease with esophagitis: Secondary | ICD-10-CM | POA: Insufficient documentation

## 2015-10-04 DIAGNOSIS — M8580 Other specified disorders of bone density and structure, unspecified site: Secondary | ICD-10-CM | POA: Insufficient documentation

## 2015-10-04 DIAGNOSIS — H4050X0 Glaucoma secondary to other eye disorders, unspecified eye, stage unspecified: Secondary | ICD-10-CM

## 2015-10-04 DIAGNOSIS — E1159 Type 2 diabetes mellitus with other circulatory complications: Secondary | ICD-10-CM | POA: Diagnosis present

## 2015-10-04 DIAGNOSIS — E785 Hyperlipidemia, unspecified: Secondary | ICD-10-CM | POA: Insufficient documentation

## 2015-10-04 DIAGNOSIS — J101 Influenza due to other identified influenza virus with other respiratory manifestations: Secondary | ICD-10-CM

## 2015-10-04 DIAGNOSIS — J209 Acute bronchitis, unspecified: Secondary | ICD-10-CM | POA: Diagnosis present

## 2015-10-04 DIAGNOSIS — Z8679 Personal history of other diseases of the circulatory system: Secondary | ICD-10-CM | POA: Diagnosis present

## 2015-10-04 DIAGNOSIS — H9190 Unspecified hearing loss, unspecified ear: Secondary | ICD-10-CM | POA: Insufficient documentation

## 2015-10-04 DIAGNOSIS — K5730 Diverticulosis of large intestine without perforation or abscess without bleeding: Secondary | ICD-10-CM | POA: Diagnosis not present

## 2015-10-04 DIAGNOSIS — I152 Hypertension secondary to endocrine disorders: Secondary | ICD-10-CM | POA: Diagnosis present

## 2015-10-04 DIAGNOSIS — F3341 Major depressive disorder, recurrent, in partial remission: Secondary | ICD-10-CM | POA: Diagnosis present

## 2015-10-04 DIAGNOSIS — N189 Chronic kidney disease, unspecified: Secondary | ICD-10-CM | POA: Insufficient documentation

## 2015-10-04 DIAGNOSIS — Z79899 Other long term (current) drug therapy: Secondary | ICD-10-CM | POA: Insufficient documentation

## 2015-10-04 DIAGNOSIS — Z872 Personal history of diseases of the skin and subcutaneous tissue: Secondary | ICD-10-CM | POA: Diagnosis not present

## 2015-10-04 DIAGNOSIS — R509 Fever, unspecified: Secondary | ICD-10-CM | POA: Diagnosis not present

## 2015-10-04 DIAGNOSIS — I471 Supraventricular tachycardia: Secondary | ICD-10-CM | POA: Diagnosis present

## 2015-10-04 DIAGNOSIS — K644 Residual hemorrhoidal skin tags: Secondary | ICD-10-CM | POA: Insufficient documentation

## 2015-10-04 DIAGNOSIS — N816 Rectocele: Secondary | ICD-10-CM | POA: Diagnosis not present

## 2015-10-04 DIAGNOSIS — Z86018 Personal history of other benign neoplasm: Secondary | ICD-10-CM | POA: Diagnosis not present

## 2015-10-04 DIAGNOSIS — I10 Essential (primary) hypertension: Secondary | ICD-10-CM | POA: Diagnosis present

## 2015-10-04 DIAGNOSIS — H409 Unspecified glaucoma: Secondary | ICD-10-CM | POA: Diagnosis not present

## 2015-10-04 DIAGNOSIS — N200 Calculus of kidney: Secondary | ICD-10-CM | POA: Insufficient documentation

## 2015-10-04 DIAGNOSIS — K219 Gastro-esophageal reflux disease without esophagitis: Secondary | ICD-10-CM | POA: Diagnosis present

## 2015-10-04 DIAGNOSIS — R0902 Hypoxemia: Secondary | ICD-10-CM | POA: Diagnosis not present

## 2015-10-04 DIAGNOSIS — F325 Major depressive disorder, single episode, in full remission: Secondary | ICD-10-CM | POA: Diagnosis present

## 2015-10-04 LAB — URINALYSIS, ROUTINE W REFLEX MICROSCOPIC
Bilirubin Urine: NEGATIVE
Glucose, UA: NEGATIVE mg/dL
Ketones, ur: NEGATIVE mg/dL
Leukocytes, UA: NEGATIVE
Nitrite: NEGATIVE
PROTEIN: NEGATIVE mg/dL
Specific Gravity, Urine: 1.012 (ref 1.005–1.030)
pH: 6 (ref 5.0–8.0)

## 2015-10-04 LAB — CBC WITH DIFFERENTIAL/PLATELET
BASOS ABS: 0 10*3/uL (ref 0.0–0.1)
Basophils Relative: 0 %
EOS ABS: 0 10*3/uL (ref 0.0–0.7)
EOS PCT: 0 %
HCT: 36.8 % (ref 36.0–46.0)
Hemoglobin: 12.1 g/dL (ref 12.0–15.0)
Lymphocytes Relative: 15 %
Lymphs Abs: 1 10*3/uL (ref 0.7–4.0)
MCH: 31.3 pg (ref 26.0–34.0)
MCHC: 32.9 g/dL (ref 30.0–36.0)
MCV: 95.1 fL (ref 78.0–100.0)
Monocytes Absolute: 0.7 10*3/uL (ref 0.1–1.0)
Monocytes Relative: 11 %
Neutro Abs: 4.9 10*3/uL (ref 1.7–7.7)
Neutrophils Relative %: 74 %
PLATELETS: 219 10*3/uL (ref 150–400)
RBC: 3.87 MIL/uL (ref 3.87–5.11)
RDW: 12.9 % (ref 11.5–15.5)
WBC: 6.6 10*3/uL (ref 4.0–10.5)

## 2015-10-04 LAB — I-STAT CG4 LACTIC ACID, ED: LACTIC ACID, VENOUS: 1.18 mmol/L (ref 0.5–2.0)

## 2015-10-04 LAB — COMPREHENSIVE METABOLIC PANEL
ALT: 18 U/L (ref 14–54)
ANION GAP: 9 (ref 5–15)
AST: 21 U/L (ref 15–41)
Albumin: 3.6 g/dL (ref 3.5–5.0)
Alkaline Phosphatase: 45 U/L (ref 38–126)
BUN: 13 mg/dL (ref 6–20)
CO2: 27 mmol/L (ref 22–32)
Calcium: 8.3 mg/dL — ABNORMAL LOW (ref 8.9–10.3)
Chloride: 105 mmol/L (ref 101–111)
Creatinine, Ser: 0.7 mg/dL (ref 0.44–1.00)
Glucose, Bld: 109 mg/dL — ABNORMAL HIGH (ref 65–99)
POTASSIUM: 3.7 mmol/L (ref 3.5–5.1)
Sodium: 141 mmol/L (ref 135–145)
TOTAL PROTEIN: 6.1 g/dL — AB (ref 6.5–8.1)
Total Bilirubin: 0.5 mg/dL (ref 0.3–1.2)

## 2015-10-04 LAB — URINE MICROSCOPIC-ADD ON

## 2015-10-04 MED ORDER — ACETAMINOPHEN 325 MG PO TABS
650.0000 mg | ORAL_TABLET | Freq: Once | ORAL | Status: AC | PRN
  Administered 2015-10-04: 650 mg via ORAL
  Filled 2015-10-04: qty 2

## 2015-10-04 MED ORDER — DEXTROSE 5 % IV SOLN
1.0000 g | Freq: Once | INTRAVENOUS | Status: AC
  Administered 2015-10-04: 1 g via INTRAVENOUS
  Filled 2015-10-04: qty 10

## 2015-10-04 MED ORDER — IPRATROPIUM-ALBUTEROL 0.5-2.5 (3) MG/3ML IN SOLN
3.0000 mL | Freq: Once | RESPIRATORY_TRACT | Status: AC
  Administered 2015-10-04: 3 mL via RESPIRATORY_TRACT
  Filled 2015-10-04: qty 3

## 2015-10-04 MED ORDER — DEXTROSE 5 % IV SOLN
500.0000 mg | Freq: Once | INTRAVENOUS | Status: AC
  Administered 2015-10-05: 500 mg via INTRAVENOUS
  Filled 2015-10-04: qty 500

## 2015-10-04 NOTE — ED Notes (Signed)
Pt reports she has been feeling bad all day today.  Was trying to get OOB but was too weak.  She reports she started to freak out so she rolled off the bed and was not able to get up so she called 911.  Pt reports nausea and generalized body aches.  Pt reports she went to visit her sister at a nsg home last week.  Pt also has non-productive cough.

## 2015-10-04 NOTE — ED Notes (Signed)
Bed: WA24 Expected date:  Expected time:  Means of arrival:  Comments: EMS-fever 

## 2015-10-04 NOTE — ED Provider Notes (Signed)
CSN: [id number]     Arrival date & time 10/04/15  1844 History   First MD Initiated Contact with Patient 10/04/15 1914     Chief Complaint  Patient presents with  . Fever  . Nausea     (Consider location/radiation/quality/duration/timing/severity/associated sxs/prior Treatment) HPI Patient presents to the Emergency Department complaining of weakness, nausea, and body aches. She began feeling weak, nauseous, and achey yesterday afternoon and patient states that she went to sleep and slept all day. She woke up this morning complaining of a cough and a sore throat. Her cough is non-productive. She has not been able to take any of her medications or any fluids. She was trying to drink water when she states she was so weak that she slid out of bed onto the floor. She was afraid she would not be able to get back up so she called 911. She did not hit anything and eventually was able to get up on her own. Patient endorses fever, chills, sweats, lightheadedness, headache, lower back pain, myalgias, and soreness. She denies diarrhea, constipation, chest pain, SOB, syncope, rash, dysuria, vision changes, rhinorrhea. She endorses sick contacts with the flu.  Past Medical History  Diagnosis Date  . HYPERLIPIDEMIA 09/22/2007  . DEPRESSION 03/11/2007  . 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 CERUMEN IMPACTION, BILATERAL 12/13/2007  . HYPERTENSION 03/11/2007  . DIVERTICULOSIS, COLON 03/11/2007  . Rosacea 03/11/2007  . CERVICAL RADICULOPATHY 11/05/2009  . Arthritis   . Chronic kidney disease     kidney stones  . Kidney stone   . Adenomatous colon polyp 2006  . Tachycardia   . Anxiety   . GERD (gastroesophageal reflux disease) 10/14/2012  . Esophagitis, reflux 10/14/2012  . Benign esophageal stricture 10/14/2012  . Osteopenia 02/2015    T score -1.4 FRAX 7.3%/0.6%  . Rectocele   . HEMORRHOIDS, EXTERNAL W/O COMPLICATION [id number]    Qualifier: Diagnosis of  By: Jimmye Norman, LPN, [place] Cunas   .  Glaucoma   . Hearing loss    Past Surgical History  Procedure Laterality Date  . Kidney surgery      age 10  . Cervical fusion      C4-6  . Tonsillectomy    . Colonoscopy      multiple  . Anterior cervical decomp/discectomy fusion  08/14/2011    Procedure: ANTERIOR CERVICAL DECOMPRESSION/DISCECTOMY FUSION 1 LEVEL/HARDWARE REMOVAL;  Surgeon: [name], MD;  Location: [facility];  Service: Neurosurgery;  Laterality: N/A;  Cervical Six-Seven anterior cervical decompression with fusion interbody prothesis plating and bonegraft with removal of hardware of Cervical Four to Cervical Six   . Esophagogastroduodenoscopy  10/14/2012    Dr. [name]   Family History  Problem Relation Age of Onset  . Mental illness Mother     comitted suicide  . Glaucoma Mother   . Lung cancer Father     smoker  . Hypertension Father   . Heart disease Father     age 60, smoker  . Cerebral palsy Sister   . Stroke Sister     multiple  . Diabetes Maternal Grandmother   . Colon cancer Neg Hx   . Stomach cancer Neg Hx   . Stroke Paternal Aunt   . Stroke Paternal Uncle   . Stroke Paternal Uncle   . Stroke Paternal Uncle   . Stroke Paternal Aunt   . Pancreatic cancer Daughter    Social History  Substance Use Topics  . Smoking status: Never Smoker   .  Smokeless tobacco: Never Used  . Alcohol Use: No   OB History    Gravida Para Term Preterm AB TAB SAB Ectopic Multiple Living   5 5 5       5      Review of Systems  All other systems negative except as documented in the HPI. All pertinent positives and negatives as reviewed in the HPI.   Allergies  Codeine  Home Medications   Prior to Admission medications   Medication Sig Start Date End Date Taking? Authorizing Provider  ALPRAZolam Duanne Moron) 0.25 MG tablet TAKE 1 TABLET BY MOUTH EVERY DAY AS NEEDED 07/09/15   [name], MD  BIOTIN PO Take 1 capsule by mouth daily.     Historical Provider, MD  Calcium Carbonate-Vitamin D (CALCIUM  + D PO) Take 1 tablet by mouth daily.      Historical Provider, MD  CARTIA XT 240 MG 24 hr capsule TAKE 1 CAPSULE BY MOUTH DAILY 09/03/15   [name], MD  ciprofloxacin (CIPRO) 500 MG tablet Take 1 tablet (500 mg total) by mouth 2 (two) times daily. 09/21/15   Gatha Mayer, MD  escitalopram (LEXAPRO) 20 MG tablet TAKE 1 TABLET BY MOUTH EVERY DAY 08/01/15   [name], MD  hydrochlorothiazide (HYDRODIURIL) 25 MG tablet TAKE 1 TABLET BY MOUTH DAILY 08/29/15   [name], MD  irbesartan (AVAPRO) 300 MG tablet TAKE 1 TABLET BY MOUTH EVERY DAY 10/03/15   [name], MD  metroNIDAZOLE (FLAGYL) 500 MG tablet Take 1 tablet (500 mg total) by mouth 2 (two) times daily. 09/21/15   Gatha Mayer, MD  Multiple Vitamins-Minerals (ICAPS PO) Take 1 capsule by mouth daily.    Historical Provider, MD  pantoprazole (PROTONIX) 40 MG tablet TAKE 1 TABLET BY MOUTH EVERY MORNING BEFORE BREAKFAST 08/29/15   Gatha Mayer, MD  timolol (TIMOPTIC) 0.5 % ophthalmic solution Place 1 drop into both eyes every morning.  10/08/12   Historical Provider, MD   BP 140/78 mmHg  Pulse 117  Temp(Src) 102.3 F (39.1 C) (Oral)  Resp 18  SpO2 100%  LMP 08/12/1991 Physical Exam  Constitutional: She is oriented to person, place, and time. She appears well-developed and well-nourished. No distress.  HENT:  Head: Normocephalic and atraumatic.  Right Ear: External ear normal.  Left Ear: External ear normal.  Oropharynx is dry  Eyes: Conjunctivae and EOM are normal. Pupils are equal, round, and reactive to light.  Neck: Normal range of motion. Neck supple.  Cardiovascular: Regular rhythm, normal heart sounds and intact distal pulses.  Exam reveals no friction rub.   No murmur heard. Tachycardic  Pulmonary/Chest: Effort normal. No respiratory distress. She has no wheezes. She has rales.  Abdominal: Soft. Bowel sounds are normal. She exhibits no distension. There is no tenderness. There is no rebound and no  guarding.  Musculoskeletal: Normal range of motion. She exhibits no edema or tenderness.  Lymphadenopathy:    She has no cervical adenopathy.  Neurological: She is alert and oriented to person, place, and time.  Skin: Skin is warm and dry. She is not diaphoretic.  Psychiatric: She has a normal mood and affect. Her behavior is normal. Thought content normal.    ED Course  Procedures (including critical care time) Labs Review Labs Reviewed  CULTURE, BLOOD (ROUTINE X 2)  CULTURE, BLOOD (ROUTINE X 2)  URINE CULTURE  COMPREHENSIVE METABOLIC PANEL  URINALYSIS, ROUTINE W REFLEX MICROSCOPIC (NOT AT [facility])  CBC WITH DIFFERENTIAL/PLATELET  I-STAT CG4 LACTIC ACID, ED    Imaging Review No results found. I have personally reviewed and evaluated these images and lab results as part of my medical decision-making.   EKG Interpretation None      MDM   Final diagnoses:  None    Patient will be admitted to the hospital for further evaluation of her hypoxia and febrile illness.  This most likely is influenza versus community-acquired pneumonia.  Spoke with the [facility] who will evaluate the patient    Dalia Heading, PA-C 10/05/15 0139  Varney Biles, MD 10/05/15 [phone]

## 2015-10-04 NOTE — ED Notes (Signed)
Per EMS- Patient is from home/ patient reports that she has been around people with flu symptoms. Patient c/o fever, nausea, and body aches. Patient also c/o feeling unsteady and fell today. Patient got herself off of the floor prior to EMS arrival. Patient denies vomiting or diarrhea.

## 2015-10-05 ENCOUNTER — Encounter (HOSPITAL_COMMUNITY): Payer: Self-pay | Admitting: Internal Medicine

## 2015-10-05 DIAGNOSIS — I10 Essential (primary) hypertension: Secondary | ICD-10-CM

## 2015-10-05 DIAGNOSIS — R509 Fever, unspecified: Secondary | ICD-10-CM | POA: Diagnosis not present

## 2015-10-05 DIAGNOSIS — J209 Acute bronchitis, unspecified: Secondary | ICD-10-CM | POA: Diagnosis not present

## 2015-10-05 DIAGNOSIS — J101 Influenza due to other identified influenza virus with other respiratory manifestations: Secondary | ICD-10-CM

## 2015-10-05 LAB — CBC WITH DIFFERENTIAL/PLATELET
Basophils Absolute: 0 10*3/uL (ref 0.0–0.1)
Basophils Relative: 0 %
EOS ABS: 0 10*3/uL (ref 0.0–0.7)
EOS PCT: 0 %
HCT: 35.9 % — ABNORMAL LOW (ref 36.0–46.0)
Hemoglobin: 11.5 g/dL — ABNORMAL LOW (ref 12.0–15.0)
LYMPHS ABS: 1.8 10*3/uL (ref 0.7–4.0)
Lymphocytes Relative: 30 %
MCH: 30.8 pg (ref 26.0–34.0)
MCHC: 32 g/dL (ref 30.0–36.0)
MCV: 96.2 fL (ref 78.0–100.0)
MONO ABS: 0.4 10*3/uL (ref 0.1–1.0)
MONOS PCT: 6 %
Neutro Abs: 4 10*3/uL (ref 1.7–7.7)
Neutrophils Relative %: 64 %
PLATELETS: 191 10*3/uL (ref 150–400)
RBC: 3.73 MIL/uL — ABNORMAL LOW (ref 3.87–5.11)
RDW: 13 % (ref 11.5–15.5)
WBC: 6.2 10*3/uL (ref 4.0–10.5)

## 2015-10-05 LAB — COMPREHENSIVE METABOLIC PANEL
ALT: 18 U/L (ref 14–54)
ANION GAP: 10 (ref 5–15)
AST: 20 U/L (ref 15–41)
Albumin: 3.3 g/dL — ABNORMAL LOW (ref 3.5–5.0)
Alkaline Phosphatase: 40 U/L (ref 38–126)
BUN: 11 mg/dL (ref 6–20)
CHLORIDE: 105 mmol/L (ref 101–111)
CO2: 25 mmol/L (ref 22–32)
Calcium: 7.9 mg/dL — ABNORMAL LOW (ref 8.9–10.3)
Creatinine, Ser: 0.67 mg/dL (ref 0.44–1.00)
GFR calc non Af Amer: >60 mL/min (ref >60)
Glucose, Bld: 122 mg/dL — ABNORMAL HIGH (ref 65–99)
Potassium: 3.6 mmol/L (ref 3.5–5.1)
SODIUM: 140 mmol/L (ref 135–145)
Total Bilirubin: 0.7 mg/dL (ref 0.3–1.2)
Total Protein: 5.6 g/dL — ABNORMAL LOW (ref 6.5–8.1)

## 2015-10-05 LAB — MAGNESIUM: Magnesium: 1.4 mg/dL — ABNORMAL LOW (ref 1.7–2.4)

## 2015-10-05 LAB — INFLUENZA PANEL BY PCR (TYPE A & B)
H1N1 flu by pcr: NOT DETECTED
Influenza A By PCR: POSITIVE — AB
Influenza B By PCR: NEGATIVE

## 2015-10-05 LAB — PHOSPHORUS: Phosphorus: 2.8 mg/dL (ref 2.5–4.6)

## 2015-10-05 MED ORDER — ENOXAPARIN SODIUM 40 MG/0.4ML ~~LOC~~ SOLN
40.0000 mg | Freq: Every day | SUBCUTANEOUS | Status: DC
  Administered 2015-10-05 – 2015-10-06 (×2): 40 mg via SUBCUTANEOUS
  Filled 2015-10-05 (×2): qty 0.4

## 2015-10-05 MED ORDER — MAGNESIUM SULFATE 2 GM/50ML IV SOLN
2.0000 g | Freq: Once | INTRAVENOUS | Status: AC
  Administered 2015-10-05: 2 g via INTRAVENOUS
  Filled 2015-10-05: qty 50

## 2015-10-05 MED ORDER — ONDANSETRON HCL 4 MG PO TABS: 4.0000 mg | ORAL_TABLET | Freq: Four times a day (QID) | ORAL | Status: DC | PRN

## 2015-10-05 MED ORDER — DEXTROSE 5 % IV SOLN
1.0000 g | INTRAVENOUS | Status: DC
  Filled 2015-10-05: qty 10

## 2015-10-05 MED ORDER — OSELTAMIVIR PHOSPHATE 30 MG PO CAPS
30.0000 mg | ORAL_CAPSULE | Freq: Two times a day (BID) | ORAL | Status: DC
  Administered 2015-10-05 – 2015-10-06 (×3): 30 mg via ORAL
  Filled 2015-10-05 (×5): qty 1

## 2015-10-05 MED ORDER — ONDANSETRON HCL 4 MG/2ML IJ SOLN: 4.0000 mg | Freq: Four times a day (QID) | INTRAMUSCULAR | Status: DC | PRN

## 2015-10-05 MED ORDER — ACETAMINOPHEN 325 MG PO TABS
650.0000 mg | ORAL_TABLET | Freq: Four times a day (QID) | ORAL | Status: DC | PRN
  Administered 2015-10-06: 650 mg via ORAL
  Filled 2015-10-05: qty 2

## 2015-10-05 MED ORDER — ALBUTEROL SULFATE (2.5 MG/3ML) 0.083% IN NEBU
2.5000 mg | INHALATION_SOLUTION | Freq: Four times a day (QID) | RESPIRATORY_TRACT | Status: DC
  Administered 2015-10-05: 2.5 mg via RESPIRATORY_TRACT
  Filled 2015-10-05: qty 3

## 2015-10-05 MED ORDER — ESCITALOPRAM OXALATE 10 MG PO TABS
20.0000 mg | ORAL_TABLET | Freq: Every day | ORAL | Status: DC
  Administered 2015-10-05 – 2015-10-06 (×2): 20 mg via ORAL
  Filled 2015-10-05 (×2): qty 2

## 2015-10-05 MED ORDER — GUAIFENESIN-DM 100-10 MG/5ML PO SYRP: 5.0000 mL | ORAL_SOLUTION | ORAL | Status: DC | PRN

## 2015-10-05 MED ORDER — ALBUTEROL SULFATE (2.5 MG/3ML) 0.083% IN NEBU: 2.5000 mg | INHALATION_SOLUTION | RESPIRATORY_TRACT | Status: DC | PRN

## 2015-10-05 MED ORDER — DM-GUAIFENESIN ER 30-600 MG PO TB12
1.0000 | ORAL_TABLET | Freq: Two times a day (BID) | ORAL | Status: DC
Start: 2015-10-05 — End: 2015-10-06
  Administered 2015-10-05 – 2015-10-06 (×3): 1 via ORAL
  Filled 2015-10-05 (×4): qty 1

## 2015-10-05 MED ORDER — CETYLPYRIDINIUM CHLORIDE 0.05 % MT LIQD
7.0000 mL | Freq: Two times a day (BID) | OROMUCOSAL | Status: DC
  Administered 2015-10-05 – 2015-10-06 (×2): 7 mL via OROMUCOSAL

## 2015-10-05 MED ORDER — IPRATROPIUM-ALBUTEROL 0.5-2.5 (3) MG/3ML IN SOLN: 3.0000 mL | Freq: Four times a day (QID) | RESPIRATORY_TRACT | Status: DC | PRN

## 2015-10-05 MED ORDER — POTASSIUM CHLORIDE IN NACL 20-0.9 MEQ/L-% IV SOLN
INTRAVENOUS | Status: AC
  Administered 2015-10-05: 1000 mL via INTRAVENOUS
  Filled 2015-10-05 (×3): qty 1000

## 2015-10-05 MED ORDER — TIMOLOL MALEATE 0.5 % OP SOLN
1.0000 [drp] | Freq: Every day | OPHTHALMIC | Status: DC
  Administered 2015-10-05 – 2015-10-06 (×2): 1 [drp] via OPHTHALMIC
  Filled 2015-10-05: qty 5

## 2015-10-05 MED ORDER — PANTOPRAZOLE SODIUM 40 MG PO TBEC
40.0000 mg | DELAYED_RELEASE_TABLET | Freq: Every day | ORAL | Status: DC
  Administered 2015-10-05 – 2015-10-06 (×2): 40 mg via ORAL
  Filled 2015-10-05 (×2): qty 1

## 2015-10-05 MED ORDER — KETOROLAC TROMETHAMINE 15 MG/ML IJ SOLN
15.0000 mg | Freq: Once | INTRAMUSCULAR | Status: AC
  Administered 2015-10-05: 15 mg via INTRAVENOUS
  Filled 2015-10-05: qty 1

## 2015-10-05 MED ORDER — GUAIFENESIN ER 600 MG PO TB12
600.0000 mg | ORAL_TABLET | Freq: Two times a day (BID) | ORAL | Status: DC
  Administered 2015-10-05 (×2): 600 mg via ORAL
  Filled 2015-10-05 (×2): qty 1

## 2015-10-05 MED ORDER — ACETAMINOPHEN 325 MG PO TABS
650.0000 mg | ORAL_TABLET | Freq: Four times a day (QID) | ORAL | Status: AC | PRN
  Administered 2015-10-05: 650 mg via ORAL
  Filled 2015-10-05 (×2): qty 2

## 2015-10-05 MED ORDER — IPRATROPIUM BROMIDE 0.02 % IN SOLN
0.5000 mg | Freq: Four times a day (QID) | RESPIRATORY_TRACT | Status: DC
  Administered 2015-10-05: 0.5 mg via RESPIRATORY_TRACT
  Filled 2015-10-05: qty 2.5

## 2015-10-05 MED ORDER — DILTIAZEM HCL ER COATED BEADS 120 MG PO CP24
240.0000 mg | ORAL_CAPSULE | Freq: Every day | ORAL | Status: DC
  Administered 2015-10-05 – 2015-10-06 (×2): 240 mg via ORAL
  Filled 2015-10-05 (×2): qty 2

## 2015-10-05 MED ORDER — HYDROCHLOROTHIAZIDE 25 MG PO TABS
25.0000 mg | ORAL_TABLET | Freq: Every day | ORAL | Status: DC
  Administered 2015-10-05 – 2015-10-06 (×2): 25 mg via ORAL
  Filled 2015-10-05 (×2): qty 1

## 2015-10-05 MED ORDER — IRBESARTAN 150 MG PO TABS
300.0000 mg | ORAL_TABLET | Freq: Every day | ORAL | Status: DC
  Administered 2015-10-05 – 2015-10-06 (×2): 300 mg via ORAL
  Filled 2015-10-05 (×2): qty 2

## 2015-10-05 MED ORDER — IPRATROPIUM-ALBUTEROL 0.5-2.5 (3) MG/3ML IN SOLN
3.0000 mL | Freq: Three times a day (TID) | RESPIRATORY_TRACT | Status: DC
  Filled 2015-10-05: qty 3

## 2015-10-05 MED ORDER — ALPRAZOLAM 0.25 MG PO TABS
0.2500 mg | ORAL_TABLET | Freq: Two times a day (BID) | ORAL | Status: DC | PRN
  Administered 2015-10-05: 0.25 mg via ORAL
  Filled 2015-10-05: qty 1

## 2015-10-05 MED ORDER — METHYLPREDNISOLONE SODIUM SUCC 40 MG IJ SOLR
40.0000 mg | Freq: Once | INTRAMUSCULAR | Status: AC
  Administered 2015-10-05: 40 mg via INTRAVENOUS
  Filled 2015-10-05: qty 1

## 2015-10-05 MED ORDER — AZITHROMYCIN 250 MG PO TABS
500.0000 mg | ORAL_TABLET | Freq: Every day | ORAL | Status: DC
  Administered 2015-10-05: 500 mg via ORAL
  Filled 2015-10-05: qty 2

## 2015-10-05 NOTE — Progress Notes (Signed)
Called ED to obtain report. RN assigned to patient "busy" in another room. Secretary will have him call report when able.

## 2015-10-05 NOTE — H&P (Signed)
[facility] History and Physical  [name] [id number] DOB: 04/21/1941 DOA: 10/04/2015  Referring physician: Dalia Heading, PA-C PCP: Garret Reddish, MD   Chief Complaint: Fever and nausea.  HPI: [name] is a 75 y.o. female  with past medical history hyperlipidemia, glaucoma, depression, anxiety, urolithiasis, hypertension, GERD, esophageal stricture who comes to the emergency department complaining of weakness, fever, chills, fatigue, malaise, productive cough, headache, sore throat, dyspnea and wheezing for the past 2 days.  Per patient, she has been having symptoms since yesterday. She denies any travel history or sick contacts to her knowledge. She denies chest pain, palpitations, diaphoresis, pitting edema of the lower extremities, emesis, diarrhea or GU symptoms.  When seen in the emergency department, the patient was in no acute distress. She stated she was feeling better after bronchodilators, supplemental oxygen, IV fluids and IV antibiotics.  Review of Systems:  Constitutional:  Fevers, chills, fatigue.  No weight loss, night sweats HEENT:  Positive for Sore throat. No headaches, Difficulty swallowing,Tooth/dental problems, No sneezing, itching, ear ache, nasal congestion, post nasal drip,  Cardio-vascular:  No chest pain, Orthopnea, PND, swelling in lower extremities, anasarca, dizziness, palpitations  GI:  Positive for loss of appetite. No heartburn, indigestion, abdominal pain, nausea, vomiting, diarrhea, change in bowel habits.  Resp:  Positive for dyspnea, nonproductive cough, wheezing. No hemoptysis. Skin:  No rash or lesions.  GU:  no dysuria, change in color of urine, no urgency or frequency. No flank pain.  Musculoskeletal:  Positive myalgias.. No decreased range of motion. No back pain.  Psych:  No change in mood or affect. No depression or anxiety. No memory loss.   Past Medical History  Diagnosis Date  . HYPERLIPIDEMIA  09/22/2007  . DEPRESSION 03/11/2007  . 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 CERUMEN IMPACTION, BILATERAL 12/13/2007  . HYPERTENSION 03/11/2007  . DIVERTICULOSIS, COLON 03/11/2007  . Rosacea 03/11/2007  . CERVICAL RADICULOPATHY 11/05/2009  . Arthritis   . Chronic kidney disease     kidney stones  . Kidney stone   . Adenomatous colon polyp 2006  . Tachycardia   . Anxiety   . GERD (gastroesophageal reflux disease) 10/14/2012  . Esophagitis, reflux 10/14/2012  . Benign esophageal stricture 10/14/2012  . Osteopenia 02/2015    T score -1.4 FRAX 7.3%/0.6%  . Rectocele   . HEMORRHOIDS, EXTERNAL W/O COMPLICATION [id number]    Qualifier: Diagnosis of  By: Jimmye Norman, LPN, [place] Cunas   . Glaucoma   . Hearing loss    Past Surgical History  Procedure Laterality Date  . Kidney surgery      age 75  . Cervical fusion      C4-6  . Tonsillectomy    . Colonoscopy      multiple  . Anterior cervical decomp/discectomy fusion  08/14/2011    Procedure: ANTERIOR CERVICAL DECOMPRESSION/DISCECTOMY FUSION 1 LEVEL/HARDWARE REMOVAL;  Surgeon: [name], MD;  Location: [facility];  Service: Neurosurgery;  Laterality: N/A;  Cervical Six-Seven anterior cervical decompression with fusion interbody prothesis plating and bonegraft with removal of hardware of Cervical Four to Cervical Six   . Esophagogastroduodenoscopy  10/14/2012    Dr. [name]   Social History:  reports that she has never smoked. She has never used smokeless tobacco. She reports that she does not drink alcohol or use illicit drugs.  Allergies  Allergen Reactions  . Codeine Nausea Only    Family History  Problem Relation Age of Onset  . Mental illness Mother  comitted suicide  . Glaucoma Mother   . Lung cancer Father     smoker  . Hypertension Father   . Heart disease Father     age 68, smoker  . Cerebral palsy Sister   . Stroke Sister     multiple  . Diabetes Maternal Grandmother   . Colon cancer Neg Hx   .  Stomach cancer Neg Hx   . Stroke Paternal Aunt   . Stroke Paternal Uncle   . Stroke Paternal Uncle   . Stroke Paternal Uncle   . Stroke Paternal Aunt   . Pancreatic cancer Daughter     Prior to Admission medications   Medication Sig Start Date End Date Taking? Authorizing Provider  acetaminophen (TYLENOL) 325 MG tablet Take 650 mg by mouth every 6 (six) hours as needed for moderate pain.   Yes Historical Provider, MD  ALPRAZolam (XANAX) 0.25 MG tablet TAKE 1 TABLET BY MOUTH EVERY DAY AS NEEDED Patient taking differently: TAKE 1 TABLET BY MOUTH EVERY DAY AS  FOR ANXIETY 07/09/15  Yes [name], MD  CARTIA XT 240 MG 24 hr capsule TAKE 1 CAPSULE BY MOUTH DAILY 09/03/15  Yes [name], MD  BIOTIN PO Take 1 capsule by mouth daily.     Historical Provider, MD  Calcium Carbonate-Vitamin D (CALCIUM + D PO) Take 1 tablet by mouth daily.      Historical Provider, MD  ciprofloxacin (CIPRO) 500 MG tablet Take 1 tablet (500 mg total) by mouth 2 (two) times daily. 09/21/15   Gatha Mayer, MD  escitalopram (LEXAPRO) 20 MG tablet TAKE 1 TABLET BY MOUTH EVERY DAY 08/01/15   [name], MD  hydrochlorothiazide (HYDRODIURIL) 25 MG tablet TAKE 1 TABLET BY MOUTH DAILY 08/29/15   [name], MD  irbesartan (AVAPRO) 300 MG tablet TAKE 1 TABLET BY MOUTH EVERY DAY 10/03/15   [name], MD  metroNIDAZOLE (FLAGYL) 500 MG tablet Take 1 tablet (500 mg total) by mouth 2 (two) times daily. 09/21/15   Gatha Mayer, MD  Multiple Vitamins-Minerals (ICAPS PO) Take 1 capsule by mouth daily.    Historical Provider, MD  pantoprazole (PROTONIX) 40 MG tablet TAKE 1 TABLET BY MOUTH EVERY MORNING BEFORE BREAKFAST 08/29/15   Gatha Mayer, MD  timolol (TIMOPTIC) 0.5 % ophthalmic solution Place 1 drop into both eyes every morning.  10/08/12   Historical Provider, MD   Physical Exam: Filed Vitals:   10/05/15 0041 10/05/15 0100 10/05/15 0113 10/05/15 0125  BP: 151/82  139/65   Pulse: 86  87 90  Temp:  99.7 F (37.6 C)  98.4 F (36.9 C)   TempSrc: Oral  Oral   Resp: 17  18   Height:   5\' 1"  (1.549 m)   Weight:   70.67 kg (155 lb 12.8 oz)   SpO2: 95% 93% 100%     Wt Readings from Last 3 Encounters:  10/05/15 70.67 kg (155 lb 12.8 oz)  09/05/15 59.421 kg (131 lb)  04/30/15 57.153 kg (126 lb)    General: Looks acutely ill Eyes: PERRL, normal lids, irises & conjunctiva ENT: grossly normal hearing, lips & tongue, oral mucosa is dry. Neck: no LAD, masses or thyromegaly Cardiovascular: Tachycardic at 106 bpm, no m/r/g. No LE edema. Telemetry: Sinus tachycardia at 106 bpm Respiratory: Mildly decreased breath sounds with bibasilar Rales, mild wheezing/rhonchi  bilaterally. Abdomen: soft, ntnd Skin: no rash or induration seen on limited exam Musculoskeletal: grossly normal tone BUE/BLE Psychiatric: grossly normal mood and affect, speech fluent and appropriate Neurologic: Awake, alert, oriented 3, grossly non-focal.          Labs on Admission:  Basic Metabolic Panel:  Recent Labs Lab 10/04/15 1923  NA 141  K 3.7  CL 105  CO2 27  GLUCOSE 109*  BUN 13  CREATININE 0.70  CALCIUM 8.3*  MG 1.4*  PHOS 2.8   Liver Function Tests:  Recent Labs Lab 10/04/15 1923  AST 21  ALT 18  ALKPHOS 45  BILITOT 0.5  PROT 6.1*  ALBUMIN 3.6   CBC:  Recent Labs Lab 10/04/15 1923  WBC 6.6  NEUTROABS 4.9  HGB 12.1  HCT 36.8  MCV 95.1  PLT 219    Radiological Exams on Admission: Dg Chest 2 View  10/04/2015  CLINICAL DATA:  Fever with weakness and nausea. EXAM: CHEST  2 VIEW COMPARISON:  04/30/2015 FINDINGS: AP and lateral views of the chest show low volumes without focal airspace consolidation. Low volume film likely accentuates the mediastinal contours. Cardiopericardial silhouette is at upper limits of normal for size. The visualized bony structures of the thorax are intact. IMPRESSION: Low volume film without acute cardiopulmonary findings. Electronically  Signed   By: Misty Stanley M.D.   On: 10/04/2015 19:31      Assessment/Plan Principal Problem:   Acute bronchitis with bronchospasm   Hypoxia Admit to observation. Continue supplemental oxygen. Continue gentle IV hydration. Continue bronchodilators. Continue IV antibiotics. Single dose methylprednisolone 40 mg IVP. Guaifenesin and dextromethorphan for cough Follow  influenza by PCR swab. Follow-up blood cultures and sensitivity.  Active Problems:  History of SVT (supraventricular tachycardia). Check magnesium level and replace as needed. Keep electrolytes optimized. K over 4.0 and magnesium over 2.0    Depression. Continue Lexapro and alprazolam as needed.    Glaucoma associated with ocular disorder. Continue daily timolol drops.    Essential hypertension. Continue Cardizem 240 mg by mouth daily Continue Avapro 300 mg by mouth daily    GERD (gastroesophageal reflux disease). Pantoprazole 40 mg by mouth daily.    Code Status: Full code. DVT Prophylaxis: Lovenox SQ. Family Communication:  Disposition Plan: Admit to telemetry, bronchodilators, supplemental oxygen, IV antibiotics.  Time spent: Over 70 minutes were spent in the process of his admission.  Reubin Milan, M.D. [facility] Pager [phone].

## 2015-10-05 NOTE — Progress Notes (Signed)
[facility] Progress Note   [name]  [id number]  DOB: Feb 03, 1941  DOA: 10/04/2015 PCP: Garret Reddish, MD  Brief narrative: [name] is a 75 y.o. female presents to the hospital with fever and nausea cough headache shortness of breath and wheezing. She has been sick for 3 days. Temperature noted to be 102 in the ER.   Subjective: Feels about the same as yesterday but not as nauseated. She has a dry cough. She is not short of breath at rest. No vomiting abdominal pain or diarrhea  Assessment/Plan: Principal Problem:   Influenza A/acute hypoxemic respiratory failure - have started Tamiflu- we'll DC Rocephin and azithromycin as there is no pneumonia and cough is nonproductive- wean oxygen as able- Tylenol for fevers- when necessary nebulizer treatments for wheezing  Active Problems: Hypertension -Continue ARB, Cardizem and HCTZ    Glaucoma associated with ocular disorder -Continue timolol    SVT (supraventricular tachycardia) (HCC) -Continue Cardizem    GERD (gastroesophageal reflux disease) --Continue Protonix   Antibiotics: Anti-infectives    Start     Dose/Rate Route Frequency Ordered Stop   10/05/15 2200  cefTRIAXone (ROCEPHIN) 1 g in dextrose 5 % 50 mL IVPB     1 g 100 mL/hr over 30 Minutes Intravenous Every 24 hours 10/05/15 0115     10/05/15 1200  azithromycin (ZITHROMAX) tablet 500 mg     500 mg Oral Daily 10/05/15 0115 10/10/15 0959   10/05/15 1200  oseltamivir (TAMIFLU) capsule 30 mg     30 mg Oral 2 times daily 10/05/15 1014 10/10/15 0959   10/04/15 2245  cefTRIAXone (ROCEPHIN) 1 g in dextrose 5 % 50 mL IVPB     1 g 100 mL/hr over 30 Minutes Intravenous  Once 10/04/15 2240 10/05/15 0040   10/04/15 2245  azithromycin (ZITHROMAX) 500 mg in dextrose 5 % 250 mL IVPB     500 mg 250 mL/hr over 60 Minutes Intravenous  Once 10/04/15 2240 10/05/15 0133     Code Status:     Code Status Orders        Start     Ordered   10/05/15 0116   Full code   Continuous     10/05/15 0115    Code Status History    Date Active Date Inactive Code Status Order ID Comments User Context   This patient has a current code status but no historical code status.    Advance Directive Documentation        Most Recent Value   Type of Advance Directive  Living will, Healthcare Power of Attorney [daughters ]   Pre-existing out of facility DNR order (yellow form or pink MOST form)     "MOST" Form in Place?       Family Communication: daughter  Disposition Plan: Hopefully home tomorrow DVT prophylaxis: Lovenox Consultants:  Procedures:     Objective: Filed Weights   10/05/15 0113  Weight: 70.67 kg (155 lb 12.8 oz)    Intake/Output Summary (Last 24 hours) at 10/05/15 1511 Last data filed at 10/05/15 1005  Gross per 24 hour  Intake   1169 ml  Output   1100 ml  Net     69 ml     Vitals Filed Vitals:   10/05/15 0113 10/05/15 0125 10/05/15 0626 10/05/15 1304  BP: 139/65  135/83 123/69  Pulse: 87 90 87 81  Temp: 98.4 F (36.9 C)  97.5 F (36.4 C) 97.7 F (36.5 C)  TempSrc: Oral  Oral  Oral  Resp: 18  17 16   Height: 5\' 1"  (1.549 m)     Weight: 70.67 kg (155 lb 12.8 oz)     SpO2: 100%  100% 97%    Exam:  General:  Pt is alert, not in acute distress  HEENT: No icterus, No thrush, oral mucosa moist  Cardiovascular: regular rate and rhythm, S1/S2 No murmur  Respiratory: clear to auscultation bilaterally   Abdomen: Soft, +Bowel sounds, non tender, non distended, no guarding  MSK: No cyanosis or clubbing- no pedal edema   Data Reviewed: Basic Metabolic Panel:  Recent Labs Lab 10/04/15 1923 10/05/15 0356  NA 141 140  K 3.7 3.6  CL 105 105  CO2 27 25  GLUCOSE 109* 122*  BUN 13 11  CREATININE 0.70 0.67  CALCIUM 8.3* 7.9*  MG 1.4*  --   PHOS 2.8  --    Liver Function Tests:  Recent Labs Lab 10/04/15 1923 10/05/15 0356  AST 21 20  ALT 18 18  ALKPHOS 45 40  BILITOT 0.5 0.7  PROT 6.1* 5.6*  ALBUMIN 3.6  3.3*   No results for input(s): LIPASE, AMYLASE in the last 168 hours. No results for input(s): AMMONIA in the last 168 hours. CBC:  Recent Labs Lab 10/04/15 1923 10/05/15 0356  WBC 6.6 6.2  NEUTROABS 4.9 4.0  HGB 12.1 11.5*  HCT 36.8 35.9*  MCV 95.1 96.2  PLT 219 191   Cardiac Enzymes: No results for input(s): CKTOTAL, CKMB, CKMBINDEX, TROPONINI in the last 168 hours. BNP (last 3 results) No results for input(s): BNP in the last 8760 hours.  ProBNP (last 3 results) No results for input(s): PROBNP in the last 8760 hours.  CBG: No results for input(s): GLUCAP in the last 168 hours.  No results found for this or any previous visit (from the past 240 hour(s)).   Studies: Dg Chest 2 View  10/04/2015  CLINICAL DATA:  Fever with weakness and nausea. EXAM: CHEST  2 VIEW COMPARISON:  04/30/2015 FINDINGS: AP and lateral views of the chest show low volumes without focal airspace consolidation. Low volume film likely accentuates the mediastinal contours. Cardiopericardial silhouette is at upper limits of normal for size. The visualized bony structures of the thorax are intact. IMPRESSION: Low volume film without acute cardiopulmonary findings. Electronically Signed   By: Misty Stanley M.D.   On: 10/04/2015 19:31    Scheduled Meds:  Scheduled Meds: . antiseptic oral rinse  7 mL Mouth Rinse BID  . azithromycin  500 mg Oral Daily  . cefTRIAXone (ROCEPHIN)  IV  1 g Intravenous Q24H  . dextromethorphan-guaiFENesin  1 tablet Oral BID  . diltiazem  240 mg Oral Daily  . enoxaparin (LOVENOX) injection  40 mg Subcutaneous Daily  . escitalopram  20 mg Oral Daily  . hydrochlorothiazide  25 mg Oral Daily  . irbesartan  300 mg Oral Daily  . oseltamivir  30 mg Oral BID  . pantoprazole  40 mg Oral Daily  . timolol  1 drop Both Eyes Daily   Continuous Infusions: . 0.9 % NaCl with KCl 20 mEq / L 1,000 mL (10/05/15 0144)    Time spent on care of this patient: 59 min   Monte Sereno,  MD 10/05/2015, 3:11 PM    [facility] Office  [phone] Pager - Text Page per [email or web address] If 7PM-7AM, please contact night-coverage [email or web address]

## 2015-10-05 NOTE — Care Management Note (Signed)
Case Management Note  Patient Details  Name: [name] MRN: [id number] Date of Birth: [birth year]  Subjective/Objective: 75 yo f admitted w/Acute bronchilitis w/bronchospasm. From home.PT cons-await recc.                   Action/Plan:d/c plan home.   Expected Discharge Date:                 Expected Discharge Plan:  [place]  In-House Referral:     Discharge planning Services  CM Consult  Post Acute Care Choice:    Choice offered to:     DME Arranged:    DME Agency:     HH Arranged:    [place] Agency:     Status of Service:  In process, will continue to follow  Medicare Important Message Given:    Date Medicare IM Given:    Medicare IM give by:    Date Additional Medicare IM Given:    Additional Medicare Important Message give by:     If discussed at [place] of Stay Meetings, dates discussed:    Additional Comments:  Dessa Phi, RN 10/05/2015, 10:05 AM

## 2015-10-05 NOTE — Progress Notes (Signed)
PT Cancellation Note  Patient Details Name: [name] MRN: [id number] DOB: [birth year]   Cancelled Treatment:    Reason Eval/Treat Not Completed: PT screened, no needs identified, will sign off   [facility] 10/05/2015, 2:08 PM

## 2015-10-05 NOTE — Progress Notes (Signed)
Patient brought to room 1330 from ED by RN. Report obtained. Placed on Droplet Precautions. R/O Flu.

## 2015-10-05 NOTE — Care Management Obs Status (Signed)
[place] NOTIFICATION   Patient Details  Name: [name] MRN: [id number] Date of Birth: 1940-10-18   Medicare Observation Status Notification Given:  Yes    Dessa Phi, RN 10/05/2015, 12:05 PM

## 2015-10-06 DIAGNOSIS — J209 Acute bronchitis, unspecified: Secondary | ICD-10-CM | POA: Diagnosis not present

## 2015-10-06 DIAGNOSIS — R0902 Hypoxemia: Secondary | ICD-10-CM

## 2015-10-06 DIAGNOSIS — I10 Essential (primary) hypertension: Secondary | ICD-10-CM | POA: Diagnosis not present

## 2015-10-06 DIAGNOSIS — R509 Fever, unspecified: Secondary | ICD-10-CM | POA: Diagnosis not present

## 2015-10-06 DIAGNOSIS — I471 Supraventricular tachycardia: Secondary | ICD-10-CM

## 2015-10-06 DIAGNOSIS — J101 Influenza due to other identified influenza virus with other respiratory manifestations: Secondary | ICD-10-CM | POA: Diagnosis not present

## 2015-10-06 DIAGNOSIS — K219 Gastro-esophageal reflux disease without esophagitis: Secondary | ICD-10-CM | POA: Diagnosis not present

## 2015-10-06 LAB — BASIC METABOLIC PANEL
ANION GAP: 9 (ref 5–15)
BUN: 14 mg/dL (ref 6–20)
CALCIUM: 8 mg/dL — AB (ref 8.9–10.3)
CO2: 28 mmol/L (ref 22–32)
Chloride: 106 mmol/L (ref 101–111)
Creatinine, Ser: 0.62 mg/dL (ref 0.44–1.00)
GFR calc Af Amer: >60 mL/min (ref >60)
GFR calc non Af Amer: >60 mL/min (ref >60)
GLUCOSE: 112 mg/dL — AB (ref 65–99)
Potassium: 4 mmol/L (ref 3.5–5.1)
Sodium: 143 mmol/L (ref 135–145)

## 2015-10-06 LAB — CBC
HEMATOCRIT: 35.5 % — AB (ref 36.0–46.0)
HEMOGLOBIN: 11.5 g/dL — AB (ref 12.0–15.0)
MCH: 31 pg (ref 26.0–34.0)
MCHC: 32.4 g/dL (ref 30.0–36.0)
MCV: 95.7 fL (ref 78.0–100.0)
Platelets: 202 10*3/uL (ref 150–400)
RBC: 3.71 MIL/uL — ABNORMAL LOW (ref 3.87–5.11)
RDW: 12.8 % (ref 11.5–15.5)
WBC: 6.7 10*3/uL (ref 4.0–10.5)

## 2015-10-06 LAB — URINE CULTURE

## 2015-10-06 MED ORDER — DM-GUAIFENESIN ER 30-600 MG PO TB12: 1.0000 | ORAL_TABLET | Freq: Two times a day (BID) | ORAL | Status: DC

## 2015-10-06 MED ORDER — OSELTAMIVIR PHOSPHATE 30 MG PO CAPS: 30.0000 mg | ORAL_CAPSULE | Freq: Two times a day (BID) | ORAL | Status: DC

## 2015-10-06 NOTE — Discharge Summary (Signed)
Physician Discharge Summary  [name] [id number] DOB: [birth year] DOA: 10/04/2015  PCP: Garret Reddish, MD  Admit date: 10/04/2015 Discharge date: 10/06/2015  Time spent: 45 minutes     Discharge Condition: stable    Discharge Diagnoses:  Principal Problem:   Influenza A Active Problems:   Depression   Glaucoma associated with ocular disorder   Essential hypertension   SVT (supraventricular tachycardia) (HCC)   GERD (gastroesophageal reflux disease)   Hypoxia   History of present illness:  [name] is a 75 y.o. female presents to the hospital with fever and nausea cough headache shortness of breath and wheezing. She has been sick for 3 days. Temperature noted to be 102 in the ER.    Hospital Course:  Principal Problem:  Influenza A/acute hypoxemic respiratory failure -Flu PCR found the be positive -  have started Tamiflu - DC'd Rocephin and azithromycin which was started on admission as there is no pneumonia  - wean off of oxygen  - fever resolved - she is feeling much better today and is stable to be discharged home  Active Problems: Hypertension -Continue ARB, Cardizem and HCTZ   Glaucoma associated with ocular disorder -Continue timolol   h/o SVT (supraventricular tachycardia) (HCC) -Continue Cardizem   GERD (gastroesophageal reflux disease) --Continue Protonix     Discharge Exam: Filed Weights   10/05/15 0113  Weight: 70.67 kg (155 lb 12.8 oz)   Filed Vitals:   10/05/15 2138 10/06/15 0658  BP: 127/70 112/65  Pulse: 75 65  Temp: 98.3 F (36.8 C) 98.3 F (36.8 C)  Resp: 16 16    General: AAO x 3, no distress Cardiovascular: RRR, no murmurs  Respiratory: clear to auscultation bilaterally GI: soft, non-tender, non-distended, bowel sound positive  Discharge Instructions You were cared for by a hospitalist during your hospital stay. If you have any questions about your discharge medications or the care you received while you  were in the hospital after you are discharged, you can call the unit and asked to speak with the hospitalist on call if the hospitalist that took care of you is not available. Once you are discharged, your primary care physician will handle any further medical issues. Please note that NO REFILLS for any discharge medications will be authorized once you are discharged, as it is imperative that you return to your primary care physician (or establish a relationship with a primary care physician if you do not have one) for your aftercare needs so that they can reassess your need for medications and monitor your lab values.  Discharge Instructions    Diet - low sodium heart healthy    Complete by:  As directed      Increase activity slowly    Complete by:  As directed             Medication List    STOP taking these medications        ciprofloxacin 500 MG tablet  Commonly known as:  CIPRO     metroNIDAZOLE 500 MG tablet  Commonly known as:  FLAGYL      TAKE these medications        acetaminophen 325 MG tablet  Commonly known as:  TYLENOL  Take 650 mg by mouth every 6 (six) hours as needed for moderate pain.     ALPRAZolam 0.25 MG tablet  Commonly known as:  XANAX  TAKE 1 TABLET BY MOUTH EVERY DAY AS NEEDED     BIOTIN PO  Take  1 capsule by mouth daily.     CALCIUM + D PO  Take 1 tablet by mouth daily.     CARTIA XT 240 MG 24 hr capsule  Generic drug:  diltiazem  TAKE 1 CAPSULE BY MOUTH DAILY     dextromethorphan-guaiFENesin 30-600 MG 12hr tablet  Commonly known as:  MUCINEX DM  Take 1 tablet by mouth 2 (two) times daily.     escitalopram 20 MG tablet  Commonly known as:  LEXAPRO  TAKE 1 TABLET BY MOUTH EVERY DAY     hydrochlorothiazide 25 MG tablet  Commonly known as:  HYDRODIURIL  TAKE 1 TABLET BY MOUTH DAILY     ICAPS PO  Take 1 capsule by mouth daily.     irbesartan 300 MG tablet  Commonly known as:  AVAPRO  TAKE 1 TABLET BY MOUTH EVERY DAY     oseltamivir 30  MG capsule  Commonly known as:  TAMIFLU  Take 1 capsule (30 mg total) by mouth 2 (two) times daily.     pantoprazole 40 MG tablet  Commonly known as:  PROTONIX  TAKE 1 TABLET BY MOUTH EVERY MORNING BEFORE BREAKFAST     timolol 0.5 % ophthalmic solution  Commonly known as:  TIMOPTIC  Place 1 drop into both eyes every morning.       Allergies  Allergen Reactions  . Codeine Nausea Only      The results of significant diagnostics from this hospitalization (including imaging, microbiology, ancillary and laboratory) are listed below for reference.    Significant Diagnostic Studies: Dg Chest 2 View  10/04/2015  CLINICAL DATA:  Fever with weakness and nausea. EXAM: CHEST  2 VIEW COMPARISON:  04/30/2015 FINDINGS: AP and lateral views of the chest show low volumes without focal airspace consolidation. Low volume film likely accentuates the mediastinal contours. Cardiopericardial silhouette is at upper limits of normal for size. The visualized bony structures of the thorax are intact. IMPRESSION: Low volume film without acute cardiopulmonary findings. Electronically Signed   By: Misty Stanley M.D.   On: 10/04/2015 19:31    Microbiology: Recent Results (from the past 240 hour(s))  Culture, blood (routine x 2)     Status: None (Preliminary result)   Collection Time: 10/04/15  8:14 PM  Result Value Ref Range Status   Specimen Description BLOOD RIGHT ANTECUBITAL  Final   Special Requests IN PEDIATRIC BOTTLE 2CC  Final   Culture   Final    NO GROWTH < 24 HOURS Performed at [facility]    Report Status PENDING  Incomplete  Culture, blood (routine x 2)     Status: None (Preliminary result)   Collection Time: 10/04/15  8:14 PM  Result Value Ref Range Status   Specimen Description BLOOD LEFT HAND  Final   Special Requests IN PEDIATRIC BOTTLE 2CC  Final   Culture   Final    NO GROWTH < 24 HOURS Performed at [facility]    Report Status PENDING  Incomplete      Labs: Basic Metabolic Panel:  Recent Labs Lab 10/04/15 1923 10/05/15 0356 10/06/15 0355  NA 141 140 143  K 3.7 3.6 4.0  CL 105 105 106  CO2 27 25 28   GLUCOSE 109* 122* 112*  BUN 13 11 14   CREATININE 0.70 0.67 0.62  CALCIUM 8.3* 7.9* 8.0*  MG 1.4*  --   --   PHOS 2.8  --   --    Liver Function Tests:  Recent Labs Lab  10/04/15 1923 10/05/15 0356  AST 21 20  ALT 18 18  ALKPHOS 45 40  BILITOT 0.5 0.7  PROT 6.1* 5.6*  ALBUMIN 3.6 3.3*   No results for input(s): LIPASE, AMYLASE in the last 168 hours. No results for input(s): AMMONIA in the last 168 hours. CBC:  Recent Labs Lab 10/04/15 1923 10/05/15 0356 10/06/15 0355  WBC 6.6 6.2 6.7  NEUTROABS 4.9 4.0  --   HGB 12.1 11.5* 11.5*  HCT 36.8 35.9* 35.5*  MCV 95.1 96.2 95.7  PLT 219 191 202   Cardiac Enzymes: No results for input(s): CKTOTAL, CKMB, CKMBINDEX, TROPONINI in the last 168 hours. BNP: BNP (last 3 results) No results for input(s): BNP in the last 8760 hours.  ProBNP (last 3 results) No results for input(s): PROBNP in the last 8760 hours.  CBG: No results for input(s): GLUCAP in the last 168 hours.     SignedDebbe Odea, MD [facility] 10/06/2015, 9:00 AM

## 2015-10-06 NOTE — Progress Notes (Signed)
Patient  discharged to home all discharge medications and instructions reviewed and questions answered.  Patient to be assisted to vehicle by wheelchair.  

## 2015-10-08 LAB — RESPIRATORY VIRUS PANEL
ADENOVIRUS: NEGATIVE
INFLUENZA A: POSITIVE — AB
Influenza B: NEGATIVE
Metapneumovirus: NEGATIVE
PARAINFLUENZA 3 A: NEGATIVE
Parainfluenza 1: NEGATIVE
Parainfluenza 2: NEGATIVE
RESPIRATORY SYNCYTIAL VIRUS A: NEGATIVE
RHINOVIRUS: NEGATIVE
Respiratory Syncytial Virus B: NEGATIVE

## 2015-10-09 LAB — CULTURE, BLOOD (ROUTINE X 2)
Culture: NO GROWTH
Culture: NO GROWTH

## 2015-11-09 ENCOUNTER — Other Ambulatory Visit: Payer: Self-pay | Admitting: Family Medicine

## 2015-12-03 ENCOUNTER — Other Ambulatory Visit: Payer: Self-pay | Admitting: Internal Medicine

## 2015-12-04 ENCOUNTER — Other Ambulatory Visit: Payer: Self-pay | Admitting: Family Medicine

## 2015-12-07 LAB — HM MAMMOGRAPHY: HM Mammogram: NORMAL

## 2015-12-11 ENCOUNTER — Encounter: Payer: Self-pay | Admitting: Family Medicine

## 2015-12-11 ENCOUNTER — Encounter: Payer: Self-pay | Admitting: Gynecology

## 2015-12-13 LAB — HM MAMMOGRAPHY: HM Mammogram: NORMAL (ref 0–4)

## 2015-12-14 ENCOUNTER — Encounter: Payer: Self-pay | Admitting: Family Medicine

## 2016-02-26 ENCOUNTER — Other Ambulatory Visit: Payer: Self-pay | Admitting: Internal Medicine

## 2016-03-13 ENCOUNTER — Other Ambulatory Visit: Payer: Self-pay | Admitting: Family Medicine

## 2016-03-13 ENCOUNTER — Encounter: Payer: Self-pay | Admitting: Gynecology

## 2016-03-13 ENCOUNTER — Ambulatory Visit (INDEPENDENT_AMBULATORY_CARE_PROVIDER_SITE_OTHER): Payer: Medicare Other | Admitting: Gynecology

## 2016-03-13 VITALS — BP 124/76 | Ht 61.0 in | Wt 128.0 lb

## 2016-03-13 DIAGNOSIS — M8580 Other specified disorders of bone density and structure, unspecified site: Secondary | ICD-10-CM

## 2016-03-13 DIAGNOSIS — Z01419 Encounter for gynecological examination (general) (routine) without abnormal findings: Secondary | ICD-10-CM | POA: Diagnosis not present

## 2016-03-13 DIAGNOSIS — N816 Rectocele: Secondary | ICD-10-CM

## 2016-03-13 DIAGNOSIS — R102 Pelvic and perineal pain: Secondary | ICD-10-CM

## 2016-03-13 DIAGNOSIS — N952 Postmenopausal atrophic vaginitis: Secondary | ICD-10-CM

## 2016-03-13 DIAGNOSIS — N9489 Other specified conditions associated with female genital organs and menstrual cycle: Secondary | ICD-10-CM | POA: Diagnosis not present

## 2016-03-13 NOTE — Progress Notes (Signed)
    [name] [birth year] [id number]        75 y.o.  [id number]  for breast and pelvic exam. Several issues noted below.  Past medical history,surgical history, problem list, medications, allergies, family history and social history were all reviewed and documented as reviewed in the EPIC chart.  ROS:  Performed with pertinent positives and negatives included in the history, assessment and plan.   Additional significant findings :  None   Exam: [name] assistant Vitals:   03/13/16 1105  BP: 124/76  Weight: 128 lb (58.1 kg)  Height: 5\' 1"  (1.549 m)   Body mass index is 24.19 kg/m.  General appearance:  Normal affect, orientation and appearance. Skin: Grossly normal HEENT: Without gross lesions.  No cervical or supraclavicular adenopathy. Thyroid normal.  Lungs:  Clear without wheezing, rales or rhonchi Cardiac: RR, without RMG Abdominal:  Soft, nontender, without masses, guarding, rebound, organomegaly or hernia Breasts:  Examined lying and sitting without masses, retractions, discharge or axillary adenopathy. Pelvic:  Ext/BUS/Vagina with atrophic changes. Second-degree rectocele noted. No significant cystocele. Cervix appears well supported.  Cervix atrophic  Uterus anteverted, normal size, shape and contour, midline and mobile nontender   Adnexa without masses or tenderness    Anus and perineum normal   Rectovaginal normal sphincter tone without palpated masses or tenderness.    Assessment/Plan:  75 y.o. [id number] female for breast and pelvic exam.   1. Postmenopausal/atrophic genital changes. Without significant hot flushes, night sweats, vaginal dryness or any vaginal bleeding. Continue to monitor and report any issues or bleeding. 2. Pelvic pressure/rectocele. Patient continues to have pelvic pressure daily. Not significantly uncomfortable but she notices it. Saw Dr. Carlean Purl who discussed the rectocele with her. Does not appear to have issues with bowel movements such  as stool trapping or something protruding from the vagina. Ultrasound last year showed no significant pelvic pathology excepting several small myomas. Reviewed with patient that certainly a rectocele if she is having some other mild pelvic relaxation standing may attribute to her pressure-like symptoms. Unusual though that she is not having issues with bowel movements although she does note that her bowel movements are soft and regular. From a rectocele standpoint options to include pessary and surgery discussed. Patient is not interested in intervention now as again she says is really not painful she just notes it when she is on her feet. She does not note it when she's lying down. Will check urinalysis today. Patient will call me if she wants to pursue alternatives. 3. Osteopenia. DEXA 02/2015 T score -1.4 FRAX 7%/0.6%. This was in the distal third of the radius. Other measurements were all normal. Plan repeat DEXA next year at 2 year interval. Increased calcium vitamin D reviewed. 4. Pap smear 2012. No Pap smear done today. No history of abnormal Pap smears. [place] screening guidelines we both agree to stop screening issues over the age 33. 53. Mammography 12/2015. Continue with annual mammography when due. SBE monthly reviewed. 6. Colonoscopy 2013. Repeat at their recommended interval. 7. Health maintenance. No routine lab work done as patient reports is done elsewhere. Follow up in one year, sooner as needed.  Greater than 10 minutes of my time in excess of her breast and pelvic exam was spent in direct face to face counseling and coordination of care in regards to her problems of pelvic pressure and rectocele.Anastasio Auerbach MD, 11:38 AM 03/13/2016

## 2016-03-13 NOTE — Patient Instructions (Signed)

## 2016-03-13 NOTE — Telephone Encounter (Signed)
Ok to refill 

## 2016-03-14 LAB — URINALYSIS W MICROSCOPIC + REFLEX CULTURE
Bacteria, UA: NONE SEEN [HPF]
Bilirubin Urine: NEGATIVE
Casts: NONE SEEN [LPF]
Crystals: NONE SEEN [HPF]
GLUCOSE, UA: NEGATIVE
Hgb urine dipstick: NEGATIVE
Ketones, ur: NEGATIVE
Nitrite: NEGATIVE
PH: 6 (ref 5.0–8.0)
Protein, ur: NEGATIVE
SPECIFIC GRAVITY, URINE: 1.022 (ref 1.001–1.035)
YEAST: NONE SEEN [HPF]

## 2016-03-14 NOTE — Telephone Encounter (Signed)
Yes thanks 330 should last 3-6 months

## 2016-03-15 LAB — URINE CULTURE: Organism ID, Bacteria: 10000

## 2016-05-29 ENCOUNTER — Other Ambulatory Visit: Payer: Self-pay | Admitting: Family Medicine

## 2016-05-29 ENCOUNTER — Other Ambulatory Visit: Payer: Self-pay | Admitting: Internal Medicine

## 2016-07-05 ENCOUNTER — Other Ambulatory Visit: Payer: Self-pay | Admitting: Family Medicine

## 2016-07-07 NOTE — Telephone Encounter (Signed)
Called patient and left a voicemail message. She needs to make an appointment. She has not been seen since 04/2015

## 2016-07-14 ENCOUNTER — Ambulatory Visit (INDEPENDENT_AMBULATORY_CARE_PROVIDER_SITE_OTHER): Payer: Medicare Other | Admitting: Family Medicine

## 2016-07-14 ENCOUNTER — Encounter: Payer: Self-pay | Admitting: Family Medicine

## 2016-07-14 DIAGNOSIS — F325 Major depressive disorder, single episode, in full remission: Secondary | ICD-10-CM

## 2016-07-14 DIAGNOSIS — I10 Essential (primary) hypertension: Secondary | ICD-10-CM

## 2016-07-14 DIAGNOSIS — K219 Gastro-esophageal reflux disease without esophagitis: Secondary | ICD-10-CM | POA: Diagnosis not present

## 2016-07-14 NOTE — Assessment & Plan Note (Signed)
S: well controlled on Lexapro 20mg . Anxiety at times on Xanax once a week about.  A/P: refill in January when meds needed

## 2016-07-14 NOTE — Assessment & Plan Note (Signed)
S: controlled on cartia 240mg   24 hr daily, hydrochlorothiazide 25mg , irbesaran 300mg .  BP Readings from Last 3 Encounters:  07/14/16 136/82  03/13/16 124/76  10/06/15 112/65  A/P:Continue current meds:  Continue current meds- wants refills through [facility] mail order come January but states has enough meds until that time

## 2016-07-14 NOTE — Progress Notes (Signed)
Subjective:  [name] is a 75 y.o. year old very pleasant female patient who presents for/with See problem oriented charting ROS- No chest pain or shortness of breath. No headache or blurry vision.   Past Medical History-  Patient Active Problem List   Diagnosis Date Noted  . SVT (supraventricular tachycardia) ([place]) 08/14/2011    Priority: High  . GERD (gastroesophageal reflux disease) 07/14/2014    Priority: Medium  . IBS (irritable bowel syndrome)- diarrhea predominant 01/26/2013    Priority: Medium  . Glaucoma associated with ocular disorder 11/05/2009    Priority: Medium  . Hyperlipidemia 09/22/2007    Priority: Medium  . Depression 03/11/2007    Priority: Medium  . Essential hypertension 03/11/2007    Priority: Medium  . History of colonic polyps 11/28/2011    Priority: Low  . Cervical disc disorder with radiculopathy of cervical region 11/05/2009    Priority: Low  . Rosacea 03/11/2007    Priority: Low    Medications- reviewed and updated Current Outpatient Prescriptions  Medication Sig Dispense Refill  . acetaminophen (TYLENOL) 325 MG tablet Take 650 mg by mouth every 6 (six) hours as needed for moderate pain.    [name] ALPRAZolam (XANAX) 0.25 MG tablet TAKE 1 TABLET BY MOUTH EVERY DAY AS NEEDED 30 tablet 0  . BIOTIN PO Take 1 capsule by mouth daily.     . Calcium Carbonate-Vitamin D (CALCIUM + D PO) Take 1 tablet by mouth daily.      [name] CARTIA XT 240 MG 24 hr capsule TAKE 1 CAPSULE BY MOUTH DAILY 90 capsule 2  . escitalopram (LEXAPRO) 20 MG tablet TAKE 1 TABLET BY MOUTH EVERY DAY 90 tablet 3  . hydrochlorothiazide (HYDRODIURIL) 25 MG tablet TAKE 1 TABLET BY MOUTH DAILY 90 tablet 0  . irbesartan (AVAPRO) 300 MG tablet TAKE 1 TABLET BY MOUTH EVERY DAY 90 tablet 1  . pantoprazole (PROTONIX) 40 MG tablet TAKE 1 TABLET BY MOUTH EVERY MORNING BEFORE BREAKFAST 90 tablet 0  . timolol (TIMOPTIC) 0.5 % ophthalmic solution Place 1 drop into both eyes every morning.      No  current facility-administered medications for this visit.     Objective: BP 136/82   Pulse 80   Temp 97.9 F (36.6 C) (Oral)   Ht 4' 11.25" (1.505 m)   Wt 127 lb 12.8 oz (58 kg)   LMP 08/12/1991   SpO2 98%   BMI 25.60 kg/m  Gen: NAD, resting comfortably Mucous membranes are moist.TM normal. Hearing normal with aids in CV: RRR no murmurs rubs or gallops Lungs: CTAB no crackles, wheeze, rhonchi Abdomen: soft/nontender/nondistended/normal bowel sounds. Slightly overweight Ext: no edema Skin: warm, dry, some seborrheic keratosis on lower legs  Assessment/Plan:  Essential hypertension S: controlled on cartia 240mg   24 hr daily, hydrochlorothiazide 25mg , irbesaran 300mg .  BP Readings from Last 3 Encounters:  07/14/16 136/82  03/13/16 124/76  10/06/15 112/65  A/P:Continue current meds:  Continue current meds- wants refills through [facility] mail order come January but states has enough meds until that time  GERD (gastroesophageal reflux disease) S: protonix daily for acid reflux. Family history of stroke A/P: trial zantac after discussion of possible SE PPI  Depression S: well controlled on Lexapro 20mg . Anxiety at times on Xanax once a week about.  A/P: refill in January when meds needed   6 months  Orders Placed This Encounter  Procedures  . CBC    Standing Status:   Future    Standing Expiration Date:  07/14/2017  . Comprehensive metabolic panel    [place]    Standing Status:   Future    Standing Expiration Date:   07/14/2017  . Lipid panel    Standing Status:   Future    Standing Expiration Date:   07/14/2017   Return precautions advised.  Garret Reddish, MD

## 2016-07-14 NOTE — Assessment & Plan Note (Signed)
S: protonix daily for acid reflux. Family history of stroke A/P: trial zantac after discussion of possible SE PPI

## 2016-07-14 NOTE — Patient Instructions (Signed)
Ok on lexapro- no refills yet?  Trial zantac 150mg  twice a day  Schedule a lab visit at the check out desk within 2 weeks. Return for future fasting labs meaning nothing but water after midnight please. Ok to take your medications with water.

## 2016-07-14 NOTE — Progress Notes (Signed)
Pre visit review using our clinic review tool, if applicable. No additional management support is needed unless otherwise documented below in the visit note. 

## 2016-07-18 ENCOUNTER — Other Ambulatory Visit: Payer: Self-pay

## 2016-07-25 ENCOUNTER — Ambulatory Visit (INDEPENDENT_AMBULATORY_CARE_PROVIDER_SITE_OTHER): Payer: Medicare Other

## 2016-07-25 ENCOUNTER — Other Ambulatory Visit (INDEPENDENT_AMBULATORY_CARE_PROVIDER_SITE_OTHER): Payer: Medicare Other

## 2016-07-25 VITALS — BP 114/70 | HR 69 | Ht 60.0 in | Wt 127.4 lb

## 2016-07-25 DIAGNOSIS — Z0000 Encounter for general adult medical examination without abnormal findings: Secondary | ICD-10-CM

## 2016-07-25 DIAGNOSIS — I10 Essential (primary) hypertension: Secondary | ICD-10-CM

## 2016-07-25 LAB — COMPREHENSIVE METABOLIC PANEL
ALBUMIN: 4.4 g/dL (ref 3.5–5.2)
ALK PHOS: 59 U/L (ref 39–117)
ALT: 16 U/L (ref 0–35)
AST: 15 U/L (ref 0–37)
BUN: 21 mg/dL (ref 6–23)
CO2: 30 mEq/L (ref 19–32)
CREATININE: 0.85 mg/dL (ref 0.40–1.20)
Calcium: 9.5 mg/dL (ref 8.4–10.5)
Chloride: 102 mEq/L (ref 96–112)
GFR: 68.83 mL/min (ref >60.00)
GLUCOSE: 133 mg/dL — AB (ref 70–99)
Potassium: 3.8 mEq/L (ref 3.5–5.1)
SODIUM: 143 meq/L (ref 135–145)
TOTAL PROTEIN: 6.4 g/dL (ref 6.0–8.3)
Total Bilirubin: 0.7 mg/dL (ref 0.2–1.2)

## 2016-07-25 LAB — CBC
HCT: 39.5 % (ref 36.0–46.0)
Hemoglobin: 13.4 g/dL (ref 12.0–15.0)
MCHC: 33.8 g/dL (ref 30.0–36.0)
MCV: 92.6 fl (ref 78.0–100.0)
Platelets: 289 10*3/uL (ref 150.0–400.0)
RBC: 4.27 Mil/uL (ref 3.87–5.11)
RDW: 12.6 % (ref 11.5–15.5)
WBC: 7.6 10*3/uL (ref 4.0–10.5)

## 2016-07-25 LAB — LDL CHOLESTEROL, DIRECT: LDL DIRECT: 118 mg/dL

## 2016-07-25 LAB — LIPID PANEL
CHOL/HDL RATIO: 5
CHOLESTEROL: 211 mg/dL — AB (ref 0–200)
HDL: 39.5 mg/dL (ref >39.00)
NonHDL: 171.33
Triglycerides: 216 mg/dL — ABNORMAL HIGH (ref 0.0–149.0)
VLDL: 43.2 mg/dL — ABNORMAL HIGH (ref 0.0–40.0)

## 2016-07-25 NOTE — Progress Notes (Signed)
Subjective:   [name] is a 75 y.o. female who presents for Medicare Annual (Subsequent) preventive examination.  The Patient was informed that the wellness visit is to identify future health risk and educate and initiate measures that can reduce risk for increased disease through the lifespan.    NO ROS; Medicare Wellness Visit  Lives with grown son, 5 children and 42 grands Son is disabled from back surgeries Describes health as good, fair or great? Good   Preventive Screening -Counseling & Management  Colonoscopy 11/2011 / no more repeats  Dexa; 02/2015   Low but "no increased risk for fx"  Dr Phineas Real; did dexa;  Does takes calcium + d Mammogram 12/2015  Smoking history/ never smoked  Second Hand Smoke status; son does smoke but does not smoke in the home except in room 1st spouse expired early due to MVA but smoked  and 2nd spouse smoked   ETOH; no    RISK FACTORS Regular exercise  Walk about 3 -5  days a week Will try to increase to 5   Diet Cooks most meals Breakfast; generally does not eat; eats egg Toast; fruit Lunch; eats late so eats a light lunch  Dinner; has vegetables; does not eat meat as she did   Fall risk no Mobility of Functional changes this year? no Lives in home and plans to age in place   Cardiac Risk Factors: sees [name] Advanced aged > 36 in men; >65 in women Hyperlipidemia - chol 14; trig 380; HDL 35; LDL 82 Diabetes bs 112 (labs repeated today)  Family History (storke on fathers side and DM on mother )  Obesity weight is good   Eye exam has glaucoma goes to Dr. Donzetta Kohut from [facility]  No other eye issues   Depression Screen PhQ 2: negative; on medication and working well   1-5 stress; a lot of stress; dtr had pancreatic cancer and now cancer in the liver; (she had 2 children) stable at present   Activities of Daily Living - See functional screen   Cognitive testing; Ad8 score reviewed for issues:  Issues making  decisions:  Less interest in hobbies / activities:  Repeats questions, stories (family complaining):  Trouble using ordinary gadgets (microwave, computer, phone):  Forgets the month or year:   Mismanaging finances:   Remembering appts:  Daily problems with thinking and/or memory: Ad8 score is= 0    Advanced Directives yes; will bring a copy for the chart  Patient Care Team: [name], MD as PCP - General (Family Medicine) Dr. Carlean Purl for GI    Immunization History  Administered Date(s) Administered  . Influenza Split 06/25/2011, 06/11/2012  . Influenza Whole 06/29/2009, 06/27/2010  . Influenza, High Dose Seasonal PF 06/10/2013, 06/02/2014  . Influenza-Unspecified 05/18/2015, 05/15/2016  . Pneumococcal Conjugate-13 07/14/2014  . Pneumococcal Polysaccharide-23 09/11/2005  . Td 11/10/1998, 10/20/2008  . Tdap 02/06/2015  . Zoster 03/22/2008   Required Immunizations needed today  Screening test up to date or reviewed for plan of completion There are no preventive care reminders to display for this patient.      Objective:     Vitals: BP 114/70   Pulse 69   Ht 5' (1.524 m)   Wt 127 lb 6 oz (57.8 kg)   LMP 08/12/1991   SpO2 94%   BMI 24.88 kg/m   Body mass index is 24.88 kg/m.   Tobacco History  Smoking Status  . Never Smoker  Smokeless Tobacco  .  Never Used     Counseling given: Yes   Past Medical History:  Diagnosis Date  . Adenomatous colon polyp 2006  . Anxiety   . Arthritis   . Benign esophageal stricture 10/14/2012  . CERUMEN IMPACTION, BILATERAL 12/13/2007  . CERVICAL RADICULOPATHY 11/05/2009  . Chronic kidney disease    kidney stones  . DEPRESSION 03/11/2007  . DIVERTICULOSIS, COLON 03/11/2007  . Esophagitis, reflux 10/14/2012  . GERD (gastroesophageal reflux disease) 10/14/2012  . Glaucoma   . 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 Hearing loss   . HEMORRHOIDS, EXTERNAL W/O COMPLICATION [id number]   Qualifier:  Diagnosis of  By: Jimmye Norman, LPN, [place] Cunas   . HYPERLIPIDEMIA 09/22/2007  . HYPERTENSION 03/11/2007  . Kidney stone   . Osteopenia 02/2015   T score -1.4 FRAX 7.3%/0.6%  . Rectocele   . Rosacea 03/11/2007  . Tachycardia    Past Surgical History:  Procedure Laterality Date  . ANTERIOR CERVICAL DECOMP/DISCECTOMY FUSION  08/14/2011   Procedure: ANTERIOR CERVICAL DECOMPRESSION/DISCECTOMY FUSION 1 LEVEL/HARDWARE REMOVAL;  Surgeon: [name], MD;  Location: [facility];  Service: Neurosurgery;  Laterality: N/A;  Cervical Six-Seven anterior cervical decompression with fusion interbody prothesis plating and bonegraft with removal of hardware of Cervical Four to Cervical Six   . CERVICAL FUSION     C4-6  . COLONOSCOPY     multiple  . ESOPHAGOGASTRODUODENOSCOPY  10/14/2012   Dr. [name]  . KIDNEY SURGERY     age 79  . TONSILLECTOMY     Family History  Problem Relation Age of Onset  . Mental illness Mother     comitted suicide  . Glaucoma Mother   . Lung cancer Father     smoker  . Hypertension Father   . Heart disease Father     age 98, smoker  . Cerebral palsy Sister   . Stroke Sister     multiple  . Diabetes Maternal Grandmother   . Stroke Paternal Aunt   . Stroke Paternal Uncle   . Stroke Paternal Uncle   . Stroke Paternal Uncle   . Stroke Paternal Aunt   . Pancreatic cancer Daughter   . Colon cancer Neg Hx   . Stomach cancer Neg Hx    History  Sexual Activity  . Sexual activity: No    Comment: 1st intercourse 75 yo-Fewer than 5 partners    Outpatient Encounter Prescriptions as of 07/25/2016  Medication Sig  . acetaminophen (TYLENOL) 325 MG tablet Take 650 mg by mouth every 6 (six) hours as needed for moderate pain.  [name] ALPRAZolam (XANAX) 0.25 MG tablet TAKE 1 TABLET BY MOUTH EVERY DAY AS NEEDED  . BIOTIN PO Take 1 capsule by mouth daily.   . Calcium Carbonate-Vitamin D (CALCIUM + D PO) Take 1 tablet by mouth daily.    [name] CARTIA XT 240 MG 24 hr capsule TAKE 1  CAPSULE BY MOUTH DAILY  . escitalopram (LEXAPRO) 20 MG tablet TAKE 1 TABLET BY MOUTH EVERY DAY  . hydrochlorothiazide (HYDRODIURIL) 25 MG tablet TAKE 1 TABLET BY MOUTH DAILY  . irbesartan (AVAPRO) 300 MG tablet TAKE 1 TABLET BY MOUTH EVERY DAY  . pantoprazole (PROTONIX) 40 MG tablet TAKE 1 TABLET BY MOUTH EVERY MORNING BEFORE BREAKFAST  . timolol (TIMOPTIC) 0.5 % ophthalmic solution Place 1 drop into both eyes every morning.    No facility-administered encounter medications on file as of 07/25/2016.     Activities of Daily Living In your present state of health,  do you have any difficulty performing the following activities: 07/25/2016 10/05/2015  Hearing? N Y  Vision? N N  Difficulty concentrating or making decisions? N N  Walking or climbing stairs? N N  Dressing or bathing? N N  Doing errands, shopping? N N  Preparing Food and eating ? N -  Using the Toilet? N -  In the past six months, have you accidently leaked urine? N -  Do you have problems with loss of bowel control? N -  Managing your Medications? N -  Managing your Finances? N -  Housekeeping or managing your Housekeeping? N -  Some recent data might be hidden    Patient Care Team: [name], MD as PCP - General (Family Medicine)    Assessment:     Exercise Activities and Dietary recommendations Current Exercise Habits: Home exercise routine, Type of exercise: walking, Time (Minutes): 30, Frequency (Times/Week): 5, Weekly Exercise (Minutes/Week): 150, Intensity: Moderate  Goals    . Exercise 150 minutes per week (moderate activity)          Generally will try to walk briskly 5 days a week       Fall Risk Fall Risk  07/25/2016 07/14/2016 01/09/2015  Falls in the past year? No No No   Depression Screen PHQ 2/9 Scores 07/25/2016 07/14/2016 01/09/2015  PHQ - 2 Score 0 0 0     Cognitive Function        Immunization History  Administered Date(s) Administered  . Influenza Split 06/25/2011, 06/11/2012   . Influenza Whole 06/29/2009, 06/27/2010  . Influenza, High Dose Seasonal PF 06/10/2013, 06/02/2014  . Influenza-Unspecified 05/18/2015, 05/15/2016  . Pneumococcal Conjugate-13 07/14/2014  . Pneumococcal Polysaccharide-23 09/11/2005  . Td 11/10/1998, 10/20/2008  . Tdap 02/06/2015  . Zoster 03/22/2008   Screening Tests Health Maintenance  Topic Date Due  . TETANUS/TDAP  02/05/2025  . INFLUENZA VACCINE  Completed  . DEXA SCAN  Completed  . ZOSTAVAX  Completed  . PNA vac Low Risk Adult  Completed      Plan:    PCP Notes  Health Maintenance up to date  Abnormal Screens / no Does not smoke but has been around smokers her entire life  Dr Edilia Bo for glaucoma   Patient concerns; none   Nurse Concerns; addressed her triglycerides;  Will cut back on sweets Will bring copy of HCPOA and LW to the office next visit Will take Calcium 1200 in supplement or food and takes Vit d Educated on strength bearing exercise   Next PCP apt TBS; just seen Dr. Yong Channel   During the course of the visit the patient was educated and counseled about the following appropriate screening and preventive services:   Vaccines to include Pneumoccal, Influenza, Hepatitis B, Td, Zostavax, HCV  Electrocardiogram  Cardiovascular Disease/ cardiology one time per year  Colorectal cancer screening aged out  Bone density screening per Dr. Phineas Real 2016;   Diabetes screening / blood work being repeated  Glaucoma screening under treatment   Mammography/completed   Nutrition counseling reviewed   Patient Instructions (the written plan) was given to the patient.   Wynetta Fines, RN  07/25/2016

## 2016-07-25 NOTE — Patient Instructions (Addendum)
[name] , Thank you for taking time to come for your Medicare Wellness Visit. I appreciate your ongoing commitment to your health goals. Please review the following plan we discussed and let me know if I can assist you in the future.   Will bring a copy of HCPOA or living will when you come back   Will take at least 600 supplement of CA with 800 to 1000 of vit d Weight bearing exercise; includes walking 5 days  Could add 2 lb weights when walking   These are the goals we discussed: Goals    . Exercise 150 minutes per week (moderate activity)          Generally will try to walk briskly 5 days a week        This is a list of the screening recommended for you and due dates:  Health Maintenance  Topic Date Due  . Tetanus Vaccine  02/05/2025  . Flu Shot  Completed  . DEXA scan (bone density measurement)  Completed  . Shingles Vaccine  Completed  . Pneumonia vaccines  Completed        Fall Prevention in the Home Introduction Falls can cause injuries. They can happen to people of all ages. There are many things you can do to make your home safe and to help prevent falls. What can I do on the outside of my home?  Regularly fix the edges of walkways and driveways and fix any cracks.  Remove anything that might make you trip as you walk through a door, such as a raised step or threshold.  Trim any bushes or trees on the path to your home.  Use bright outdoor lighting.  Clear any walking paths of anything that might make someone trip, such as rocks or tools.  Regularly check to see if handrails are loose or broken. Make sure that both sides of any steps have handrails.  Any raised decks and porches should have guardrails on the edges.  Have any leaves, snow, or ice cleared regularly.  Use sand or salt on walking paths during winter.  Clean up any spills in your garage right away. This includes oil or grease spills. What can I do in the bathroom?  Use night  lights.  Install grab bars by the toilet and in the tub and shower. Do not use towel bars as grab bars.  Use non-skid mats or decals in the tub or shower.  If you need to sit down in the shower, use a plastic, non-slip stool.  Keep the floor dry. Clean up any water that spills on the floor as soon as it happens.  Remove soap buildup in the tub or shower regularly.  Attach bath mats securely with double-sided non-slip rug tape.  Do not have throw rugs and other things on the floor that can make you trip. What can I do in the bedroom?  Use night lights.  Make sure that you have a light by your bed that is easy to reach.  Do not use any sheets or blankets that are too big for your bed. They should not hang down onto the floor.  Have a firm chair that has side arms. You can use this for support while you get dressed.  Do not have throw rugs and other things on the floor that can make you trip. What can I do in the kitchen?  Clean up any spills right away.  Avoid walking on wet floors.  Keep  items that you use a lot in easy-to-reach places.  If you need to reach something above you, use a strong step stool that has a grab bar.  Keep electrical cords out of the way.  Do not use floor polish or wax that makes floors slippery. If you must use wax, use non-skid floor wax.  Do not have throw rugs and other things on the floor that can make you trip. What can I do with my stairs?  Do not leave any items on the stairs.  Make sure that there are handrails on both sides of the stairs and use them. Fix handrails that are broken or loose. Make sure that handrails are as long as the stairways.  Check any carpeting to make sure that it is firmly attached to the stairs. Fix any carpet that is loose or worn.  Avoid having throw rugs at the top or bottom of the stairs. If you do have throw rugs, attach them to the floor with carpet tape.  Make sure that you have a light switch at the  top of the stairs and the bottom of the stairs. If you do not have them, ask someone to add them for you. What else can I do to help prevent falls?  Wear shoes that:  Do not have high heels.  Have rubber bottoms.  Are comfortable and fit you well.  Are closed at the toe. Do not wear sandals.  If you use a stepladder:  Make sure that it is fully opened. Do not climb a closed stepladder.  Make sure that both sides of the stepladder are locked into place.  Ask someone to hold it for you, if possible.  Clearly mark and make sure that you can see:  Any grab bars or handrails.  First and last steps.  Where the edge of each step is.  Use tools that help you move around (mobility aids) if they are needed. These include:  Canes.  Walkers.  Scooters.  Crutches.  Turn on the lights when you go into a dark area. Replace any light bulbs as soon as they burn out.  Set up your furniture so you have a clear path. Avoid moving your furniture around.  If any of your floors are uneven, fix them.  If there are any pets around you, be aware of where they are.  Review your medicines with your doctor. Some medicines can make you feel dizzy. This can increase your chance of falling. Ask your doctor what other things that you can do to help prevent falls. This information is not intended to replace advice given to you by your health care provider. Make sure you discuss any questions you have with your health care provider. Document Released: 05/24/2009 Document Revised: 01/03/2016 Document Reviewed: 09/01/2014  2017 [employer]  Health Maintenance, Female Introduction Adopting a healthy lifestyle and getting preventive care can go a long way to promote health and wellness. Talk with your health care provider about what schedule of regular examinations is right for you. This is a good chance for you to check in with your provider about disease prevention and staying healthy. In between  checkups, there are plenty of things you can do on your own. Experts have done a lot of research about which lifestyle changes and preventive measures are most likely to keep you healthy. Ask your health care provider for more information. Weight and diet Eat a healthy diet  Be sure to include plenty of vegetables, fruits,  low-fat dairy products, and lean protein.  Do not eat a lot of foods high in solid fats, added sugars, or salt.  Get regular exercise. This is one of the most important things you can do for your health.  Most adults should exercise for at least 150 minutes each week. The exercise should increase your heart rate and make you sweat (moderate-intensity exercise).  Most adults should also do strengthening exercises at least twice a week. This is in addition to the moderate-intensity exercise. Maintain a healthy weight  Body mass index (BMI) is a measurement that can be used to identify possible weight problems. It estimates body fat based on height and weight. Your health care provider can help determine your BMI and help you achieve or maintain a healthy weight.  For females 76 years of age and older:  A BMI below 18.5 is considered underweight.  A BMI of 18.5 to 24.9 is normal.  A BMI of 25 to 29.9 is considered overweight.  A BMI of 30 and above is considered obese. Watch levels of cholesterol and blood lipids  You should start having your blood tested for lipids and cholesterol at 77 years of age, then have this test every 5 years.  You may need to have your cholesterol levels checked more often if:  Your lipid or cholesterol levels are high.  You are older than 77 years of age.  You are at high risk for heart disease. Cancer screening Lung Cancer  Lung cancer screening is recommended for adults 46-60 years old who are at high risk for lung cancer because of a history of smoking.  A yearly low-dose CT scan of the lungs is recommended for people  who:  Currently smoke.  Have quit within the past 15 years.  Have at least a 30-pack-year history of smoking. A pack year is smoking an average of one pack of cigarettes a day for 1 year.  Yearly screening should continue until it has been 15 years since you quit.  Yearly screening should stop if you develop a health problem that would prevent you from having lung cancer treatment. Breast Cancer  Practice breast self-awareness. This means understanding how your breasts normally appear and feel.  It also means doing regular breast self-exams. Let your health care provider know about any changes, no matter how small.  If you are in your 20s or 30s, you should have a clinical breast exam (CBE) by a health care provider every 1-3 years as part of a regular health exam.  If you are 58 or older, have a CBE every year. Also consider having a breast X-ray (mammogram) every year.  If you have a family history of breast cancer, talk to your health care provider about genetic screening.  If you are at high risk for breast cancer, talk to your health care provider about having an MRI and a mammogram every year.  Breast cancer gene (BRCA) assessment is recommended for women who have family members with BRCA-related cancers. BRCA-related cancers include:  Breast.  Ovarian.  Tubal.  Peritoneal cancers.  Results of the assessment will determine the need for genetic counseling and BRCA1 and BRCA2 testing. Cervical Cancer  Your health care provider may recommend that you be screened regularly for cancer of the pelvic organs (ovaries, uterus, and vagina). This screening involves a pelvic examination, including checking for microscopic changes to the surface of your cervix (Pap test). You may be encouraged to have this screening done every 3 years,  beginning at age 34.  For women ages 10-65, health care providers may recommend pelvic exams and Pap testing every 3 years, or they may recommend the  Pap and pelvic exam, combined with testing for human papilloma virus (HPV), every 5 years. Some types of HPV increase your risk of cervical cancer. Testing for HPV may also be done on women of any age with unclear Pap test results.  Other health care providers may not recommend any screening for nonpregnant women who are considered low risk for pelvic cancer and who do not have symptoms. Ask your health care provider if a screening pelvic exam is right for you.  If you have had past treatment for cervical cancer or a condition that could lead to cancer, you need Pap tests and screening for cancer for at least 20 years after your treatment. If Pap tests have been discontinued, your risk factors (such as having a new sexual partner) need to be reassessed to determine if screening should resume. Some women have medical problems that increase the chance of getting cervical cancer. In these cases, your health care provider may recommend more frequent screening and Pap tests. Colorectal Cancer  This type of cancer can be detected and often prevented.  Routine colorectal cancer screening usually begins at 77 years of age and continues through 77 years of age.  Your health care provider may recommend screening at an earlier age if you have risk factors for colon cancer.  Your health care provider may also recommend using home test kits to check for hidden blood in the stool.  A small camera at the end of a tube can be used to examine your colon directly (sigmoidoscopy or colonoscopy). This is done to check for the earliest forms of colorectal cancer.  Routine screening usually begins at age 30.  Direct examination of the colon should be repeated every 5-10 years through 77 years of age. However, you may need to be screened more often if early forms of precancerous polyps or small growths are found. Skin Cancer  Check your skin from head to toe regularly.  Tell your health care provider about any new  moles or changes in moles, especially if there is a change in a mole's shape or color.  Also tell your health care provider if you have a mole that is larger than the size of a pencil eraser.  Always use sunscreen. Apply sunscreen liberally and repeatedly throughout the day.  Protect yourself by wearing long sleeves, pants, a wide-brimmed hat, and sunglasses whenever you are outside. Heart disease, diabetes, and high blood pressure  High blood pressure causes heart disease and increases the risk of stroke. High blood pressure is more likely to develop in:  People who have blood pressure in the high end of the normal range (130-139/85-89 mm Hg).  People who are overweight or obese.  People who are African American.  If you are 9-72 years of age, have your blood pressure checked every 3-5 years. If you are 77 years of age or older, have your blood pressure checked every year. You should have your blood pressure measured twice-once when you are at a hospital or clinic, and once when you are not at a hospital or clinic. Record the average of the two measurements. To check your blood pressure when you are not at a hospital or clinic, you can use:  An automated blood pressure machine at a pharmacy.  A home blood pressure monitor.  If you are between  16 years and 17 years old, ask your health care provider if you should take aspirin to prevent strokes.  Have regular diabetes screenings. This involves taking a blood sample to check your fasting blood sugar level.  If you are at a normal weight and have a low risk for diabetes, have this test once every three years after 77 years of age.  If you are overweight and have a high risk for diabetes, consider being tested at a younger age or more often. Preventing infection Hepatitis B  If you have a higher risk for hepatitis B, you should be screened for this virus. You are considered at high risk for hepatitis B if:  You were born in a country  where hepatitis B is common. Ask your health care provider which countries are considered high risk.  Your parents were born in a high-risk country, and you have not been immunized against hepatitis B (hepatitis B vaccine).  You have HIV or AIDS.  You use needles to inject street drugs.  You live with someone who has hepatitis B.  You have had sex with someone who has hepatitis B.  You get hemodialysis treatment.  You take certain medicines for conditions, including cancer, organ transplantation, and autoimmune conditions. Hepatitis C  Blood testing is recommended for:  Everyone born from [birth year] through 1967.  Anyone with known risk factors for hepatitis C. Sexually transmitted infections (STIs)  You should be screened for sexually transmitted infections (STIs) including gonorrhea and chlamydia if:  You are sexually active and are younger than 77 years of age.  You are older than 77 years of age and your health care provider tells you that you are at risk for this type of infection.  Your sexual activity has changed since you were last screened and you are at an increased risk for chlamydia or gonorrhea. Ask your health care provider if you are at risk.  If you do not have HIV, but are at risk, it may be recommended that you take a prescription medicine daily to prevent HIV infection. This is called pre-exposure prophylaxis (PrEP). You are considered at risk if:  You are sexually active and do not regularly use condoms or know the HIV status of your partner(s).  You take drugs by injection.  You are sexually active with a partner who has HIV. Talk with your health care provider about whether you are at high risk of being infected with HIV. If you choose to begin PrEP, you should first be tested for HIV. You should then be tested every 3 months for as long as you are taking PrEP. Pregnancy  If you are premenopausal and you may become pregnant, ask your health care provider  about preconception counseling.  If you may become pregnant, take 400 to 800 micrograms (mcg) of folic acid every day.  If you want to prevent pregnancy, talk to your health care provider about birth control (contraception). Osteoporosis and menopause  Osteoporosis is a disease in which the bones lose minerals and strength with aging. This can result in serious bone fractures. Your risk for osteoporosis can be identified using a bone density scan.  If you are 57 years of age or older, or if you are at risk for osteoporosis and fractures, ask your health care provider if you should be screened.  Ask your health care provider whether you should take a calcium or vitamin D supplement to lower your risk for osteoporosis.  Menopause may have certain physical symptoms  and risks.  Hormone replacement therapy may reduce some of these symptoms and risks. Talk to your health care provider about whether hormone replacement therapy is right for you. Follow these instructions at home:  Schedule regular health, dental, and eye exams.  Stay current with your immunizations.  Do not use any tobacco products including cigarettes, chewing tobacco, or electronic cigarettes.  If you are pregnant, do not drink alcohol.  If you are breastfeeding, limit how much and how often you drink alcohol.  Limit alcohol intake to no more than 1 drink per day for nonpregnant women. One drink equals 12 ounces of beer, 5 ounces of wine, or 1 ounces of hard liquor.  Do not use street drugs.  Do not share needles.  Ask your health care provider for help if you need support or information about quitting drugs.  Tell your health care provider if you often feel depressed.  Tell your health care provider if you have ever been abused or do not feel safe at home. This information is not intended to replace advice given to you by your health care provider. Make sure you discuss any questions you have with your health care  provider. Document Released: 02/10/2011 Document Revised: 01/03/2016 Document Reviewed: 05/01/2015  2017 [employer]

## 2016-07-25 NOTE — Progress Notes (Signed)
I have reviewed and agree with note, evaluation, plan.   Stephen Hunter, MD  

## 2016-08-01 ENCOUNTER — Other Ambulatory Visit: Payer: Medicare Other

## 2016-08-01 ENCOUNTER — Ambulatory Visit: Payer: Medicare Other

## 2016-08-25 ENCOUNTER — Other Ambulatory Visit: Payer: Self-pay

## 2016-08-25 MED ORDER — PANTOPRAZOLE SODIUM 40 MG PO TBEC: DELAYED_RELEASE_TABLET | ORAL | 0 refills | Status: DC

## 2016-08-25 NOTE — Telephone Encounter (Signed)
Pantoprazole sent to [facility] in [place] as patient requested.

## 2016-09-01 ENCOUNTER — Other Ambulatory Visit: Payer: Self-pay

## 2016-09-01 MED ORDER — IRBESARTAN 300 MG PO TABS: 300.0000 mg | ORAL_TABLET | Freq: Every day | ORAL | 1 refills | Status: DC

## 2016-09-01 MED ORDER — ESCITALOPRAM OXALATE 20 MG PO TABS: 20.0000 mg | ORAL_TABLET | Freq: Every day | ORAL | 3 refills | Status: DC

## 2016-09-01 MED ORDER — HYDROCHLOROTHIAZIDE 25 MG PO TABS: 25.0000 mg | ORAL_TABLET | Freq: Every day | ORAL | 1 refills | Status: DC

## 2016-09-01 MED ORDER — DILTIAZEM HCL ER COATED BEADS 240 MG PO CP24: 240.0000 mg | ORAL_CAPSULE | Freq: Every day | ORAL | 2 refills | Status: DC

## 2016-09-10 ENCOUNTER — Other Ambulatory Visit: Payer: Self-pay

## 2016-09-10 MED ORDER — ESCITALOPRAM OXALATE 20 MG PO TABS: 20.0000 mg | ORAL_TABLET | Freq: Every day | ORAL | 3 refills | Status: DC

## 2016-09-10 MED ORDER — DILTIAZEM HCL ER COATED BEADS 240 MG PO CP24: 240.0000 mg | ORAL_CAPSULE | Freq: Every day | ORAL | 2 refills | Status: DC

## 2016-09-10 MED ORDER — IRBESARTAN 300 MG PO TABS: 300.0000 mg | ORAL_TABLET | Freq: Every day | ORAL | 1 refills | Status: DC

## 2016-09-10 MED ORDER — HYDROCHLOROTHIAZIDE 25 MG PO TABS: 25.0000 mg | ORAL_TABLET | Freq: Every day | ORAL | 1 refills | Status: DC

## 2016-11-19 ENCOUNTER — Other Ambulatory Visit: Payer: Self-pay | Admitting: Internal Medicine

## 2017-01-26 ENCOUNTER — Telehealth: Payer: Self-pay | Admitting: Internal Medicine

## 2017-01-26 NOTE — Telephone Encounter (Signed)
Patient with lower abdominal pain.  She will come in tomorrow and see Dr. Carlean Purl at 10:15

## 2017-01-27 ENCOUNTER — Encounter: Payer: Self-pay | Admitting: Internal Medicine

## 2017-01-27 ENCOUNTER — Encounter (INDEPENDENT_AMBULATORY_CARE_PROVIDER_SITE_OTHER): Payer: Self-pay

## 2017-01-27 ENCOUNTER — Ambulatory Visit (INDEPENDENT_AMBULATORY_CARE_PROVIDER_SITE_OTHER): Payer: Medicare Other | Admitting: Internal Medicine

## 2017-01-27 VITALS — BP 120/60 | HR 80 | Ht 59.75 in | Wt 127.6 lb

## 2017-01-27 DIAGNOSIS — R1031 Right lower quadrant pain: Secondary | ICD-10-CM | POA: Diagnosis not present

## 2017-01-27 DIAGNOSIS — K5730 Diverticulosis of large intestine without perforation or abscess without bleeding: Secondary | ICD-10-CM

## 2017-01-27 DIAGNOSIS — R1032 Left lower quadrant pain: Secondary | ICD-10-CM

## 2017-01-27 MED ORDER — AMOXICILLIN-POT CLAVULANATE 875-125 MG PO TABS: 1.0000 | ORAL_TABLET | Freq: Two times a day (BID) | ORAL | 0 refills | Status: DC

## 2017-01-27 NOTE — Patient Instructions (Addendum)
We have sent the following medications to your pharmacy for you to pick up at your convenience: Augmentin twice daily x 10 days   Call and let us know how your abdominal pain is doing after your Augmentin course has been completed.  If you are age 78 or older, your body mass index should be between 23-30. Your Body mass index is 25.13 kg/m. If this is out of the aforementioned range listed, please consider follow up with your Primary Care Provider.  If you are age 102 or younger, your body mass index should be between 19-25. Your Body mass index is 25.13 kg/m. If this is out of the aformentioned range listed, please consider follow up with your Primary Care Provider.   I appreciate the opportunity to care for you. [name], MD, [facility]

## 2017-01-27 NOTE — Progress Notes (Signed)
[name] 76 y.o. [birth year] [id number]  Assessment & Plan:   Encounter Diagnoses  Name Primary?  . Bilateral lower abdominal pain Yes  . Diverticulosis of colon without hemorrhage - ? diverticulitis    Not clear whether this was a flare of IBS, diverticulitis , or if there was some transient non-occlusive colonic bowel ischemia. She seems okay now, just frustrated with persistent lower abdominal pain, but will try a round of Augmentin to see if that helps. Instructed her to call or send a message when she finishes antibiotics to let us know if they helped. If so - then could conclude diverticulitis more likely - especially if prolonged symptom free interval. If not - consider repeat imaging vs longer term tx to alter nosciception.  F/u PRN    Subjective:   Chief Complaint: [name] is a 76 year old female with hx of IBS and diverticulitis who presents with "abdominal pain like labor since Thursday" She describes an episode of "extreme pain" on Sunday, for which she "took to to the couch for the day" and described it as more severe pain than past episodes of IBS/diverticulitis. Associated with some nausea and one "blow out" of non bloody diarrhea yesterday. Denies fever, chills, vomiting. Reports that the acute pain and diarrhea has improved but she has persistent achy pain in her lower abdomen/pelvis. She has been switching between advil and tylenol for pain, which helps "some" with the pain at night. Not taking anything for diarrhea.   She does report feeling sx of rectocele during bowel movements, but denies urinary symptoms and says her GYN evaluated her for pelvic pain but did not notice uterine or bladder prolapse. Said "it's not a GYN problem"  ROS Constitutional: negative for fever, chills GI: + Abdominal pain, diarrhea, rectal pressure with BM. Negative for nausea, vomiting, blood in stool, abdominal distention.  GU: Neg for dysuria or splinting.  All other ROS  negative   Allergies  Allergen Reactions  . Codeine Nausea Only   Current Meds  Medication Sig  . acetaminophen (TYLENOL) 325 MG tablet Take 650 mg by mouth every 6 (six) hours as needed for moderate pain.  [name] ALPRAZolam (XANAX) 0.25 MG tablet TAKE 1 TABLET BY MOUTH EVERY DAY AS NEEDED  . BIOTIN PO Take 1 capsule by mouth daily.   . Calcium Carbonate-Vitamin D (CALCIUM + D PO) Take 1 tablet by mouth daily.    [name] diltiazem (CARTIA XT) 240 MG 24 hr capsule Take 1 capsule (240 mg total) by mouth daily.  [name] escitalopram (LEXAPRO) 20 MG tablet Take 1 tablet (20 mg total) by mouth daily.  . hydrochlorothiazide (HYDRODIURIL) 25 MG tablet Take 1 tablet (25 mg total) by mouth daily.  . irbesartan (AVAPRO) 300 MG tablet Take 1 tablet (300 mg total) by mouth daily.  . pantoprazole (PROTONIX) 40 MG tablet TAKE 1 TABLET BY MOUTH EVERY MORNING BEFORE BREAKFAST  . timolol (TIMOPTIC) 0.5 % ophthalmic solution Place 1 drop into both eyes every morning.    Past Medical History:  Diagnosis Date  . Adenomatous colon polyp 2006  . Anxiety   . Arthritis   . Benign esophageal stricture 10/14/2012  . CERUMEN IMPACTION, BILATERAL 12/13/2007  . CERVICAL RADICULOPATHY 11/05/2009  . Chronic kidney disease    kidney stones  . DEPRESSION 03/11/2007  . DIVERTICULOSIS, COLON 03/11/2007  . Esophagitis, reflux 10/14/2012  . GERD (gastroesophageal reflux disease) 10/14/2012  . Glaucoma   . 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  Hearing loss   . HEMORRHOIDS, EXTERNAL W/O COMPLICATION 8/52/7782   Qualifier: Diagnosis of  By: Jimmye Norman, LPN, Winfield Cunas   . HYPERLIPIDEMIA 09/22/2007  . HYPERTENSION 03/11/2007  . Kidney stone   . Osteopenia 02/2015   T score -1.4 FRAX 7.3%/0.6%  . Rectocele   . Rosacea 03/11/2007  . Tachycardia    Past Surgical History:  Procedure Laterality Date  . ANTERIOR CERVICAL DECOMP/DISCECTOMY FUSION  08/14/2011   Procedure: ANTERIOR CERVICAL DECOMPRESSION/DISCECTOMY FUSION 1  LEVEL/HARDWARE REMOVAL;  Surgeon: [name], MD;  Location: [facility];  Service: Neurosurgery;  Laterality: N/A;  Cervical Six-Seven anterior cervical decompression with fusion interbody prothesis plating and bonegraft with removal of hardware of Cervical Four to Cervical Six   . CERVICAL FUSION     C4-6  . COLONOSCOPY     multiple  . ESOPHAGOGASTRODUODENOSCOPY  10/14/2012   Dr. [name]  . KIDNEY SURGERY     age 64  . TONSILLECTOMY     Social History   Social History  . Marital status: Widowed    Spouse name: N/A  . Number of children: N/A  . Years of education: N/A   Occupational History  . retired Retired   Social History Main Topics  . Smoking status: Never Smoker  . Smokeless tobacco: Never Used  . Alcohol use No  . Drug use: No  . Sexual activity: No     Comment: 1st intercourse 76 yo-Fewer than 5 partners   Other Topics Concern  . Not on file   Social History Narrative   Lives with grown son. Widowed. 5 children and 7 grandchildren.       Retired from [employer] after 5 years and later took care of children.       Hobbies: time with grandkids, shopping, go out to eat. Travels once a year with girlfriends.    family history includes Cerebral palsy in her sister; Diabetes in her maternal grandmother; Glaucoma in her mother; Heart disease in her father; Hypertension in her father; Lung cancer in her father; Mental illness in her mother; Pancreatic cancer in her daughter; Stroke in her paternal aunt, paternal aunt, paternal uncle, paternal uncle, paternal uncle, and sister.   Objective:   Physical Exam @BP  120/60   Pulse 80   Ht 4' 11.75" (1.518 m)   Wt 127 lb 9.6 oz (57.9 kg)   LMP 08/12/1991   BMI 25.13 kg/m @  General:  Well-developed, well-nourished and in no acute distress Eyes:  anicteric. ENT:   Mouth and posterior pharynx free of lesions.  Neck:   supple w/o thyromegaly or mass.  Lungs: Clear to auscultation bilaterally. Heart:  S1S2, no rubs,  murmurs, gallops. Abdomen:  TTP bilaterally, lower abdomen. soft, no hepatosplenomegaly, hernia, or mass and BS+.  Rectal: Not done Lymph:  no cervical or supraclavicular adenopathy. Extremities:   no edema, cyanosis or clubbing Skin   no rash. Neuro:  A&O x 3.  Psych:  appropriate mood and  Affect.   Data Reviewed: Prior notes reviewed   Janeece Riggers, PA-S

## 2017-01-28 ENCOUNTER — Telehealth: Payer: Self-pay | Admitting: Family Medicine

## 2017-01-30 ENCOUNTER — Other Ambulatory Visit: Payer: Self-pay

## 2017-01-30 MED ORDER — ALPRAZOLAM 0.25 MG PO TABS: 0.2500 mg | ORAL_TABLET | Freq: Every day | ORAL | 0 refills | Status: DC | PRN

## 2017-01-30 NOTE — Telephone Encounter (Signed)
Prescription faxed to the pharmacy.

## 2017-01-30 NOTE — Telephone Encounter (Signed)
Pt request refill  ALPRAZolam (XANAX) 0.25 MG tablet  Pt not seen since 07/2016.  Advised she might need appt.  Pt states she will wait to hear one way or another.  Ok to leave voice mail  [id number] - [place], [place] - [address]

## 2017-02-12 ENCOUNTER — Telehealth: Payer: Self-pay | Admitting: Internal Medicine

## 2017-02-12 DIAGNOSIS — R1030 Lower abdominal pain, unspecified: Secondary | ICD-10-CM

## 2017-02-12 NOTE — Telephone Encounter (Signed)
Patient reports that she is having continued lower abdominal pain.  Across "my whole stomach above the hair line, it feels heavy". The augmentin helped with "excruciating pain" but the pressure is always there. She is calling back with an update as asked after completing augmentin.

## 2017-02-12 NOTE — Telephone Encounter (Signed)
Please ask her to do CT abd/pelvis with contrast Will need BUN/creat   Refill the Augmentin and take another course   Dx: lower abdominal pain and lower abdominal tenderness with hx diverticulitis

## 2017-02-13 MED ORDER — AMOXICILLIN-POT CLAVULANATE 875-125 MG PO TABS: 1.0000 | ORAL_TABLET | Freq: Two times a day (BID) | ORAL | 0 refills | Status: DC

## 2017-02-13 NOTE — Telephone Encounter (Signed)
Patient has been scheduled for CT 02/19/17 11:30.  She will come on Monday and pick up contrast and instructions and do labs. She verbalized understanding to go to the pharmacy to pick up her antibiotics.

## 2017-02-15 ENCOUNTER — Other Ambulatory Visit: Payer: Self-pay | Admitting: Internal Medicine

## 2017-02-16 ENCOUNTER — Other Ambulatory Visit (INDEPENDENT_AMBULATORY_CARE_PROVIDER_SITE_OTHER): Payer: Medicare Other

## 2017-02-16 DIAGNOSIS — R1030 Lower abdominal pain, unspecified: Secondary | ICD-10-CM | POA: Diagnosis not present

## 2017-02-16 LAB — BUN: BUN: 12 mg/dL (ref 6–23)

## 2017-02-16 LAB — CREATININE, SERUM: CREATININE: 0.85 mg/dL (ref 0.40–1.20)

## 2017-02-19 ENCOUNTER — Ambulatory Visit (INDEPENDENT_AMBULATORY_CARE_PROVIDER_SITE_OTHER)
Admission: RE | Admit: 2017-02-19 | Discharge: 2017-02-19 | Disposition: A | Discharge status: Home / Self Care | Payer: Medicare Other | Source: Ambulatory Visit | Attending: Internal Medicine | Admitting: Internal Medicine

## 2017-02-19 DIAGNOSIS — R1030 Lower abdominal pain, unspecified: Secondary | ICD-10-CM | POA: Diagnosis not present

## 2017-02-19 MED ORDER — IOPAMIDOL (ISOVUE-300) INJECTION 61%
100.0000 mL | Freq: Once | INTRAVENOUS | Status: AC | PRN
  Administered 2017-02-19: 100 mL via INTRAVENOUS

## 2017-02-20 ENCOUNTER — Other Ambulatory Visit: Payer: Self-pay

## 2017-02-20 MED ORDER — DICYCLOMINE HCL 10 MG PO CAPS: ORAL_CAPSULE | ORAL | 1 refills | Status: DC

## 2017-02-20 NOTE — Progress Notes (Signed)
OK - let's try 10 mg dicyclomine every 6 hrs prn abdominal pressure - # 90 1 refill Also would do a probiotic like align daily x 1 month  Would make her a f/u me next available also  Call back sooner prn - I would like her to know while I know she feels bad - sounds better and do not think anything dangerous going on

## 2017-02-20 NOTE — Progress Notes (Signed)
CT scan does not show diverticulitis now. How is she?

## 2017-03-12 ENCOUNTER — Other Ambulatory Visit: Payer: Self-pay | Admitting: Family Medicine

## 2017-03-23 LAB — HM MAMMOGRAPHY

## 2017-03-25 ENCOUNTER — Encounter: Payer: Self-pay | Admitting: Family Medicine

## 2017-04-16 ENCOUNTER — Encounter: Payer: Self-pay | Admitting: Internal Medicine

## 2017-04-16 ENCOUNTER — Ambulatory Visit (INDEPENDENT_AMBULATORY_CARE_PROVIDER_SITE_OTHER): Payer: Medicare Other | Admitting: Internal Medicine

## 2017-04-16 VITALS — BP 110/70 | HR 72 | Ht 59.75 in | Wt 126.2 lb

## 2017-04-16 DIAGNOSIS — K219 Gastro-esophageal reflux disease without esophagitis: Secondary | ICD-10-CM

## 2017-04-16 DIAGNOSIS — K580 Irritable bowel syndrome with diarrhea: Secondary | ICD-10-CM | POA: Diagnosis not present

## 2017-04-16 DIAGNOSIS — R1031 Right lower quadrant pain: Secondary | ICD-10-CM

## 2017-04-16 DIAGNOSIS — K222 Esophageal obstruction: Secondary | ICD-10-CM | POA: Diagnosis not present

## 2017-04-16 DIAGNOSIS — R1032 Left lower quadrant pain: Secondary | ICD-10-CM

## 2017-04-16 DIAGNOSIS — K5730 Diverticulosis of large intestine without perforation or abscess without bleeding: Secondary | ICD-10-CM

## 2017-04-16 DIAGNOSIS — G8929 Other chronic pain: Secondary | ICD-10-CM

## 2017-04-16 NOTE — Progress Notes (Signed)
[name] 76 y.o. [birth year] [id number]  Assessment & Plan:   Encounter Diagnoses  Name Primary?  . Chronic bilateral lower abdominal pain Yes  . Diverticulosis of colon without hemorrhage   . Irritable bowel syndrome with diarrhea   . GERD with stricture    I think she has symptomatic diverticulosis plus or minus IBS. Stop dicyclomine (glaucoma and ineffective) Try IB Gard for pain Stay on PPI reduce chances of recurrent esophageal stricture in the setting of GERD RTC prn, she seems reassured today and at least less bothered by this Call back with an update re: IB Donald Prose and its effectiveness   I appreciate the opportunity to care for this patient. CC: Courtney Olp, MD   Subjective:   Chief Complaint:Abdominal pain  HPI [name] is here for follow-up, I've been trying to help her with bilateral lower quadrant abdominal pain that comes and goes. CT scanning showed persistent severe diverticulosis but no diverticulitis. I cycle mean has been tried but does not work. He still gets episodic urgent diarrhea for no reason area fortunately no recent incontinence. She says maybe I'll just have to live with this pain. She does not have any urinary symptoms and no back pain other than a mild ache occasionally. Overall she seems to be tolerating her symptoms better. She questions if she needs to continue PPI. He does not have heartburn or dysphagia. Allergies  Allergen Reactions  . Codeine Nausea Only   Current Meds  Medication Sig  . acetaminophen (TYLENOL) 325 MG tablet Take 650 mg by mouth every 6 (six) hours as needed for moderate pain.  [name] ALPRAZolam (XANAX) 0.25 MG tablet Take 1 tablet (0.25 mg total) by mouth daily as needed.  [name] BIOTIN PO Take 1 capsule by mouth daily.   . Calcium Carbonate-Vitamin D (CALCIUM + D PO) Take 1 tablet by mouth daily.    [name] diltiazem (CARTIA XT) 240 MG 24 hr capsule Take 1 capsule (240 mg total) by mouth daily.  [name] escitalopram (LEXAPRO)  20 MG tablet Take 1 tablet (20 mg total) by mouth daily.  . hydrochlorothiazide (HYDRODIURIL) 25 MG tablet Take 1 tablet (25 mg total) by mouth daily.  . irbesartan (AVAPRO) 300 MG tablet TAKE 1 TABLET (300 MG TOTAL) BY MOUTH DAILY.  . pantoprazole (PROTONIX) 40 MG tablet TAKE 1 TABLET BY MOUTH EVERY MORNING BEFORE BREAKFAST  . timolol (TIMOPTIC) 0.5 % ophthalmic solution Place 1 drop into both eyes 2 (two) times daily.   . [DISCONTINUED] amoxicillin-clavulanate (AUGMENTIN) 875-125 MG tablet Take 1 tablet by mouth 2 (two) times daily.  . [DISCONTINUED] dicyclomine (BENTYL) 10 MG capsule Take one a capsule every 6 hours as needed for abdominal pressure   Past Medical History:  Diagnosis Date  . Adenomatous colon polyp 2006  . Anxiety   . Arthritis   . Benign esophageal stricture 10/14/2012  . CERUMEN IMPACTION, BILATERAL 12/13/2007  . CERVICAL RADICULOPATHY 11/05/2009  . Chronic kidney disease    kidney stones  . DEPRESSION 03/11/2007  . DIVERTICULOSIS, COLON 03/11/2007  . Esophagitis, reflux 10/14/2012  . GERD (gastroesophageal reflux disease) 10/14/2012  . Glaucoma   . 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 Hearing loss   . HEMORRHOIDS, EXTERNAL W/O COMPLICATION 0/62/6948   Qualifier: Diagnosis of  By: Jimmye Norman, LPN, Winfield Cunas   . HYPERLIPIDEMIA 09/22/2007  . HYPERTENSION 03/11/2007  . Kidney stone   . Osteopenia 02/2015   T score -1.4 FRAX 7.3%/0.6%  . Rectocele   .  Rosacea 03/11/2007  . Tachycardia    Past Surgical History:  Procedure Laterality Date  . ANTERIOR CERVICAL DECOMP/DISCECTOMY FUSION  08/14/2011   Procedure: ANTERIOR CERVICAL DECOMPRESSION/DISCECTOMY FUSION 1 LEVEL/HARDWARE REMOVAL;  Surgeon: [name], MD;  Location: [facility];  Service: Neurosurgery;  Laterality: N/A;  Cervical Six-Seven anterior cervical decompression with fusion interbody prothesis plating and bonegraft with removal of hardware of Cervical Four to Cervical Six   . CERVICAL FUSION       C4-6  . COLONOSCOPY     multiple  . ESOPHAGOGASTRODUODENOSCOPY  10/14/2012   Dr. [name]  . KIDNEY SURGERY     age 76  . TONSILLECTOMY    Review of Systems  As per history of present illness Objective:   Physical Exam BP 110/70   Pulse 72   Ht 4' 11.75" (1.518 m)   Wt 126 lb 4 oz (57.3 kg)   LMP 08/12/1991   BMI 24.86 kg/m  abd soft and NT

## 2017-04-16 NOTE — Patient Instructions (Addendum)
Stop Dicyclomine.  IB gard, 2 pills before breakfast and 2 pills before supper. We have given you some coupons today.   Call back after completion of the first box and let us know how its working!  Thank you for choosing me and [facility].  Gatha Mayer, M.D., [facility]

## 2017-04-25 ENCOUNTER — Other Ambulatory Visit: Payer: Self-pay | Admitting: Internal Medicine

## 2017-04-30 ENCOUNTER — Telehealth: Payer: Self-pay | Admitting: Internal Medicine

## 2017-04-30 NOTE — Telephone Encounter (Signed)
Patient reports that she took IBGard and while it helps with the abdominal pain, she is now constipated.  She has some lower abdominal pressure "But that maybe normal for my age".  Please advise

## 2017-05-01 NOTE — Telephone Encounter (Signed)
She can try only taking 1 capsule at a time or maybe just 2 in that AM  Also would add benefiber 1 tablespoon daily - increase to 2 tablespoons if needed  MiraLax daily is next step if

## 2017-05-01 NOTE — Telephone Encounter (Signed)
Patient notified of recommendations. 

## 2017-05-16 ENCOUNTER — Other Ambulatory Visit: Payer: Self-pay | Admitting: Internal Medicine

## 2017-05-29 ENCOUNTER — Other Ambulatory Visit: Payer: Self-pay

## 2017-05-29 MED ORDER — DILTIAZEM HCL ER COATED BEADS 240 MG PO CP24: 240.0000 mg | ORAL_CAPSULE | Freq: Every day | ORAL | 2 refills | Status: DC

## 2017-08-14 ENCOUNTER — Other Ambulatory Visit: Payer: Self-pay | Admitting: Internal Medicine

## 2017-08-21 ENCOUNTER — Telehealth: Payer: Self-pay | Admitting: Family Medicine

## 2017-08-21 NOTE — Telephone Encounter (Signed)
Pt needing refill on Alprazolam.

## 2017-08-21 NOTE — Telephone Encounter (Signed)
MEDICATION: ALPRAZolam (XANAX) 0.25 MG tablet  PHARMACY:   [id number] - [place], [place] - [address] [phone] (Phone) [phone] (Fax)     IS THIS A 90 DAY SUPPLY :  n IS PATIENT OUT OF MEDICATION:   IF NOT; HOW MUCH IS LEFT:   LAST APPOINTMENT DATE: @120417   NEXT APPOINTMENT DATE:@Visit  date not found  OTHER COMMENTS:    **Let patient know to contact pharmacy at the end of the day to make sure medication is ready. **  ** Please notify patient to allow 48-72 hours to process**  **Encourage patient to contact the pharmacy for refills or they can request refills through [facility]**

## 2017-08-22 NOTE — Telephone Encounter (Signed)
Yes thanks, may refill #30

## 2017-08-24 ENCOUNTER — Other Ambulatory Visit: Payer: Self-pay

## 2017-08-24 ENCOUNTER — Encounter: Payer: Self-pay | Admitting: Family Medicine

## 2017-08-24 ENCOUNTER — Ambulatory Visit (INDEPENDENT_AMBULATORY_CARE_PROVIDER_SITE_OTHER): Payer: Medicare Other | Admitting: Family Medicine

## 2017-08-24 VITALS — BP 122/68 | HR 75 | Temp 98.2°F | Ht 59.75 in | Wt 126.0 lb

## 2017-08-24 DIAGNOSIS — K219 Gastro-esophageal reflux disease without esophagitis: Secondary | ICD-10-CM

## 2017-08-24 DIAGNOSIS — R739 Hyperglycemia, unspecified: Secondary | ICD-10-CM

## 2017-08-24 DIAGNOSIS — F3341 Major depressive disorder, recurrent, in partial remission: Secondary | ICD-10-CM

## 2017-08-24 DIAGNOSIS — I471 Supraventricular tachycardia: Secondary | ICD-10-CM | POA: Diagnosis not present

## 2017-08-24 DIAGNOSIS — I10 Essential (primary) hypertension: Secondary | ICD-10-CM | POA: Diagnosis not present

## 2017-08-24 MED ORDER — ALPRAZOLAM 0.25 MG PO TABS: 0.2500 mg | ORAL_TABLET | Freq: Every day | ORAL | 0 refills | Status: DC | PRN

## 2017-08-24 NOTE — Assessment & Plan Note (Signed)
On last cbg- also get a1c.

## 2017-08-24 NOTE — Assessment & Plan Note (Signed)
S: well controlled on lexapro 20mg  in the past. Sparing xanax in the past- we refilled xanax #30 earlier in January. Locks her meds, keys, and money up due to having son in home that uses drugs.   Daughter with pancreatic cancer - huge stressor- metastasis unfortunately as well.  A/P: PHQ9 elevated at 8 but most of her issues are situational including son with drug issues and daughter with pancreatic cancer (also has a grandson using drugs). Uses her [facility] Ridge as a support. We discussed changing medications or adding counseling- she declines both at this time. Advised 6 month follow up to check in

## 2017-08-24 NOTE — Progress Notes (Signed)
Subjective:  [name] is a 77 y.o. year old very pleasant female patient who presents for/with See problem oriented charting ROS- no chest pain or shortness of breath. Rare palpitations. No SI. Some depressed mood- sad about how daughter is doing.    Past Medical History-  Patient Active Problem List   Diagnosis Date Noted  . SVT (supraventricular tachycardia) ([place]) 08/14/2011    Priority: High  . GERD (gastroesophageal reflux disease) 07/14/2014    Priority: Medium  . IBS (irritable bowel syndrome)- diarrhea predominant 01/26/2013    Priority: Medium  . Glaucoma associated with ocular disorder 11/05/2009    Priority: Medium  . Hyperlipidemia 09/22/2007    Priority: Medium  . Depression, major, recurrent, in partial remission ([place]). Lexapro 20mg . Xanax once a week.  03/11/2007    Priority: Medium  . Essential hypertension 03/11/2007    Priority: Medium  . History of colonic polyps 11/28/2011    Priority: Low  . Cervical disc disorder with radiculopathy of cervical region 11/05/2009    Priority: Low  . Rosacea 03/11/2007    Priority: Low  . Hyperglycemia 08/24/2017    Medications- reviewed and updated Current Outpatient Medications  Medication Sig Dispense Refill  . acetaminophen (TYLENOL) 325 MG tablet Take 650 mg by mouth every 6 (six) hours as needed for moderate pain.    [name] ALPRAZolam (XANAX) 0.25 MG tablet Take 1 tablet (0.25 mg total) by mouth daily as needed. 30 tablet 0  . BIOTIN PO Take 1 capsule by mouth daily.     . Calcium Carbonate-Vitamin D (CALCIUM + D PO) Take 1 tablet by mouth daily.      [name] diltiazem (CARTIA XT) 240 MG 24 hr capsule Take 1 capsule (240 mg total) by mouth daily. 90 capsule 2  . dorzolamide-timolol (COSOPT) 22.3-6.8 MG/ML ophthalmic solution Place 1 drop into both eyes 2 (two) times daily.    [name] escitalopram (LEXAPRO) 20 MG tablet Take 1 tablet (20 mg total) by mouth daily. 90 tablet 3  . hydrochlorothiazide (HYDRODIURIL) 25 MG tablet Take 1  tablet (25 mg total) by mouth daily. 90 tablet 1  . irbesartan (AVAPRO) 300 MG tablet TAKE 1 TABLET (300 MG TOTAL) BY MOUTH DAILY. 90 tablet 1  . pantoprazole (PROTONIX) 40 MG tablet TAKE 1 TABLET BY MOUTH EVERY MORNING BEFORE BREAKFAST 90 tablet 1   No current facility-administered medications for this visit.     Objective: BP 122/68 (BP Location: Left Arm, Patient Position: Sitting, Cuff Size: Large)   Pulse 75   Temp 98.2 F (36.8 C) (Oral)   Ht 4' 11.75" (1.518 m)   Wt 126 lb (57.2 kg)   LMP 08/12/1991   SpO2 98%   BMI 24.81 kg/m  Gen: NAD, resting comfortably CV: RRR no murmurs rubs or gallops Lungs: CTAB no crackles, wheeze, rhonchi Ext: no edema Skin: warm, dry  Assessment/Plan:  SVT (supraventricular tachycardia) (HCC) S: SVT has remained controlled on cardia 240mg  Xr. She is not interested in ablation. She last saw [name] 11/2014 and advised 1 year follow up A/P: She will continue on her cardia. I advised her to call to schedule follow up with [name]- she agrees to do so  Essential hypertension S: controlled on cardia 240mg  XR, hctz 25mg , irbesartan 300mg  BP Readings from Last 3 Encounters:  08/24/17 122/68  04/16/17 110/70  01/27/17 120/60  A/P: blood pressure goal of <140/90. Continue current meds  Depression, major, recurrent, in partial remission ([place]). Lexapro 20mg . Xanax once a  week.  S: well controlled on lexapro 20mg  in the past. Sparing xanax in the past- we refilled xanax #30 earlier in January. Locks her meds, keys, and money up due to having son in home that uses drugs.   Daughter with pancreatic cancer - huge stressor- metastasis unfortunately as well.  A/P: PHQ9 elevated at 8 but most of her issues are situational including son with drug issues and daughter with pancreatic cancer (also has a grandson using drugs). Uses her [employer] [employer] as a support. We discussed changing medications or adding counseling- she declines both at this time.  Advised 6 month follow up to check in  GERD (gastroesophageal reflux disease) S:  last visit on protonix and we advised trial of zantac (history of stroke and age)- she never tried this A/P: from avs "Trial zantac/ranitidine 150mg  twice a day for 2 weeks instead of pantoprazole- if doesn't work- you may switch back"  Hyperglycemia On last cbg- also get a1c.   Return in about 6 months (around 02/21/2018) for follow up and annual wellness visit.  Order associations SVT (supraventricular tachycardia) (HCC)  Essential hypertension - Plan: CBC, Comprehensive metabolic panel, Lipid panel  Hyperglycemia - Plan: Hemoglobin A1c  Return precautions advised.  Garret Reddish, MD

## 2017-08-24 NOTE — Assessment & Plan Note (Signed)
S: controlled on cardia 240mg  XR, hctz 25mg , irbesartan 300mg  BP Readings from Last 3 Encounters:  08/24/17 122/68  04/16/17 110/70  01/27/17 120/60  A/P: blood pressure goal of <140/90. Continue current meds

## 2017-08-24 NOTE — Assessment & Plan Note (Signed)
S: SVT has remained controlled on cardia 240mg  Xr. She is not interested in ablation. She last saw [name] 11/2014 and advised 1 year follow up A/P: She will continue on her cardia. I advised her to call to schedule follow up with [name]- she agrees to do so

## 2017-08-24 NOTE — Patient Instructions (Addendum)
Trial zantac/ranitidine 150mg  twice a day for 2 weeks instead of pantoprazole- if doesn't work- you may switch back  Schedule a lab visit at the check out desk within 2 weeks- fridays are fine. Return for future fasting labs meaning nothing but water after midnight please. Ok to take your medications with water.    I would like to check back in with you in 6 months- could also schedule an annual wellness visit at that time  Checking your blood sugar- you have been at high risk for diabetes so hopeful this has improved. If this was in diabetes range would have you back to discuss further.

## 2017-08-24 NOTE — Assessment & Plan Note (Signed)
S:  last visit on protonix and we advised trial of zantac (history of stroke and age)- she never tried this A/P: from avs "Trial zantac/ranitidine 150mg  twice a day for 2 weeks instead of pantoprazole- if doesn't work- you may switch back"

## 2017-08-24 NOTE — Telephone Encounter (Signed)
Prescription sent to pharmacy.

## 2017-08-28 ENCOUNTER — Other Ambulatory Visit: Payer: Self-pay

## 2017-08-28 MED ORDER — HYDROCHLOROTHIAZIDE 25 MG PO TABS: 25.0000 mg | ORAL_TABLET | Freq: Every day | ORAL | 1 refills | Status: DC

## 2017-08-31 ENCOUNTER — Other Ambulatory Visit (INDEPENDENT_AMBULATORY_CARE_PROVIDER_SITE_OTHER): Payer: Medicare Other

## 2017-08-31 DIAGNOSIS — I10 Essential (primary) hypertension: Secondary | ICD-10-CM

## 2017-08-31 DIAGNOSIS — R739 Hyperglycemia, unspecified: Secondary | ICD-10-CM | POA: Diagnosis not present

## 2017-08-31 LAB — LDL CHOLESTEROL, DIRECT: LDL DIRECT: 111 mg/dL

## 2017-08-31 LAB — COMPREHENSIVE METABOLIC PANEL
ALBUMIN: 4.2 g/dL (ref 3.5–5.2)
ALT: 13 U/L (ref 0–35)
AST: 11 U/L (ref 0–37)
Alkaline Phosphatase: 54 U/L (ref 39–117)
BILIRUBIN TOTAL: 0.5 mg/dL (ref 0.2–1.2)
BUN: 16 mg/dL (ref 6–23)
CALCIUM: 9.6 mg/dL (ref 8.4–10.5)
CHLORIDE: 100 meq/L (ref 96–112)
CO2: 34 mEq/L — ABNORMAL HIGH (ref 19–32)
CREATININE: 0.75 mg/dL (ref 0.40–1.20)
GFR: 79.3 mL/min (ref >60.00)
Glucose, Bld: 134 mg/dL — ABNORMAL HIGH (ref 70–99)
Potassium: 3.6 mEq/L (ref 3.5–5.1)
Sodium: 141 mEq/L (ref 135–145)
Total Protein: 6.7 g/dL (ref 6.0–8.3)

## 2017-08-31 LAB — CBC
HCT: 41.5 % (ref 36.0–46.0)
Hemoglobin: 13.9 g/dL (ref 12.0–15.0)
MCHC: 33.5 g/dL (ref 30.0–36.0)
MCV: 93.9 fl (ref 78.0–100.0)
Platelets: 274 10*3/uL (ref 150.0–400.0)
RBC: 4.42 Mil/uL (ref 3.87–5.11)
RDW: 12.9 % (ref 11.5–15.5)
WBC: 7.7 10*3/uL (ref 4.0–10.5)

## 2017-08-31 LAB — LIPID PANEL
CHOLESTEROL: 200 mg/dL (ref 0–200)
HDL: 31.1 mg/dL — ABNORMAL LOW (ref >39.00)
NonHDL: 169.28
Total CHOL/HDL Ratio: 6
Triglycerides: 295 mg/dL — ABNORMAL HIGH (ref 0.0–149.0)
VLDL: 59 mg/dL — AB (ref 0.0–40.0)

## 2017-08-31 LAB — HEMOGLOBIN A1C: HEMOGLOBIN A1C: 6.8 % — AB (ref 4.6–6.5)

## 2017-09-09 ENCOUNTER — Telehealth: Payer: Self-pay

## 2017-09-09 NOTE — Telephone Encounter (Signed)
Call to [name] and introduced her to the AWV. STates she has  A lot going on (dtr mets cancer) and invited her to come in for AWV when it is convenient her schedule her wellness at her next apt in 6 months.

## 2017-09-10 ENCOUNTER — Encounter: Payer: Self-pay | Admitting: Family Medicine

## 2017-09-10 ENCOUNTER — Ambulatory Visit (INDEPENDENT_AMBULATORY_CARE_PROVIDER_SITE_OTHER): Payer: Medicare Other | Admitting: Family Medicine

## 2017-09-10 VITALS — BP 110/62 | HR 61 | Temp 97.9°F | Ht 59.75 in | Wt 125.8 lb

## 2017-09-10 DIAGNOSIS — E785 Hyperlipidemia, unspecified: Secondary | ICD-10-CM

## 2017-09-10 DIAGNOSIS — R1032 Left lower quadrant pain: Secondary | ICD-10-CM

## 2017-09-10 DIAGNOSIS — R739 Hyperglycemia, unspecified: Secondary | ICD-10-CM | POA: Diagnosis not present

## 2017-09-10 DIAGNOSIS — G8929 Other chronic pain: Secondary | ICD-10-CM | POA: Diagnosis not present

## 2017-09-10 DIAGNOSIS — R1031 Right lower quadrant pain: Secondary | ICD-10-CM

## 2017-09-10 LAB — POC URINALSYSI DIPSTICK (AUTOMATED)
Bilirubin, UA: NEGATIVE
Blood, UA: NEGATIVE
Glucose, UA: NEGATIVE
Ketones, UA: NEGATIVE
Leukocytes, UA: NEGATIVE
Nitrite, UA: NEGATIVE
PROTEIN UA: NEGATIVE
SPEC GRAV UA: 1.015 (ref 1.010–1.025)
UROBILINOGEN UA: 0.2 U/dL
pH, UA: 6.5 (ref 5.0–8.0)

## 2017-09-10 NOTE — Patient Instructions (Addendum)
Take the IB gard 2 pills before breakfast and dinner as Dr. Carlean Purl suggested- go ahead and schedule follow up with him in perhaps 6 weeks.   Could also try the pelvic health physical therapy group with [facility]  I want you to get back to your daily walking to see if we can reduce your risk of diabetes (we are going to hold off on calling it diabetes unless you have another a1c above 6.5 at follow up). You declined metformin option to help with prevention.   If you do have diabetes we will need to try at least once a week cholesterol medicine as well- for now we are going to hold off as these medicines can slightly increase your diabetes risk

## 2017-09-10 NOTE — Assessment & Plan Note (Signed)
S: poorly controlled on no rx. No myalgias.  Lab Results  Component Value Date   CHOL 200 08/31/2017   HDL 31.10 (L) 08/31/2017   LDLCALC 82 10/10/2013   LDLDIRECT 111.0 08/31/2017   TRIG 295.0 (H) 08/31/2017   CHOLHDL 6 08/31/2017   A/P: we discussed possibly starting statin- she declines for now. Discussed if does have diagnosis of diabetes would need at least once a week statin at minimum.

## 2017-09-10 NOTE — Assessment & Plan Note (Signed)
S: patient had high CBG on last labs so we checked a1c which was elevated at 6.8. Patient regularly walks outside of the winter, eats high veggie diet, unsweet tea, rare soda- does think perhaps too many cabrs Lab Results  Component Value Date   HGBA1C 6.8 (H) 08/31/2017  A/P: we discussed a few options here- with CBG also 134 technically could diagnose diabetes- we opted to hold off and recheck in 3 months. If a1c 6.5 or above would diagnose. Discussed metformin option- she declines. She is going to increase her exercise and then recheck.

## 2017-09-10 NOTE — Progress Notes (Signed)
Subjective:  [name] is a 77 y.o. year old very pleasant female patient who presents for/with See problem oriented charting ROS- chronic lower abdominal pain without dysuria, polyuria, incontinence. No fever or chills. No chest pain or shortness of breath. No hypoglycemia.    Past Medical History-  Patient Active Problem List   Diagnosis Date Noted  . SVT (supraventricular tachycardia) ([place]) 08/14/2011    Priority: High  . GERD (gastroesophageal reflux disease) 07/14/2014    Priority: Medium  . IBS (irritable bowel syndrome)- diarrhea predominant 01/26/2013    Priority: Medium  . Glaucoma associated with ocular disorder 11/05/2009    Priority: Medium  . Hyperlipidemia 09/22/2007    Priority: Medium  . Depression, major, recurrent, in partial remission ([place]). Lexapro 20mg . Xanax once a week.  03/11/2007    Priority: Medium  . Essential hypertension 03/11/2007    Priority: Medium  . History of colonic polyps 11/28/2011    Priority: Low  . Cervical disc disorder with radiculopathy of cervical region 11/05/2009    Priority: Low  . Rosacea 03/11/2007    Priority: Low  . Hyperglycemia 08/24/2017    Medications- reviewed and updated Current Outpatient Medications  Medication Sig Dispense Refill  . acetaminophen (TYLENOL) 325 MG tablet Take 650 mg by mouth every 6 (six) hours as needed for moderate pain.    [name] ALPRAZolam (XANAX) 0.25 MG tablet Take 1 tablet (0.25 mg total) by mouth daily as needed. 30 tablet 0  . BIOTIN PO Take 1 capsule by mouth daily.     . Calcium Carbonate-Vitamin D (CALCIUM + D PO) Take 1 tablet by mouth daily.      [name] diltiazem (CARTIA XT) 240 MG 24 hr capsule Take 1 capsule (240 mg total) by mouth daily. 90 capsule 2  . dorzolamide-timolol (COSOPT) 22.3-6.8 MG/ML ophthalmic solution Place 1 drop into both eyes 2 (two) times daily.    [name] escitalopram (LEXAPRO) 20 MG tablet Take 1 tablet (20 mg total) by mouth daily. 90 tablet 3  . hydrochlorothiazide  (HYDRODIURIL) 25 MG tablet Take 1 tablet (25 mg total) by mouth daily. 90 tablet 1  . irbesartan (AVAPRO) 300 MG tablet TAKE 1 TABLET (300 MG TOTAL) BY MOUTH DAILY. 90 tablet 1  . pantoprazole (PROTONIX) 40 MG tablet TAKE 1 TABLET BY MOUTH EVERY MORNING BEFORE BREAKFAST 90 tablet 1  . vitamin B-12 (CYANOCOBALAMIN) 1000 MCG tablet Take 1,000 mcg by mouth daily.     No current facility-administered medications for this visit.     Objective: BP 110/62 (BP Location: Left Arm, Patient Position: Sitting, Cuff Size: Large)   Pulse 61   Temp 97.9 F (36.6 C) (Oral)   Ht 4' 11.75" (1.518 m)   Wt 125 lb 12.8 oz (57.1 kg)   LMP 08/12/1991   SpO2 95%   BMI 24.77 kg/m  Gen: NAD, resting comfortably CV: RRR no murmurs rubs or gallops Lungs: CTAB no crackles, wheeze, rhonchi Abdomen: soft/nontender except moderate lower abdominal tenderness/nondistended/normal bowel sounds. No rebound or guarding.  Ext: no edema  Assessment/Plan:  Chronic bilateral lower abdominal pain - Plan: POCT Urinalysis Dipstick (Automated), Urine Culture, POCT Urinalysis Dipstick (Automated) S: Patient for first time today tells me of chronic abdominal pain lower abdomen bilateral. Has seen Dr. Carlean Purl in past. Had CT within last 6 months which was largely reassuring other than diverticulitis. She states pain is like a constant "beginning of childbirth pain" but has not increased in last year. She has BM daily- uses ibgard 1  pill twice a day intermittent A/P: denies urinary symptoms but will get UA. Advised her to follow Dr. Celesta Aver direction of IB gard 2 doses BID for 1 month then follow up with him as well. I also gave her a handout fo breakthrough physical therapy pelvic health though she denies this as vaginal pain- I would be interested in them evaluating at minimum  Hyperglycemia S: patient had high CBG on last labs so we checked a1c which was elevated at 6.8. Patient regularly walks outside of the winter, eats high  veggie diet, unsweet tea, rare soda- does think perhaps too many cabrs Lab Results  Component Value Date   HGBA1C 6.8 (H) 08/31/2017  A/P: we discussed a few options here- with CBG also 134 technically could diagnose diabetes- we opted to hold off and recheck in 3 months. If a1c 6.5 or above would diagnose. Discussed metformin option- she declines. She is going to increase her exercise and then recheck.   Hyperlipidemia S: poorly controlled on no rx. No myalgias.  Lab Results  Component Value Date   CHOL 200 08/31/2017   HDL 31.10 (L) 08/31/2017   LDLCALC 82 10/10/2013   LDLDIRECT 111.0 08/31/2017   TRIG 295.0 (H) 08/31/2017   CHOLHDL 6 08/31/2017   A/P: we discussed possibly starting statin- she declines for now. Discussed if does have diagnosis of diabetes would need at least once a week statin at minimum.   Future Appointments  Date Time Provider [place]  10/12/2017 11:45 AM Allred, Jeneen Rinks, MD [facility]   Return in about 3 months (around 12/08/2017).  Lab/Order associations: Chronic bilateral lower abdominal pain - Plan: POCT Urinalysis Dipstick (Automated), Urine Culture, POCT Urinalysis Dipstick (Automated)  Return precautions advised.  Garret Reddish, MD

## 2017-09-12 LAB — URINE CULTURE
MICRO NUMBER:: 90133844
SPECIMEN QUALITY: ADEQUATE

## 2017-09-15 ENCOUNTER — Encounter: Payer: Self-pay | Admitting: Oncology

## 2017-09-15 ENCOUNTER — Other Ambulatory Visit: Payer: Self-pay | Admitting: Oncology

## 2017-10-08 ENCOUNTER — Encounter: Payer: Self-pay | Admitting: *Deleted

## 2017-10-12 ENCOUNTER — Encounter: Payer: Self-pay | Admitting: Internal Medicine

## 2017-10-12 ENCOUNTER — Ambulatory Visit (INDEPENDENT_AMBULATORY_CARE_PROVIDER_SITE_OTHER): Payer: Medicare Other | Admitting: Internal Medicine

## 2017-10-12 VITALS — BP 128/62 | HR 76 | Ht 61.0 in | Wt 123.0 lb

## 2017-10-12 DIAGNOSIS — I10 Essential (primary) hypertension: Secondary | ICD-10-CM

## 2017-10-12 DIAGNOSIS — I471 Supraventricular tachycardia: Secondary | ICD-10-CM | POA: Diagnosis not present

## 2017-10-12 NOTE — Patient Instructions (Signed)

## 2017-10-12 NOTE — Progress Notes (Signed)
PCP: [name], MD   Primary EP: Dr Stormy Fabian is a 76 y.o. female who presents today for routine electrophysiology followup.  Since last being seen in our clinic, the patient reports doing very well.  Today, she denies symptoms of palpitations, chest pain, shortness of breath,  lower extremity edema, dizziness, presyncope, or syncope.  The patient is otherwise without complaint today.   Past Medical History:  Diagnosis Date  . Adenomatous colon polyp 2006  . Anxiety   . Arthritis   . Benign esophageal stricture 10/14/2012  . CERUMEN IMPACTION, BILATERAL 12/13/2007  . CERVICAL RADICULOPATHY 11/05/2009  . Chronic kidney disease    kidney stones  . DEPRESSION 03/11/2007  . DIVERTICULOSIS, COLON 03/11/2007  . Esophagitis, reflux 10/14/2012  . GERD (gastroesophageal reflux disease) 10/14/2012  . Glaucoma   . 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 Hearing loss   . HEMORRHOIDS, EXTERNAL W/O COMPLICATION 12/05/621   Qualifier: Diagnosis of  By: Jimmye Norman, LPN, Winf[place] Cunas   . HYPERLIPIDEMIA 09/22/2007  . HYPERTENSION 03/11/2007  . Kidney stone   . Osteopenia 02/2015   T score -1.4 FRAX 7.3%/0.6%  . Rectocele   . Rosacea 03/11/2007  . Tachycardia    Past Surgical History:  Procedure Laterality Date  . ANTERIOR CERVICAL DECOMP/DISCECTOMY FUSION  08/14/2011   Procedure: ANTERIOR CERVICAL DECOMPRESSION/DISCECTOMY FUSION 1 LEVEL/HARDWARE REMOVAL;  Surgeon: [name], MD;  Location: [facility];  Service: Neurosurgery;  Laterality: N/A;  Cervical Six-Seven anterior cervical decompression with fusion interbody prothesis plating and bonegraft with removal of hardware of Cervical Four to Cervical Six   . CERVICAL FUSION     C4-6  . COLONOSCOPY     multiple  . ESOPHAGOGASTRODUODENOSCOPY  10/14/2012   Dr. [name]  . KIDNEY SURGERY     age 9  . TONSILLECTOMY      ROS- all systems are reviewed and negatives except as per HPI above  Current Outpatient  Medications  Medication Sig Dispense Refill  . acetaminophen (TYLENOL) 325 MG tablet Take 650 mg by mouth every 6 (six) hours as needed for moderate pain.    [name] ALPRAZolam (XANAX) 0.25 MG tablet Take 1 tablet (0.25 mg total) by mouth daily as needed. 30 tablet 0  . BIOTIN PO Take 1 capsule by mouth daily.     . Calcium Carbonate-Vitamin D (CALCIUM + D PO) Take 1 tablet by mouth daily.      [name] diltiazem (CARTIA XT) 240 MG 24 hr capsule Take 1 capsule (240 mg total) by mouth daily. 90 capsule 2  . dorzolamide-timolol (COSOPT) 22.3-6.8 MG/ML ophthalmic solution Place 1 drop into both eyes 2 (two) times daily.    [name] escitalopram (LEXAPRO) 20 MG tablet Take 1 tablet (20 mg total) by mouth daily. 90 tablet 3  . hydrochlorothiazide (HYDRODIURIL) 25 MG tablet Take 1 tablet (25 mg total) by mouth daily. 90 tablet 1  . irbesartan (AVAPRO) 300 MG tablet TAKE 1 TABLET (300 MG TOTAL) BY MOUTH DAILY. 90 tablet 1  . pantoprazole (PROTONIX) 40 MG tablet TAKE 1 TABLET BY MOUTH EVERY MORNING BEFORE BREAKFAST 90 tablet 1   No current facility-administered medications for this visit.     Physical Exam: Vitals:   10/12/17 1233  BP: 128/62  Pulse: 76  Weight: 123 lb (55.8 kg)  Height: 5\' 1"  (1.549 m)    GEN- The patient is well appearing, alert and oriented x 3 today.   Head- normocephalic, atraumatic Eyes-  Sclera clear, conjunctiva pink Ears- hearing intact Oropharynx- clear Lungs- Clear to ausculation bilaterally, normal work of breathing Heart- Regular rate and rhythm, no murmurs, rubs or gallops, PMI not laterally displaced GI- soft, NT, ND, + BS Extremities- no clubbing, cyanosis, or edema  EKG tracing ordered today is personally reviewed and shows sinus rhythm 76 bpm, PR 164 msec, LAHB, incomplete RBBB  Assessment and Plan:  1. HTN Stable No change required today  2. SVT Well controlled No changes  Follow-up with EP PA in a year  Thompson Grayer MD, [facility] 10/12/2017 12:48 PM

## 2017-10-21 ENCOUNTER — Telehealth: Payer: Self-pay | Admitting: Internal Medicine

## 2017-10-21 NOTE — Telephone Encounter (Signed)
Patient reports that IBGard is no longer helping with her abdominal pain.  Her pain is waking her up at night. Patient will come in and see Dr. Carlean Purl on 10/27/17 at 3:15

## 2017-10-21 NOTE — Telephone Encounter (Signed)
Patient states she has been taking IBguard but now she is having pain in lower abd that is keeping her up at night and she would like to know what to do.

## 2017-10-27 ENCOUNTER — Encounter: Payer: Self-pay | Admitting: Internal Medicine

## 2017-10-27 ENCOUNTER — Ambulatory Visit (INDEPENDENT_AMBULATORY_CARE_PROVIDER_SITE_OTHER): Payer: Medicare Other | Admitting: Internal Medicine

## 2017-10-27 VITALS — BP 118/60 | HR 104 | Ht 59.75 in | Wt 123.0 lb

## 2017-10-27 DIAGNOSIS — M5136 Other intervertebral disc degeneration, lumbar region: Secondary | ICD-10-CM | POA: Diagnosis not present

## 2017-10-27 DIAGNOSIS — R1031 Right lower quadrant pain: Secondary | ICD-10-CM | POA: Diagnosis not present

## 2017-10-27 DIAGNOSIS — K580 Irritable bowel syndrome with diarrhea: Secondary | ICD-10-CM | POA: Diagnosis not present

## 2017-10-27 DIAGNOSIS — G8929 Other chronic pain: Secondary | ICD-10-CM

## 2017-10-27 DIAGNOSIS — M51369 Other intervertebral disc degeneration, lumbar region without mention of lumbar back pain or lower extremity pain: Secondary | ICD-10-CM

## 2017-10-27 DIAGNOSIS — R1032 Left lower quadrant pain: Secondary | ICD-10-CM

## 2017-10-27 NOTE — Patient Instructions (Signed)
Normal BMI (Body Mass Index- based on height and weight) is between 23 and 30. Your BMI today is Body mass index is 24.22 kg/m. [name] Please consider follow up  regarding your BMI with your Primary Care Provider.  Please  consider seeing dr Yong Channel regarding your lower back pain.

## 2017-10-27 NOTE — Progress Notes (Signed)
[name] 77 y.o. Nov 24, 1940 [id number]  Assessment & Plan:   Encounter Diagnoses  Name Primary?  . Irritable bowel syndrome with diarrhea Yes  . Chronic bilateral lower abdominal pain   . Lumbar degenerative disc disease    She will observe her symptoms at this point.  She will follow-up with Dr. Yong Channel, she has some degenerative disc disease based upon the CT scan she had last year, she could have some neuropathy causing some of his chronic pain, I think irritable bowel syndrome and colon spasm is the most likely cause of the lower quadrant pain.  Urinalysis was negative in January I do not think she is passing kidney stones or anything like that.  She will see me as needed and continue her IB Donald Prose, she did not take that on this occasion when her pain flared at night and she will do that the next time to see if it relieves it.  We reviewed possible colonoscopy, I doubt that would show anything that would explain the symptoms and she is not inclined to have a surveillance colonoscopy previously discussed on another visit.  Consultation and evaluation with a physical therapist might help this lady and I asked Dickie to talk to Dr. Yong Channel about this.  She was asking about having an MRI meaning the abdomen and I do not think that would help sort anything different out.  The situational stress of her daughter's pancreatic cancer could be playing some role in this as well.  I appreciate the opportunity to care for this patient. CC: [name], MD    Subjective:   Chief Complaint: Abdominal pain  HPI [name] returns for follow-up of chronic abdominal pain and irritable bowel symptoms, ongoing issue that had been improved with IB Donald Prose.  She had an episode that awakened her from sleep a few weeks ago when she says "I panicked" and wanted to see you.  It is not recurred it lasted several hours and was severe bilateral lower abdominal crampy pain that felt like labor pain.  No  urinary symptoms.  No change in bowel habits.  She is using the IB gard as needed.  She does have some chronic low-grade back pain as well that is achy.  Legs will get tired at times she does walk quite a bit in general she feels like she can live with the symptoms but again was scared by the flareup.  Dr. Yong Channel saw her in January and made note that he would consider physical therapy for her.  I mentioned this to her.  Her daughter has metastatic pancreatic cancer and has been alive 2 years after the diagnosis and is due for further treatment of liver lesions that I it sounds like is being done via a percutaneous interventional radiology approach at [facility].  She does admit that she is concerned that she might have pancreatic cancer herself, that has crossed her mind and she has some stress over the health of her daughter among other things as well.  She does report that most nights she sleeps well rarely she will take a half a Xanax tablet to help her sleep.  Wt Readings from Last 3 Encounters:  10/27/17 123 lb (55.8 kg)  10/12/17 123 lb (55.8 kg)  09/10/17 125 lb 12.8 oz (57.1 kg)    Allergies  Allergen Reactions  . Codeine Nausea Only   Current Meds  Medication Sig  . acetaminophen (TYLENOL) 325 MG tablet Take 650 mg by mouth every 6 (  six) hours as needed for moderate pain.  [name] ALPRAZolam (XANAX) 0.25 MG tablet Take 1 tablet (0.25 mg total) by mouth daily as needed.  [name] BIOTIN PO Take 1 capsule by mouth daily.   . Calcium Carbonate-Vitamin D (CALCIUM + D PO) Take 1 tablet by mouth daily.    [name] diltiazem (CARTIA XT) 240 MG 24 hr capsule Take 1 capsule (240 mg total) by mouth daily.  . dorzolamide-timolol (COSOPT) 22.3-6.8 MG/ML ophthalmic solution Place 1 drop into both eyes 2 (two) times daily.  [name] escitalopram (LEXAPRO) 20 MG tablet Take 1 tablet (20 mg total) by mouth daily.  . hydrochlorothiazide (HYDRODIURIL) 25 MG tablet Take 1 tablet (25 mg total) by mouth daily.  . irbesartan  (AVAPRO) 300 MG tablet TAKE 1 TABLET (300 MG TOTAL) BY MOUTH DAILY.  . pantoprazole (PROTONIX) 40 MG tablet TAKE 1 TABLET BY MOUTH EVERY MORNING BEFORE BREAKFAST  . PEPPERMINT OIL PO Take 2 capsules by mouth 3 (three) times daily as needed. Ruthell Rummage   Past Medical History:  Diagnosis Date  . Adenomatous colon polyp 2006  . Anxiety   . Arthritis   . Benign esophageal stricture 10/14/2012  . CERUMEN IMPACTION, BILATERAL 12/13/2007  . CERVICAL RADICULOPATHY 11/05/2009  . Chronic kidney disease    kidney stones  . DEPRESSION 03/11/2007  . DIVERTICULOSIS, COLON 03/11/2007  . Esophagitis, reflux 10/14/2012  . GERD (gastroesophageal reflux disease) 10/14/2012  . Glaucoma   . 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 Hearing loss   . HEMORRHOIDS, EXTERNAL W/O COMPLICATION 9/52/8413   Qualifier: Diagnosis of  By: Jimmye Norman, LPN, Winfield Cunas   . HYPERLIPIDEMIA 09/22/2007  . HYPERTENSION 03/11/2007  . Kidney stone   . Osteopenia 02/2015   T score -1.4 FRAX 7.3%/0.6%  . Rectocele   . Rosacea 03/11/2007  . Tachycardia    Past Surgical History:  Procedure Laterality Date  . ANTERIOR CERVICAL DECOMP/DISCECTOMY FUSION  08/14/2011   Procedure: ANTERIOR CERVICAL DECOMPRESSION/DISCECTOMY FUSION 1 LEVEL/HARDWARE REMOVAL;  Surgeon: [name], MD;  Location: [facility];  Service: Neurosurgery;  Laterality: N/A;  Cervical Six-Seven anterior cervical decompression with fusion interbody prothesis plating and bonegraft with removal of hardware of Cervical Four to Cervical Six   . CERVICAL FUSION     C4-6  . COLONOSCOPY     multiple  . ESOPHAGOGASTRODUODENOSCOPY  10/14/2012   Dr. [name]  . KIDNEY SURGERY     age 42  . TONSILLECTOMY     Social History   Social History Narrative   Lives with grown son. Widowed. 5 children and 7 grandchildren.       Retired from [employer] after 5 years and later took care of children.       Hobbies: time with grandkids, shopping, go out to eat. Travels once a  year with girlfriends.    family history includes Cerebral palsy in her sister; Diabetes in her maternal grandmother; Glaucoma in her mother; Heart disease in her father; Hypertension in her father; Lung cancer in her father; Mental illness in her mother; Pancreatic cancer in her daughter; Stroke in her paternal aunt, paternal aunt, paternal uncle, paternal uncle, paternal uncle, and sister.   Review of Systems  As per HPI Objective:   Physical Exam BP 118/60 (BP Location: Left Arm, Patient Position: Sitting, Cuff Size: Normal)   Pulse (!) 104   Ht 4' 11.75" (1.518 m)   Wt 123 lb (55.8 kg)   LMP 08/12/1991   BMI 24.22 kg/m  NAD Abd soft and NT BS + No hernia  Mild SLR + on R w/ LE manipulation but none on L and no hip pain w/ int/ext rotation  Back nontender

## 2017-10-28 ENCOUNTER — Other Ambulatory Visit: Payer: Self-pay

## 2017-10-28 MED ORDER — ESCITALOPRAM OXALATE 20 MG PO TABS: 20.0000 mg | ORAL_TABLET | Freq: Every day | ORAL | 3 refills | Status: DC

## 2017-11-17 ENCOUNTER — Other Ambulatory Visit: Payer: Self-pay

## 2017-11-17 MED ORDER — IRBESARTAN 300 MG PO TABS: 300.0000 mg | ORAL_TABLET | Freq: Every day | ORAL | 1 refills | Status: DC

## 2017-12-17 ENCOUNTER — Other Ambulatory Visit: Payer: Self-pay | Admitting: Family Medicine

## 2018-01-20 ENCOUNTER — Other Ambulatory Visit: Payer: Self-pay | Admitting: Family Medicine

## 2018-02-12 ENCOUNTER — Other Ambulatory Visit: Payer: Self-pay | Admitting: Internal Medicine

## 2018-02-21 ENCOUNTER — Other Ambulatory Visit: Payer: Self-pay | Admitting: Family Medicine

## 2018-03-18 ENCOUNTER — Telehealth: Payer: Self-pay | Admitting: Family Medicine

## 2018-03-18 MED ORDER — DILTIAZEM HCL ER COATED BEADS 240 MG PO CP24: 240.0000 mg | ORAL_CAPSULE | Freq: Every day | ORAL | 0 refills | Status: DC

## 2018-03-18 NOTE — Telephone Encounter (Signed)
Copied from [place] [id number]. Topic: Quick Communication - Rx Refill/Question >> Mar 18, 2018 10:42 AM Bea Graff, NT wrote: Medication: diltiazem (CARTIA XT) 240 MG 24 hr capsule  Has the patient contacted their pharmacy? Yes.   (Agent: If no, request that the patient contact the pharmacy for the refill.) (Agent: If yes, when and what did the pharmacy advise?)  Preferred Pharmacy (with phone number or street name): [id number] - [place], [place] - [address] [phone] (Phone) [phone] (Fax)    Agent: Please be advised that RX refills may take up to 3 business days. We ask that you follow-up with your pharmacy.

## 2018-04-02 LAB — HM MAMMOGRAPHY

## 2018-04-19 ENCOUNTER — Telehealth: Payer: Self-pay | Admitting: Family Medicine

## 2018-04-19 NOTE — Telephone Encounter (Signed)
Lets use valsartan 320mg  then- please remove irbesartan.   How about the refill on the cartia- where are we at on that? im ok if needs to be #180

## 2018-04-19 NOTE — Telephone Encounter (Signed)
See note

## 2018-04-19 NOTE — Telephone Encounter (Signed)
Insurance is requesting a Prior Authorization on irbesartan (AVAPRO) 300 MG tablet. They have 150mg  dosage that would have to be doubled to equal the 300mg  dosage. They do have valsartan 320mg  they can use as a substitute. Please advise

## 2018-04-19 NOTE — Telephone Encounter (Signed)
Copied from [place] [id number]. Topic: Quick Communication - See Telephone Encounter >> Apr 19, 2018  9:56 AM Conception Chancy, NT wrote: CRM for notification. See Telephone encounter for: 04/19/18.  Patient is calling and states she has been trying to get a refill on diltiazem (CARTIA XT) 240 MG 24 hr capsule since 03/18/18. She states her insurance company is needing a prior authorization since it was called in for 180 tablets. I see that it was called in on 03/18/18 for 90 capsules and to take 2x a day. Please advise.  [id number] - [place], [place] - [address] [place] [address] Phone: [phone] Fax: [phone]

## 2018-04-20 ENCOUNTER — Other Ambulatory Visit: Payer: Self-pay

## 2018-04-20 MED ORDER — DILTIAZEM HCL ER COATED BEADS 240 MG PO CP24: 240.0000 mg | ORAL_CAPSULE | Freq: Every day | ORAL | 2 refills | Status: DC

## 2018-04-20 MED ORDER — VALSARTAN 320 MG PO TABS: 320.0000 mg | ORAL_TABLET | Freq: Every day | ORAL | 3 refills | Status: DC

## 2018-04-20 NOTE — Telephone Encounter (Signed)
Irbesartan removed from medication list. Valsartan sent in to the pharmacy as instructed. Called patient and made her aware of the change. She verbalized understanding.  I am also sending in a refill on her [place].

## 2018-07-05 ENCOUNTER — Other Ambulatory Visit: Payer: Self-pay | Admitting: Family Medicine

## 2018-07-07 ENCOUNTER — Other Ambulatory Visit: Payer: Self-pay | Admitting: Family Medicine

## 2018-07-17 ENCOUNTER — Other Ambulatory Visit: Payer: Self-pay | Admitting: Family Medicine

## 2018-07-23 ENCOUNTER — Telehealth: Payer: Self-pay | Admitting: Family Medicine

## 2018-07-23 NOTE — Telephone Encounter (Signed)
Copied from [place] [id number]. Topic: Quick Communication - See Telephone Encounter >> Jul 23, 2018 12:29 PM Rutherford Nail, NT wrote: CRM for notification. See Telephone encounter for: 07/23/18. [facility] and states that they cannot get the ALPRAZolam (XANAX) 0.25 MG tablet  and the patient would like it called into [place], [place] - [address]

## 2018-07-27 NOTE — Telephone Encounter (Signed)
Please see msg-  Ok to send rx to [facility] in [place]?

## 2018-07-27 NOTE — Telephone Encounter (Signed)
Yes thanks, you may send to the requested [facility] in [place]

## 2018-07-28 ENCOUNTER — Other Ambulatory Visit: Payer: Self-pay

## 2018-07-28 MED ORDER — ALPRAZOLAM 0.25 MG PO TABS: 0.2500 mg | ORAL_TABLET | Freq: Every day | ORAL | 0 refills | Status: DC | PRN

## 2018-07-28 NOTE — Telephone Encounter (Signed)
Rx faxed to [facility]

## 2018-08-15 ENCOUNTER — Other Ambulatory Visit: Payer: Self-pay | Admitting: Family Medicine

## 2018-08-15 ENCOUNTER — Other Ambulatory Visit: Payer: Self-pay | Admitting: Internal Medicine

## 2018-08-16 ENCOUNTER — Ambulatory Visit (INDEPENDENT_AMBULATORY_CARE_PROVIDER_SITE_OTHER): Payer: Medicare Other | Admitting: Family Medicine

## 2018-08-16 ENCOUNTER — Encounter: Payer: Self-pay | Admitting: Family Medicine

## 2018-08-16 VITALS — BP 126/62 | HR 59 | Temp 98.3°F | Ht 59.75 in | Wt 123.4 lb

## 2018-08-16 DIAGNOSIS — I10 Essential (primary) hypertension: Secondary | ICD-10-CM

## 2018-08-16 DIAGNOSIS — F3341 Major depressive disorder, recurrent, in partial remission: Secondary | ICD-10-CM

## 2018-08-16 DIAGNOSIS — R739 Hyperglycemia, unspecified: Secondary | ICD-10-CM | POA: Diagnosis not present

## 2018-08-16 DIAGNOSIS — E785 Hyperlipidemia, unspecified: Secondary | ICD-10-CM

## 2018-08-16 LAB — CBC
HCT: 39.8 % (ref 36.0–46.0)
Hemoglobin: 13.4 g/dL (ref 12.0–15.0)
MCHC: 33.8 g/dL (ref 30.0–36.0)
MCV: 93.4 fl (ref 78.0–100.0)
PLATELETS: 289 10*3/uL (ref 150.0–400.0)
RBC: 4.26 Mil/uL (ref 3.87–5.11)
RDW: 12.7 % (ref 11.5–15.5)
WBC: 7.7 10*3/uL (ref 4.0–10.5)

## 2018-08-16 LAB — COMPREHENSIVE METABOLIC PANEL
ALT: 13 U/L (ref 0–35)
AST: 13 U/L (ref 0–37)
Albumin: 4.2 g/dL (ref 3.5–5.2)
Alkaline Phosphatase: 51 U/L (ref 39–117)
BUN: 16 mg/dL (ref 6–23)
CO2: 32 meq/L (ref 19–32)
Calcium: 9.8 mg/dL (ref 8.4–10.5)
Chloride: 100 mEq/L (ref 96–112)
Creatinine, Ser: 0.79 mg/dL (ref 0.40–1.20)
GFR: 74.5 mL/min (ref >60.00)
GLUCOSE: 111 mg/dL — AB (ref 70–99)
POTASSIUM: 3.7 meq/L (ref 3.5–5.1)
Sodium: 141 mEq/L (ref 135–145)
Total Bilirubin: 0.8 mg/dL (ref 0.2–1.2)
Total Protein: 6.4 g/dL (ref 6.0–8.3)

## 2018-08-16 LAB — LIPID PANEL
CHOL/HDL RATIO: 6
Cholesterol: 232 mg/dL — ABNORMAL HIGH (ref 0–200)
HDL: 36.6 mg/dL — AB (ref >39.00)
NONHDL: 195.61
Triglycerides: 247 mg/dL — ABNORMAL HIGH (ref 0.0–149.0)
VLDL: 49.4 mg/dL — ABNORMAL HIGH (ref 0.0–40.0)

## 2018-08-16 LAB — HEMOGLOBIN A1C: HEMOGLOBIN A1C: 6.3 % (ref 4.6–6.5)

## 2018-08-16 LAB — LDL CHOLESTEROL, DIRECT: Direct LDL: 135 mg/dL

## 2018-08-16 NOTE — Patient Instructions (Addendum)
No changes planned today unless labs lead [place] to make changes- may need to consider metformin if hemoglobin A1c is above 7 which would mean an average blood sugar over 150, also if diagnosed with diabetes would need to consider once a week cholesterol medicine at a minimum  I am so sorry about your loss.  Please consider hospice grief counseling or counseling with [facility]-I think both of these would help you.  Continue Lexapro 20 mg   Please stop by lab before you go  At the checkout desk please schedule 4 to 60-month follow-up depending on results of A1c-if A1c is 6.5 or above would follow-up in 4 months

## 2018-08-16 NOTE — Assessment & Plan Note (Signed)
S: controlled on Cartia 240 mg extended release, hydrochlorothiazide 25 mg, irbesartan 300 mg--> now transitioned to valsartan 320 mg daily recalls BP Readings from Last 3 Encounters:  08/16/18 126/62  10/27/17 118/60  10/12/17 128/62  A/P: We discussed blood pressure goal of <140/90. Continue current meds

## 2018-08-16 NOTE — Assessment & Plan Note (Signed)
S: Last year technically could have diagnosed with diabetes with CBG over 125 and A1c at 6.8 but we gave patient a 32-month.  To work on diet and exercise before repeat-unfortunately not repeat never happened until today as this is her first follow-up in nearly a year.  She admitted last year to not eating well recently and little exercise-this year- has not been exercising or eating the best at present either  Lab Results  Component Value Date   HGBA1C 6.8 (H) 08/31/2017   A/P: update a1c- suspect strongly that we will diagnose diabetes

## 2018-08-16 NOTE — Assessment & Plan Note (Signed)
S: If we end up diagnosing diabetes- will need to start statin Lab Results  Component Value Date   CHOL 200 08/31/2017   HDL 31.10 (L) 08/31/2017   LDLCALC 82 10/10/2013   LDLDIRECT 111.0 08/31/2017   TRIG 295.0 (H) 08/31/2017   CHOLHDL 6 08/31/2017   A/P: suspect will be elevated and if diabetic will start statin- if not diabetic likely wait given so close to age 78 and unclear benefit/risk for primary prevention

## 2018-08-16 NOTE — Progress Notes (Signed)
Subjective:  [name] is a 78 y.o. year old very pleasant female patient who presents for/with See problem oriented charting ROS-occasional palpitations.  No chest pain or shortness of breath reported.  Some sad mood over loss of son.  No suicidal ideation.  Past Medical History-  Patient Active Problem List   Diagnosis Date Noted  . Hyperglycemia 08/24/2017    Priority: High  . SVT (supraventricular tachycardia) ([place]) 08/14/2011    Priority: High  . GERD (gastroesophageal reflux disease) 07/14/2014    Priority: Medium  . Primary open angle glaucoma of both eyes, mild stage 05/29/2014    Priority: Medium  . IBS (irritable bowel syndrome)- diarrhea predominant 01/26/2013    Priority: Medium  . Glaucoma associated with ocular disorder 11/05/2009    Priority: Medium  . Hyperlipidemia 09/22/2007    Priority: Medium  . Depression, major, recurrent, in partial remission ([place]). Lexapro 20mg . Xanax once a week.  03/11/2007    Priority: Medium  . Essential hypertension 03/11/2007    Priority: Medium  . History of colonic polyps 11/28/2011    Priority: Low  . Cervical disc disorder with radiculopathy of cervical region 11/05/2009    Priority: Low  . Rosacea 03/11/2007    Priority: Low  . Knee pain, left 11/02/2013  . Senile nuclear sclerosis 06/29/2011    Medications- reviewed and updated Current Outpatient Medications  Medication Sig Dispense Refill  . acetaminophen (TYLENOL) 325 MG tablet Take 650 mg by mouth every 6 (six) hours as needed for moderate pain.    [name] ALPRAZolam (XANAX) 0.25 MG tablet Take 1 tablet (0.25 mg total) by mouth daily as needed. 30 tablet 0  . BIOTIN PO Take 1 capsule by mouth daily.     . Calcium Carbonate-Vitamin D (CALCIUM + D PO) Take 1 tablet by mouth daily.      [name] diltiazem (CARTIA XT) 240 MG 24 hr capsule Take 1 capsule (240 mg total) by mouth daily. 90 capsule 2  . dorzolamide-timolol (COSOPT) 22.3-6.8 MG/ML ophthalmic solution Place 1 drop into  both eyes 2 (two) times daily.    [name] escitalopram (LEXAPRO) 20 MG tablet TAKE 1 TABLET BY MOUTH EVERY DAY 90 tablet 1  . hydrochlorothiazide (HYDRODIURIL) 25 MG tablet TAKE 1 TABLET BY MOUTH EVERY DAY 90 tablet 1  . pantoprazole (PROTONIX) 40 MG tablet TAKE 1 TABLET BY MOUTH EVERY MORNING BEFORE BREAKFAST 90 tablet 1  . PEPPERMINT OIL PO Take 2 capsules by mouth 3 (three) times daily as needed. IB Gard    . valsartan (DIOVAN) 320 MG tablet Take 1 tablet (320 mg total) by mouth daily. 90 tablet 3   No current facility-administered medications for this visit.     Objective: BP 126/62 (BP Location: Left Arm, Patient Position: Sitting, Cuff Size: Large)   Pulse (!) 59   Temp 98.3 F (36.8 C) (Oral)   Ht 4' 11.75" (1.518 m)   Wt 123 lb 6.4 oz (56 kg)   LMP 08/12/1991   SpO2 98%   BMI 24.30 kg/m  Gen: NAD, resting comfortably Slightly hard of hearing, tympanic membranes normal bilaterally, nasal turbinates normal CV: RRR no murmurs rubs or gallops Lungs: CTAB no crackles, wheeze, rhonchi Ext: no edema Skin: warm, dry Neuro: Speech normal, moves all extremities  Assessment/Plan:  Other notes: 1.lost her son at age 34 due to heart attack march 2019. She declines counseling.  This has been very difficult for her but she seems to be doing better lately  2. Thinks  she is coming up on cataract surgery  Essential hypertension S: controlled on Cartia 240 mg extended release, hydrochlorothiazide 25 mg, irbesartan 300 mg--> now transitioned to valsartan 320 mg daily recalls BP Readings from Last 3 Encounters:  08/16/18 126/62  10/27/17 118/60  10/12/17 128/62  A/P: We discussed blood pressure goal of <140/90. Continue current meds  Hyperglycemia S: Last year technically could have diagnosed with diabetes with CBG over 125 and A1c at 6.8 but we gave patient a 36-month.  To work on diet and exercise before repeat-unfortunately not repeat never happened until today as this is her first  follow-up in nearly a year.  She admitted last year to not eating well recently and little exercise-this year- has not been exercising or eating the best at present either  Lab Results  Component Value Date   HGBA1C 6.8 (H) 08/31/2017   A/P: update a1c- suspect strongly that we will diagnose diabetes  Hyperlipidemia S: If we end up diagnosing diabetes- will need to start statin Lab Results  Component Value Date   CHOL 200 08/31/2017   HDL 31.10 (L) 08/31/2017   LDLCALC 82 10/10/2013   LDLDIRECT 111.0 08/31/2017   TRIG 295.0 (H) 08/31/2017   CHOLHDL 6 08/31/2017   A/P: suspect will be elevated and if diabetic will start statin- if not diabetic likely wait given so close to age 13 and unclear benefit/risk for primary prevention  Depression, major, recurrent, in partial remission ([place]). Lexapro 20mg . Xanax once a week.  S: Patient is still taking lexapro 20mg . Some struggles in area of grieving- working on letting go after loss of son.  A/P: Mild poor control today- I offered patient counseling-she is going to consider this but wants to continue with the Lexapro 20 mg alone right now-some of this is grieving and will continue to monitor  She does use sparing Xanax for anxiety-I would be willing to refill this if she calls them  4 to 46-month follow-up depending on results of A1c-if A1c is 6.5 or above would follow-up in 4 months  Lab/Order associations: FASTING Essential hypertension  Hyperglycemia  Hyperlipidemia, unspecified hyperlipidemia type  Depression, major, recurrent, in partial remission ([place]). Lexapro 20mg . Xanax once a week.   Return precautions advised.  Garret Reddish, MD

## 2018-08-16 NOTE — Assessment & Plan Note (Signed)
S: Patient is still taking lexapro 20mg . Some struggles in area of grieving- working on letting go after loss of son.  A/P: Mild poor control today- I offered patient counseling-she is going to consider this but wants to continue with the Lexapro 20 mg alone right now-some of this is grieving and will continue to monitor  She does use sparing Xanax for anxiety-I would be willing to refill this if she calls them

## 2018-08-25 ENCOUNTER — Ambulatory Visit: Payer: Self-pay | Admitting: *Deleted

## 2018-08-25 NOTE — Telephone Encounter (Signed)
Forwarding to Dr. Hunter as FYI.  

## 2018-08-25 NOTE — Telephone Encounter (Signed)
Thanks for the update

## 2018-08-25 NOTE — Telephone Encounter (Signed)
Dr. Yong Channel, I sent a message to Roselyn Reef (as I was updated to now sent skype messages to the clinical team about high priority messages) this morning at 8:49am and Roselyn Reef was in a room with a patient. I received a response from [facility] at 9:11am that she would see the note I routed to [facility]. There is no more availability for today and the [facility] followed the disposition to schedule patient for tomorrow.   I just called and spoke with the patient and she said she was okay to keep the patient for tomorrow and it has not gotten worse. I advised that if it did for her to contact us or go to urgent care if symptoms increase or get worse overnight.   No further action required, patient confirmed she would be at the office tomorrow at 8:40am.

## 2018-08-25 NOTE — Telephone Encounter (Signed)
Brandy or [place]- see if patient can be worked in on someone elses schedule. I received this at 2 40- no one alerted me of the need for work in while I was still seeing patients.

## 2018-08-25 NOTE — Telephone Encounter (Signed)
See note

## 2018-08-25 NOTE — Telephone Encounter (Signed)
Pt called with having swelling with her right hand that started on Sunday. She states the swelling has gone down some. She took Advil and Benadryl and she thinks that helped. It was red but now the redness is decreased. The back of hand is puffy with a knot (lump) behind the little finger. She denies fever and her hand is not hot to touch. Not painful today but hard to use yesterday. Per protocol, appointment scheduled for tomorrow. Pt requesting to see her provider today. Kara, flow at [facility].  Will route message to office. Request a call back regarding appointment for today.  Reason for Disposition . [1] Small area of LOCALIZED swelling AND [2] not better after 3 days  Answer Assessment - Initial Assessment Questions 1. ONSET: "When did the swelling start?" (e.g., minutes, hours, days)     3 days ago 2. LOCATION: "What part of the hand is swollen?"  "Are both hands swollen or just one hand?"     Right hand, wrist and little finger 3. SEVERITY: "How bad is the swelling?" (e.g., localized; mild, moderate, severe)   - BALL OR LUMP: small ball or lump   - LOCALIZED: puffy or swollen area or patch of skin   - JOINT SWELLING: swelling of a joint   - MILD: puffiness or mild swelling of fingers or hand   - MODERATE: fingers and hand are swollen   - SEVERE: swelling of entire hand and up into forearm     Localized with a knott 4. REDNESS: "Does the swelling look red or infected?"     Little red around the knott 5. PAIN: "Is the swelling painful to touch?" If so, ask: "How painful is it?"   (Scale 1-10; mild, moderate or severe)     Not painful today, hard to use yesterday 6. FEVER: "Do you have a fever?" If so, ask: "What is it, how was it measured, and when did it start?"      no 7. CAUSE: "What do you think is causing the hand swelling?" (e.g., heat, insect bite, pregnancy, recent injury)     Not sure 8. MEDICAL HISTORY: "Do you have a history of heart failure, kidney  disease, liver failure, or cancer?"     no 9. RECURRENT SYMPTOM: "Have you had hand swelling before?" If so, ask: "When was the last time?" "What happened that time?"     no 10. OTHER SYMPTOMS: "Do you have any other symptoms?" (e.g., blurred vision, difficulty breathing, headache)       no 11. PREGNANCY: "Is there any chance you are pregnant?" "When was your last menstrual period?"       n/a  Protocols used: HAND [facility]

## 2018-08-26 ENCOUNTER — Ambulatory Visit (INDEPENDENT_AMBULATORY_CARE_PROVIDER_SITE_OTHER): Payer: Medicare Other | Admitting: Family Medicine

## 2018-08-26 ENCOUNTER — Encounter: Payer: Self-pay | Admitting: Family Medicine

## 2018-08-26 VITALS — BP 134/68 | HR 60 | Temp 98.2°F | Ht 59.75 in | Wt 124.8 lb

## 2018-08-26 DIAGNOSIS — M25531 Pain in right wrist: Secondary | ICD-10-CM

## 2018-08-26 MED ORDER — DICLOFENAC SODIUM 75 MG PO TBEC: 75.0000 mg | DELAYED_RELEASE_TABLET | Freq: Two times a day (BID) | ORAL | 0 refills | Status: DC

## 2018-08-26 NOTE — Patient Instructions (Addendum)
I suspect this may be gout or pseudogout  Trial Voltaren twice a day for 5 days.  If pain is improving but has not resolved can use up to 7 days.  Make sure to take your Protonix with this-this does slightly increase your bleeding risk from your stomach-if you note any blood in your stool or dark black stool let us know immediately.  If you have fever, worsening pain, expanding redness despite this treatment-I want to see you back immediately  At our next regular visit-I want to check a uric acid level to get more information on whether this could be gout.

## 2018-08-26 NOTE — Progress Notes (Addendum)
Subjective:  [name] is a 78 y.o. year old very pleasant female patient who presents for/with See problem oriented charting ROS-no fever, chills, nausea, vomiting.  No expanding redness or worsening pain-pain improving with Advil  Past Medical History-  Patient Active Problem List   Diagnosis Date Noted  . Hyperglycemia 08/24/2017    Priority: High  . SVT (supraventricular tachycardia) ([place]) 08/14/2011    Priority: High  . GERD (gastroesophageal reflux disease) 07/14/2014    Priority: Medium  . Primary open angle glaucoma of both eyes, mild stage 05/29/2014    Priority: Medium  . IBS (irritable bowel syndrome)- diarrhea predominant 01/26/2013    Priority: Medium  . Glaucoma associated with ocular disorder 11/05/2009    Priority: Medium  . Hyperlipidemia 09/22/2007    Priority: Medium  . Depression, major, recurrent, in partial remission ([place]). Lexapro 20mg . Xanax once a week.  03/11/2007    Priority: Medium  . Essential hypertension 03/11/2007    Priority: Medium  . History of colonic polyps 11/28/2011    Priority: Low  . Cervical disc disorder with radiculopathy of cervical region 11/05/2009    Priority: Low  . Rosacea 03/11/2007    Priority: Low  . Knee pain, left 11/02/2013  . Senile nuclear sclerosis 06/29/2011    Medications- reviewed and updated Current Outpatient Medications  Medication Sig Dispense Refill  . acetaminophen (TYLENOL) 325 MG tablet Take 650 mg by mouth every 6 (six) hours as needed for moderate pain.    [name] ALPRAZolam (XANAX) 0.25 MG tablet Take 1 tablet (0.25 mg total) by mouth daily as needed. 30 tablet 0  . BIOTIN PO Take 1 capsule by mouth daily.     . Calcium Carbonate-Vitamin D (CALCIUM + D PO) Take 1 tablet by mouth daily.      [name] diltiazem (CARTIA XT) 240 MG 24 hr capsule Take 1 capsule (240 mg total) by mouth daily. 90 capsule 2  . dorzolamide-timolol (COSOPT) 22.3-6.8 MG/ML ophthalmic solution Place 1 drop into both eyes 2 (two) times  daily.    [name] escitalopram (LEXAPRO) 20 MG tablet TAKE 1 TABLET BY MOUTH EVERY DAY 90 tablet 1  . hydrochlorothiazide (HYDRODIURIL) 25 MG tablet TAKE 1 TABLET BY MOUTH EVERY DAY 90 tablet 1  . pantoprazole (PROTONIX) 40 MG tablet TAKE 1 TABLET BY MOUTH EVERY MORNING BEFORE BREAKFAST 90 tablet 1  . PEPPERMINT OIL PO Take 2 capsules by mouth 3 (three) times daily as needed. IB Gard    . valsartan (DIOVAN) 320 MG tablet Take 1 tablet (320 mg total) by mouth daily. 90 tablet 3  . diclofenac (VOLTAREN) 75 MG EC tablet Take 1 tablet (75 mg total) by mouth 2 (two) times daily. 14 tablet 0   No current facility-administered medications for this visit.     Objective: BP 134/68 (BP Location: Left Arm, Patient Position: Sitting, Cuff Size: Large)   Pulse 60   Temp 98.2 F (36.8 C) (Oral)   Ht 4' 11.75" (1.518 m)   Wt 124 lb 12.8 oz (56.6 kg)   LMP 08/12/1991   SpO2 95%   BMI 24.58 kg/m  Gen: NAD, resting comfortably CV: RRR no murmurs rubs or gallops Lungs: CTAB no crackles, wheeze, rhonchi Ext: no edema Skin: warm, dry, mild redness over wrist extending onto the hand MSK: Patient with tenderness around right wrist with associated warmth and erythema.  Hurts with range of motion of the wrist but motion of the wrist is full.  She also has some mild  pain over MCP joint fifth finger of right hand.  Assessment/Plan:   Right wrist pain  S: Started Monday afternoon Pain in wrist down into hand Noted redness over the hand Made her feel ore fatigued Tuesday woke up and was slightly better then worsened through Tuesday and Wednesday Pain up to 5 out of 10 aching sensation Better with compression At times notes some mild soresness into the arm.  Slightly better today- benadryl and advil yesterday advil seemed to help No fall of injury A/P: 78 year old female without history of gout- right wrist swelling, pain, warmth, erythema without any obvious break in the skin and pain centralized primarily  at the wrist that has been reasonably responsive to Advil-I am concerned about gout or pseudogout. -Hesitant to use prednisone with A1c of 6.3 and glaucoma -Opted for short course of Voltaren twice a day for 5 days.  Up to 7 days if needed. -Think blood pressure can tolerate short-term NSAIDs.  Reasonable GFR for trial as well - On PPI in regards to increase GI bleeding risk and will use short-term NSAIDs only -Return precautions given-consider diagnosis of cellulitis but I think gout or pseudogout is more likely. -Patient well-appearing with stable vital signs- doubt septic joint   Meds ordered this encounter  Medications  . diclofenac (VOLTAREN) 75 MG EC tablet    Sig: Take 1 tablet (75 mg total) by mouth 2 (two) times daily.    Dispense:  14 tablet    Refill:  0    Return precautions advised.  Garret Reddish, MD

## 2018-10-27 ENCOUNTER — Encounter: Payer: Self-pay | Admitting: Family Medicine

## 2019-01-12 ENCOUNTER — Other Ambulatory Visit: Payer: Self-pay | Admitting: Family Medicine

## 2019-01-13 NOTE — Telephone Encounter (Signed)
Left message to return phone call.

## 2019-02-10 ENCOUNTER — Other Ambulatory Visit: Payer: Self-pay | Admitting: Family Medicine

## 2019-02-25 ENCOUNTER — Other Ambulatory Visit: Payer: Self-pay | Admitting: Internal Medicine

## 2019-03-11 ENCOUNTER — Other Ambulatory Visit: Payer: Self-pay

## 2019-03-12 ENCOUNTER — Telehealth (INDEPENDENT_AMBULATORY_CARE_PROVIDER_SITE_OTHER): Payer: Medicare Other | Admitting: Family Medicine

## 2019-03-12 ENCOUNTER — Other Ambulatory Visit: Payer: Self-pay

## 2019-03-12 DIAGNOSIS — R42 Dizziness and giddiness: Secondary | ICD-10-CM

## 2019-03-12 DIAGNOSIS — H9191 Unspecified hearing loss, right ear: Secondary | ICD-10-CM

## 2019-03-12 NOTE — Progress Notes (Signed)
Virtual Visit via Telephone Note  I connected with the patient on 03/12/19 at 10:40 AM EDT by telephone and verified that I am speaking with the correct person using two identifiers. We attempted to connect virtually but we had technical difficulties with the audio and video.     I discussed the limitations, risks, security and privacy concerns of performing an evaluation and management service by telephone and the availability of in person appointments. I also discussed with the patient that there may be a patient responsible charge related to this service. The patient expressed understanding and agreed to proceed.  Location patient: home Location provider: work or home office Participants present for the call: patient, provider Patient did not have a visit in the prior 7 days to address this/these issue(s).   History of Present Illness: Here for 2 days of pressure in the right ear (not pain) and decreased hearing. She was also very dizzy yesterday but this has improved today. She says there is "fluid in her ear". No fever or headache or ST or cough or SOB. I see in her chart she has a hx of cerumen impactions. No other neurologic deficits.    Observations/Objective: Patient sounds cheerful and well on the phone. I do not appreciate any SOB. Speech and thought processing are grossly intact. Patient reported vitals:  Assessment and Plan: She has some vertigo and also what is either eustachian tube dysfunction or a cerumen impaction. She will drink fluids and try Mucinex BID. If she is not better by next Monday, I asked her to make an in person OV to check the ear.  Alysia Penna, MD   Follow Up Instructions:     [phone] 5-10 [phone] 11-20 [id number] 21-30 I did not refer this patient for an OV in the next 24 hours for this/these issue(s).  I discussed the assessment and treatment plan with the patient. The patient was provided an opportunity to ask questions and all were answered. The  patient agreed with the plan and demonstrated an understanding of the instructions.   The patient was advised to call back or seek an in-person evaluation if the symptoms worsen or if the condition fails to improve as anticipated.  I provided 12 minutes of non-face-to-face time during this encounter.   Alysia Penna, MD

## 2019-03-14 ENCOUNTER — Other Ambulatory Visit: Payer: Self-pay

## 2019-03-14 ENCOUNTER — Encounter: Payer: Self-pay | Admitting: Physician Assistant

## 2019-03-14 ENCOUNTER — Ambulatory Visit (INDEPENDENT_AMBULATORY_CARE_PROVIDER_SITE_OTHER): Payer: Medicare Other | Admitting: Physician Assistant

## 2019-03-14 ENCOUNTER — Telehealth: Payer: Self-pay | Admitting: Family Medicine

## 2019-03-14 VITALS — BP 130/80 | HR 60 | Temp 97.9°F | Ht 59.75 in | Wt 120.4 lb

## 2019-03-14 DIAGNOSIS — H9201 Otalgia, right ear: Secondary | ICD-10-CM

## 2019-03-14 DIAGNOSIS — Z20822 Contact with and (suspected) exposure to covid-19: Secondary | ICD-10-CM

## 2019-03-14 MED ORDER — ONDANSETRON HCL 4 MG PO TABS: 4.0000 mg | ORAL_TABLET | Freq: Three times a day (TID) | ORAL | 0 refills | Status: DC | PRN

## 2019-03-14 NOTE — Telephone Encounter (Signed)
Pt had telemedicine visit 03/12/19.

## 2019-03-14 NOTE — Telephone Encounter (Signed)
[facility] [place] RECORD AccessNurse Patient Name: BREHANNA DEVENY Gender: Female DOB: Nov 23, 1940 Age: 78 Y 2 M 10 D Return Phone Number: [phone] (Primary) Address: City/State/Zip: [place] [address] Client [facility] [place] Client Site [facility] [place] Night Physician Garret Reddish- MD Contact Type Call Who Is Calling Patient / Member / Family / Caregiver Call Type Triage / Clinical Relationship To Patient Self Return Phone Number [phone] (Primary) Chief Complaint Dizziness Reason for Call Symptomatic / Request for [place] states woke up with vertigo in her right ear. Better today but her right ear is stopped up. [place] Not Listed patient wants virtual visit [phone] Translation No Nurse Assessment Nurse: Kathi Ludwig, RN, Leana Roe Date/Time [name] Time): 03/12/2019 9:16:56 AM Confirm and document reason for call. If symptomatic, describe symptoms. ---Caller states vertigo yesterday. right ear stopped up. No fever, took Tylenol yesterday Has the patient had close contact with a person known or suspected to have the novel coronavirus illness OR traveled / lives in area with major community spread (including international travel) in the last 14 days from the onset of symptoms? * If Asymptomatic, screen for exposure and travel within the last 14 days. ---No Does the patient have any new or worsening symptoms? ---Yes Will a triage be completed? ---Yes Related visit to physician within the last 2 weeks? ---No Does the PT have any chronic conditions? (i.e. diabetes, asthma, this includes High risk factors for pregnancy, etc.) ---Yes List chronic conditions. ---HTN Is this a behavioral health or substance abuse call? ---No Guidelines Guideline Title Affirmed Question Affirmed Notes Nurse Date/Time (Eastern Time) Dizziness -  Vertigo [1] Dizziness (vertigo) present now AND [2] one or more stroke risk factors (i.e., hypertension, diabetes, prior stroke/ TIA, heart attack) Kathi Ludwig, RN, Leana Roe 03/12/2019 9:18:11 AM PLEASE NOTE: All timestamps contained within this report are represented as [place] Standard Time. CONFIDENTIALTY NOTICE: This fax transmission is intended only for the addressee. It contains information that is legally privileged, confidential or otherwise protected from use or disclosure. If you are not the intended recipient, you are strictly prohibited from reviewing, disclosing, copying using or disseminating any of this information or taking any action in reliance on or regarding this information. If you have received this fax in error, please notify us immediately by telephone so that we can arrange for its return to [place]. Phone: [phone], Toll-Free: [phone], Fax: [phone] Page: 2 of 2 Call Id: [id number] Guidelines Guideline Title Affirmed Question Affirmed Notes Nurse Date/Time [name] Time) (Exception: prior physician evaluation for this AND no different/ worse than usual) Disp. Time [name] Time) Disposition Final User 03/12/2019 9:19:48 AM Go to ED Now (or PCP triage) Yes Kathi Ludwig, RN, [place] Ends Disagree/Comply Comply Caller Understands Yes PreDisposition Did not know what to do Care Advice Given Per Guideline GO TO ED NOW (OR PCP TRIAGE): * IF NO PCP (PRIMARY CARE PROVIDER) SECOND-LEVEL TRIAGE: You need to be seen within the next hour. Go to the [facility] at _____________ [place] as soon as you can. NOTE TO TRIAGER - DRIVING: BRING MEDICINES: * Please bring a list of your current medicines when you go to see the doctor. * Another adult should drive. CARE ADVICE given per Dizziness - Vertigo (Adult) guideline. Comments User: Estevan Ryder, RN Date/Time [name] Time): 03/12/2019 9:19:14 AM vomited yesterday, none today User: Estevan Ryder, RN Date/Time [name]  Time): 03/12/2019 9:22:10 AM [phone] virtual visit  Referrals GO TO FACILITY OTHER - SPECIFY   PER TEAMHEALTH

## 2019-03-14 NOTE — Patient Instructions (Addendum)
It was great to see you!  We have put in an order for you to be tested for COVID-19.  You do not need an appointment to go get testing -- please proceed to one of the following drive-up testing locations at your convenience. The line closes at 3:30pm daily. Test results may take up to a week to return.  [facility] Location: [address] [place] (old [facility]) Hours: 8a-3:45p, M-F  [facility] Location: [address] [place], [place] [address] [place] [facility]) Hours: 8a-3:45p, M-F  [facility] Location: [facility] (across from [facility] Emergency Department/[place]) Hours: 8a-3:45p, M-F  Given the current COVID-19 outbreak, it is my professional recommendation that for your symptoms you should self-isolate for at least 7 days from when your symptoms started.   If you have any worsening of symptoms please go to the Emergency Room or Urgent Care.  If you develop severe shortness of breath, uncontrolled fevers, coughing up blood, confusion, chest pain, or signs of dehydration (such as significantly decreased urine amounts or dizziness with standing) please CALL the ER and then GO to the ER.  Push fluids and get plenty of rest.  Call clinic with questions.  I hope you start feeling better soon!

## 2019-03-14 NOTE — Progress Notes (Signed)
[name] is a 78 y.o. female here for a follow up of a pre-existing problem.  I acted as a Education administrator for [employer], PA-C Anselmo Pickler, LPN  History of Present Illness:   Chief Complaint  Patient presents with  . Otalgia    HPI   Right ear pain Pt c/o right ear pain on Friday and was dizzy, vomited x 1 and was in bed all day. Had Televisit with Dr. Sarajane Jews on Saturday 8/1 told her to take mucinex for congestion and have ear looked at on Monday. Pt tried Mucinex and vomited after. Also endorses HA and nausea. She states that she is drinking plenty of fluids but appetite is "off".  Denies: cough, SOB, chest pain, LE swelling, changes in vision, sore throat, palpitations  BP Readings from Last 3 Encounters:  03/14/19 130/80  08/26/18 134/68  08/16/18 126/62     Past Medical History:  Diagnosis Date  . Adenomatous colon polyp 2006  . Anxiety   . Arthritis   . Benign esophageal stricture 10/14/2012  . CERUMEN IMPACTION, BILATERAL 12/13/2007  . CERVICAL RADICULOPATHY 11/05/2009  . Chronic kidney disease    kidney stones  . DEPRESSION 03/11/2007  . DIVERTICULOSIS, COLON 03/11/2007  . Esophagitis, reflux 10/14/2012  . GERD (gastroesophageal reflux disease) 10/14/2012  . Glaucoma   . 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 Hearing loss   . HEMORRHOIDS, EXTERNAL W/O COMPLICATION 7/74/1287   Qualifier: Diagnosis of  By: Jimmye Norman, LPN, [place] Cunas   . HYPERLIPIDEMIA 09/22/2007  . HYPERTENSION 03/11/2007  . Kidney stone   . Osteopenia 02/2015   T score -1.4 FRAX 7.3%/0.6%  . Rectocele   . Rosacea 03/11/2007  . Tachycardia      Social History   Socioeconomic History  . Marital status: Widowed    Spouse name: Not on file  . Number of children: Not on file  . Years of education: Not on file  . Highest education level: Not on file  Occupational History  . Occupation: retired    Fish farm manager: RETIRED  Social Needs  . Financial resource strain: Not on file  . Food  insecurity    Worry: Not on file    Inability: Not on file  . Transportation needs    Medical: Not on file    Non-medical: Not on file  Tobacco Use  . Smoking status: Never Smoker  . Smokeless tobacco: Never Used  Substance and Sexual Activity  . Alcohol use: No    Alcohol/week: 0.0 standard drinks  . Drug use: No  . Sexual activity: Never    Birth control/protection: Post-menopausal    Comment: 1st intercourse 78 yo-Fewer than 5 partners  Lifestyle  . Physical activity    Days per week: Not on file    Minutes per session: Not on file  . Stress: Not on file  Relationships  . Social Herbalist on phone: Not on file    Gets together: Not on file    Attends religious service: Not on file    Active member of club or organization: Not on file    Attends meetings of clubs or organizations: Not on file    Relationship status: Not on file  . Intimate partner violence    Fear of current or ex partner: Not on file    Emotionally abused: Not on file    Physically abused: Not on file    Forced sexual activity: Not on file  Other Topics Concern  .  Not on file  Social History Narrative   Lives with grown son. Widowed. 5 children and 7 grandchildren.       Retired from [employer] after 5 years and later took care of children.       Hobbies: time with grandkids, shopping, go out to eat. Travels once a year with girlfriends.     Past Surgical History:  Procedure Laterality Date  . ANTERIOR CERVICAL DECOMP/DISCECTOMY FUSION  08/14/2011   Procedure: ANTERIOR CERVICAL DECOMPRESSION/DISCECTOMY FUSION 1 LEVEL/HARDWARE REMOVAL;  Surgeon: [name], MD;  Location: [facility];  Service: Neurosurgery;  Laterality: N/A;  Cervical Six-Seven anterior cervical decompression with fusion interbody prothesis plating and bonegraft with removal of hardware of Cervical Four to Cervical Six   . CERVICAL FUSION     C4-6  . COLONOSCOPY     multiple  . ESOPHAGOGASTRODUODENOSCOPY  10/14/2012    Dr. [name]  . KIDNEY SURGERY     age 22  . TONSILLECTOMY      Family History  Problem Relation Age of Onset  . Mental illness Mother        comitted suicide  . Glaucoma Mother   . Lung cancer Father        smoker  . Hypertension Father   . Heart disease Father        age 63, smoker  . Cerebral palsy Sister   . Stroke Sister        multiple  . Diabetes Maternal Grandmother   . Stroke Paternal Aunt   . Stroke Paternal Uncle   . Stroke Paternal Uncle   . Stroke Paternal Uncle   . Stroke Paternal Aunt   . Pancreatic cancer Daughter   . Colon cancer Neg Hx   . Stomach cancer Neg Hx     Allergies  Allergen Reactions  . Codeine Nausea Only    Current Medications:   Current Outpatient Medications:  .  acetaminophen (TYLENOL) 325 MG tablet, Take 650 mg by mouth every 6 (six) hours as needed for moderate pain., Disp: , Rfl:  .  ALPRAZolam (XANAX) 0.25 MG tablet, Take 1 tablet (0.25 mg total) by mouth daily as needed., Disp: 30 tablet, Rfl: 0 .  BIOTIN PO, Take 1 capsule by mouth daily. , Disp: , Rfl:  .  Calcium Carbonate-Vitamin D (CALCIUM + D PO), Take 1 tablet by mouth daily.  , Disp: , Rfl:  .  diclofenac (VOLTAREN) 75 MG EC tablet, Take 1 tablet (75 mg total) by mouth 2 (two) times daily., Disp: 14 tablet, Rfl: 0 .  diltiazem (CARTIA XT) 240 MG 24 hr capsule, Take 1 capsule (240 mg total) by mouth daily., Disp: 90 capsule, Rfl: 2 .  dorzolamide-timolol (COSOPT) 22.3-6.8 MG/ML ophthalmic solution, Place 1 drop into both eyes 2 (two) times daily., Disp: , Rfl:  .  escitalopram (LEXAPRO) 20 MG tablet, TAKE 1 TABLET BY MOUTH EVERY DAY, Disp: 90 tablet, Rfl: 1 .  hydrochlorothiazide (HYDRODIURIL) 25 MG tablet, TAKE 1 TABLET BY MOUTH EVERY DAY, Disp: 90 tablet, Rfl: 0 .  pantoprazole (PROTONIX) 40 MG tablet, TAKE 1 TABLET BY MOUTH EVERY DAY BEFORE BREAKFAST, Disp: 90 tablet, Rfl: 0 .  PEPPERMINT OIL PO, Take 2 capsules by mouth 3 (three) times daily as needed. IB Gard,  Disp: , Rfl:  .  valsartan (DIOVAN) 320 MG tablet, Take 1 tablet (320 mg total) by mouth daily., Disp: 90 tablet, Rfl: 3 .  ondansetron (ZOFRAN) 4 MG tablet, Take 1 tablet (4  mg total) by mouth every 8 (eight) hours as needed for nausea or vomiting., Disp: 20 tablet, Rfl: 0   Review of Systems:   ROS Negative unless otherwise specified per HPI.  Vitals:   Vitals:   03/14/19 1055  BP: 130/80  Pulse: 60  Temp: 97.9 F (36.6 C)  TempSrc: Temporal  SpO2: 96%  Weight: 120 lb 6.1 oz (54.6 kg)  Height: 4' 11.75" (1.518 m)     Body mass index is 23.71 kg/m.  Physical Exam:   Physical Exam Vitals signs and nursing note reviewed.  Constitutional:      General: She is not in acute distress.    Appearance: She is well-developed. She is not ill-appearing or toxic-appearing.  HENT:     Right Ear: Tympanic membrane, ear canal and external ear normal.     Left Ear: Tympanic membrane, ear canal and external ear normal.     Ears:     Comments: Trace amount of clear fluid in bilateral TM Cardiovascular:     Rate and Rhythm: Normal rate and regular rhythm.     Pulses: Normal pulses.     Heart sounds: Normal heart sounds, S1 normal and S2 normal.     Comments: No LE edema Pulmonary:     Effort: Pulmonary effort is normal.     Breath sounds: Normal breath sounds.  Skin:    General: Skin is warm and dry.  Neurological:     Mental Status: She is alert.     GCS: GCS eye subscore is 4. GCS verbal subscore is 5. GCS motor subscore is 6.  Psychiatric:        Speech: Speech normal.        Behavior: Behavior normal. Behavior is cooperative.     Assessment and Plan:   Venda was seen today for otalgia.  Diagnoses and all orders for this visit:  Right ear pain -     Novel Coronavirus, NAA ([facility])   Suspect ETD or vertigo. Vitals stable, appears hemodynamically stable. Exam relatively normal, however she is having COVID-19 symptoms including: fatigue, nausea, headache. Recommended  COVID-19 testing, oral zofran, pushing fluids and eating bland meals. If any changes in symptoms, needs ER or urgent care due to extremely limited amount of PPE available in our office, we are unable to check labs at this time.  Patient has a respiratory illness without signs of acute distress or respiratory compromise at this time. This is likely a viral infection, which can come from a number of respiratory viruses. We are going to send patient for drive-up testing. As a precaution, they have been advised to remain home until COVID-19 results and then possible further quarantine after that based on results and symptoms. Advised if they experience a "second sickening" or worsening symptoms as the illness progresses, they are to call the office for further instructions or seek emergent evaluation for any severe symptoms.    Other orders -     ondansetron (ZOFRAN) 4 MG tablet; Take 1 tablet (4 mg total) by mouth every 8 (eight) hours as needed for nausea or vomiting.  . Reviewed expectations re: course of current medical issues. . Discussed self-management of symptoms. . Outlined signs and symptoms indicating need for more acute intervention. . Patient verbalized understanding and all questions were answered. . See orders for this visit as documented in the electronic medical record. . Patient received an After-Visit Summary.  CMA or LPN served as scribe during this visit. History, Physical, and Plan  performed by medical provider. The above documentation has been reviewed and is accurate and complete.  Inda Coke, PA-C

## 2019-03-15 LAB — NOVEL CORONAVIRUS, NAA: SARS-CoV-2, NAA: NOT DETECTED

## 2019-03-16 ENCOUNTER — Other Ambulatory Visit: Payer: Self-pay | Admitting: Physician Assistant

## 2019-03-16 ENCOUNTER — Ambulatory Visit: Payer: Medicare Other | Admitting: Physician Assistant

## 2019-03-16 DIAGNOSIS — R5383 Other fatigue: Secondary | ICD-10-CM

## 2019-03-16 DIAGNOSIS — R739 Hyperglycemia, unspecified: Secondary | ICD-10-CM

## 2019-03-17 ENCOUNTER — Other Ambulatory Visit (INDEPENDENT_AMBULATORY_CARE_PROVIDER_SITE_OTHER): Payer: Medicare Other

## 2019-03-17 ENCOUNTER — Other Ambulatory Visit: Payer: Self-pay | Admitting: Family Medicine

## 2019-03-17 DIAGNOSIS — R5383 Other fatigue: Secondary | ICD-10-CM

## 2019-03-17 DIAGNOSIS — R739 Hyperglycemia, unspecified: Secondary | ICD-10-CM | POA: Diagnosis not present

## 2019-03-17 LAB — CBC WITH DIFFERENTIAL/PLATELET
Basophils Absolute: 0 10*3/uL (ref 0.0–0.1)
Basophils Relative: 0.4 % (ref 0.0–3.0)
Eosinophils Absolute: 0.1 10*3/uL (ref 0.0–0.7)
Eosinophils Relative: 0.8 % (ref 0.0–5.0)
HCT: 40.3 % (ref 36.0–46.0)
Hemoglobin: 13.5 g/dL (ref 12.0–15.0)
Lymphocytes Relative: 29 % (ref 12.0–46.0)
Lymphs Abs: 3.2 10*3/uL (ref 0.7–4.0)
MCHC: 33.6 g/dL (ref 30.0–36.0)
MCV: 93.3 fl (ref 78.0–100.0)
Monocytes Absolute: 0.7 10*3/uL (ref 0.1–1.0)
Monocytes Relative: 6.4 % (ref 3.0–12.0)
Neutro Abs: 7 10*3/uL (ref 1.4–7.7)
Neutrophils Relative %: 63.4 % (ref 43.0–77.0)
Platelets: 266 10*3/uL (ref 150.0–400.0)
RBC: 4.33 Mil/uL (ref 3.87–5.11)
RDW: 12.4 % (ref 11.5–15.5)
WBC: 11.1 10*3/uL — ABNORMAL HIGH (ref 4.0–10.5)

## 2019-03-17 LAB — COMPREHENSIVE METABOLIC PANEL
ALT: 12 U/L (ref 0–35)
AST: 13 U/L (ref 0–37)
Albumin: 4.2 g/dL (ref 3.5–5.2)
Alkaline Phosphatase: 56 U/L (ref 39–117)
BUN: 21 mg/dL (ref 6–23)
CO2: 30 mEq/L (ref 19–32)
Calcium: 9.5 mg/dL (ref 8.4–10.5)
Chloride: 98 mEq/L (ref 96–112)
Creatinine, Ser: 0.96 mg/dL (ref 0.40–1.20)
GFR: 55.89 mL/min — ABNORMAL LOW (ref >60.00)
Glucose, Bld: 90 mg/dL (ref 70–99)
Potassium: 3.6 mEq/L (ref 3.5–5.1)
Sodium: 136 mEq/L (ref 135–145)
Total Bilirubin: 0.7 mg/dL (ref 0.2–1.2)
Total Protein: 6.4 g/dL (ref 6.0–8.3)

## 2019-03-17 LAB — TSH: TSH: 1.05 u[IU]/mL (ref 0.35–4.50)

## 2019-03-17 LAB — HEMOGLOBIN A1C: Hgb A1c MFr Bld: 6.6 % — ABNORMAL HIGH (ref 4.6–6.5)

## 2019-03-18 NOTE — Telephone Encounter (Signed)
Called pt no answer. Will try again shortly.  

## 2019-03-18 NOTE — Telephone Encounter (Signed)
Patient need to schedule an ov for more refills. 

## 2019-03-21 ENCOUNTER — Telehealth: Payer: Self-pay | Admitting: Family Medicine

## 2019-03-21 DIAGNOSIS — H9201 Otalgia, right ear: Secondary | ICD-10-CM

## 2019-03-21 NOTE — Telephone Encounter (Signed)
I am also willing to see her at 9 AM on Tuesday if you can reach early enough.  I am sending to Le[place] as she would need to help opening the spot.

## 2019-03-21 NOTE — Telephone Encounter (Signed)
Copied from [place] [id number]. Topic: Quick Communication - Rx Refill/Question >> Mar 21, 2019  9:49 AM Erick Blinks wrote: Medication: ondansetron (ZOFRAN) 4 MG tablet Pt called reporting vomiting due to Rx, seeking alternative assistance for right inner ear complications. Please advise [phone]

## 2019-03-21 NOTE — Telephone Encounter (Signed)
Placed referral but pt declined and stated that she wants an ov. Pt has been scheduled for Wed. PT declined seeing any other physician. Pt did mention that she was told fluid was in her Right ear at last office visit. Pt thinks she may have vertigo or ear infection.

## 2019-03-21 NOTE — Telephone Encounter (Signed)
Since covid 19 is negative- lets refer to ENT urgent under right ear pain  Tell her stop zofran- push to remain well hydrated. zofran is anti nausea medicine so not sure if its actually causing vomiting but lets see hwo she does without it.   If it takes more than a few days referral- we may need to see her in office.   Hold valsartan and hydrochlorothiazide for now as long as home BP remains <150/90 until nausea/vomiting resolve

## 2019-03-21 NOTE — Addendum Note (Signed)
Addended by: Gwenyth Ober R on: 03/21/2019 02:24 PM   Modules accepted: Orders

## 2019-03-21 NOTE — Telephone Encounter (Signed)
Please advise 

## 2019-03-22 NOTE — Telephone Encounter (Signed)
Called and spoke with patient. She will keep appointment scheduled for tomorrow at 10:00 AM as she is unable to make it to the office within the next 30 mins.

## 2019-03-23 ENCOUNTER — Encounter: Payer: Self-pay | Admitting: Family Medicine

## 2019-03-23 ENCOUNTER — Ambulatory Visit (INDEPENDENT_AMBULATORY_CARE_PROVIDER_SITE_OTHER): Payer: Medicare Other | Admitting: Family Medicine

## 2019-03-23 ENCOUNTER — Other Ambulatory Visit: Payer: Self-pay

## 2019-03-23 VITALS — BP 140/82 | HR 74 | Temp 98.1°F | Ht 59.75 in | Wt 120.8 lb

## 2019-03-23 DIAGNOSIS — H8110 Benign paroxysmal vertigo, unspecified ear: Secondary | ICD-10-CM

## 2019-03-23 DIAGNOSIS — E119 Type 2 diabetes mellitus without complications: Secondary | ICD-10-CM | POA: Diagnosis not present

## 2019-03-23 DIAGNOSIS — R112 Nausea with vomiting, unspecified: Secondary | ICD-10-CM

## 2019-03-23 DIAGNOSIS — I471 Supraventricular tachycardia: Secondary | ICD-10-CM

## 2019-03-23 DIAGNOSIS — I10 Essential (primary) hypertension: Secondary | ICD-10-CM | POA: Diagnosis not present

## 2019-03-23 LAB — CBC WITH DIFFERENTIAL/PLATELET
Basophils Absolute: 0 10*3/uL (ref 0.0–0.1)
Basophils Relative: 0.5 % (ref 0.0–3.0)
Eosinophils Absolute: 0.1 10*3/uL (ref 0.0–0.7)
Eosinophils Relative: 1.1 % (ref 0.0–5.0)
HCT: 40.3 % (ref 36.0–46.0)
Hemoglobin: 13.6 g/dL (ref 12.0–15.0)
Lymphocytes Relative: 33 % (ref 12.0–46.0)
Lymphs Abs: 2.6 10*3/uL (ref 0.7–4.0)
MCHC: 33.6 g/dL (ref 30.0–36.0)
MCV: 93 fl (ref 78.0–100.0)
Monocytes Absolute: 0.6 10*3/uL (ref 0.1–1.0)
Monocytes Relative: 7.2 % (ref 3.0–12.0)
Neutro Abs: 4.7 10*3/uL (ref 1.4–7.7)
Neutrophils Relative %: 58.2 % (ref 43.0–77.0)
Platelets: 264 10*3/uL (ref 150.0–400.0)
RBC: 4.34 Mil/uL (ref 3.87–5.11)
RDW: 12.7 % (ref 11.5–15.5)
WBC: 8 10*3/uL (ref 4.0–10.5)

## 2019-03-23 LAB — BASIC METABOLIC PANEL
BUN: 17 mg/dL (ref 6–23)
CO2: 30 mEq/L (ref 19–32)
Calcium: 9.6 mg/dL (ref 8.4–10.5)
Chloride: 97 mEq/L (ref 96–112)
Creatinine, Ser: 0.77 mg/dL (ref 0.40–1.20)
GFR: 72.09 mL/min (ref >60.00)
Glucose, Bld: 106 mg/dL — ABNORMAL HIGH (ref 70–99)
Potassium: 4 mEq/L (ref 3.5–5.1)
Sodium: 136 mEq/L (ref 135–145)

## 2019-03-23 MED ORDER — VALSARTAN 320 MG PO TABS: 320.0000 mg | ORAL_TABLET | Freq: Every day | ORAL | 3 refills | Status: DC

## 2019-03-23 MED ORDER — DILTIAZEM HCL ER COATED BEADS 240 MG PO CP24: ORAL_CAPSULE | ORAL | 3 refills | Status: DC

## 2019-03-23 MED ORDER — MECLIZINE HCL 25 MG PO TABS: 25.0000 mg | ORAL_TABLET | Freq: Three times a day (TID) | ORAL | 0 refills | Status: DC | PRN

## 2019-03-23 MED ORDER — HYDROCHLOROTHIAZIDE 25 MG PO TABS: 25.0000 mg | ORAL_TABLET | Freq: Every day | ORAL | 3 refills | Status: DC

## 2019-03-23 NOTE — Progress Notes (Signed)
Phone [phone]   Subjective:  [name] is a 78 y.o. year old very pleasant female patient who presents for/with See problem oriented charting Chief Complaint  Patient presents with   Dizziness   ROS- No facial or extremity weakness. No slurred words or trouble swallowing. no blurry vision or double vision. No paresthesias. No confusion or word finding difficulties.  No fever chills reported  Past Medical History-  Patient Active Problem List   Diagnosis Date Noted   Diabetes mellitus without complication ([place]) 33/29/5188    Priority: High   SVT (supraventricular tachycardia) ([place]) 08/14/2011    Priority: High   GERD (gastroesophageal reflux disease) 07/14/2014    Priority: Medium   Primary open angle glaucoma of both eyes, mild stage 05/29/2014    Priority: Medium   IBS (irritable bowel syndrome)- diarrhea predominant 01/26/2013    Priority: Medium   Glaucoma associated with ocular disorder 11/05/2009    Priority: Medium   Hyperlipidemia 09/22/2007    Priority: Medium   Depression, major, recurrent, in partial remission ([place]). Lexapro 20mg . Xanax once a week.  03/11/2007    Priority: Medium   Essential hypertension 03/11/2007    Priority: Medium   History of colonic polyps 11/28/2011    Priority: Low   Cervical disc disorder with radiculopathy of cervical region 11/05/2009    Priority: Low   Rosacea 03/11/2007    Priority: Low   Knee pain, left 11/02/2013   Senile nuclear sclerosis 06/29/2011    Medications- reviewed and updated Current Outpatient Medications  Medication Sig Dispense Refill   acetaminophen (TYLENOL) 325 MG tablet Take 650 mg by mouth every 6 (six) hours as needed for moderate pain.     ALPRAZolam (XANAX) 0.25 MG tablet Take 1 tablet (0.25 mg total) by mouth daily as needed. 30 tablet 0   BIOTIN PO Take 1 capsule by mouth daily.      Calcium Carbonate-Vitamin D (CALCIUM + D PO) Take 1 tablet by mouth daily.        diclofenac (VOLTAREN) 75 MG EC tablet Take 1 tablet (75 mg total) by mouth 2 (two) times daily. 14 tablet 0   dorzolamide-timolol (COSOPT) 22.3-6.8 MG/ML ophthalmic solution Place 1 drop into both eyes 2 (two) times daily.     escitalopram (LEXAPRO) 20 MG tablet TAKE 1 TABLET BY MOUTH EVERY DAY 90 tablet 1   pantoprazole (PROTONIX) 40 MG tablet TAKE 1 TABLET BY MOUTH EVERY DAY BEFORE BREAKFAST 90 tablet 0   PEPPERMINT OIL PO Take 2 capsules by mouth 3 (three) times daily as needed. IB Gard     valsartan (DIOVAN) 320 MG tablet Take 1 tablet (320 mg total) by mouth daily. 90 tablet 3  Holding hydrochlorothiazide.  Unclear why diltiazem dropped off list- refilled this. No current facility-administered medications for this visit.      Objective:  BP 140/82 (BP Location: Left Arm, Patient Position: Sitting, Cuff Size: Normal)    Pulse 74    Temp 98.1 F (36.7 C) (Oral)    Ht 4' 11.75" (1.518 m)    Wt 120 lb 12.8 oz (54.8 kg)    LMP 08/12/1991    SpO2 98%    BMI 23.79 kg/m  Gen: NAD, resting comfortably CV: RRR no murmurs rubs or gallops Lungs: CTAB no crackles, wheeze, rhonchi Abdomen: soft/nontender/nondistended/normal bowel sounds. No rebound or guarding.  Ext: no edema Skin: warm, dry Neuro: CN II-XII intact, sensation and reflexes normal throughout, 5/5 muscle strength in bilateral upper and lower extremities.  Normal finger to nose. Normal rapid alternating movements. No pronator drift. Normal romberg. Normal gait.       Assessment and Plan   # Vertigo S:Sx started 1-2 weeks ago. Recurrent episodes with head movement of room spinning sensation, felt very nauseous with this. Felt like bed itself was moving at times if she moved.  As soon as she would lay down would get better- would recur if moved again- could bother her if rolled over. Vomited in bed one time.      No vomiting since this past saturday. Sx improve when lying down. Had fluid in right ear when seen Inda Coke, PA.   Has mild pressure in right eat. Has been feeling off balance and has intermittent nausea. Denies fever.   She has been lying around resting- still feels exhausted from episode. Last episode of room spinning was staurday night. Feels slightly off balance now but no severe vertigo episodes. Right ear pain has resolved. Had tried mucinex but that made her vomit- to help get fluid out of her ear.   COVID-19 testing was negative on 8/3.  A/P: Sounds like BPPV with recurrence with predictable head movements.  I also wonder after her episodes of vomiting if she had an element of orthostatic intolerance-encouraged her to stay well-hydrated to make up for prior losses.  I did send in meclizine for her though told her likely just available over-the-counter as well.  She can use this if has recurrence.  No obvious other stroke symptoms and neurological exam is reassuring today.  No tenderness or hearing loss to suggest Mnire's.  Not a persistent episode to suggest vestibular neuritis.  Doubt stroke as per above -Advised follow-up precautions if she has recurrence  #hypertension/SVT S: Blood pressure controlled on diltiazem alone-no obvious recurrence of SVT on this.  Patient is currently holding valsartan 320 mg, hctz 25 mg due to my directions for recent diarrhea BP Readings from Last 3 Encounters:  03/23/19 140/82  03/14/19 130/80  08/26/18 134/68  A/P: SVT well-controlled-continue diltiazem-refilled today.  For her blood pressure- From [facility] " Hold off on starting the valsartan and hydrochlorothiazide until you hear back about labs. Can restart diltiazem now. For valsartan and hydrochlorothiazide- take just a half tablet each day for 1 week then restart full pill. Let me know if you get lightheaded with standing. Make sure to stay well hydrated ".  Holding off on starting blood pressure medicine until we make sure kidney function did not worsen related to vomiting  # Diabetes S: New diagnosis.  Elevated  A1c on August 6 for second time in a year. Lab Results  Component Value Date   HGBA1C 6.6 (H) 03/17/2019   HGBA1C 6.3 08/16/2018   HGBA1C 6.8 (H) 08/31/2017   A/P: With her current level do not have to start medication-discussed healthy eating and regular exercise- she will follow-up in 4 months to make sure stable- will work on health maintenance related to diabetes at that time  Recommended follow up: 41-month or sooner if needed for recurrence of vertigo Future Appointments  Date Time Provider [place]  07/21/2019 11:00 AM [name], MD [facility]   Lab/Order associations:   ICD-10-CM   1. Benign paroxysmal positional vertigo, unspecified laterality  H81.10   2. Essential hypertension  I10   3. Diabetes mellitus without complication ([place])  M42.6   4. SVT (supraventricular tachycardia) ([facility]) Chronic I47.1   5. Nausea and vomiting, intractability of vomiting not specified, unspecified vomiting type  R11.2 CBC with Differential/Platelet    Basic metabolic panel    Meds ordered this encounter  Medications   valsartan (DIOVAN) 320 MG tablet    Sig: Take 1 tablet (320 mg total) by mouth daily.    Dispense:  90 tablet    Refill:  3   hydrochlorothiazide (HYDRODIURIL) 25 MG tablet    Sig: Take 1 tablet (25 mg total) by mouth daily.    Dispense:  90 tablet    Refill:  3    Needs appointment before next refill   diltiazem (CARDIZEM CD) 240 MG 24 hr capsule    Sig: TAKE 1 CAPSULE BY MOUTH EVERY DAY    Dispense:  90 capsule    Refill:  3   meclizine (ANTIVERT) 25 MG tablet    Sig: Take 1 tablet (25 mg total) by mouth 3 (three) times daily as needed for dizziness or nausea (vertigo).    Dispense:  30 tablet    Refill:  0   Return precautions advised.  Garret Reddish, MD

## 2019-03-23 NOTE — Assessment & Plan Note (Signed)
S: New diagnosis.  Elevated A1c on August 6 for second time in a year. Lab Results  Component Value Date   HGBA1C 6.6 (H) 03/17/2019   HGBA1C 6.3 08/16/2018   HGBA1C 6.8 (H) 08/31/2017   A/P: With her current level do not have to start medication-discussed healthy eating and regular exercise- she will follow-up in 4 months to make sure stable- will work on health maintenance related to diabetes at that time

## 2019-03-23 NOTE — Patient Instructions (Addendum)
Health Maintenance Due  Topic Date Due  . INFLUENZA VACCINE  03/12/2019  We should have flu shots available by September. Please strongly consider getting flu shot this year. If you get your flu shot at a pharmacy- please let us know.   Please stop by lab before you go If you do not have mychart- we will call you about results within 5 business days of [place] receiving them.  If you have mychart- we will send your results within 3 business days of [place] receiving them.  If abnormal or we want to clarify a result, we will call or mychart you to make sure you receive the message.  If you have questions or concerns or don't hear within 5-7 days, please send [place] a message or call us.    Hold off on starting the valsartan and hydrochlorothiazide until you hear back about labs. Can restart diltiazem now. For valsartan and hydrochlorothiazide- take just a half tablet each day for 1 week then restart full pill. Let me know if you get lightheaded with standing. Make sure to stay well hydrated  4 month follow up or sooner if you need [place]. We will recheck the diabetes #- no medicine needed right now but make sure to eat a healthy diet and exercise regularly when you feel beter   Can try meclizine if symptoms come back- please let us know if that occurs    Benign Positional Vertigo Vertigo is the feeling that you or your surroundings are moving when they are not. Benign positional vertigo is the most common form of vertigo. This is usually a harmless condition (benign). This condition is positional. This means that symptoms are triggered by certain movements and positions. This condition can be dangerous if it occurs while you are doing something that could cause harm to you or others. This includes activities such as driving or operating machinery. What are the causes? In many cases, the cause of this condition is not known. It may be caused by a disturbance in an area of the inner ear that helps your brain  to sense movement and balance. This disturbance can be caused by:  Viral infection (labyrinthitis).  Head injury.  Repetitive motion, such as jumping, dancing, or running. What increases the risk? You are more likely to develop this condition if:  You are a woman.  You are 34 years of age or older. What are the signs or symptoms? Symptoms of this condition usually happen when you move your head or your eyes in different directions. Symptoms may start suddenly, and usually last for less than a minute. They include:  Loss of balance and falling.  Feeling like you are spinning or moving.  Feeling like your surroundings are spinning or moving.  Nausea and vomiting.  Blurred vision.  Dizziness.  Involuntary eye movement (nystagmus). Symptoms can be mild and cause only minor problems, or they can be severe and interfere with daily life. Episodes of benign positional vertigo may return (recur) over time. Symptoms may improve over time. How is this diagnosed? This condition may be diagnosed based on:  Your medical history.  Physical exam of the head, neck, and ears.  Tests, such as: ? MRI. ? CT scan. ? Eye movement tests. Your health care provider may ask you to change positions quickly while he or she watches you for symptoms of benign positional vertigo, such as nystagmus. Eye movement may be tested with a variety of exams that are designed to evaluate or  stimulate vertigo. ? An electroencephalogram (EEG). This records electrical activity in your brain. ? Hearing tests. You may be referred to a health care provider who specializes in ear, nose, and throat (ENT) problems (otolaryngologist) or a provider who specializes in disorders of the nervous system (neurologist). How is this treated?  This condition may be treated in a session in which your health care provider moves your head in specific positions to adjust your inner ear back to normal. Treatment for this condition may  take several sessions. Surgery may be needed in severe cases, but this is rare. In some cases, benign positional vertigo may resolve on its own in 2-4 weeks. Follow these instructions at home: Safety  Move slowly. Avoid sudden body or head movements or certain positions, as told by your health care provider.  Avoid driving until your health care provider says it is safe for you to do so.  Avoid operating heavy machinery until your health care provider says it is safe for you to do so.  Avoid doing any tasks that would be dangerous to you or others if vertigo occurs.  If you have trouble walking or keeping your balance, try using a cane for stability. If you feel dizzy or unstable, sit down right away.  Return to your normal activities as told by your health care provider. Ask your health care provider what activities are safe for you. General instructions  Take over-the-counter and prescription medicines only as told by your health care provider.  Drink enough fluid to keep your urine pale yellow.  Keep all follow-up visits as told by your health care provider. This is important. Contact a health care provider if:  You have a fever.  Your condition gets worse or you develop new symptoms.  Your family or friends notice any behavioral changes.  You have nausea or vomiting that gets worse.  You have numbness or a "pins and needles" sensation. Get help right away if you:  Have difficulty speaking or moving.  Are always dizzy.  Faint.  Develop severe headaches.  Have weakness in your legs or arms.  Have changes in your hearing or vision.  Develop a stiff neck.  Develop sensitivity to light. Summary  Vertigo is the feeling that you or your surroundings are moving when they are not. Benign positional vertigo is the most common form of vertigo.  The cause of this condition is not known. It may be caused by a disturbance in an area of the inner ear that helps your brain  to sense movement and balance.  Symptoms include loss of balance and falling, feeling that you or your surroundings are moving, nausea and vomiting, and blurred vision.  This condition can be diagnosed based on symptoms, physical exam, and other tests, such as MRI, CT scan, eye movement tests, and hearing tests.  Follow safety instructions as told by your health care provider. You will also be told when to contact your health care provider in case of problems. This information is not intended to replace advice given to you by your health care provider. Make sure you discuss any questions you have with your health care provider. Document Released: 05/05/2006 Document Revised: 01/06/2018 Document Reviewed: 01/06/2018 Elsevier Patient Education  2020 [employer].

## 2019-05-19 ENCOUNTER — Encounter: Payer: Self-pay | Admitting: Gynecology

## 2019-05-23 ENCOUNTER — Other Ambulatory Visit: Payer: Self-pay

## 2019-05-23 MED ORDER — PANTOPRAZOLE SODIUM 40 MG PO TBEC: DELAYED_RELEASE_TABLET | ORAL | 0 refills | Status: DC

## 2019-05-23 NOTE — Telephone Encounter (Signed)
Pantoprazole refilled as pharmacy requested. 

## 2019-06-17 LAB — HM MAMMOGRAPHY

## 2019-06-20 ENCOUNTER — Telehealth: Payer: Self-pay | Admitting: Internal Medicine

## 2019-06-20 NOTE — Telephone Encounter (Signed)
Pt reported that she has been having N/V and abd pain.  Please advise.

## 2019-06-20 NOTE — Telephone Encounter (Signed)
Patient will come in and see Amy Esterwood PA for persistent vomiting since last week.

## 2019-06-22 ENCOUNTER — Other Ambulatory Visit: Payer: Self-pay

## 2019-06-22 ENCOUNTER — Encounter: Payer: Self-pay | Admitting: Physician Assistant

## 2019-06-22 ENCOUNTER — Ambulatory Visit (INDEPENDENT_AMBULATORY_CARE_PROVIDER_SITE_OTHER): Payer: Medicare Other | Admitting: Physician Assistant

## 2019-06-22 ENCOUNTER — Other Ambulatory Visit (INDEPENDENT_AMBULATORY_CARE_PROVIDER_SITE_OTHER): Payer: Medicare Other

## 2019-06-22 VITALS — BP 106/66 | HR 72 | Ht 61.0 in | Wt 118.0 lb

## 2019-06-22 DIAGNOSIS — R112 Nausea with vomiting, unspecified: Secondary | ICD-10-CM

## 2019-06-22 DIAGNOSIS — R109 Unspecified abdominal pain: Secondary | ICD-10-CM

## 2019-06-22 LAB — CBC WITH DIFFERENTIAL/PLATELET
Basophils Absolute: 0 10*3/uL (ref 0.0–0.1)
Basophils Relative: 0.6 % (ref 0.0–3.0)
Eosinophils Absolute: 0.1 10*3/uL (ref 0.0–0.7)
Eosinophils Relative: 0.8 % (ref 0.0–5.0)
HCT: 39.8 % (ref 36.0–46.0)
Hemoglobin: 13.3 g/dL (ref 12.0–15.0)
Lymphocytes Relative: 35.2 % (ref 12.0–46.0)
Lymphs Abs: 2.4 10*3/uL (ref 0.7–4.0)
MCHC: 33.5 g/dL (ref 30.0–36.0)
MCV: 94.2 fl (ref 78.0–100.0)
Monocytes Absolute: 0.5 10*3/uL (ref 0.1–1.0)
Monocytes Relative: 7.8 % (ref 3.0–12.0)
Neutro Abs: 3.8 10*3/uL (ref 1.4–7.7)
Neutrophils Relative %: 55.6 % (ref 43.0–77.0)
Platelets: 273 10*3/uL (ref 150.0–400.0)
RBC: 4.22 Mil/uL (ref 3.87–5.11)
RDW: 12.6 % (ref 11.5–15.5)
WBC: 6.9 10*3/uL (ref 4.0–10.5)

## 2019-06-22 LAB — COMPREHENSIVE METABOLIC PANEL
ALT: 11 U/L (ref 0–35)
AST: 11 U/L (ref 0–37)
Albumin: 4.3 g/dL (ref 3.5–5.2)
Alkaline Phosphatase: 54 U/L (ref 39–117)
BUN: 17 mg/dL (ref 6–23)
CO2: 32 mEq/L (ref 19–32)
Calcium: 9.4 mg/dL (ref 8.4–10.5)
Chloride: 100 mEq/L (ref 96–112)
Creatinine, Ser: 0.92 mg/dL (ref 0.40–1.20)
GFR: 58.67 mL/min — ABNORMAL LOW (ref >60.00)
Glucose, Bld: 131 mg/dL — ABNORMAL HIGH (ref 70–99)
Potassium: 3.7 mEq/L (ref 3.5–5.1)
Sodium: 140 mEq/L (ref 135–145)
Total Bilirubin: 0.7 mg/dL (ref 0.2–1.2)
Total Protein: 6.9 g/dL (ref 6.0–8.3)

## 2019-06-22 LAB — SEDIMENTATION RATE: Sed Rate: 20 mm/hr (ref 0–30)

## 2019-06-22 NOTE — Progress Notes (Signed)
Subjective:    Patient ID: [name], female    DOB: 1940-10-28, 78 y.o.   MRN: [id number]  HPI [name] is a pleasant 78 year old white female, established with Dr. Carlean Purl and last seen in our office in March 2019.  She has history of hypertension, SVT, IBS, GERD, adult onset diabetes mellitus, history of colon polyps and diverticulosis. She last had EGD in 2014 and colonoscopy in 2013. She comes in today with symptoms onset in August.  She says she was seen by her PCP and treated for what sounds like diverticulitis.  She says her symptoms improved and she got better for a while and then started having recurrent issues with abdominal discomfort.  She says last week she had a bad week with intermittent headache, dizziness, nausea vomiting and fairly generalized abdominal pain.  She never developed any diarrhea, no melena or hematochezia and no fever or chills.  Appetite overall has been decreased and she is down about 5 pounds. She said she started to feel a little bit better this past weekend and then yesterday had an episode of what she describes as "terrible" abdominal pain that started after she went to bed.  She describes it as generalized and crampy in nature.  She did have to get up to have a bowel movement which was not diarrhea.  No nausea or vomiting with the episode last night.  Today she is feeling better, but says something is not right. She had not started any new medications recently. No recent imaging.  Review of Systems Pertinent positive and negative review of systems were noted in the above HPI section.  All other review of systems was otherwise negative.  Outpatient Encounter Medications as of 06/22/2019  Medication Sig  . acetaminophen (TYLENOL) 325 MG tablet Take 650 mg by mouth every 6 (six) hours as needed for moderate pain.  [name] ALPRAZolam (XANAX) 0.25 MG tablet Take 1 tablet (0.25 mg total) by mouth daily as needed.  . Calcium Carbonate-Vitamin D (CALCIUM + D PO) Take  1 tablet by mouth daily.    [name] diltiazem (CARDIZEM CD) 240 MG 24 hr capsule TAKE 1 CAPSULE BY MOUTH EVERY DAY  . dorzolamide-timolol (COSOPT) 22.3-6.8 MG/ML ophthalmic solution Place 1 drop into both eyes 2 (two) times daily.  [name] escitalopram (LEXAPRO) 20 MG tablet TAKE 1 TABLET BY MOUTH EVERY DAY  . hydrochlorothiazide (HYDRODIURIL) 25 MG tablet Take 1 tablet (25 mg total) by mouth daily.  . meclizine (ANTIVERT) 25 MG tablet Take 1 tablet (25 mg total) by mouth 3 (three) times daily as needed for dizziness or nausea (vertigo).  . pantoprazole (PROTONIX) 40 MG tablet TAKE 1 TABLET BY MOUTH EVERY DAY BEFORE BREAKFAST  . PEPPERMINT OIL PO Take 2 capsules by mouth 3 (three) times daily as needed. IB Gard  . valsartan (DIOVAN) 320 MG tablet Take 1 tablet (320 mg total) by mouth daily.  . [DISCONTINUED] BIOTIN PO Take 1 capsule by mouth daily.   . [DISCONTINUED] diclofenac (VOLTAREN) 75 MG EC tablet Take 1 tablet (75 mg total) by mouth 2 (two) times daily.   No facility-administered encounter medications on file as of 06/22/2019.    Allergies  Allergen Reactions  . Codeine Nausea Only   Patient Active Problem List   Diagnosis Date Noted  . Diabetes mellitus without complication ([place]) 68/07/7516  . GERD (gastroesophageal reflux disease) 07/14/2014  . Primary open angle glaucoma of both eyes, mild stage 05/29/2014  . Knee pain, left 11/02/2013  . IBS (  irritable bowel syndrome)- diarrhea predominant 01/26/2013  . History of colonic polyps 11/28/2011  . SVT (supraventricular tachycardia) ([place]) 08/14/2011  . Senile nuclear sclerosis 06/29/2011  . Glaucoma associated with ocular disorder 11/05/2009  . Cervical disc disorder with radiculopathy of cervical region 11/05/2009  . Hyperlipidemia 09/22/2007  . Depression, major, recurrent, in partial remission ([place]). Lexapro 46m. Xanax once a week.  03/11/2007  . Essential hypertension 03/11/2007  . Rosacea 03/11/2007   Social History    Socioeconomic History  . Marital status: Widowed    Spouse name: Not on file  . Number of children: Not on file  . Years of education: Not on file  . Highest education level: Not on file  Occupational History  . Occupation: retired    [employer] farm manager RETIRED  Social Needs  . Financial resource strain: Not on file  . Food insecurity    Worry: Not on file    Inability: Not on file  . Transportation needs    Medical: Not on file    Non-medical: Not on file  Tobacco Use  . Smoking status: Never Smoker  . Smokeless tobacco: Never Used  Substance and Sexual Activity  . Alcohol use: No    Alcohol/week: 0.0 standard drinks  . Drug use: No  . Sexual activity: Never    Birth control/protection: Post-menopausal    Comment: 1st intercourse 78 yo-Fewer than 5 partners  Lifestyle  . Physical activity    Days per week: Not on file    Minutes per session: Not on file  . Stress: Not on file  Relationships  . Social [place] phone: Not on file    Gets together: Not on file    Attends religious service: Not on file    Active member of club or organization: Not on file    Attends meetings of clubs or organizations: Not on file    Relationship status: Not on file  . Intimate partner violence    Fear of current or ex partner: Not on file    Emotionally abused: Not on file    Physically abused: Not on file    Forced sexual activity: Not on file  Other Topics Concern  . Not on file  Social History Narrative   Lives with grown son. Widowed. 5 children and 7 grandchildren.       Retired from [employer] 5 years and later took care of children.       Hobbies: time with grandkids, shopping, go out to eat. Travels once a year with girlfriends.     Ms. MDecockfamily history includes Cerebral palsy in her sister; Diabetes in her maternal grandmother; Glaucoma in her mother; Heart disease in her father; Hypertension in her father; Lung cancer in her father; Mental illness in her  mother; Pancreatic cancer in her daughter; Stroke in her paternal aunt, paternal aunt, paternal uncle, paternal uncle, paternal uncle, and sister.      Objective:    Vitals:   06/22/19 1001  BP: 106/66  Pulse: 72    Physical Exam Well-developed well-nourished elderly white female in no acute distress.  Height, Weight, 118 BMI 22.3  HEENT; nontraumatic normocephalic, EOMI, PER R LA, sclera anicteric. Oropharynx; not examined/mask/Covid Neck; supple, no JVD Cardiovascular; regular rate and rhythm with S1-S2, no murmur rub or gallop Pulmonary; Clear bilaterally Abdomen; soft, she has tenderness bilaterally in the lower quadrants, no palpable mass, nondistended, no  hepatosplenomegaly, bowel sounds are active Rectal; not done today skin;  benign exam, no jaundice rash or appreciable lesions Extremities; no clubbing cyanosis or edema skin warm and dry Neuro/Psych; alert and oriented x4, grossly nonfocal mood and affect appropriate       Assessment & Plan:   #18 78 year old white female with couple of month history of intermittent episodes of abdominal discomfort nausea and vomiting.  Worsening symptoms last week, sick all week with intermittent headache, unsteadiness, nausea vomiting and fairly generalized abdominal pain.  Improved over the past few days then recurred last night with a bad episode of generalized cramping and pain.  Patient has bilateral lower abdominal tenderness on exam, I do not think she has diverticulitis at present but cannot rule out.  Symptoms fairly severe for typical IBS.  Rule out intra-abdominal inflammatory process or neoplasm.  #2 history of adenomatous colon polyps-last colonoscopy 2013-indicated for 5-year interval follow-up 3.  GERD 4.  Hypertension 5.  Adult onset diabetes mellitus 6.  Diverticulosis 7.  History of BPV  Plan; CBC with differential, c-Met, sed rate, Schedule for CT scan of the abdomen and pelvis with contrast. Further plans pending  results of above. Patient is now overdue for follow-up colonoscopy, that is now 77 years old.  Would not do colonoscopy for surveillance but if CT scan unrevealing and continuing to have lower abdominal pain may need to be scheduled for colonoscopy with Dr. Carlean Purl.   Genia Harold PA-C 06/22/2019   Cc: [name], MD

## 2019-06-22 NOTE — Patient Instructions (Signed)
Your provider has requested that you go to the basement level for lab work before leaving today. Press "B" on the elevator. The lab is located at the first door on the left as you exit the elevator.  You have been scheduled for a CT scan of the abdomen and pelvis at [facility] (1st floor).   You are scheduled on 06-24-2019 at 12:00 pm. You should arrive 15 minutes prior to your appointment time for registration. Please follow the written instructions below on the day of your exam:  WARNING: IF YOU ARE ALLERGIC TO IODINE/X-RAY DYE, PLEASE NOTIFY RADIOLOGY IMMEDIATELY AT [phone]! YOU WILL BE GIVEN A 13 HOUR PREMEDICATION PREP.  1) Do not eat or drink anything after 8:00 am (4 hours prior to your test) 2) You have been given 2 bottles of oral contrast to drink. The solution may taste better if refrigerated, but do NOT add ice or any other liquid to this solution. Shake well before drinking.    Drink 1 bottle of contrast @ 10:00 am (2 hours prior to your exam)  Drink 1 bottle of contrast @ 11:00 am (1 hour prior to your exam)  You may take any medications as prescribed with a small amount of water, if necessary. If you take any of the following medications: METFORMIN, GLUCOPHAGE, GLUCOVANCE, AVANDAMET, RIOMET, FORTAMET, [place] MET, JANUMET, GLUMETZA or METAGLIP, you MAY be asked to HOLD this medication 48 hours AFTER the exam.  The purpose of you drinking the oral contrast is to aid in the visualization of your intestinal tract. The contrast solution may cause some diarrhea. Depending on your individual set of symptoms, you may also receive an intravenous injection of x-ray contrast/dye. Plan on being at [facility] for 30 minutes or longer, depending on the type of exam you are having performed.  This test typically takes 30-45 minutes to complete.  If you have any questions regarding your exam or if you need to reschedule, you may call the CT department at [phone]  between the hours of 8:00 am and 5:00 pm, Monday-Friday.  ________________________________________________________________________

## 2019-06-24 ENCOUNTER — Ambulatory Visit (HOSPITAL_COMMUNITY)
Admission: RE | Admit: 2019-06-24 | Discharge: 2019-06-24 | Disposition: A | Discharge status: Home / Self Care | Payer: Medicare Other | Source: Ambulatory Visit | Attending: Physician Assistant | Admitting: Physician Assistant

## 2019-06-24 ENCOUNTER — Other Ambulatory Visit: Payer: Self-pay

## 2019-06-24 DIAGNOSIS — I700 Atherosclerosis of aorta: Secondary | ICD-10-CM | POA: Diagnosis not present

## 2019-06-24 DIAGNOSIS — N289 Disorder of kidney and ureter, unspecified: Secondary | ICD-10-CM | POA: Insufficient documentation

## 2019-06-24 DIAGNOSIS — N200 Calculus of kidney: Secondary | ICD-10-CM | POA: Insufficient documentation

## 2019-06-24 DIAGNOSIS — R112 Nausea with vomiting, unspecified: Secondary | ICD-10-CM

## 2019-06-24 DIAGNOSIS — K5732 Diverticulitis of large intestine without perforation or abscess without bleeding: Secondary | ICD-10-CM | POA: Diagnosis not present

## 2019-06-24 DIAGNOSIS — M47816 Spondylosis without myelopathy or radiculopathy, lumbar region: Secondary | ICD-10-CM | POA: Insufficient documentation

## 2019-06-24 DIAGNOSIS — K449 Diaphragmatic hernia without obstruction or gangrene: Secondary | ICD-10-CM | POA: Diagnosis not present

## 2019-06-24 DIAGNOSIS — M5136 Other intervertebral disc degeneration, lumbar region: Secondary | ICD-10-CM | POA: Insufficient documentation

## 2019-06-24 MED ORDER — IOHEXOL 300 MG/ML  SOLN
75.0000 mL | Freq: Once | INTRAMUSCULAR | Status: AC | PRN
  Administered 2019-06-24: 75 mL via INTRAVENOUS

## 2019-06-24 MED ORDER — SODIUM CHLORIDE (PF) 0.9 % IJ SOLN
INTRAMUSCULAR | Status: AC
  Filled 2019-06-24: qty 50

## 2019-06-29 ENCOUNTER — Telehealth: Payer: Self-pay | Admitting: Physician Assistant

## 2019-06-29 NOTE — Telephone Encounter (Signed)
Please see the imaging results. The patient had declined the antibiotics recommended based on the imaging that indicates a mild diverticulitis because she is feeling better. She is now concerned this was not a good decision stating "what if I am wrong and I do need them?:" Further questioning she does admit to a "heavy feeling in my lower abdomen." Denies discomfort, pulling sensation, feeling of constipation. She wants to be certain she is making a good decision. Please advise.

## 2019-07-05 NOTE — Telephone Encounter (Signed)
Spoke with the patient. She states she is "fine" and "feeling good."  She states she is more comfortable with her decision not to take the antibiotics.

## 2019-07-05 NOTE — Telephone Encounter (Signed)
Please call her back and see how she has been over the past few days - - if better  Then no Antibiotic,

## 2019-07-21 ENCOUNTER — Ambulatory Visit (INDEPENDENT_AMBULATORY_CARE_PROVIDER_SITE_OTHER): Payer: Medicare Other | Admitting: Family Medicine

## 2019-07-21 ENCOUNTER — Ambulatory Visit (INDEPENDENT_AMBULATORY_CARE_PROVIDER_SITE_OTHER): Payer: Medicare Other

## 2019-07-21 ENCOUNTER — Encounter: Payer: Self-pay | Admitting: Family Medicine

## 2019-07-21 VITALS — BP 134/72 | HR 96 | Temp 98.1°F | Ht 61.0 in | Wt 120.6 lb

## 2019-07-21 VITALS — BP 134/72 | Temp 98.1°F | Ht 61.0 in | Wt 120.6 lb

## 2019-07-21 DIAGNOSIS — Z0000 Encounter for general adult medical examination without abnormal findings: Secondary | ICD-10-CM | POA: Diagnosis not present

## 2019-07-21 DIAGNOSIS — Z79899 Other long term (current) drug therapy: Secondary | ICD-10-CM

## 2019-07-21 DIAGNOSIS — E1169 Type 2 diabetes mellitus with other specified complication: Secondary | ICD-10-CM | POA: Diagnosis not present

## 2019-07-21 DIAGNOSIS — I700 Atherosclerosis of aorta: Secondary | ICD-10-CM | POA: Insufficient documentation

## 2019-07-21 DIAGNOSIS — M25531 Pain in right wrist: Secondary | ICD-10-CM

## 2019-07-21 DIAGNOSIS — E785 Hyperlipidemia, unspecified: Secondary | ICD-10-CM

## 2019-07-21 DIAGNOSIS — E119 Type 2 diabetes mellitus without complications: Secondary | ICD-10-CM | POA: Diagnosis not present

## 2019-07-21 DIAGNOSIS — I471 Supraventricular tachycardia: Secondary | ICD-10-CM

## 2019-07-21 LAB — HEMOGLOBIN A1C: Hgb A1c MFr Bld: 6.5 % (ref 4.6–6.5)

## 2019-07-21 LAB — COMPREHENSIVE METABOLIC PANEL
ALT: 11 U/L (ref 0–35)
AST: 11 U/L (ref 0–37)
Albumin: 4.1 g/dL (ref 3.5–5.2)
Alkaline Phosphatase: 53 U/L (ref 39–117)
BUN: 21 mg/dL (ref 6–23)
CO2: 28 mEq/L (ref 19–32)
Calcium: 9.4 mg/dL (ref 8.4–10.5)
Chloride: 98 mEq/L (ref 96–112)
Creatinine, Ser: 0.81 mg/dL (ref 0.40–1.20)
GFR: 67.94 mL/min (ref >60.00)
Glucose, Bld: 109 mg/dL — ABNORMAL HIGH (ref 70–99)
Potassium: 3.9 mEq/L (ref 3.5–5.1)
Sodium: 136 mEq/L (ref 135–145)
Total Bilirubin: 0.6 mg/dL (ref 0.2–1.2)
Total Protein: 6.6 g/dL (ref 6.0–8.3)

## 2019-07-21 LAB — CBC WITH DIFFERENTIAL/PLATELET
Basophils Absolute: 0 10*3/uL (ref 0.0–0.1)
Basophils Relative: 0.5 % (ref 0.0–3.0)
Eosinophils Absolute: 0.1 10*3/uL (ref 0.0–0.7)
Eosinophils Relative: 0.8 % (ref 0.0–5.0)
HCT: 39.9 % (ref 36.0–46.0)
Hemoglobin: 13.1 g/dL (ref 12.0–15.0)
Lymphocytes Relative: 39.3 % (ref 12.0–46.0)
Lymphs Abs: 3 10*3/uL (ref 0.7–4.0)
MCHC: 32.9 g/dL (ref 30.0–36.0)
MCV: 94.7 fl (ref 78.0–100.0)
Monocytes Absolute: 0.4 10*3/uL (ref 0.1–1.0)
Monocytes Relative: 5.7 % (ref 3.0–12.0)
Neutro Abs: 4.1 10*3/uL (ref 1.4–7.7)
Neutrophils Relative %: 53.7 % (ref 43.0–77.0)
Platelets: 340 10*3/uL (ref 150.0–400.0)
RBC: 4.21 Mil/uL (ref 3.87–5.11)
RDW: 12.4 % (ref 11.5–15.5)
WBC: 7.7 10*3/uL (ref 4.0–10.5)

## 2019-07-21 LAB — VITAMIN B12: Vitamin B-12: 827 pg/mL (ref 211–911)

## 2019-07-21 LAB — URIC ACID: Uric Acid, Serum: 6.8 mg/dL (ref 2.4–7.0)

## 2019-07-21 MED ORDER — ALPRAZOLAM 0.25 MG PO TABS: 0.2500 mg | ORAL_TABLET | Freq: Every day | ORAL | 0 refills | Status: DC | PRN

## 2019-07-21 MED ORDER — ATORVASTATIN CALCIUM 10 MG PO TABS: 10.0000 mg | ORAL_TABLET | Freq: Every day | ORAL | 5 refills | Status: DC

## 2019-07-21 NOTE — Assessment & Plan Note (Signed)
S:noted on CT scan in November 2020 A/P: discussed risk factor modification

## 2019-07-21 NOTE — Progress Notes (Signed)
Subjective:   [name] is a 78 y.o. female who presents for Medicare Annual (Subsequent) preventive examination.  Review of Systems:   Cardiac Risk Factors include: advanced age (>40men, >5 women);hypertension    Objective:     Vitals: BP 134/72 (BP Location: Left Arm, Patient Position: Sitting, Cuff Size: Normal)   Temp 98.1 F (36.7 C) (Temporal)   Ht 5\' 1"  (1.549 m)   Wt 120 lb 9.6 oz (54.7 kg)   LMP 08/12/1991   SpO2 95%   BMI 22.79 kg/m   Body mass index is 22.79 kg/m.  Advanced Directives 07/21/2019 07/25/2016 10/05/2015 10/04/2015 02/06/2015 08/14/2011 08/07/2011  Does Patient Have a Medical Advance Directive? Yes Yes Yes No No Patient has advance directive, copy in chart Patient has advance directive, copy not in chart  Type of Advance Directive Living will;Healthcare Power of [place];Living will Living will;Healthcare Power of [place] will [place];Living will  Does patient want to make changes to medical advance directive? No - Patient declined - No - Patient declined - - - -  Copy of [place] in Chart? No - copy requested No - copy requested No - copy requested - - - Copy requested from other (Comment)  Would patient like information on creating a medical advance directive? - - - - No - patient declined information - -  Pre-existing out of facility DNR order (yellow form or pink MOST form) - - - - - - No    Tobacco Social History   Tobacco Use  Smoking Status Never Smoker  Smokeless Tobacco Never Used     Counseling given: Not Answered   Clinical Intake:  Pre-visit preparation completed: Yes  Pain : No/denies pain  Diabetes: Yes CBG done?: No Did pt. bring in CBG monitor from home?: No  How often do you need to have someone help you when you read instructions, pamphlets, or other written materials from your doctor or pharmacy?: 1 - Never  Interpreter Needed?: No   Information entered by :: Denman George LPN  Past Medical History:  Diagnosis Date  . Adenomatous colon polyp 2006  . Anxiety   . Arthritis   . Benign esophageal stricture 10/14/2012  . CERUMEN IMPACTION, BILATERAL 12/13/2007  . CERVICAL RADICULOPATHY 11/05/2009  . Chronic kidney disease    kidney stones  . DEPRESSION 03/11/2007  . DIVERTICULOSIS, COLON 03/11/2007  . Esophagitis, reflux 10/14/2012  . GERD (gastroesophageal reflux disease) 10/14/2012  . Glaucoma   . 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 Hearing loss   . HEMORRHOIDS, EXTERNAL W/O COMPLICATION [id number]   Qualifier: Diagnosis of  By: Jimmye Norman, LPN, [place]   . HYPERLIPIDEMIA 09/22/2007  . HYPERTENSION 03/11/2007  . Kidney stone   . Osteopenia 02/2015   T score -1.4 FRAX 7.3%/0.6%  . Rectocele   . Rosacea 03/11/2007  . Tachycardia    Past Surgical History:  Procedure Laterality Date  . ANTERIOR CERVICAL DECOMP/DISCECTOMY FUSION  08/14/2011   Procedure: ANTERIOR CERVICAL DECOMPRESSION/DISCECTOMY FUSION 1 LEVEL/HARDWARE REMOVAL;  Surgeon: [name], MD;  Location: [facility];  Service: Neurosurgery;  Laterality: N/A;  Cervical Six-Seven anterior cervical decompression with fusion interbody prothesis plating and bonegraft with removal of hardware of Cervical Four to Cervical Six   . CERVICAL FUSION     C4-6  . COLONOSCOPY     multiple  . ESOPHAGOGASTRODUODENOSCOPY  10/14/2012   Dr. [name]  .  KIDNEY SURGERY     age 27  . TONSILLECTOMY     Family History  Problem Relation Age of Onset  . Mental illness Mother        comitted suicide  . Glaucoma Mother   . Lung cancer Father        smoker  . Hypertension Father   . Heart disease Father        age 43, smoker  . Cerebral palsy Sister   . Stroke Sister        multiple  . Diabetes Maternal Grandmother   . Stroke Paternal Aunt   . Stroke Paternal Uncle   . Stroke Paternal Uncle   . Stroke Paternal Uncle   . Stroke Paternal Aunt   .  Pancreatic cancer Daughter   . Colon cancer Neg Hx   . Stomach cancer Neg Hx    Social History   Socioeconomic History  . Marital status: Widowed    Spouse name: Not on file  . Number of children: Not on file  . Years of education: Not on file  . Highest education level: Not on file  Occupational History  . Occupation: retired    Fish farm manager: RETIRED  Tobacco Use  . Smoking status: Never Smoker  . Smokeless tobacco: Never Used  Substance and Sexual Activity  . Alcohol use: No    Alcohol/week: 0.0 standard drinks  . Drug use: No  . Sexual activity: Never    Birth control/protection: Post-menopausal    Comment: 1st intercourse 78 yo-Fewer than 5 partners  Other Topics Concern  . Not on file  Social History Narrative   Lives with grown son. Widowed. 5 children and 7 grandchildren.       Retired from [employer] after 5 years and later took care of children.       Hobbies: time with grandkids, shopping, go out to eat. Travels once a year with girlfriends.    Social Determinants of Health   Financial Resource Strain:   . Difficulty of Paying Living Expenses: Not on file  Food Insecurity:   . Worried About Charity fundraiser in the Last Year: Not on file  . Ran Out of Food in the Last Year: Not on file  Transportation Needs:   . Lack of Transportation (Medical): Not on file  . Lack of Transportation (Non-Medical): Not on file  Physical Activity:   . Days of Exercise per Week: Not on file  . Minutes of Exercise per Session: Not on file  Stress:   . Feeling of Stress : Not on file  Social Connections:   . Frequency of Communication with Friends and Family: Not on file  . Frequency of Social Gatherings with Friends and Family: Not on file  . Attends Religious Services: Not on file  . Active Member of Clubs or Organizations: Not on file  . Attends Archivist Meetings: Not on file  . Marital Status: Not on file    Outpatient Encounter Medications as of 07/21/2019   Medication Sig  . acetaminophen (TYLENOL) 325 MG tablet Take 650 mg by mouth every 6 (six) hours as needed for moderate pain.  [name] ALPRAZolam (XANAX) 0.25 MG tablet Take 1 tablet (0.25 mg total) by mouth daily as needed.  . Calcium Carbonate-Vitamin D (CALCIUM + D PO) Take 1 tablet by mouth daily.    [name] diltiazem (CARDIZEM CD) 240 MG 24 hr capsule TAKE 1 CAPSULE BY MOUTH EVERY DAY  . dorzolamide-timolol (COSOPT) 22.3-6.8 MG/ML  ophthalmic solution Place 1 drop into both eyes 2 (two) times daily.  [name] escitalopram (LEXAPRO) 20 MG tablet TAKE 1 TABLET BY MOUTH EVERY DAY  . hydrochlorothiazide (HYDRODIURIL) 25 MG tablet Take 1 tablet (25 mg total) by mouth daily.  . meclizine (ANTIVERT) 25 MG tablet Take 1 tablet (25 mg total) by mouth 3 (three) times daily as needed for dizziness or nausea (vertigo).  . pantoprazole (PROTONIX) 40 MG tablet TAKE 1 TABLET BY MOUTH EVERY DAY BEFORE BREAKFAST  . PEPPERMINT OIL PO Take 2 capsules by mouth 3 (three) times daily as needed. IB Gard  . valsartan (DIOVAN) 320 MG tablet Take 1 tablet (320 mg total) by mouth daily.   No facility-administered encounter medications on file as of 07/21/2019.    Activities of Daily Living In your present state of health, do you have any difficulty performing the following activities: 07/21/2019  Hearing? Y  Comment bilateral hearing aids  Vision? N  Difficulty concentrating or making decisions? N  Walking or climbing stairs? N  Dressing or bathing? N  Doing errands, shopping? N  Preparing Food and eating ? N  Using the Toilet? N  In the past six months, have you accidently leaked urine? N  Do you have problems with loss of bowel control? N  Managing your Medications? N  Managing your Finances? N  Housekeeping or managing your Housekeeping? N  Some recent data might be hidden    Patient Care Team: [name], MD as PCP - General (Family Medicine) Drake Leach, [place] as Consulting Physician (Optometry) Gatha Mayer, MD as Consulting Physician (Gastroenterology)    Assessment:   This is a routine wellness examination for [place].  Exercise Activities and Dietary recommendations Current Exercise Habits: Home exercise routine, Type of exercise: walking, Time (Minutes): 30, Frequency (Times/Week): 2, Weekly Exercise (Minutes/Week): 60, Intensity: Mild  Goals    . Exercise 150 minutes per week (moderate activity)     Generally will try to walk briskly 5 days a week        Fall Risk Fall Risk  07/21/2019 03/23/2019 03/11/2019 08/24/2017 07/25/2016  Falls in the past year? 0 0 (No Data) No No  Comment - - Emmi Telephone Survey: data to providers prior to load - -  Number falls in past yr: - 0 (No Data) - -  Comment - - Emmi Telephone Survey Actual Response =  - -  Injury with Fall? 0 0 - - -  Follow up Falls evaluation completed;Education provided;Falls prevention discussed - - - -   Is the patient's home free of loose throw rugs in walkways, pet beds, electrical cords, etc?   yes      Grab bars in the bathroom? yes      Handrails on the stairs?   yes      Adequate lighting?   yes  Timed Get Up and Go performed: completed and within normal timeframe; no gait abnormalities noted   Depression Screen PHQ 2/9 Scores 07/21/2019 03/23/2019 03/23/2019 08/16/2018  PHQ - 2 Score 1 2 4 2   PHQ- 9 Score - 6 - -     Cognitive Function- no cognitive concerns at this time  MMSE - Mini Mental State Exam 07/25/2016  Not completed: (No Data)     6CIT Screen 07/21/2019  What Year? 0 points  What month? 0 points  What time? 0 points  Count back from 20 0 points  Months in reverse 0 points  Repeat phrase 2 points  Total Score 2    Immunization History  Administered Date(s) Administered  . Influenza Split 06/25/2011, 06/11/2012  . Influenza Whole 06/29/2009, 06/27/2010  . Influenza, High Dose Seasonal PF 06/10/2013, 06/02/2014, 06/09/2019  . Influenza,inj,Quad PF,6+ Mos 06/09/2019  .  Influenza-Unspecified 05/18/2015, 05/15/2016, 05/28/2017, 05/26/2018  . Pneumococcal Conjugate-13 07/14/2014  . Pneumococcal Polysaccharide-23 09/11/2005  . Td 11/10/1998, 10/20/2008  . Tdap 02/06/2015  . Zoster 03/22/2008    Qualifies for Shingles Vaccine?Discussed and patient will check with pharmacy for coverage.  Patient education handout provided    Screening Tests Health Maintenance  Topic Date Due  . FOOT EXAM  12/30/1950  . OPHTHALMOLOGY EXAM  12/30/1950  . HEMOGLOBIN A1C  09/17/2019  . TETANUS/TDAP  02/05/2025  . INFLUENZA VACCINE  Completed  . DEXA SCAN  Completed  . PNA vac Low Risk Adult  Completed    Cancer Screenings: Lung: Low Dose CT Chest recommended if Age 25-80 years, 30 pack-year currently smoking OR have quit w/in 15years. Patient does not qualify. Breast:  Up to date on Mammogram? Yes   Up to date of Bone Density/Dexa? Yes Colorectal: colonoscopy 11/28/11; no longer indicated      Plan:  I have personally reviewed and addressed the Medicare Annual Wellness questionnaire and have noted the following in the patient's chart:  A. Medical and social history B. Use of alcohol, tobacco or illicit drugs  C. Current medications and supplements D. Functional ability and status E.  Nutritional status F.  Physical activity G. Advance directives H. List of other physicians I.  Hospitalizations, surgeries, and ER visits in previous 12 months J.  [place] such as hearing and vision if needed, cognitive and depression L. Referrals, records requested, and appointments- none   In addition, I have reviewed and discussed with patient certain preventive protocols, quality metrics, and best practice recommendations. A written personalized care plan for preventive services as well as general preventive health recommendations were provided to patient.   Signed,  Denman George, LPN  Nurse Health Advisor   Nurse Notes: no additional

## 2019-07-21 NOTE — Patient Instructions (Addendum)
[name] , Thank you for taking time to come for your Medicare Wellness Visit. I appreciate your ongoing commitment to your health goals. Please review the following plan we discussed and let me know if I can assist you in the future.   Screening recommendations/referrals: Colorectal Screening: up to date; last colonoscopy 11/28/11 Mammogram: up to date; last 06/17/19 Bone Density: up to date; last 02/13/15  Vision and Dental Exams: Recommended annual ophthalmology exams for early detection of glaucoma and other disorders of the eye Recommended annual dental exams for proper oral hygiene  Diabetic Exams: Diabetic Eye Exam: recommended yearly; up to date Diabetic Foot Exam: recommended yearly   Vaccinations: Influenza vaccine: completed 06/09/19 Pneumococcal vaccine: up to date; last 07/14/14 Tdap vaccine: up to date; last 02/06/15  Shingles vaccine: Please call your insurance company to determine your out of pocket expense for the Shingrix vaccine. You may receive this vaccine at your local pharmacy. (see attached handout)   Advanced directives: Please bring a copy of your POA (Power of Attorney) and/or Living Will to your next appointment.  Goals: Recommend to drink at least 6-8 8oz glasses of water per day and consume a balanced diet rich in fresh fruits and vegetables.   Next appointment: Please schedule your Annual Wellness Visit with your Nurse Health Advisor in one year.  Preventive Care 1 Years and Older, Female Preventive care refers to lifestyle choices and visits with your health care provider that can promote health and wellness. What does preventive care include?  A yearly physical exam. This is also called an annual well check.  Dental exams once or twice a year.  Routine eye exams. Ask your health care provider how often you should have your eyes checked.  Personal lifestyle choices, including:  Daily care of your teeth and gums.  Regular physical activity.   Eating a healthy diet.  Avoiding tobacco and drug use.  Limiting alcohol use.  Practicing safe sex.  Taking low-dose aspirin every day if recommended by your health care provider.  Taking vitamin and mineral supplements as recommended by your health care provider. What happens during an annual well check? The services and screenings done by your health care provider during your annual well check will depend on your age, overall health, lifestyle risk factors, and family history of disease. Counseling  Your health care provider may ask you questions about your:  Alcohol use.  Tobacco use.  Drug use.  Emotional well-being.  Home and relationship well-being.  Sexual activity.  Eating habits.  History of falls.  Memory and ability to understand (cognition).  Work and work Statistician.  Reproductive health. Screening  You may have the following tests or measurements:  Height, weight, and BMI.  Blood pressure.  Lipid and cholesterol levels. These may be checked every 5 years, or more frequently if you are over 25 years old.  Skin check.  Lung cancer screening. You may have this screening every year starting at age 92 if you have a 30-pack-year history of smoking and currently smoke or have quit within the past 15 years.  Fecal occult blood test (FOBT) of the stool. You may have this test every year starting at age 79.  Flexible sigmoidoscopy or colonoscopy. You may have a sigmoidoscopy every 5 years or a colonoscopy every 10 years starting at age 52.  Hepatitis C blood test.  Hepatitis B blood test.  Sexually transmitted disease (STD) testing.  Diabetes screening. This is done by checking your blood sugar (glucose)  after you have not eaten for a while (fasting). You may have this done every 1-3 years.  Bone density scan. This is done to screen for osteoporosis. You may have this done starting at age 26.  Mammogram. This may be done every 1-2 years. Talk to  your health care provider about how often you should have regular mammograms. Talk with your health care provider about your test results, treatment options, and if necessary, the need for more tests. Vaccines  Your health care provider may recommend certain vaccines, such as:  Influenza vaccine. This is recommended every year.  Tetanus, diphtheria, and acellular pertussis (Tdap, Td) vaccine. You may need a Td booster every 10 years.  Zoster vaccine. You may need this after age 45.  Pneumococcal 13-valent conjugate (PCV13) vaccine. One dose is recommended after age 80.  Pneumococcal polysaccharide (PPSV23) vaccine. One dose is recommended after age 45. Talk to your health care provider about which screenings and vaccines you need and how often you need them. This information is not intended to replace advice given to you by your health care provider. Make sure you discuss any questions you have with your health care provider. Document Released: 08/24/2015 Document Revised: 04/16/2016 Document Reviewed: 05/29/2015 Elsevier Interactive Patient Education  2017 [employer] Prevention in the Home Falls can cause injuries. They can happen to people of all ages. There are many things you can do to make your home safe and to help prevent falls. What can I do on the outside of my home?  Regularly fix the edges of walkways and driveways and fix any cracks.  Remove anything that might make you trip as you walk through a door, such as a raised step or threshold.  Trim any bushes or trees on the path to your home.  Use bright outdoor lighting.  Clear any walking paths of anything that might make someone trip, such as rocks or tools.  Regularly check to see if handrails are loose or broken. Make sure that both sides of any steps have handrails.  Any raised decks and porches should have guardrails on the edges.  Have any leaves, snow, or ice cleared regularly.  Use sand or salt on  walking paths during winter.  Clean up any spills in your garage right away. This includes oil or grease spills. What can I do in the bathroom?  Use night lights.  Install grab bars by the toilet and in the tub and shower. Do not use towel bars as grab bars.  Use non-skid mats or decals in the tub or shower.  If you need to sit down in the shower, use a plastic, non-slip stool.  Keep the floor dry. Clean up any water that spills on the floor as soon as it happens.  Remove soap buildup in the tub or shower regularly.  Attach bath mats securely with double-sided non-slip rug tape.  Do not have throw rugs and other things on the floor that can make you trip. What can I do in the bedroom?  Use night lights.  Make sure that you have a light by your bed that is easy to reach.  Do not use any sheets or blankets that are too big for your bed. They should not hang down onto the floor.  Have a firm chair that has side arms. You can use this for support while you get dressed.  Do not have throw rugs and other things on the floor that can make you  trip. What can I do in the kitchen?  Clean up any spills right away.  Avoid walking on wet floors.  Keep items that you use a lot in easy-to-reach places.  If you need to reach something above you, use a strong step stool that has a grab bar.  Keep electrical cords out of the way.  Do not use floor polish or wax that makes floors slippery. If you must use wax, use non-skid floor wax.  Do not have throw rugs and other things on the floor that can make you trip. What can I do with my stairs?  Do not leave any items on the stairs.  Make sure that there are handrails on both sides of the stairs and use them. Fix handrails that are broken or loose. Make sure that handrails are as long as the stairways.  Check any carpeting to make sure that it is firmly attached to the stairs. Fix any carpet that is loose or worn.  Avoid having throw  rugs at the top or bottom of the stairs. If you do have throw rugs, attach them to the floor with carpet tape.  Make sure that you have a light switch at the top of the stairs and the bottom of the stairs. If you do not have them, ask someone to add them for you. What else can I do to help prevent falls?  Wear shoes that:  Do not have high heels.  Have rubber bottoms.  Are comfortable and fit you well.  Are closed at the toe. Do not wear sandals.  If you use a stepladder:  Make sure that it is fully opened. Do not climb a closed stepladder.  Make sure that both sides of the stepladder are locked into place.  Ask someone to hold it for you, if possible.  Clearly mark and make sure that you can see:  Any grab bars or handrails.  First and last steps.  Where the edge of each step is.  Use tools that help you move around (mobility aids) if they are needed. These include:  Canes.  Walkers.  Scooters.  Crutches.  Turn on the lights when you go into a dark area. Replace any light bulbs as soon as they burn out.  Set up your furniture so you have a clear path. Avoid moving your furniture around.  If any of your floors are uneven, fix them.  If there are any pets around you, be aware of where they are.  Review your medicines with your doctor. Some medicines can make you feel dizzy. This can increase your chance of falling. Ask your doctor what other things that you can do to help prevent falls. This information is not intended to replace advice given to you by your health care provider. Make sure you discuss any questions you have with your health care provider. Document Released: 05/24/2009 Document Revised: 01/03/2016 Document Reviewed: 09/01/2014 Elsevier Interactive Patient Education  2017 [employer]. 3

## 2019-07-21 NOTE — Progress Notes (Signed)
Phone [phone] In person visit   Subjective:   [name] is a 78 y.o. year old very pleasant female patient who presents for/with See problem oriented charting Chief Complaint  Patient presents with  . Follow-up  . Hypertension  . vertigo   ROS-Review of Systems  Constitutional: Negative.   HENT: Positive for hearing loss (wears hearing aids in both ears).   Eyes: Negative.   Respiratory: Negative.   Cardiovascular: Negative.   Gastrointestinal: Negative.   Genitourinary: Negative.   Musculoskeletal: Positive for neck pain (left side neck).  Skin: Negative.   Neurological: Negative.   Endo/Heme/Allergies: Negative.   Psychiatric/Behavioral: Positive for depression (taking Lexapro).   This visit occurred during the SARS-CoV-2 public health emergency.  Safety protocols were in place, including screening questions prior to the visit, additional usage of staff PPE, and extensive cleaning of exam room while observing appropriate contact time as indicated for disinfecting solutions.   Past Medical History-  Patient Active Problem List   Diagnosis Date Noted  . Diabetes mellitus without complication ([place]) XX123456    Priority: High  . SVT (supraventricular tachycardia) ([place]) 08/14/2011    Priority: High  . GERD (gastroesophageal reflux disease) 07/14/2014    Priority: Medium  . Primary open angle glaucoma of both eyes, mild stage 05/29/2014    Priority: Medium  . IBS (irritable bowel syndrome)- diarrhea predominant 01/26/2013    Priority: Medium  . Glaucoma associated with ocular disorder 11/05/2009    Priority: Medium  . Hyperlipidemia associated with type 2 diabetes mellitus ([place]) 09/22/2007    Priority: Medium  . Major depression in full remission ([place])- Lexapro 20mg . Xanax once a week or less. 03/11/2007    Priority: Medium  . Hypertension associated with diabetes ([place]) 03/11/2007    Priority: Medium  . History of colonic polyps 11/28/2011    Priority: Low   . Cervical disc disorder with radiculopathy of cervical region 11/05/2009    Priority: Low  . Rosacea 03/11/2007    Priority: Low  . Aortic atherosclerosis ([place]) 07/21/2019  . Knee pain, left 11/02/2013  . Senile nuclear sclerosis 06/29/2011    Medications- reviewed and updated Current Outpatient Medications  Medication Sig Dispense Refill  . acetaminophen (TYLENOL) 325 MG tablet Take 650 mg by mouth every 6 (six) hours as needed for moderate pain.    [name] ALPRAZolam (XANAX) 0.25 MG tablet Take 1 tablet (0.25 mg total) by mouth daily as needed for anxiety. 30 tablet 0  . atorvastatin (LIPITOR) 10 MG tablet Take 1 tablet (10 mg total) by mouth daily. 4 tablet 5  . Calcium Carbonate-Vitamin D (CALCIUM + D PO) Take 1 tablet by mouth daily.      [name] diltiazem (CARDIZEM CD) 240 MG 24 hr capsule TAKE 1 CAPSULE BY MOUTH EVERY DAY 90 capsule 3  . dorzolamide-timolol (COSOPT) 22.3-6.8 MG/ML ophthalmic solution Place 1 drop into both eyes 2 (two) times daily.    [name] escitalopram (LEXAPRO) 20 MG tablet TAKE 1 TABLET BY MOUTH EVERY DAY 90 tablet 1  . hydrochlorothiazide (HYDRODIURIL) 25 MG tablet Take 1 tablet (25 mg total) by mouth daily. 90 tablet 3  . meclizine (ANTIVERT) 25 MG tablet Take 1 tablet (25 mg total) by mouth 3 (three) times daily as needed for dizziness or nausea (vertigo). 30 tablet 0  . pantoprazole (PROTONIX) 40 MG tablet TAKE 1 TABLET BY MOUTH EVERY DAY BEFORE BREAKFAST 90 tablet 0  . PEPPERMINT OIL PO Take 2 capsules by mouth 3 (three) times daily  as needed. IB Gard    . valsartan (DIOVAN) 320 MG tablet Take 1 tablet (320 mg total) by mouth daily. 90 tablet 3   No current facility-administered medications for this visit.     Objective:  BP 134/72 (BP Location: Left Arm, Patient Position: Sitting, Cuff Size: Normal)   Pulse 96   Temp 98.1 F (36.7 C) (Temporal)   Ht 5\' 1"  (1.549 m)   Wt 120 lb 9.6 oz (54.7 kg)   LMP 08/12/1991   SpO2 95%   BMI 22.79 kg/m  Gen: NAD, resting  comfortably CV: RRR no murmurs rubs or gallops Lungs: CTAB no crackles, wheeze, rhonchi Abdomen: soft/nontender/nondistended/normal bowel sounds. Ext: no edema Skin: warm, dry   Diabetic Foot Exam - Simple   Simple Foot Form Diabetic Foot exam was performed with the following findings: Yes 07/21/2019 11:05 AM  Visual Inspection No deformities, no ulcerations, no other skin breakdown bilaterally: Yes Sensation Testing Intact to touch and monofilament testing bilaterally: Yes Pulse Check Posterior Tibialis and Dorsalis pulse intact bilaterally: Yes Comments        Assessment and Plan   # Diabetes S: Diet controlled.  New diagnosis in 2020 after 2 out of 3 last A1c is over 6.5. - not exercising quite as much.  Eating reasonably healthy diet.   A/P: hopefully controlled- update a1c today  #hyperlipidemia associated with diabetes S: We discussed newer stricter goals for LDL for patients with diabetes.   A/P: With that being said given she is already age 74-we discussed potential benefit of primary prevention is unclear compared to potential risk.  Offered to prescribe once weekly statin which I believe would have low risk- we opted to try atorvastatin 10mg  once a week in light of aortic atherosclerosis and diabetes  Lab Results  Component Value Date   CHOL 232 (H) 08/16/2018   HDL 36.60 (L) 08/16/2018   LDLCALC 82 10/10/2013   LDLDIRECT 135.0 08/16/2018   TRIG 247.0 (H) 08/16/2018   CHOLHDL 6 08/16/2018   #hypertension/history of SVT. S: compliant with Cardizem 240mg , HCTZ 25mg  and Diovan 320mg   No obvious recurrence of SVT on Cardizem. A/P: stable x2- continue current meds  #Depression/anxiety- lost son to MI 2019 and daughter to pancreatic cancer 2020 S: Patient compliant with Lexapro 20 mg.  PHQ-9 controlled under 5 today.  Patient takes sparing alprazolam A/P: reasonable control- still going through grieving process. Continue meds- refill lexapro   #GERD- with small  hiatal hernia on CT S: Patient is compliant with Protonix 40 mg prescribed by Dr. Carlean Purl.  Patient reports good control of symptoms A/P: reasonable control- update b12 with labs today given high risk meds.     #Vertigo-due to BPPV:No episodes since last visit. Patient is not taking any medications for vertigo.  #Left-sided neck pain- history of surgery on neck- worse with cold weather   #Wrist pain-back in January.  We discussed at that time getting a uric acid level.  We will check today with labs treated last month. I think likelihood of gout is really low without recurrent issues but we will check in case has future issues.   # Aortic Atherosclerosis S:noted on CT scan in November 2020 A/P: discussed risk factor modification   #diverticulitis calmed on its own without antibiotics- she still feels like bowels more irregular- if eats out has a "blow out"  - on CT also noted small hiatal hernia, right kidney stone,   Recommended follow up: Return in about 6 months (around 01/19/2020)  for follow up as long as A1c under 7- or sooner if needed.  Lab/Order associations:   ICD-10-CM   1. Diabetes mellitus without complication (H[facility])  [id number] CBC with Differential/Platelet    Comprehensive metabolic panel    Hemoglobin A1c  2. High risk medication use  Z79.899 Vitamin B12  3. Hyperlipidemia associated with type 2 diabetes mellitus ([place])  E11.69    E78.5   4. SVT (supraventricular tachycardia) (H[facility])  I47.1   5. Right wrist pain  M25.531 Uric Acid  6. Aortic atherosclerosis ([facility])  I70.0     Meds ordered this encounter  Medications  . atorvastatin (LIPITOR) 10 MG tablet    Sig: Take 1 tablet (10 mg total) by mouth daily.    Dispense:  4 tablet    Refill:  5  . ALPRAZolam (XANAX) 0.25 MG tablet    Sig: Take 1 tablet (0.25 mg total) by mouth daily as needed for anxiety.    Dispense:  30 tablet    Refill:  0    Return precautions advised.  Garret Reddish, MD

## 2019-07-21 NOTE — Patient Instructions (Addendum)
Health Maintenance Due  Topic Date Due  . OPHTHALMOLOGY EXAM patient has an appointment this month. Make sure they know you have diabetes and send a copy of report to [place] 12/30/1950   You may find this book helpful for your diabetes. It is available on [place] and is fairly inexpensive: Dr. Janene Harvey Program for Reversing Diabetes: The Scientifically Proven System for Reversing Diabetes without Drugs Http://www.nealbarnard.org/index.cfm  We also verbally discussed adding atorvastatin 10 mg once a week  Please stop by lab before you go If you do not have mychart- we will call you about results within 5 business days of [place] receiving them.  If you have mychart- we will send your results within 3 business days of [place] receiving them.  If abnormal or we want to clarify a result, we will call or mychart you to make sure you receive the message.  If you have questions or concerns or don't hear within 5-7 days, please send [place] a message or call us.

## 2019-07-22 ENCOUNTER — Other Ambulatory Visit: Payer: Self-pay | Admitting: Family Medicine

## 2019-09-02 ENCOUNTER — Other Ambulatory Visit: Payer: Self-pay | Admitting: Internal Medicine

## 2019-09-06 ENCOUNTER — Ambulatory Visit: Payer: Medicare Other

## 2019-09-15 ENCOUNTER — Ambulatory Visit: Payer: Medicare Other | Attending: Internal Medicine

## 2019-09-15 DIAGNOSIS — Z23 Encounter for immunization: Secondary | ICD-10-CM | POA: Insufficient documentation

## 2019-09-15 NOTE — Progress Notes (Signed)
   Covid-19 Vaccination Clinic  Name:  [name]    MRN: [id number] DOB: [birth year]  09/15/2019  [name] was observed post Covid-19 immunization for 15 minutes without incidence. She was provided with Vaccine Information Sheet and instruction to access the V-Safe system.   [name] was instructed to call 911 with any severe reactions post vaccine: [name] Difficulty breathing  . Swelling of your face and throat  . A fast heartbeat  . A bad rash all over your body  . Dizziness and weakness    Immunizations Administered    Name Date Dose VIS Date Route   Pfizer COVID-19 Vaccine 09/15/2019 11:33 AM 0.3 mL 07/22/2019 Intramuscular   Manufacturer: [place]   Lot: [id number]   [place]: [id number]

## 2019-09-23 ENCOUNTER — Ambulatory Visit: Payer: Medicare Other

## 2019-10-10 ENCOUNTER — Ambulatory Visit: Payer: Medicare Other | Attending: Internal Medicine

## 2019-10-10 DIAGNOSIS — Z23 Encounter for immunization: Secondary | ICD-10-CM

## 2019-10-10 NOTE — Progress Notes (Signed)
   Covid-19 Vaccination Clinic  Name:  [name]    MRN: [id number] DOB: 1940-11-08  10/10/2019  [name] was observed post Covid-19 immunization for 15 minutes without incidence. She was provided with Vaccine Information Sheet and instruction to access the V-Safe system.   Ms. Nipple was instructed to call 911 with any severe reactions post vaccine: [name] Difficulty breathing  . Swelling of your face and throat  . A fast heartbeat  . A bad rash all over your body  . Dizziness and weakness    Immunizations Administered    Name Date Dose VIS Date Route   Pfizer COVID-19 Vaccine 10/10/2019  2:55 PM 0.3 mL 07/22/2019 Intramuscular   Manufacturer: [place]   Lot: [id number]   [place]: [id number]

## 2019-12-01 ENCOUNTER — Other Ambulatory Visit: Payer: Self-pay | Admitting: Internal Medicine

## 2019-12-22 ENCOUNTER — Ambulatory Visit (INDEPENDENT_AMBULATORY_CARE_PROVIDER_SITE_OTHER): Payer: Medicare Other | Admitting: Internal Medicine

## 2019-12-22 ENCOUNTER — Encounter: Payer: Self-pay | Admitting: Internal Medicine

## 2019-12-22 VITALS — BP 112/68 | HR 64 | Temp 97.8°F | Ht 61.0 in | Wt 122.8 lb

## 2019-12-22 DIAGNOSIS — F4321 Adjustment disorder with depressed mood: Secondary | ICD-10-CM

## 2019-12-22 DIAGNOSIS — K580 Irritable bowel syndrome with diarrhea: Secondary | ICD-10-CM | POA: Diagnosis not present

## 2019-12-22 NOTE — Patient Instructions (Signed)
I think the diarrhea is from Irritable Bowel Syndrome.  Let's see how things go and if it worsens let me know. Keep using IB Donald Prose - consider taking at night regularly.  I appreciate the opportunity to care for you. Gatha Mayer, MD, [facility]

## 2019-12-22 NOTE — Progress Notes (Signed)
[name] 79 y.o. Feb 12, 1941 [id number]  Assessment & Plan:   Encounter Diagnoses  Name Primary?  . Irritable bowel syndrome with diarrhea Yes  . Grief reaction     She will monitor things.  She will continue IBgard which helps.  If it worsens we may consider additional work-up or empiric treatment otherwise.  Hopefully as there is more time between the death of her son this year this will get better.  I appreciate the opportunity to care for this patient. CC: [name], MD     Subjective:   Chief Complaint: History of dysphagia, IBS  HPI [name] is here at the urging of her children because of increased diarrhea.  She has a history of IBS and esophageal stricture.  She is not having any dysphagia at this time but she is having an uptake and diarrhea episodes about 10 a month or so.  Unfortunately over the past 3 years each year she has lost one of her children, 2 sons 2 heart attacks and a daughter died of pancreatic cancer in 12-02-2018.  She was treated for diverticulitis successfully in the fall 2020, CT showed that.  She is not having any that type of pain just some episodic diarrhea with normal bowel movements in between.  Her labs in December 2020 were okay.  Weight is stable.  She is depressed at times she says when she is busier and has something to do her mind is less on the loss of her kids.  She is sleeping okay rarely she will take a half a 0.25 mg Xanax as needed if she cannot sleep.  Again she said she came in because her children urged her to.  She notes that IBgard is very helpful when taken. Wt Readings from Last 3 Encounters:  12/22/19 122 lb 12.8 oz (55.7 kg)  07/21/19 120 lb 9.6 oz (54.7 kg)  07/21/19 120 lb 9.6 oz (54.7 kg)     Allergies  Allergen Reactions  . Codeine Nausea Only   Current Meds  Medication Sig  . acetaminophen (TYLENOL) 325 MG tablet Take 650 mg by mouth every 6 (six) hours as needed for moderate pain.  [name] ALPRAZolam (XANAX)  0.25 MG tablet Take 1 tablet (0.25 mg total) by mouth daily as needed for anxiety.  [name] atorvastatin (LIPITOR) 10 MG tablet Take 1 tablet (10 mg total) by mouth daily.  . Calcium Carbonate-Vitamin D (CALCIUM + D PO) Take 1 tablet by mouth daily.    [name] diltiazem (CARDIZEM CD) 240 MG 24 hr capsule TAKE 1 CAPSULE BY MOUTH EVERY DAY  . dorzolamide-timolol (COSOPT) 22.3-6.8 MG/ML ophthalmic solution Place 1 drop into both eyes 2 (two) times daily.  [name] escitalopram (LEXAPRO) 20 MG tablet TAKE 1 TABLET BY MOUTH EVERY DAY  . hydrochlorothiazide (HYDRODIURIL) 25 MG tablet Take 1 tablet (25 mg total) by mouth daily.  . meclizine (ANTIVERT) 25 MG tablet Take 1 tablet (25 mg total) by mouth 3 (three) times daily as needed for dizziness or nausea (vertigo).  . pantoprazole (PROTONIX) 40 MG tablet TAKE 1 TABLET BY MOUTH EVERY DAY BEFORE BREAKFAST  . PEPPERMINT OIL PO Take 2 capsules by mouth 3 (three) times daily as needed. IB Gard  . valsartan (DIOVAN) 320 MG tablet Take 1 tablet (320 mg total) by mouth daily.   Past Medical History:  Diagnosis Date  . Adenomatous colon polyp Dec 01, 2004  . Anxiety   . Arthritis   . Benign esophageal stricture 10/14/2012  . CERUMEN  IMPACTION, BILATERAL 12/13/2007  . CERVICAL RADICULOPATHY 11/05/2009  . Chronic kidney disease    kidney stones  . DEPRESSION 03/11/2007  . DIVERTICULOSIS, COLON 03/11/2007  . Esophagitis, reflux 10/14/2012  . GERD (gastroesophageal reflux disease) 10/14/2012  . Glaucoma   . 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 Hearing loss   . HEMORRHOIDS, EXTERNAL W/O COMPLICATION [id number]   Qualifier: Diagnosis of  By: Jimmye Norman, LPN, [place]   . HYPERLIPIDEMIA 09/22/2007  . HYPERTENSION 03/11/2007  . Kidney stone   . Osteopenia 02/2015   T score -1.4 FRAX 7.3%/0.6%  . Rectocele   . Rosacea 03/11/2007  . Tachycardia    Past Surgical History:  Procedure Laterality Date  . ANTERIOR CERVICAL DECOMP/DISCECTOMY FUSION  08/14/2011   Procedure:  ANTERIOR CERVICAL DECOMPRESSION/DISCECTOMY FUSION 1 LEVEL/HARDWARE REMOVAL;  Surgeon: [name], MD;  Location: [facility];  Service: Neurosurgery;  Laterality: N/A;  Cervical Six-Seven anterior cervical decompression with fusion interbody prothesis plating and bonegraft with removal of hardware of Cervical Four to Cervical Six   . CERVICAL FUSION     C4-6  . COLONOSCOPY     multiple  . ESOPHAGOGASTRODUODENOSCOPY  10/14/2012   Dr. [name]  . KIDNEY SURGERY     age 72  . TONSILLECTOMY     Social History   Social History Narrative   Lives with grown son. Widowed. 3 living children (lost son to MI 2019 and daughter to pancreatic cancer 2020) and 7 grandchildren. 3 greatgrandkids      Retired from [employer] after 5 years and later took care of children.       Hobbies: time with grandkids, shopping, go out to eat. Travels once a year with girlfriends.    family history includes Cerebral palsy in her sister; Diabetes in her maternal grandmother; Glaucoma in her mother; Heart attack in her son; Heart disease in her father; Hypertension in her father; Lung cancer in her father; Mental illness in her mother; Pancreatic cancer in her daughter; Stroke in her paternal aunt, paternal aunt, paternal uncle, paternal uncle, paternal uncle, and sister.   Review of Systems As above Objective:   Physical Exam BP 112/68   Pulse 64   Temp 97.8 F (36.6 C)   Ht 5\' 1"  (1.549 m)   Wt 122 lb 12.8 oz (55.7 kg)   LMP 08/12/1991   SpO2 98%   BMI 23.20 kg/m  Well-developed no acute distress petite thin white woman The abdomen is soft and nontender without organomegaly or mass   21 minutes total time on this encounter

## 2020-01-06 ENCOUNTER — Other Ambulatory Visit: Payer: Self-pay | Admitting: Family Medicine

## 2020-01-12 ENCOUNTER — Other Ambulatory Visit: Payer: Self-pay | Admitting: Internal Medicine

## 2020-01-12 ENCOUNTER — Other Ambulatory Visit: Payer: Self-pay | Admitting: Family Medicine

## 2020-01-24 ENCOUNTER — Ambulatory Visit (INDEPENDENT_AMBULATORY_CARE_PROVIDER_SITE_OTHER): Payer: Medicare Other | Admitting: Family Medicine

## 2020-01-24 ENCOUNTER — Other Ambulatory Visit: Payer: Self-pay

## 2020-01-24 ENCOUNTER — Encounter: Payer: Self-pay | Admitting: Family Medicine

## 2020-01-24 VITALS — BP 122/70 | HR 60 | Temp 98.2°F | Ht 61.0 in | Wt 120.0 lb

## 2020-01-24 DIAGNOSIS — F3342 Major depressive disorder, recurrent, in full remission: Secondary | ICD-10-CM

## 2020-01-24 DIAGNOSIS — E119 Type 2 diabetes mellitus without complications: Secondary | ICD-10-CM | POA: Diagnosis not present

## 2020-01-24 DIAGNOSIS — Z8679 Personal history of other diseases of the circulatory system: Secondary | ICD-10-CM

## 2020-01-24 DIAGNOSIS — R1032 Left lower quadrant pain: Secondary | ICD-10-CM | POA: Diagnosis not present

## 2020-01-24 DIAGNOSIS — E1159 Type 2 diabetes mellitus with other circulatory complications: Secondary | ICD-10-CM

## 2020-01-24 DIAGNOSIS — E1169 Type 2 diabetes mellitus with other specified complication: Secondary | ICD-10-CM

## 2020-01-24 DIAGNOSIS — I700 Atherosclerosis of aorta: Secondary | ICD-10-CM

## 2020-01-24 DIAGNOSIS — E785 Hyperlipidemia, unspecified: Secondary | ICD-10-CM

## 2020-01-24 DIAGNOSIS — I10 Essential (primary) hypertension: Secondary | ICD-10-CM

## 2020-01-24 DIAGNOSIS — I152 Hypertension secondary to endocrine disorders: Secondary | ICD-10-CM

## 2020-01-24 LAB — COMPREHENSIVE METABOLIC PANEL
ALT: 12 U/L (ref 0–35)
AST: 13 U/L (ref 0–37)
Albumin: 4.4 g/dL (ref 3.5–5.2)
Alkaline Phosphatase: 49 U/L (ref 39–117)
BUN: 13 mg/dL (ref 6–23)
CO2: 31 mEq/L (ref 19–32)
Calcium: 9.5 mg/dL (ref 8.4–10.5)
Chloride: 99 mEq/L (ref 96–112)
Creatinine, Ser: 0.79 mg/dL (ref 0.40–1.20)
GFR: 69.84 mL/min (ref >60.00)
Glucose, Bld: 114 mg/dL — ABNORMAL HIGH (ref 70–99)
Potassium: 3.9 mEq/L (ref 3.5–5.1)
Sodium: 137 mEq/L (ref 135–145)
Total Bilirubin: 0.6 mg/dL (ref 0.2–1.2)
Total Protein: 6.5 g/dL (ref 6.0–8.3)

## 2020-01-24 LAB — POC URINALSYSI DIPSTICK (AUTOMATED)
Bilirubin, UA: NEGATIVE
Blood, UA: NEGATIVE
Glucose, UA: NEGATIVE
Ketones, UA: NEGATIVE
Leukocytes, UA: NEGATIVE
Nitrite, UA: NEGATIVE
Protein, UA: NEGATIVE
Spec Grav, UA: 1.02 (ref 1.010–1.025)
Urobilinogen, UA: 0.2 E.U./dL
pH, UA: 6.5 (ref 5.0–8.0)

## 2020-01-24 LAB — CBC WITH DIFFERENTIAL/PLATELET
Basophils Absolute: 0.1 10*3/uL (ref 0.0–0.1)
Basophils Relative: 0.8 % (ref 0.0–3.0)
Eosinophils Absolute: 0.1 10*3/uL (ref 0.0–0.7)
Eosinophils Relative: 1.3 % (ref 0.0–5.0)
HCT: 39.7 % (ref 36.0–46.0)
Hemoglobin: 13.2 g/dL (ref 12.0–15.0)
Lymphocytes Relative: 32.6 % (ref 12.0–46.0)
Lymphs Abs: 2.5 10*3/uL (ref 0.7–4.0)
MCHC: 33.3 g/dL (ref 30.0–36.0)
MCV: 95.2 fl (ref 78.0–100.0)
Monocytes Absolute: 0.6 10*3/uL (ref 0.1–1.0)
Monocytes Relative: 7.4 % (ref 3.0–12.0)
Neutro Abs: 4.5 10*3/uL (ref 1.4–7.7)
Neutrophils Relative %: 57.9 % (ref 43.0–77.0)
Platelets: 270 10*3/uL (ref 150.0–400.0)
RBC: 4.17 Mil/uL (ref 3.87–5.11)
RDW: 12.7 % (ref 11.5–15.5)
WBC: 7.8 10*3/uL (ref 4.0–10.5)

## 2020-01-24 LAB — HEMOGLOBIN A1C: Hgb A1c MFr Bld: 6.9 % — ABNORMAL HIGH (ref 4.6–6.5)

## 2020-01-24 MED ORDER — AMOXICILLIN-POT CLAVULANATE 875-125 MG PO TABS: 1.0000 | ORAL_TABLET | Freq: Two times a day (BID) | ORAL | 0 refills | Status: AC

## 2020-01-24 MED ORDER — ATORVASTATIN CALCIUM 10 MG PO TABS: 10.0000 mg | ORAL_TABLET | ORAL | 3 refills | Status: DC

## 2020-01-24 MED ORDER — ALPRAZOLAM 0.25 MG PO TABS: 0.2500 mg | ORAL_TABLET | Freq: Every day | ORAL | 1 refills | Status: DC | PRN

## 2020-01-24 NOTE — Patient Instructions (Addendum)
So sorry for the loss of your son  I thought it would be best to get a CT scan of your abdomen and pelvis given ongoing pain-with all you have going on you preferred to try antibiotic first and then we would consider CT scan as well as colonoscopy if not improving-please please please let me know if you do not improve  Please stop by lab before you go If you have mychart- we will send your results within 3 business days of [place] receiving them.  If you do not have mychart- we will call you about results within 5 business days of [place] receiving them.

## 2020-01-24 NOTE — Addendum Note (Signed)
Addended by: Doran Clay A on: 01/24/2020 11:25 AM   Modules accepted: Orders

## 2020-01-24 NOTE — Progress Notes (Signed)
Phone [phone] In person visit   Subjective:   [name] is a 79 y.o. year old very pleasant female patient who presents for/with See problem oriented charting Chief Complaint  Patient presents with  . Follow-up  . Hypertension  . Diabetes   This visit occurred during the SARS-CoV-2 public health emergency.  Safety protocols were in place, including screening questions prior to the visit, additional usage of staff PPE, and extensive cleaning of exam room while observing appropriate contact time as indicated for disinfecting solutions.   Past Medical History-  Patient Active Problem List   Diagnosis Date Noted  . Diabetes mellitus without complication ([place]) 75/64/3329    Priority: High  . History of supraventricular tachycardia 08/14/2011    Priority: High  . GERD (gastroesophageal reflux disease) 07/14/2014    Priority: Medium  . Primary open angle glaucoma of both eyes, mild stage 05/29/2014    Priority: Medium  . IBS (irritable bowel syndrome)- diarrhea predominant 01/26/2013    Priority: Medium  . Glaucoma associated with ocular disorder 11/05/2009    Priority: Medium  . Hyperlipidemia associated with type 2 diabetes mellitus ([place]) 09/22/2007    Priority: Medium  . Major depression in full remission ([place])- Lexapro 20mg . Xanax once a week or less. 03/11/2007    Priority: Medium  . Hypertension associated with diabetes ([place]) 03/11/2007    Priority: Medium  . History of colonic polyps 11/28/2011    Priority: Low  . Cervical disc disorder with radiculopathy of cervical region 11/05/2009    Priority: Low  . Rosacea 03/11/2007    Priority: Low  . Aortic atherosclerosis ([place]) 07/21/2019  . Knee pain, left 11/02/2013  . Senile nuclear sclerosis 06/29/2011    Medications- reviewed and updated Current Outpatient Medications  Medication Sig Dispense Refill  . acetaminophen (TYLENOL) 325 MG tablet Take 650 mg by mouth every 6 (six) hours as needed for moderate pain.     [name] ALPRAZolam (XANAX) 0.25 MG tablet Take 1 tablet (0.25 mg total) by mouth daily as needed. for anxiety 30 tablet 1  . atorvastatin (LIPITOR) 10 MG tablet Take 1 tablet (10 mg total) by mouth once a week. 13 tablet 3  . Calcium Carbonate-Vitamin D (CALCIUM + D PO) Take 1 tablet by mouth daily.      [name] diltiazem (CARDIZEM CD) 240 MG 24 hr capsule TAKE 1 CAPSULE BY MOUTH EVERY DAY 90 capsule 3  . dorzolamide-timolol (COSOPT) 22.3-6.8 MG/ML ophthalmic solution Place 1 drop into both eyes 2 (two) times daily.    [name] escitalopram (LEXAPRO) 20 MG tablet TAKE 1 TABLET BY MOUTH EVERY DAY 90 tablet 1  . hydrochlorothiazide (HYDRODIURIL) 25 MG tablet Take 1 tablet (25 mg total) by mouth daily. 90 tablet 3  . meclizine (ANTIVERT) 25 MG tablet Take 1 tablet (25 mg total) by mouth 3 (three) times daily as needed for dizziness or nausea (vertigo). 30 tablet 0  . pantoprazole (PROTONIX) 40 MG tablet TAKE 1 TABLET BY MOUTH EVERY DAY BEFORE BREAKFAST 90 tablet 0  . PEPPERMINT OIL PO Take 2 capsules by mouth 3 (three) times daily as needed. IB Gard    . valsartan (DIOVAN) 320 MG tablet TAKE 1 TABLET BY MOUTH EVERY DAY 90 tablet 3  . amoxicillin-clavulanate (AUGMENTIN) 875-125 MG tablet Take 1 tablet by mouth 2 (two) times daily for 7 days. 14 tablet 0   No current facility-administered medications for this visit.     Objective:  BP 122/70   Pulse 60  Temp 98.2 F (36.8 C)   Ht 5\' 1"  (1.549 m)   Wt 120 lb (54.4 kg)   LMP 08/12/1991   SpO2 97%   BMI 22.67 kg/m  Gen: NAD, resting comfortably CV: RRR no murmurs rubs or gallops Lungs: CTAB no crackles, wheeze, rhonchi Abdomen: soft/nontender except significant pain with deep palpation in LLQ/nondistended/normal bowel sounds. No rebound or guarding.  Ext: no edema Skin: warm, dry    Assessment and Plan   #social update- lost another son may 2021. Offered condolences. He had lived with her. 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 She wonders  about drugs  Diverticulitis S: pt states she thinks her diverticulitis is flaring up and she thinks she needs a antibiotic to calm it down.  Ongoing issue for the last year with flares for several days in a row  She states she is getting random severe pains associated with meals often time. No burning with peeing or frequent urination A/P: Patient does have significant left lower quadrant pain on exam and this could represent diverticulitis.  We will update blood work with CBC with differential, CMP as well as urine but we will go ahead and treat for left lower quadrant diverticulitis with Augmentin.  She agrees if symptoms are not improving or worsening to let us know.  -With how long this has been going on I told her I thought that made it less likely to be diverticulitis but she has a lot of affairs to attend to with recent passing of her son-would be hard to get CT scan set up-she agrees to do this if she fails to improve or if symptoms persist or worsen.   # Diabetes S: Diet controlled.  New diagnosis in 2020 after 2 out of 3 last A1c is over 6.5.  Lab Results  Component Value Date   HGBA1C 6.5 07/21/2019   HGBA1C 6.6 (H) 03/17/2019   HGBA1C 6.3 08/16/2018  A/P: hopefully controlled-continue without medication as long as A1c under 7  #hyperlipidemia associated with diabetes/aortic atherosclerosis-incidental finding on prior imaging S:  trial 10mg  atorvastatin once a week 07/21/2019-it appears this was sent in incorrectly to take daily and only 4 tablets was given Lab Results  Component Value Date   CHOL 232 (H) 08/16/2018   HDL 36.60 (L) 08/16/2018   LDLCALC 82 10/10/2013   LDLDIRECT 135.0 08/16/2018   TRIG 247.0 (H) 08/16/2018   CHOLHDL 6 08/16/2018   A/P: Patient is not sure she is taking medication at this time.  We will send this in again and recheck lipid panel at next visit.  With aortic atherosclerosis we will prefer LDL under 70  #hypertension/history of SVT. S: compliant  with Cardizem 240mg , HCTZ 25mg  and Diovan 320mg   No obvious recurrence of SVT on Cardizem. A/P: Controlled-continue current medication.  #Depression/anxiety- lost son to MI 2019 and daughter to pancreatic cancer 2020 and another son to MI 2021 S: Patient compliant with Lexapro 20 mg.  Patient takes sparing alprazolam particularly with recent losses Depression screen [facility] 2/9 01/24/2020 07/21/2019 07/21/2019  Decreased Interest 0 0 0  Down, Depressed, Hopeless 0 3 1  PHQ - 2 Score 0 3 1  Altered sleeping 0 0 -  Tired, decreased energy 1 1 -  Change in appetite 0 0 -  Feeling bad or failure about yourself  0 0 -  Trouble concentrating 0 0 -  Moving slowly or fidgety/restless 0 0 -  Suicidal thoughts 0 0 -  PHQ-9 Score 1 4 -  Difficult doing work/chores Not difficult at all Not difficult at all -  A/P: Remains in full remission-continue current medication. Refill alprazolam for anxiety  #GERD- with small hiatal hernia on CT S: Patient is compliant with Protonix 40 mg. Lab Results  Component Value Date   GLOVFIEP32 951 07/21/2019  A/P: Good control-continue current medications for now.  May consider reducing to 20 mg at follow-up if things are more stable in her life  Recommended follow up: Return in about 6 months (around 07/25/2020) for follow up- or sooner if needed. possible medicare wellness visit at same time. .  Lab/Order associations:   ICD-10-CM   1. Diabetes mellitus without complication (HCC)  O84.1 CBC with Differential/Platelet    Comprehensive metabolic panel    Hemoglobin A1c    POCT Urinalysis Dipstick (Automated)  2. Hypertension associated with diabetes ([place])  E11.59    I10   3. Hyperlipidemia associated with type 2 diabetes mellitus ([place])  E11.69    E78.5   4. LLQ pain  R10.32 CBC with Differential/Platelet    Comprehensive metabolic panel    POCT Urinalysis Dipstick (Automated)  5. Major depression, recurrent, full remission ([place]) Chronic F33.42   6. Aortic  atherosclerosis (HCC) Chronic I70.0   7. History of supraventricular tachycardia Chronic Z86.79     Meds ordered this encounter  Medications  . ALPRAZolam (XANAX) 0.25 MG tablet    Sig: Take 1 tablet (0.25 mg total) by mouth daily as needed. for anxiety    Dispense:  30 tablet    Refill:  1    Not to exceed 5 additional fills before 01/17/2020.  [name] amoxicillin-clavulanate (AUGMENTIN) 875-125 MG tablet    Sig: Take 1 tablet by mouth 2 (two) times daily for 7 days.    Dispense:  14 tablet    Refill:  0  . atorvastatin (LIPITOR) 10 MG tablet    Sig: Take 1 tablet (10 mg total) by mouth once a week.    Dispense:  13 tablet    Refill:  3   Return precautions advised.  Garret Reddish, MD

## 2020-02-13 ENCOUNTER — Other Ambulatory Visit: Payer: Self-pay | Admitting: Family Medicine

## 2020-03-08 ENCOUNTER — Telehealth: Payer: Self-pay | Admitting: Family Medicine

## 2020-03-08 NOTE — Telephone Encounter (Signed)
Fyi has app with you on 7/30

## 2020-03-08 NOTE — Telephone Encounter (Signed)
Patient is scheduled for tomorrow with Sam.  Nurse Assessment Nurse: Markus Daft, RN, Sherre Poot Date/Time [name] Time): 03/08/2020 9:55:04 AM Confirm and document reason for call. If symptomatic, describe symptoms. ---Caller states she has been having bilateral ear pain (right > left) and dizzy spells like vertigo which is causing N/V. S/S started in last 2 days. Yesterday, she put in ear gtts for the ear congestion like "clogged up". Then started with dizziness and N/V. --- This am, she had ginger ale. No dizziness today, however, her balance is still off and has weakness. Has the patient had close contact with a person known or suspected to have the novel coronavirus illness OR traveled / lives in area with major community spread (including international travel) in the last 14 days from the onset of symptoms? * If Asymptomatic, screen for exposure and travel within the last 14 days. ---No Does the patient have any new or worsening symptoms? ---Yes Will a triage be completed? ---Yes Related visit to physician within the last 2 weeks? ---No Does the PT have any chronic conditions? (i.e. diabetes, asthma, this includes High risk factors for pregnancy, etc.) ---Yes List chronic conditions. ---COVID vaccinations x 2; HTN; diverticulosis, IBS, glaucoma Is this a behavioral health or substance abuse call? ---No Guidelines Guideline Title Affirmed Question Affirmed Notes Nurse Date/Time (Eastern Time) Earache Earache (Exceptions: brief ear pain of < [address] RN, [place] 03/08/2020 9:59:22 AM PLEASE NOTE: All timestamps contained within this report are represented as [place] Standard Time. CONFIDENTIALTY NOTICE: This fax transmission is intended only for the addressee. It contains information that is legally privileged, confidential or otherwise protected from use or disclosure. If you are not the intended recipient, you are strictly prohibited from reviewing, disclosing, copying using or  disseminating any of this information or taking any action in reliance on or regarding this information. If you have received this fax in error, please notify us immediately by telephone so that we can arrange for its return to [place]. Phone: [phone], Toll-Free: [phone], Fax: [phone] Page: 2 of 2 Call Id: [id number] Guidelines Guideline Title Affirmed Question Affirmed Notes Nurse Date/Time [name] Time) minutes duration, earache occurring during air travel Disp. Time [name] Time) Disposition Final User 03/08/2020 10:01:29 AM See PCP within 24 Hours Yes Markus Daft, RN, Sherre Poot Caller Disagree/Comply Comply Caller Understands Yes PreDisposition Go to Urgent Care/Walk-In Clinic Care Advice Given Per Guideline SEE PCP WITHIN 24 HOURS: * IF OFFICE WILL BE OPEN: You need to be examined within the next 24 hours. Call your doctor (or NP/PA) when the office opens and make an appointment. REASSURANCE AND EDUCATION: * You may have an ear infection or some other cause of ear pain; but it doesn't sound serious. You need to be examined in the next 24 hours to find out the exact cause. * In the meantime, the following measures may help. PAIN MEDICINES: * For pain relief, you can take either acetaminophen, ibuprofen, or naproxen. PAIN MEDICINES - EXTRA NOTES AND WARNINGS: * Before taking any medicine, read all the instructions on the package. CALL BACK IF: * Severe pain persists over 2 hours after pain medicine * You become worse. CARE ADVICE given per Earache (Adult) guideline. Referrals REFERRED TO PCP OFFICE Warm transfer to backline

## 2020-03-09 ENCOUNTER — Encounter: Payer: Self-pay | Admitting: Physician Assistant

## 2020-03-09 ENCOUNTER — Other Ambulatory Visit: Payer: Self-pay

## 2020-03-09 ENCOUNTER — Ambulatory Visit (INDEPENDENT_AMBULATORY_CARE_PROVIDER_SITE_OTHER): Payer: Medicare Other | Admitting: Physician Assistant

## 2020-03-09 VITALS — BP 122/70 | HR 70 | Temp 98.0°F | Ht 61.0 in | Wt 117.5 lb

## 2020-03-09 DIAGNOSIS — R311 Benign essential microscopic hematuria: Secondary | ICD-10-CM | POA: Diagnosis not present

## 2020-03-09 DIAGNOSIS — R42 Dizziness and giddiness: Secondary | ICD-10-CM | POA: Diagnosis not present

## 2020-03-09 DIAGNOSIS — R5381 Other malaise: Secondary | ICD-10-CM

## 2020-03-09 DIAGNOSIS — R110 Nausea: Secondary | ICD-10-CM | POA: Diagnosis not present

## 2020-03-09 LAB — COMPREHENSIVE METABOLIC PANEL
AG Ratio: 1.8 (calc) (ref 1.0–2.5)
ALT: 14 U/L (ref 6–29)
AST: 14 U/L (ref 10–35)
Albumin: 4.2 g/dL (ref 3.6–5.1)
Alkaline phosphatase (APISO): 52 U/L (ref 37–153)
BUN: 18 mg/dL (ref 7–25)
CO2: 32 mmol/L (ref 20–32)
Calcium: 10 mg/dL (ref 8.6–10.4)
Chloride: 99 mmol/L (ref 98–110)
Creat: 0.8 mg/dL (ref 0.60–0.88)
Globulin: 2.3 g/dL (calc) (ref 1.9–3.7)
Glucose, Bld: 90 mg/dL (ref 65–99)
Potassium: 3.7 mmol/L (ref 3.5–5.3)
Sodium: 138 mmol/L (ref 135–146)
Total Bilirubin: 0.6 mg/dL (ref 0.2–1.2)
Total Protein: 6.5 g/dL (ref 6.1–8.1)

## 2020-03-09 LAB — POC URINALSYSI DIPSTICK (AUTOMATED)
Bilirubin, UA: NEGATIVE
Glucose, UA: NEGATIVE
Ketones, UA: NEGATIVE
Nitrite, UA: NEGATIVE
Protein, UA: NEGATIVE
Spec Grav, UA: 1.02 (ref 1.010–1.025)
Urobilinogen, UA: 0.2 E.U./dL
pH, UA: 6 (ref 5.0–8.0)

## 2020-03-09 LAB — CBC WITH DIFFERENTIAL/PLATELET
Absolute Monocytes: 713 cells/uL (ref 200–950)
Basophils Absolute: 48 cells/uL (ref 0–200)
Basophils Relative: 0.5 %
Eosinophils Absolute: 86 cells/uL (ref 15–500)
Eosinophils Relative: 0.9 %
HCT: 41.3 % (ref 35.0–45.0)
Hemoglobin: 13.9 g/dL (ref 11.7–15.5)
Lymphs Abs: 3496 cells/uL (ref 850–3900)
MCH: 31.6 pg (ref 27.0–33.0)
MCHC: 33.7 g/dL (ref 32.0–36.0)
MCV: 93.9 fL (ref 80.0–100.0)
MPV: 9.6 fL (ref 7.5–12.5)
Monocytes Relative: 7.5 %
Neutro Abs: 5159 cells/uL (ref 1500–7800)
Neutrophils Relative %: 54.3 %
Platelets: 299 10*3/uL (ref 140–400)
RBC: 4.4 10*6/uL (ref 3.80–5.10)
RDW: 11.8 % (ref 11.0–15.0)
Total Lymphocyte: 36.8 %
WBC: 9.5 10*3/uL (ref 3.8–10.8)

## 2020-03-09 MED ORDER — CEPHALEXIN 500 MG PO CAPS
500.0000 mg | ORAL_CAPSULE | Freq: Three times a day (TID) | ORAL | 0 refills | Status: DC
Start: 2020-03-09 — End: 2020-07-25

## 2020-03-09 MED ORDER — ONDANSETRON HCL 4 MG PO TABS: 4.0000 mg | ORAL_TABLET | Freq: Three times a day (TID) | ORAL | 0 refills | Status: DC | PRN

## 2020-03-09 NOTE — Patient Instructions (Signed)
It was great to see you!  I am treating you for possible urinary tract infection.  Please start the antibiotic that I have sent in for you.  I have also sent in Zofran to use as needed for nausea.  If you have worsening headache, dizziness, chest pain, shortness of breath or other concerns please go to the ER.  I will be in touch with your lab results.  Take care,  Inda Coke PA-C

## 2020-03-09 NOTE — Progress Notes (Signed)
[name] is a 79 y.o. female here for a new problem.  I acted as a Education administrator for [employer], PA-C [employer], LPN   History of Present Illness:   Chief Complaint  Patient presents with   Otalgia    HPI   Otalgia, Dizziness, Malaise Pt c/o pain in right ear x 3 days, but now feels full. Trouble hearing. Pt had dizziness and vomited x 3 Wednesday night. No diarrhea.  Patient reports that she has had a slight headache.  She was able to get her hair done earlier this morning without any issues.  She is taking all of her medications without any concerns.  She does report that she is dehydrated because whenever she drinks she feels slightly nauseous.  She denies any specific concerns regarding her bladder or possible urinary tract infection.  She states that she overall feels fatigued and wants to lay in bed.  She denies any known contacts with Covid and she is fully COVID vaccinated.  She denies chest pain, shortness of breath, severe headache, vision changes, lower extremity swelling.  BP Readings from Last 3 Encounters:  03/09/20 122/70  01/24/20 122/70  12/22/19 112/68     Past Medical History:  Diagnosis Date   Adenomatous colon polyp 2006   Anxiety    Arthritis    Benign esophageal stricture 10/14/2012   CERUMEN IMPACTION, BILATERAL 12/13/2007   CERVICAL RADICULOPATHY 11/05/2009   Chronic kidney disease    kidney stones   DEPRESSION 03/11/2007   DIVERTICULOSIS, COLON 03/11/2007   Esophagitis, reflux 10/14/2012   GERD (gastroesophageal reflux disease) 10/14/2012   Glaucoma    GLAUCOMA ASSOCIATED W/UNSPEC OCULAR DISORDER 11/05/2009   Hearing loss    HEMORRHOIDS, EXTERNAL W/O COMPLICATION 3/97/6734   Qualifier: Diagnosis of  By: Jimmye Norman, LPN, Bonnye M    HYPERLIPIDEMIA 09/22/2007   HYPERTENSION 03/11/2007   Kidney stone    Osteopenia 02/2015   T score -1.4 FRAX 7.3%/0.6%   Rectocele    Rosacea 03/11/2007   Tachycardia      Social History    Tobacco Use   Smoking status: Never Smoker   Smokeless tobacco: Never Used  Vaping Use   Vaping Use: Never used  Substance Use Topics   Alcohol use: No    Alcohol/week: 0.0 standard drinks   Drug use: No    Past Surgical History:  Procedure Laterality Date   ANTERIOR CERVICAL DECOMP/DISCECTOMY FUSION  08/14/2011   Procedure: ANTERIOR CERVICAL DECOMPRESSION/DISCECTOMY FUSION 1 LEVEL/HARDWARE REMOVAL;  Surgeon: [name], MD;  Location: [facility];  Service: Neurosurgery;  Laterality: N/A;  Cervical Six-Seven anterior cervical decompression with fusion interbody prothesis plating and bonegraft with removal of hardware of Cervical Four to Cervical Six    CERVICAL FUSION     C4-6   COLONOSCOPY     multiple   ESOPHAGOGASTRODUODENOSCOPY  10/14/2012   Dr. [name]   KIDNEY SURGERY     age 41   TONSILLECTOMY      Family History  Problem Relation Age of Onset   Mental illness Mother        comitted suicide   Glaucoma Mother    Lung cancer Father        smoker   Hypertension Father    Heart disease Father        age 36, smoker   Cerebral palsy Sister    Stroke Sister        multiple   Diabetes Maternal Grandmother  Stroke Paternal Aunt    Stroke Paternal Uncle    Stroke Paternal Uncle    Stroke Paternal Uncle    Stroke Paternal Aunt    Pancreatic cancer Daughter    Heart attack Son        60   Heart attack Son        62   Colon cancer Neg Hx    Stomach cancer Neg Hx     Allergies  Allergen Reactions   Codeine Nausea Only    Current Medications:   Current Outpatient Medications:    acetaminophen (TYLENOL) 325 MG tablet, Take 650 mg by mouth every 6 (six) hours as needed for moderate pain., Disp: , Rfl:    ALPRAZolam (XANAX) 0.25 MG tablet, Take 1 tablet (0.25 mg total) by mouth daily as needed. for anxiety, Disp: 30 tablet, Rfl: 1   atorvastatin (LIPITOR) 10 MG tablet, Take 1 tablet (10 mg total) by mouth once a week.,  Disp: 13 tablet, Rfl: 3   Calcium Carbonate-Vitamin D (CALCIUM + D PO), Take 1 tablet by mouth daily.  , Disp: , Rfl:    diltiazem (CARDIZEM CD) 240 MG 24 hr capsule, TAKE 1 CAPSULE BY MOUTH EVERY DAY, Disp: 90 capsule, Rfl: 3   dorzolamide-timolol (COSOPT) 22.3-6.8 MG/ML ophthalmic solution, Place 1 drop into both eyes 2 (two) times daily., Disp: , Rfl:    escitalopram (LEXAPRO) 20 MG tablet, TAKE 1 TABLET BY MOUTH EVERY DAY, Disp: 90 tablet, Rfl: 1   hydrochlorothiazide (HYDRODIURIL) 25 MG tablet, TAKE 1 TABLET BY MOUTH EVERY DAY, Disp: 90 tablet, Rfl: 3   meclizine (ANTIVERT) 25 MG tablet, Take 1 tablet (25 mg total) by mouth 3 (three) times daily as needed for dizziness or nausea (vertigo)., Disp: 30 tablet, Rfl: 0   pantoprazole (PROTONIX) 40 MG tablet, TAKE 1 TABLET BY MOUTH EVERY DAY BEFORE BREAKFAST, Disp: 90 tablet, Rfl: 0   valsartan (DIOVAN) 320 MG tablet, TAKE 1 TABLET BY MOUTH EVERY DAY, Disp: 90 tablet, Rfl: 3   cephALEXin (KEFLEX) 500 MG capsule, Take 1 capsule (500 mg total) by mouth 3 (three) times daily., Disp: 15 capsule, Rfl: 0   ondansetron (ZOFRAN) 4 MG tablet, Take 1 tablet (4 mg total) by mouth every 8 (eight) hours as needed for nausea or vomiting., Disp: 20 tablet, Rfl: 0   Review of Systems:   ROS  Negative unless otherwise specified per HPI.   Vitals:   Vitals:   03/09/20 1412  BP: 122/70  Pulse: 70  Temp: 98 F (36.7 C)  TempSrc: Temporal  SpO2: 96%  Weight: 117 lb 8 oz (53.3 kg)  Height: 5\' 1"  (1.549 m)     Body mass index is 22.2 kg/m.  Orthostatic VS for the past 24 hrs (Last 3 readings):  BP- Lying Pulse- Lying BP- Sitting Pulse- Sitting BP- Standing at 0 minutes Pulse- Standing at 0 minutes  03/09/20 1500 124/70 63 124/70 69 118/68 69     Physical Exam:   Physical Exam Vitals and nursing note reviewed.  Constitutional:      General: She is not in acute distress.    Appearance: She is well-developed. She is not ill-appearing or  toxic-appearing.  HENT:     Head: Normocephalic.     Right Ear: Ear canal and external ear normal. A middle ear effusion (trace clear fluid) is present. Tympanic membrane is not perforated, erythematous or retracted.     Left Ear: Tympanic membrane, ear canal and external ear normal. Tympanic  membrane is not perforated, erythematous or retracted.  Cardiovascular:     Rate and Rhythm: Normal rate and regular rhythm.     Pulses: Normal pulses.     Heart sounds: Normal heart sounds, S1 normal and S2 normal.     Comments: No LE edema Pulmonary:     Effort: Pulmonary effort is normal.     Breath sounds: Normal breath sounds.  Skin:    General: Skin is warm and dry.  Neurological:     Mental Status: She is alert.     GCS: GCS eye subscore is 4. GCS verbal subscore is 5. GCS motor subscore is 6.  Psychiatric:        Speech: Speech normal.        Behavior: Behavior normal. Behavior is cooperative.     Results for orders placed or performed in visit on 03/09/20  POCT Urinalysis Dipstick (Automated)  Result Value Ref Range   Color, UA yellow    Clarity, UA clear    Glucose, UA Negative Negative   Bilirubin, UA Negative    Ketones, UA Negative    Spec Grav, UA 1.020 1.010 - 1.025   Blood, UA trace    pH, UA 6.0 5.0 - 8.0   Protein, UA Negative Negative   Urobilinogen, UA 0.2 0.2 or 1.0 E.U./dL   Nitrite, UA Negative    Leukocytes, UA Trace (A) Negative    Assessment and Plan:   Walterine was seen today for otalgia.  Diagnoses and all orders for this visit:  Dizziness Orthostatics were negative but she was symptomatic when standing.  I recommended that she push fluids.  We will also update a CBC and CMP today to evaluate for further possible causes of dizziness. -     POCT Urinalysis Dipstick (Automated) -     CBC with Differential/Platelet; Future -     Comprehensive metabolic panel; Future -     Comprehensive metabolic panel -     CBC with Differential/Platelet  Nausea;  Malaise Unclear etiology.  Possible viral illness.  No red flags on exam today.  Will have patient work on hydration, small frequent bland meals, and also was prescribed Zofran to use as needed for nausea.  Benign essential microscopic hematuria Will order urine culture for further evaluation.  Due to her symptoms I will go ahead and empirically treat for UTI with Keflex 500 mg 3 times daily for 5 days.  Will contact patient with urine culture result and advise further based on clinical response and results. -     Urine Culture; Future -     Urine Culture  Given multitude of symptoms, I discussed that if anything were to worsen, she should go to the ER for further evaluation and management.  Other orders -     cephALEXin (KEFLEX) 500 MG capsule; Take 1 capsule (500 mg total) by mouth 3 (three) times daily. -     ondansetron (ZOFRAN) 4 MG tablet; Take 1 tablet (4 mg total) by mouth every 8 (eight) hours as needed for nausea or vomiting.   Reviewed expectations re: course of current medical issues.  Discussed self-management of symptoms.  Outlined signs and symptoms indicating need for more acute intervention.  Patient verbalized understanding and all questions were answered.  See orders for this visit as documented in the electronic medical record.  Patient received an After-Visit Summary.  CMA or LPN served as scribe during this visit. History, Physical, and Plan performed by medical provider. The above  documentation has been reviewed and is accurate and complete.  Time spent with patient today was 25 minutes which consisted of chart review, discussing diagnosis, work up, treatment answering questions and documentation.   Inda Coke, PA-C

## 2020-03-10 LAB — URINE CULTURE
MICRO NUMBER:: 10770326
SPECIMEN QUALITY:: ADEQUATE

## 2020-03-12 ENCOUNTER — Telehealth: Payer: Self-pay | Admitting: Family Medicine

## 2020-03-12 NOTE — Telephone Encounter (Signed)
Pt would like to hear results from tests done on Friday. Also, her ears are still in extreme pain. She has been taking 500 mg of an antibiotic 3x a day and feeling no relief.

## 2020-03-12 NOTE — Telephone Encounter (Signed)
Patient has been scheduled for Thursday with Dr. Yong Channel

## 2020-03-12 NOTE — Telephone Encounter (Signed)
Pt c/o bilateral ear pain, has been in bed all week and wants to see Dr. Yong Channel not PA. Please schedule with Dr. Yong Channel.

## 2020-03-12 NOTE — Telephone Encounter (Signed)
Pt given results of urine, see result notes.

## 2020-03-12 NOTE — Telephone Encounter (Signed)
Pt called back following up from visit on Friday. Pt wanted to know her lab results and also states her ear is still in a lot of pain. Pt has been taking antibiotic 3x per day and it is not helping. Please advise.

## 2020-03-14 ENCOUNTER — Telehealth: Payer: Self-pay | Admitting: Family Medicine

## 2020-03-14 ENCOUNTER — Other Ambulatory Visit: Payer: Self-pay

## 2020-03-14 ENCOUNTER — Emergency Department (HOSPITAL_BASED_OUTPATIENT_CLINIC_OR_DEPARTMENT_OTHER): Payer: Medicare Other

## 2020-03-14 ENCOUNTER — Emergency Department (HOSPITAL_BASED_OUTPATIENT_CLINIC_OR_DEPARTMENT_OTHER)
Admission: EM | Admit: 2020-03-14 | Discharge: 2020-03-14 | Disposition: A | Discharge status: Home / Self Care | Payer: Medicare Other | Attending: Emergency Medicine | Admitting: Emergency Medicine

## 2020-03-14 ENCOUNTER — Encounter (HOSPITAL_BASED_OUTPATIENT_CLINIC_OR_DEPARTMENT_OTHER): Payer: Self-pay

## 2020-03-14 DIAGNOSIS — H81392 Other peripheral vertigo, left ear: Secondary | ICD-10-CM | POA: Diagnosis not present

## 2020-03-14 DIAGNOSIS — Z79899 Other long term (current) drug therapy: Secondary | ICD-10-CM | POA: Insufficient documentation

## 2020-03-14 DIAGNOSIS — I10 Essential (primary) hypertension: Secondary | ICD-10-CM | POA: Diagnosis not present

## 2020-03-14 DIAGNOSIS — H81399 Other peripheral vertigo, unspecified ear: Secondary | ICD-10-CM

## 2020-03-14 DIAGNOSIS — R42 Dizziness and giddiness: Secondary | ICD-10-CM | POA: Diagnosis present

## 2020-03-14 LAB — CBC WITH DIFFERENTIAL/PLATELET
Abs Immature Granulocytes: 0.04 10*3/uL (ref 0.00–0.07)
Basophils Absolute: 0 10*3/uL (ref 0.0–0.1)
Basophils Relative: 0 %
Eosinophils Absolute: 0.1 10*3/uL (ref 0.0–0.5)
Eosinophils Relative: 1 %
HCT: 41.4 % (ref 36.0–46.0)
Hemoglobin: 13.7 g/dL (ref 12.0–15.0)
Immature Granulocytes: 1 %
Lymphocytes Relative: 32 %
Lymphs Abs: 2.6 10*3/uL (ref 0.7–4.0)
MCH: 31.2 pg (ref 26.0–34.0)
MCHC: 33.1 g/dL (ref 30.0–36.0)
MCV: 94.3 fL (ref 80.0–100.0)
Monocytes Absolute: 0.5 10*3/uL (ref 0.1–1.0)
Monocytes Relative: 7 %
Neutro Abs: 4.9 10*3/uL (ref 1.7–7.7)
Neutrophils Relative %: 59 %
Platelets: 290 10*3/uL (ref 150–400)
RBC: 4.39 MIL/uL (ref 3.87–5.11)
RDW: 11.9 % (ref 11.5–15.5)
WBC: 8.1 10*3/uL (ref 4.0–10.5)
nRBC: 0 % (ref 0.0–0.2)

## 2020-03-14 LAB — COMPREHENSIVE METABOLIC PANEL
ALT: 15 U/L (ref 0–44)
AST: 17 U/L (ref 15–41)
Albumin: 4 g/dL (ref 3.5–5.0)
Alkaline Phosphatase: 57 U/L (ref 38–126)
Anion gap: 14 (ref 5–15)
BUN: 11 mg/dL (ref 8–23)
CO2: 29 mmol/L (ref 22–32)
Calcium: 9.2 mg/dL (ref 8.9–10.3)
Chloride: 96 mmol/L — ABNORMAL LOW (ref 98–111)
Creatinine, Ser: 0.71 mg/dL (ref 0.44–1.00)
GFR calc Af Amer: >60 mL/min (ref >60)
GFR calc non Af Amer: >60 mL/min (ref >60)
Glucose, Bld: 111 mg/dL — ABNORMAL HIGH (ref 70–99)
Potassium: 3.6 mmol/L (ref 3.5–5.1)
Sodium: 139 mmol/L (ref 135–145)
Total Bilirubin: 0.8 mg/dL (ref 0.3–1.2)
Total Protein: 7 g/dL (ref 6.5–8.1)

## 2020-03-14 MED ORDER — PREDNISONE 10 MG (21) PO TBPK
ORAL_TABLET | ORAL | 0 refills | Status: DC
Start: 2020-03-14 — End: 2020-07-25

## 2020-03-14 MED ORDER — MECLIZINE HCL 25 MG PO TABS
25.0000 mg | ORAL_TABLET | Freq: Once | ORAL | Status: AC
  Administered 2020-03-14: 25 mg via ORAL
  Filled 2020-03-14: qty 1

## 2020-03-14 MED ORDER — MECLIZINE HCL 25 MG PO TABS: 25.0000 mg | ORAL_TABLET | Freq: Three times a day (TID) | ORAL | 0 refills | Status: DC | PRN

## 2020-03-14 MED FILL — MECLIZINE 25 MG TABLET: 25 | 10 days supply | Qty: 30 | Fill #0

## 2020-03-14 MED FILL — predniSONE 10 MG TABS: 10 | 6 days supply | Qty: 21 | Fill #0

## 2020-03-14 NOTE — ED Notes (Signed)
Pt on monitor 

## 2020-03-14 NOTE — ED Provider Notes (Signed)
[facility] EMERGENCY DEPARTMENT Provider Note  CSN: [id number] Arrival date & time: 03/14/20 1055    History Chief Complaint  Patient presents with  . Dizziness    HPI  [name] is a 79 y.o. female presents for evaluation of about a week of room spinning dizziness and left ear pain. Saw PCP for same, given Rx for Abx due to middle ear effusion (Keflex). She has also been taking a vertigo medication she had previously, but does not know the name of it (presumably Meclizine). She reports symptoms are worse with sitting up and improved with lying down. She has had some occasional nausea and vomiting as well.    Past Medical History:  Diagnosis Date  . Adenomatous colon polyp 2006  . Anxiety   . Arthritis   . Benign esophageal stricture 10/14/2012  . CERUMEN IMPACTION, BILATERAL 12/13/2007  . CERVICAL RADICULOPATHY 11/05/2009  . Chronic kidney disease    kidney stones  . DEPRESSION 03/11/2007  . DIVERTICULOSIS, COLON 03/11/2007  . Esophagitis, reflux 10/14/2012  . GERD (gastroesophageal reflux disease) 10/14/2012  . Glaucoma   . 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 Hearing loss   . HEMORRHOIDS, EXTERNAL W/O COMPLICATION 11/17/1446   Qualifier: Diagnosis of  By: Jimmye Norman, LPN, [place]   . HYPERLIPIDEMIA 09/22/2007  . HYPERTENSION 03/11/2007  . Kidney stone   . Osteopenia 02/2015   T score -1.4 FRAX 7.3%/0.6%  . Rectocele   . Rosacea 03/11/2007  . Tachycardia     Past Surgical History:  Procedure Laterality Date  . ANTERIOR CERVICAL DECOMP/DISCECTOMY FUSION  08/14/2011   Procedure: ANTERIOR CERVICAL DECOMPRESSION/DISCECTOMY FUSION 1 LEVEL/HARDWARE REMOVAL;  Surgeon: [name], MD;  Location: [facility];  Service: Neurosurgery;  Laterality: N/A;  Cervical Six-Seven anterior cervical decompression with fusion interbody prothesis plating and bonegraft with removal of hardware of Cervical Four to Cervical Six   . CERVICAL FUSION     C4-6  .  COLONOSCOPY     multiple  . ESOPHAGOGASTRODUODENOSCOPY  10/14/2012   Dr. [name]  . KIDNEY SURGERY     age 65  . TONSILLECTOMY      Family History  Problem Relation Age of Onset  . Mental illness Mother        comitted suicide  . Glaucoma Mother   . Lung cancer Father        smoker  . Hypertension Father   . Heart disease Father        age 50, smoker  . Cerebral palsy Sister   . Stroke Sister        multiple  . Diabetes Maternal Grandmother   . Stroke Paternal Aunt   . Stroke Paternal Uncle   . Stroke Paternal Uncle   . Stroke Paternal Uncle   . Stroke Paternal Aunt   . Pancreatic cancer Daughter   . Heart attack Son        83  . Heart attack Son        89  . Colon cancer Neg Hx   . Stomach cancer Neg Hx     Social History   Tobacco Use  . Smoking status: Never Smoker  . Smokeless tobacco: Never Used  Vaping Use  . Vaping Use: Never used  Substance Use Topics  . Alcohol use: No    Alcohol/week: 0.0 standard drinks  . Drug use: No     Home Medications Prior to Admission medications   Medication Sig Start  Date End Date Taking? Authorizing Provider  acetaminophen (TYLENOL) 325 MG tablet Take 650 mg by mouth every 6 (six) hours as needed for moderate pain.    [provider]  ALPRAZolam Duanne Moron) 0.25 MG tablet Take 1 tablet (0.25 mg total) by mouth daily as needed. for anxiety 01/24/20   [name], MD  atorvastatin (LIPITOR) 10 MG tablet Take 1 tablet (10 mg total) by mouth once a week. 01/24/20   [name], MD  Calcium Carbonate-Vitamin D (CALCIUM + D PO) Take 1 tablet by mouth daily.      [provider]  cephALEXin (KEFLEX) 500 MG capsule Take 1 capsule (500 mg total) by mouth 3 (three) times daily. 03/09/20   Inda Coke, PA  diltiazem (CARDIZEM CD) 240 MG 24 hr capsule TAKE 1 CAPSULE BY MOUTH EVERY DAY 03/23/19   [name], MD  dorzolamide-timolol (COSOPT) 22.3-6.8 MG/ML ophthalmic solution Place 1 drop into  both eyes 2 (two) times daily. 06/29/17   [provider]  escitalopram (LEXAPRO) 20 MG tablet TAKE 1 TABLET BY MOUTH EVERY DAY 01/06/20   [name], MD  hydrochlorothiazide (HYDRODIURIL) 25 MG tablet TAKE 1 TABLET BY MOUTH EVERY DAY 02/14/20   [name], MD  meclizine (ANTIVERT) 25 MG tablet Take 1 tablet (25 mg total) by mouth 3 (three) times daily as needed for dizziness or nausea (vertigo). 03/14/20   Truddie Hidden, MD  ondansetron (ZOFRAN) 4 MG tablet Take 1 tablet (4 mg total) by mouth every 8 (eight) hours as needed for nausea or vomiting. 03/09/20   Inda Coke, PA  pantoprazole (PROTONIX) 40 MG tablet TAKE 1 TABLET BY MOUTH EVERY DAY BEFORE BREAKFAST 01/12/20   Gatha Mayer, MD  predniSONE (STERAPRED UNI-PAK 21 TAB) 10 MG (21) TBPK tablet 10mg  Tabs, 6 day taper. Use as directed 03/14/20   Truddie Hidden, MD  valsartan (DIOVAN) 320 MG tablet TAKE 1 TABLET BY MOUTH EVERY DAY 01/12/20   [name], MD     Allergies    Codeine   Review of Systems   Review of Systems A comprehensive review of systems was completed and negative except as noted in HPI.    Physical Exam BP 134/80 (BP Location: Right Arm)   Pulse 86   Temp 98.8 F (37.1 C) (Oral)   Resp 18   LMP 08/12/1991   SpO2 100%   Physical Exam Vitals and nursing note reviewed.  Constitutional:      Appearance: Normal appearance.  HENT:     Head: Normocephalic and atraumatic.     Right Ear: Tympanic membrane normal.     Left Ear: Tympanic membrane normal.     Nose: Nose normal.     Mouth/Throat:     Mouth: Mucous membranes are moist.  Eyes:     Extraocular Movements: Extraocular movements intact.     Conjunctiva/sclera: Conjunctivae normal.  Cardiovascular:     Rate and Rhythm: Normal rate.  Pulmonary:     Effort: Pulmonary effort is normal.     Breath sounds: Normal breath sounds.  Abdominal:     General: Abdomen is flat.     Palpations: Abdomen is soft.     Tenderness:  There is no abdominal tenderness.  Musculoskeletal:        General: No swelling. Normal range of motion.     Cervical back: Neck supple.  Skin:    General: Skin is warm and dry.  Neurological:  General: No focal deficit present.     Mental Status: She is alert.     Cranial Nerves: No cranial nerve deficit.     Sensory: No sensory deficit.     Motor: No weakness.     Comments: Mild nystagmus and dizziness with sitting  Psychiatric:        Mood and Affect: Mood normal.      ED Results / Procedures / Treatments   Labs (all labs ordered are listed, but only abnormal results are displayed) Labs Reviewed  COMPREHENSIVE METABOLIC PANEL - Abnormal; Notable for the following components:      Result Value   Chloride 96 (*)    Glucose, Bld 111 (*)    All other components within normal limits  CBC WITH DIFFERENTIAL/PLATELET    EKG EKG Interpretation  Date/Time:  Wednesday March 14 2020 13:17:56 EDT Ventricular Rate:  71 PR Interval:    QRS Duration: 104 QT Interval:  412 QTC Calculation: 448 R Axis:   -55 Text Interpretation: Sinus rhythm LAD, consider left anterior fascicular block Abnormal R-wave progression, late transition No significant change since last tracing Confirmed by Calvert Cantor [phone]) on 03/14/2020 2:02:48 PM    Radiology CT Head Wo Contrast  Result Date: 03/14/2020 CLINICAL DATA:  Dizziness. EXAM: CT HEAD WITHOUT CONTRAST TECHNIQUE: Contiguous axial images were obtained from the base of the skull through the vertex without intravenous contrast. COMPARISON:  None. FINDINGS: Brain: No evidence of acute infarction, hemorrhage, hydrocephalus, extra-axial collection or mass lesion/mass effect. Vascular: No hyperdense vessel or unexpected calcification. Skull: Normal. Negative for fracture or focal lesion. Sinuses/Orbits: No acute finding. Other: None. IMPRESSION: Normal head CT. Electronically Signed   By: Marijo Conception M.D.   On: 03/14/2020 13:41     Procedures Procedures  Medications Ordered in the ED Medications  meclizine (ANTIVERT) tablet 25 mg (25 mg Oral Given 03/14/20 1409)     MDM Rules/Calculators/A&P MDM Patient with symptoms consistent with BPPV, but persistent after a week. Will check labs and send for CT head. Meclizine for symptoms while in the ED.  ED Course  I have reviewed the triage vital signs and the nursing notes.  Pertinent labs & imaging results that were available during my care of the patient were reviewed by me and considered in my medical decision making (see chart for details).  Clinical Course as of Mar 14 1516  Wed Mar 14, 2020  1404 CBC is normal.    [CS]  6734 CT head negative.    [CS]  1937 CMP unremarkable.    [CS]  9024 Patient feeling better after Meclizine was able to walk to the bathroom without significant dizziness. Low concern for central cause of vertigo. Recommend continue meclizine at home, short course of steroids and followup with ENT if not improved.    [CS]    Clinical Course User Index [CS] Truddie Hidden, MD    Final Clinical Impression(s) / ED Diagnoses Final diagnoses:  Peripheral vertigo, unspecified laterality    Rx / DC Orders ED Discharge Orders         Ordered    meclizine (ANTIVERT) 25 MG tablet  3 times daily PRN     Discontinue  Reprint     03/14/20 1450    predniSONE (STERAPRED UNI-PAK 21 TAB) 10 MG (21) TBPK tablet     Discontinue  Reprint     03/14/20 1450           Truddie Hidden, MD 03/14/20  1517  

## 2020-03-14 NOTE — ED Triage Notes (Signed)
Pt c/o dizziness, n/v, pain left ear x 1 weeks-states she was seen by PCP last week-rx abx-states she already had "med for vertigo"-states she is no better after abx-NAD-to triage in w/c

## 2020-03-14 NOTE — Telephone Encounter (Signed)
Pt called to schedule her appt because she was in the ED today. Pt asked if Dr. Yong Channel could refer her to ENT. Please refer to ED notes.

## 2020-03-15 ENCOUNTER — Ambulatory Visit: Payer: Medicare Other | Admitting: Family Medicine

## 2020-03-15 ENCOUNTER — Other Ambulatory Visit: Payer: Self-pay

## 2020-03-15 DIAGNOSIS — R42 Dizziness and giddiness: Secondary | ICD-10-CM

## 2020-03-15 NOTE — Telephone Encounter (Signed)
Referral placed.

## 2020-04-06 DIAGNOSIS — R42 Dizziness and giddiness: Secondary | ICD-10-CM | POA: Insufficient documentation

## 2020-04-06 DIAGNOSIS — H903 Sensorineural hearing loss, bilateral: Secondary | ICD-10-CM | POA: Insufficient documentation

## 2020-04-18 ENCOUNTER — Other Ambulatory Visit: Payer: Self-pay | Admitting: Family Medicine

## 2020-04-21 ENCOUNTER — Other Ambulatory Visit: Payer: Self-pay | Admitting: Internal Medicine

## 2020-04-21 ENCOUNTER — Other Ambulatory Visit: Payer: Self-pay | Admitting: Family Medicine

## 2020-07-24 NOTE — Patient Instructions (Addendum)
  Health Maintenance Due  Topic Date Due  . OPHTHALMOLOGY EXAM Sign a release form at checkout.  Make sure they know you have DIET CONTROLLEd diabetes and are not requiring medicine yet Never done  . COVID-19 Vaccine (3 - Booster for [employer] series) She will call us with the date.  04/11/2020   If you would like- Please check with your pharmacy to see if they have the shingrix vaccine. If they do- please get this immunization and update [place] by phone call or mychart with dates you receive the vaccine  If have trigger finger again can try double bandaid splint as we showed you today  reflux well controlled on pantoprazole/protonix 40mg - we opted to try 20mg  dose and she will call us if this is not effective   blood pressure slightly high today but has not taken medicine today- has been controlled in past on medicine so we will hold off on medication changes and encouraged her if possible to take meds with water before next visit.   Recommended follow up: Return in about 6 months (around 01/23/2021) for follow up- or sooner if needed.

## 2020-07-24 NOTE — Progress Notes (Signed)
Phone [phone] In person visit   Subjective:   [name] is a 79 y.o. year old very pleasant female patient who presents for/with See problem oriented charting Chief Complaint  Patient presents with  . Hypertension  . Diabetes  . Hyperlipidemia   This visit occurred during the SARS-CoV-2 public health emergency.  Safety protocols were in place, including screening questions prior to the visit, additional usage of staff PPE, and extensive cleaning of exam room while observing appropriate contact time as indicated for disinfecting solutions.   Past Medical History-  Patient Active Problem List   Diagnosis Date Noted  . Diabetes mellitus without complication ([place]) 50/93/2671    Priority: High  . History of supraventricular tachycardia 08/14/2011    Priority: High  . GERD (gastroesophageal reflux disease) 07/14/2014    Priority: Medium  . Primary open angle glaucoma of both eyes, mild stage 05/29/2014    Priority: Medium  . IBS (irritable bowel syndrome)- diarrhea predominant 01/26/2013    Priority: Medium  . Glaucoma associated with ocular disorder 11/05/2009    Priority: Medium  . Hyperlipidemia associated with type 2 diabetes mellitus ([place]) 09/22/2007    Priority: Medium  . Major depression in full remission ([place])- Lexapro 20mg . Xanax once a week or less. 03/11/2007    Priority: Medium  . Hypertension associated with diabetes ([place]) 03/11/2007    Priority: Medium  . History of colonic polyps 11/28/2011    Priority: Low  . Cervical disc disorder with radiculopathy of cervical region 11/05/2009    Priority: Low  . Rosacea 03/11/2007    Priority: Low  . Vertigo 04/06/2020  . Sensorineural hearing loss (SNHL), bilateral 04/06/2020  . Aortic atherosclerosis ([place]) 07/21/2019  . Knee pain, left 11/02/2013  . Senile nuclear sclerosis 06/29/2011    Medications- reviewed and updated Current Outpatient Medications  Medication Sig Dispense Refill  . acetaminophen  (TYLENOL) 325 MG tablet Take 650 mg by mouth every 6 (six) hours as needed for moderate pain.    [name] ALPRAZolam (XANAX) 0.25 MG tablet Take 1 tablet (0.25 mg total) by mouth daily as needed. for anxiety 30 tablet 1  . atorvastatin (LIPITOR) 10 MG tablet Take 1 tablet (10 mg total) by mouth once a week. 13 tablet 3  . Calcium Carbonate-Vitamin D (CALCIUM + D PO) Take 1 tablet by mouth daily.    [name] diltiazem (CARDIZEM CD) 240 MG 24 hr capsule TAKE 1 CAPSULE BY MOUTH EVERY DAY 90 capsule 3  . dorzolamide-timolol (COSOPT) 22.3-6.8 MG/ML ophthalmic solution Place 1 drop into both eyes 2 (two) times daily.    [name] escitalopram (LEXAPRO) 20 MG tablet TAKE 1 TABLET BY MOUTH EVERY DAY 90 tablet 1  . hydrochlorothiazide (HYDRODIURIL) 25 MG tablet TAKE 1 TABLET BY MOUTH EVERY DAY 90 tablet 3  . valsartan (DIOVAN) 320 MG tablet TAKE 1 TABLET BY MOUTH EVERY DAY 90 tablet 3  . cephALEXin (KEFLEX) 500 MG capsule Take 1 capsule (500 mg total) by mouth 3 (three) times daily. (Patient not taking: Reported on 07/25/2020) 15 capsule 0  . meclizine (ANTIVERT) 25 MG tablet Take 1 tablet (25 mg total) by mouth 3 (three) times daily as needed for dizziness or nausea (vertigo). (Patient not taking: Reported on 07/25/2020) 30 tablet 0  . ondansetron (ZOFRAN) 4 MG tablet Take 1 tablet (4 mg total) by mouth every 8 (eight) hours as needed for nausea or vomiting. (Patient not taking: Reported on 07/25/2020) 20 tablet 0  . pantoprazole (PROTONIX) 20 MG tablet Take  1 tablet (20 mg total) by mouth daily. 90 tablet 3   No current facility-administered medications for this visit.     Objective:  BP (!) 147/67   Pulse 67   Temp 97.8 F (36.6 C) (Temporal)   Ht 5\' 1"  (1.549 m)   Wt 119 lb 12.8 oz (54.3 kg)   LMP 08/12/1991   SpO2 98%   BMI 22.64 kg/m  Gen: NAD, resting comfortably CV: RRR no murmurs rubs or gallops Lungs: CTAB no crackles, wheeze, rhonchi Ext: no edema Skin: warm, dry Unable to reproduce triggering of  bilateral middle fingers but did show patient how to do a double Band-Aid splint in case recurs    Assessment and Plan   # trigger finger- 3rd finger each hand intermittently discussed double bandaid splint option  #Abdominal pain for last visit thankfully improved-patient still reports intermittent abdominal pain issues.  History of diverticulitis  # neck tension on the left- has had 2 surgeries for bone spurs on the left side. Wonders if this is coming back. Dr. Carloyn Manner in the past and then later Dr. Vertell Limber. Has some weakness in left arm after this but has not worsened lately. Feeling more irritable when wind hits neck. She is hesitant about having surgery.  -discussed x-ray or sports medicine referral- she wants to hold off for now -she may call me back prior to next visit and just want x-ray or referral- team can order at that time under left neck pain  # Diabetes S: Diet controlled.  New diagnosis in 2020 after 2 out of 3 last A1c is over 6.5. Lab Results  Component Value Date   HGBA1C 6.9 (H) 01/24/2020   A/P: stable last visit- update a1c  #hyperlipidemia associated with diabetes/aortic atherosclerosis- incidental finding on prior imaging  S:  trial 10mg  atorvastatin once a week 07/21/2019- but didn't start until after last visit Lab Results  Component Value Date   CHOL 232 (H) 08/16/2018   HDL 36.60 (L) 08/16/2018   LDLCALC 82 10/10/2013   LDLDIRECT 135.0 08/16/2018   TRIG 247.0 (H) 08/16/2018   CHOLHDL 6 08/16/2018  A/P: hopefully cholesterol has improved- she started taking medicine since last visit- we will update cholesterol today- id like to get cholesterol at least under 100 on bad cholesterol- if above this consider higher dose due to aortic atherosclerosis  #hypertension/history of SVT. S: compliant with Cardizem 240mg , HCTZ 25mg  and Diovan 320mg   No obvious recurrence of SVT on Cardizem. BP Readings from Last 3 Encounters:  07/25/20 (!) 147/67  03/14/20 137/76   03/09/20 122/70  A/P: blood pressure slightly high today but has not taken medicine today- has been controlled in past on medicine so we will hold off on medication changes and encouraged her if possible to take meds with water before next visit.   #Depression/anxiety- lost son to MI 2019 and daughter to pancreatic cancer 2020 and another son to MI 2021 then granddaughter lost husband in [place] S: Patient compliant with Lexapro 20 mg.  Patient takes sparing alprazolam- takes half tablet perhaps twice a week. No falls A/P: depression reasonably controlled despite stressors. Would like to avoid alprazolam but very helpful to her and has not had falls so we opted to continue for now.    #GERD- with small hiatal hernia on CT S: Patient is compliant with Protonix 40 mg. A/P: reflux well controlled on pantoprazole/protonix 40mg - we opted to try 20mg  dose and she will call us if this is not effective  Recommended follow up: Return in about 6 months (around 01/23/2021) for follow up- or sooner if needed. Future Appointments  Date Time Provider [place]  02/06/2021  9:40 AM Yong Channel, Brayton Mars, MD [facility]    Lab/Order associations:   ICD-10-CM   1. Hypertension associated with diabetes ([place])  E11.59    I15.2   2. Gastroesophageal reflux disease without esophagitis  K21.9   3. Diabetes mellitus without complication ([facility])  Y85.0 Hemoglobin A1c    COMPLETE METABOLIC PANEL WITH GFR    Lipid Panel w/reflex Direct LDL    Lipid Panel w/reflex Direct LDL    COMPLETE METABOLIC PANEL WITH GFR    Hemoglobin A1c  4. Hyperlipidemia associated with type 2 diabetes mellitus ([place])  E11.69    E78.5   5. Major depressive disorder with single episode, in full remission ([place])  F32.5     Meds ordered this encounter  Medications  . pantoprazole (PROTONIX) 20 MG tablet    Sig: Take 1 tablet (20 mg total) by mouth daily.    Dispense:  90 tablet    Refill:  3   Return precautions advised.   Garret Reddish, MD

## 2020-07-25 ENCOUNTER — Encounter: Payer: Self-pay | Admitting: Family Medicine

## 2020-07-25 ENCOUNTER — Other Ambulatory Visit: Payer: Self-pay

## 2020-07-25 ENCOUNTER — Ambulatory Visit (INDEPENDENT_AMBULATORY_CARE_PROVIDER_SITE_OTHER): Payer: Medicare Other | Admitting: Family Medicine

## 2020-07-25 VITALS — BP 147/67 | HR 67 | Temp 97.8°F | Ht 61.0 in | Wt 119.8 lb

## 2020-07-25 DIAGNOSIS — K219 Gastro-esophageal reflux disease without esophagitis: Secondary | ICD-10-CM

## 2020-07-25 DIAGNOSIS — E1169 Type 2 diabetes mellitus with other specified complication: Secondary | ICD-10-CM | POA: Diagnosis not present

## 2020-07-25 DIAGNOSIS — F325 Major depressive disorder, single episode, in full remission: Secondary | ICD-10-CM

## 2020-07-25 DIAGNOSIS — E1159 Type 2 diabetes mellitus with other circulatory complications: Secondary | ICD-10-CM | POA: Diagnosis not present

## 2020-07-25 DIAGNOSIS — E119 Type 2 diabetes mellitus without complications: Secondary | ICD-10-CM | POA: Diagnosis not present

## 2020-07-25 DIAGNOSIS — I152 Hypertension secondary to endocrine disorders: Secondary | ICD-10-CM | POA: Diagnosis not present

## 2020-07-25 DIAGNOSIS — E785 Hyperlipidemia, unspecified: Secondary | ICD-10-CM

## 2020-07-25 MED ORDER — PANTOPRAZOLE SODIUM 20 MG PO TBEC: 20.0000 mg | DELAYED_RELEASE_TABLET | Freq: Every day | ORAL | 3 refills | Status: DC

## 2020-07-26 LAB — COMPLETE METABOLIC PANEL WITH GFR
AG Ratio: 1.9 (calc) (ref 1.0–2.5)
ALT: 15 U/L (ref 6–29)
AST: 14 U/L (ref 10–35)
Albumin: 4.2 g/dL (ref 3.6–5.1)
Alkaline phosphatase (APISO): 55 U/L (ref 37–153)
BUN: 13 mg/dL (ref 7–25)
CO2: 31 mmol/L (ref 20–32)
Calcium: 9.3 mg/dL (ref 8.6–10.4)
Chloride: 101 mmol/L (ref 98–110)
Creat: 0.72 mg/dL (ref 0.60–0.88)
GFR, Est African American: 91 mL/min/{1.73_m2} (ref >=60)
GFR, Est Non African American: 79 mL/min/{1.73_m2} (ref >=60)
Globulin: 2.2 g/dL (calc) (ref 1.9–3.7)
Glucose, Bld: 97 mg/dL (ref 65–99)
Potassium: 4.1 mmol/L (ref 3.5–5.3)
Sodium: 140 mmol/L (ref 135–146)
Total Bilirubin: 0.6 mg/dL (ref 0.2–1.2)
Total Protein: 6.4 g/dL (ref 6.1–8.1)

## 2020-07-26 LAB — LIPID PANEL W/REFLEX DIRECT LDL
Cholesterol: 216 mg/dL — ABNORMAL HIGH (ref <200)
HDL: 37 mg/dL — ABNORMAL LOW (ref >=50)
LDL Cholesterol (Calc): 132 mg/dL (calc) — ABNORMAL HIGH
Non-HDL Cholesterol (Calc): 179 mg/dL (calc) — ABNORMAL HIGH (ref <130)
Total CHOL/HDL Ratio: 5.8 (calc) — ABNORMAL HIGH (ref <5.0)
Triglycerides: 329 mg/dL — ABNORMAL HIGH (ref <150)

## 2020-07-26 LAB — HEMOGLOBIN A1C
Hgb A1c MFr Bld: 6.5 % of total Hgb — ABNORMAL HIGH (ref <5.7)
Mean Plasma Glucose: 140 mg/dL
eAG (mmol/L): 7.7 mmol/L

## 2020-07-30 ENCOUNTER — Telehealth: Payer: Self-pay

## 2020-07-30 NOTE — Telephone Encounter (Signed)
Pt's booster shot was 05/11/20 and it was [employer].

## 2020-07-30 NOTE — Telephone Encounter (Signed)
Booster documented. 

## 2020-08-01 ENCOUNTER — Other Ambulatory Visit: Payer: Self-pay

## 2020-08-01 MED ORDER — ATORVASTATIN CALCIUM 20 MG PO TABS
ORAL_TABLET | ORAL | 3 refills | Status: DC
Start: 2020-08-01 — End: 2021-02-07

## 2020-08-01 NOTE — Progress Notes (Signed)
at

## 2020-08-22 LAB — HM MAMMOGRAPHY

## 2020-09-04 ENCOUNTER — Telehealth: Payer: Self-pay | Admitting: Family Medicine

## 2020-09-04 NOTE — Telephone Encounter (Signed)
Tried calling patient to  schedule Medicare Annual Wellness Visit (AWV) either virtually or in office.  No answer  Last AWV 07/21/2019  please schedule at anytime with   This should be a 45 minute visit.

## 2020-09-14 ENCOUNTER — Other Ambulatory Visit: Payer: Self-pay | Admitting: Physician Assistant

## 2020-09-14 DIAGNOSIS — H9120 Sudden idiopathic hearing loss, unspecified ear: Secondary | ICD-10-CM

## 2020-09-14 DIAGNOSIS — R42 Dizziness and giddiness: Secondary | ICD-10-CM

## 2020-09-20 ENCOUNTER — Ambulatory Visit
Admission: RE | Admit: 2020-09-20 | Discharge: 2020-09-20 | Disposition: A | Discharge status: Home / Self Care | Payer: Medicare Other | Source: Ambulatory Visit | Attending: Physician Assistant | Admitting: Physician Assistant

## 2020-09-20 ENCOUNTER — Other Ambulatory Visit: Payer: Self-pay

## 2020-09-20 DIAGNOSIS — R42 Dizziness and giddiness: Secondary | ICD-10-CM

## 2020-09-20 DIAGNOSIS — H9120 Sudden idiopathic hearing loss, unspecified ear: Secondary | ICD-10-CM

## 2020-09-20 MED ORDER — GADOBENATE DIMEGLUMINE 529 MG/ML IV SOLN
10.0000 mL | Freq: Once | INTRAVENOUS | Status: AC | PRN
  Administered 2020-09-20: 10 mL via INTRAVENOUS

## 2020-09-22 ENCOUNTER — Other Ambulatory Visit: Payer: Self-pay | Admitting: Family Medicine

## 2020-09-27 ENCOUNTER — Ambulatory Visit (INDEPENDENT_AMBULATORY_CARE_PROVIDER_SITE_OTHER): Payer: Medicare Other | Admitting: Internal Medicine

## 2020-09-27 ENCOUNTER — Other Ambulatory Visit: Payer: Medicare Other

## 2020-09-27 ENCOUNTER — Encounter: Payer: Self-pay | Admitting: Internal Medicine

## 2020-09-27 VITALS — BP 130/64 | HR 68 | Ht 61.0 in | Wt 120.0 lb

## 2020-09-27 DIAGNOSIS — R159 Full incontinence of feces: Secondary | ICD-10-CM | POA: Diagnosis not present

## 2020-09-27 DIAGNOSIS — K529 Noninfective gastroenteritis and colitis, unspecified: Secondary | ICD-10-CM | POA: Diagnosis not present

## 2020-09-27 NOTE — Patient Instructions (Addendum)
Your provider has requested that you go to the basement level for lab work before leaving today. Press "B" on the elevator. The lab is located at the first door on the left as you exit the elevator.  Due to recent changes in healthcare laws, you may see the results of your imaging and laboratory studies on MyChart before your provider has had a chance to review them.  We understand that in some cases there may be results that are confusing or concerning to you. Not all laboratory results come back in the same time frame and the provider may be waiting for multiple results in order to interpret others.  Please give [place] 48 hours in order for your provider to thoroughly review all the results before contacting the office for clarification of your results.   We are giving you IBgard samples to try today.   Follow up with [place] in March. Appointment made for 10/31/2020 at 10:50AM   I appreciate the opportunity to care for you. [name], MD, [facility]

## 2020-09-27 NOTE — Progress Notes (Signed)
[name] 80 y.o. 01-21-1941 [id number]  Assessment & Plan:   Encounter Diagnoses  Name Primary?  . Chronic diarrhea Yes  . Full incontinence of feces     IBS has been my diagnosis the improvement on prednisone is a bit curious and raises the question of an inflammatory diarrhea process perhaps microscopic colitis.  I am going to screen for celiac disease.  Now is not a good time to perform colonoscopy as she is still struggling with some nausea and vomiting and vertigo and wants to get over that.  I will see her back in March and we will regroup.  She knows to contact me sooner if there are issues that need attention.  Another option would be to do a fecal calprotectin but I think a colonoscopy is reasonable in her given her overall health status.  Obviously if celiac testing is positive we would pursue a different route most likely.  I appreciate the opportunity to care for this patient. CC: [name], MD [name] PAC- ENT Subjective:   Chief Complaint: Diarrhea  HPI [name] is here with complaints of worsening diarrhea and fecal incontinence, she has a history of IBS diarrhea predominant.  When last seen in May 2021 she was doing quite well using as needed IBgard.  That still helps but she has had an increase in symptoms and problems.  She gets no warning and has urgent defecation that leads to incontinence.  Stools are almost always loose or watery what sounds like Bristol stool scale 6 or 7.  More recently she has had inner ear disturbance labyrinthitis with hearing loss on the right side and vertigo and nausea and vomiting and saw ENT.  [place] PA-C prescribed a high-dose prednisone taper starting at 60 mg a couple of weeks ago which is helping her hearing and vertigo issue somewhat though not resolved.  She notes that her diarrhea symptoms are much better since starting prednisone.  She has not had fecal incontinence since then though it has  only been a couple of weeks.  There is constant chronic soreness in the lower abdomen that is not really new.  Last colonoscopy 2013.  With 7 mm adenoma and diverticulosis and hemorrhoids.  Recall repeat not planned given age.  Wt Readings from Last 3 Encounters:  09/27/20 120 lb (54.4 kg)  07/25/20 119 lb 12.8 oz (54.3 kg)  03/09/20 117 lb 8 oz (53.3 kg)   Social history notable for "it has been another bad year" she lost another family member Allergies  Allergen Reactions  . Codeine Nausea Only   Current Meds  Medication Sig  . acetaminophen (TYLENOL) 325 MG tablet Take 650 mg by mouth every 6 (six) hours as needed for moderate pain.  [name] ALPRAZolam (XANAX) 0.25 MG tablet Take 1 tablet (0.25 mg total) by mouth daily as needed. for anxiety  . atorvastatin (LIPITOR) 20 MG tablet Please take 1 tablet (20mg ) by mouth twice a week.  . Calcium Carbonate-Vitamin D (CALCIUM + D PO) Take 1 tablet by mouth daily.  [name] diltiazem (CARDIZEM CD) 240 MG 24 hr capsule TAKE 1 CAPSULE BY MOUTH EVERY DAY  . dorzolamide-timolol (COSOPT) 22.3-6.8 MG/ML ophthalmic solution Place 1 drop into both eyes 2 (two) times daily.  [name] escitalopram (LEXAPRO) 20 MG tablet TAKE 1 TABLET BY MOUTH EVERY DAY  . hydrochlorothiazide (HYDRODIURIL) 25 MG tablet TAKE 1 TABLET BY MOUTH EVERY DAY  . meclizine (ANTIVERT) 25 MG tablet Take 1 tablet (25  mg total) by mouth 3 (three) times daily as needed for dizziness or nausea (vertigo).  . ondansetron (ZOFRAN) 4 MG tablet Take 1 tablet (4 mg total) by mouth every 8 (eight) hours as needed for nausea or vomiting.  . pantoprazole (PROTONIX) 20 MG tablet Take 1 tablet (20 mg total) by mouth daily.  . predniSONE (DELTASONE) 10 MG tablet PLEASE SEE ATTACHED FOR DETAILED DIRECTIONS  . valsartan (DIOVAN) 320 MG tablet TAKE 1 TABLET BY MOUTH EVERY DAY   Past Medical History:  Diagnosis Date  . Adenomatous colon polyp 2006  . Anxiety   . Arthritis   . Benign esophageal stricture 10/14/2012  .  CERUMEN IMPACTION, BILATERAL 12/13/2007  . CERVICAL RADICULOPATHY 11/05/2009  . Chronic kidney disease    kidney stones  . DEPRESSION 03/11/2007  . DIVERTICULOSIS, COLON 03/11/2007  . Esophagitis, reflux 10/14/2012  . GERD (gastroesophageal reflux disease) 10/14/2012  . Glaucoma   . 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 Hearing loss   . HEMORRHOIDS, EXTERNAL W/O COMPLICATION 5/73/2202   Qualifier: Diagnosis of  By: Jimmye Norman, LPN, Winfield Cunas   . HYPERLIPIDEMIA 09/22/2007  . HYPERTENSION 03/11/2007  . Kidney stone   . Osteopenia 02/2015   T score -1.4 FRAX 7.3%/0.6%  . Rectocele   . Rosacea 03/11/2007  . Tachycardia    Past Surgical History:  Procedure Laterality Date  . ANTERIOR CERVICAL DECOMP/DISCECTOMY FUSION  08/14/2011   Procedure: ANTERIOR CERVICAL DECOMPRESSION/DISCECTOMY FUSION 1 LEVEL/HARDWARE REMOVAL;  Surgeon: [name], MD;  Location: [facility];  Service: Neurosurgery;  Laterality: N/A;  Cervical Six-Seven anterior cervical decompression with fusion interbody prothesis plating and bonegraft with removal of hardware of Cervical Four to Cervical Six   . CERVICAL FUSION     C4-6  . COLONOSCOPY     multiple  . ESOPHAGOGASTRODUODENOSCOPY  10/14/2012   Dr. [name]  . KIDNEY SURGERY     age 35  . TONSILLECTOMY     Social History   Social History Narrative   Lives alone. Widowed. 2 living children (lost son to MI 2019 and daughter to pancreatic cancer 2020, another son MI 2021) and 7 grandchildren. 3 greatgrandkids      Retired from [employer] after 5 years and later took care of children.       Hobbies: time with grandkids, shopping, go out to eat. Travels once a year with girlfriends.    family history includes Cerebral palsy in her sister; Diabetes in her maternal grandmother; Glaucoma in her mother; Heart attack in her son and son; Heart disease in her father; Hypertension in her father; Lung cancer in her father; Mental illness in her mother; Pancreatic  cancer in her daughter; Stroke in her paternal aunt, paternal aunt, paternal uncle, paternal uncle, paternal uncle, and sister.   Review of Systems As per HPI  Objective:   Physical Exam BP 130/64   Pulse 68   Ht 5\' 1"  (1.549 m)   Wt 120 lb (54.4 kg)   LMP 08/12/1991   BMI 22.67 kg/m  Well-developed well-nourished petite white woman looking stated age Lungs clear Heart sounds are normal S1-S2 no rubs murmurs or gallops Abdomen is soft nontender no organomegaly mass bowel sounds are present

## 2020-09-28 LAB — TISSUE TRANSGLUTAMINASE, IGA: (tTG) Ab, IgA: <1 U/mL

## 2020-09-28 LAB — IGA: Immunoglobulin A: 121 mg/dL (ref 70–320)

## 2020-10-04 ENCOUNTER — Other Ambulatory Visit: Payer: Medicare Other

## 2020-10-19 ENCOUNTER — Telehealth: Payer: Medicare Other | Admitting: Family Medicine

## 2020-10-19 ENCOUNTER — Encounter: Payer: Self-pay | Admitting: Family Medicine

## 2020-10-19 DIAGNOSIS — J069 Acute upper respiratory infection, unspecified: Secondary | ICD-10-CM

## 2020-10-19 NOTE — Progress Notes (Signed)
   Subjective:    Patient ID: [name], female    DOB: 1940/08/25, 80 y.o.   MRN: [id number]  HPI My nurse and I called her home phone multiple times over a 30 minute period and it was always busy.    Review of Systems     Objective:   Physical Exam        Assessment & Plan:  We never spoke to her. Alysia Penna, MD

## 2020-10-22 ENCOUNTER — Encounter: Payer: Self-pay | Admitting: Family Medicine

## 2020-10-22 ENCOUNTER — Telehealth (INDEPENDENT_AMBULATORY_CARE_PROVIDER_SITE_OTHER): Payer: Medicare Other | Admitting: Family Medicine

## 2020-10-22 ENCOUNTER — Other Ambulatory Visit: Payer: Self-pay

## 2020-10-22 DIAGNOSIS — J329 Chronic sinusitis, unspecified: Secondary | ICD-10-CM

## 2020-10-22 NOTE — Progress Notes (Signed)
Phone [phone] Virtual visit via phonenote   Subjective:   Chief Complaint  [name] presents with  . Headache  . Cough  . Ears itching  . Fatigue  . NEGATIVE COVID    This visit type was conducted due to national recommendations for restrictions regarding the COVID-19 Pandemic (e.g. social distancing).  This format is felt to be most appropriate for this [name] at this time balancing risks to [name] and risks to population by having him in for in person visit.  All issues noted in this document were discussed and addressed.  No physical exam was performed (except for noted visual exam or audio findings with Telehealth visits).  The [name] has consented to conduct a Telehealth visit and understands insurance will be billed.   Our team/I connected with [name] at  2:40 PM EDT by phone ([name] did not have equipment for webex) and verified that I am speaking with the correct person using two identifiers.  Location [name]: [place] Location provider: [facility], office Persons participating in the virtual visit:  [name]  Time on phone: 12 minutes  Our team/I discussed the limitations of evaluation and management by telemedicine and the availability of in person appointments. In light of current covid-19 pandemic, [name] also understands that we are trying to protect them by minimizing in office contact if at all possible.  The [name] expressed consent for telemedicine visit and agreed to proceed. [name] understands insurance will be billed.   Past Medical History-  [name] Active Problem List   Diagnosis Date Noted  . Diabetes mellitus without complication ([place]) 32/99/2426    Priority: High  . History of supraventricular tachycardia 08/14/2011    Priority: High  . GERD (gastroesophageal reflux disease) 07/14/2014    Priority: Medium  . Primary open angle glaucoma of both eyes, mild stage 05/29/2014    Priority: Medium  . IBS (irritable bowel syndrome)- diarrhea  predominant 01/26/2013    Priority: Medium  . Glaucoma associated with ocular disorder 11/05/2009    Priority: Medium  . Hyperlipidemia associated with type 2 diabetes mellitus ([place]) 09/22/2007    Priority: Medium  . Major depression in full remission ([place])- Lexapro 20mg . Xanax once a week or less. 03/11/2007    Priority: Medium  . Hypertension associated with diabetes ([place]) 03/11/2007    Priority: Medium  . History of colonic polyps 11/28/2011    Priority: Low  . Cervical disc disorder with radiculopathy of cervical region 11/05/2009    Priority: Low  . Rosacea 03/11/2007    Priority: Low  . Vertigo 04/06/2020  . Sensorineural hearing loss (SNHL), bilateral 04/06/2020  . Aortic atherosclerosis ([place]) 07/21/2019  . Knee pain, left 11/02/2013  . Senile nuclear sclerosis 06/29/2011    Medications- reviewed and updated Current Outpatient Medications  Medication Sig Dispense Refill  . acetaminophen (TYLENOL) 325 MG tablet Take 650 mg by mouth every 6 (six) hours as needed for moderate pain.    [name] ALPRAZolam (XANAX) 0.25 MG tablet Take 1 tablet (0.25 mg total) by mouth daily as needed. for anxiety 30 tablet 1  . atorvastatin (LIPITOR) 20 MG tablet Please take 1 tablet (20mg ) by mouth twice a week. 26 tablet 3  . Calcium Carbonate-Vitamin D (CALCIUM + D PO) Take 1 tablet by mouth daily.    [name] diltiazem (CARDIZEM CD) 240 MG 24 hr capsule TAKE 1 CAPSULE BY MOUTH EVERY DAY 90 capsule 3  . dorzolamide-timolol (COSOPT) 22.3-6.8 MG/ML ophthalmic solution Place 1 drop into both eyes 2 (two)  times daily.    [name] escitalopram (LEXAPRO) 20 MG tablet TAKE 1 TABLET BY MOUTH EVERY DAY 90 tablet 1  . hydrochlorothiazide (HYDRODIURIL) 25 MG tablet TAKE 1 TABLET BY MOUTH EVERY DAY 90 tablet 3  . meclizine (ANTIVERT) 25 MG tablet Take 1 tablet (25 mg total) by mouth 3 (three) times daily as needed for dizziness or nausea (vertigo). 30 tablet 0  . pantoprazole (PROTONIX) 20 MG tablet Take 1 tablet (20 mg total)  by mouth daily. 90 tablet 3  . valsartan (DIOVAN) 320 MG tablet TAKE 1 TABLET BY MOUTH EVERY DAY 90 tablet 3  . ondansetron (ZOFRAN) 4 MG tablet Take 1 tablet (4 mg total) by mouth every 8 (eight) hours as needed for nausea or vomiting. ([name] not taking: Reported on 10/22/2020) 20 tablet 0   No current facility-administered medications for this visit.     Objective:  LMP 08/12/1991  self reported vitals  Nonlabored voice, normal speech      Assessment and Plan    # Cough/congestion nasal/sinus pressure S: [name] states for first 3 weeks of February had recurrent vertigo issues   And had to seen ENT several times- eventualyl placed on prednisone and symptoms resolved. No vertigo symptoms since that time.   Last Monday started with cough, ears itching, fatigue. She has a dry hacking cough. Got up yesterday morning thinking it could be covid and went to [facility] and did rapid test which was negative. A lot of sinus pressure. When clears throat gets green/yellow discharge and has PND- likely coming from sinuses  No shortness of breath. No fever  Mucinex some help. Does not want other medicines. Encouraged her to stay hydrated A/P: 80 year old female with cough/congestion nasal/sinus pressure for 8 days.  By COVID self-isolation criteria would be day 7 today.  I am concerned she has sinusitis-she does not yet meet criteria for bacterial sinusitis but we discussed if she fails to improve by Wednesday to give me a call that morning and we will send in a course of Augmentin for her.  She had a negative Covid rapid test but I still recommended PCR testing-she is going to consider this  [name] with symptoms concerning for potential covid 19 Therefore: - testing options discussed with [name] - recommended [name] watch closely for shortness of breath or confusion or worsening symptoms and if those occur he should contact us immediately  -recommended [name] consider purchasing pulse oximeter and  if levels 94% or below persistently- seek care at the hospital  -recommended self quarantine until negative PCR test  at minimum .  - for self isolation if covid 19 test positive would need to be at least 10 days since first symptom AND at least 24 hours fever free without fever reducing medications AND improvement in respiratory symptoms  - if positive would need to inform close contacts  Recommended follow up: as needed for acute symptoms or call me Wednesday if not improving Future Appointments  Date Time Provider [place]  10/31/2020 10:50 AM Gatha Mayer, MD [facility]  02/06/2021  9:40 AM Yong Channel Brayton Mars, MD [facility]   Lab/Order associations: No diagnosis found.  No orders of the defined types were placed in this encounter.   Return precautions advised.  Garret Reddish, MD

## 2020-10-22 NOTE — Patient Instructions (Signed)
Health Maintenance Due  Topic Date Due  . OPHTHALMOLOGY EXAM  Never done    Depression screen [facility] 2/9 07/25/2020 01/24/2020 07/21/2019  Decreased Interest 0 0 0  Down, Depressed, Hopeless 1 0 3  PHQ - 2 Score 1 0 3  Altered sleeping 1 0 0  Tired, decreased energy 1 1 1   Change in appetite 0 0 0  Feeling bad or failure about yourself  0 0 0  Trouble concentrating 0 0 0  Moving slowly or fidgety/restless 0 0 0  Suicidal thoughts 0 0 0  PHQ-9 Score 3 1 4   Difficult doing work/chores Not difficult at all Not difficult at all Not difficult at all    Recommended follow up: No follow-ups on file.

## 2020-10-24 ENCOUNTER — Telehealth: Payer: Self-pay

## 2020-10-24 MED ORDER — BENZONATATE 100 MG PO CAPS
100.0000 mg | ORAL_CAPSULE | Freq: Two times a day (BID) | ORAL | 0 refills | Status: DC | PRN
Start: 2020-10-24 — End: 2020-10-31

## 2020-10-24 MED ORDER — AMOXICILLIN-POT CLAVULANATE 875-125 MG PO TABS
1.0000 | ORAL_TABLET | Freq: Two times a day (BID) | ORAL | 0 refills | Status: DC
Start: 2020-10-24 — End: 2020-10-31

## 2020-10-24 NOTE — Telephone Encounter (Signed)
Spoke with patient. She is aware of the medicine that's been sent in

## 2020-10-24 NOTE — Telephone Encounter (Signed)
Please advise 

## 2020-10-24 NOTE — Telephone Encounter (Signed)
Pt states she is needing the cough medicine and antibiotic that Dr. Yong Channel mentioned calling in if pt wasn't feeling better.

## 2020-10-24 NOTE — Telephone Encounter (Signed)
I sent these in- please let patient know

## 2020-10-24 NOTE — Addendum Note (Signed)
Addended by: [name] on: 10/24/2020 01:56 PM   Modules accepted: Orders

## 2020-10-31 ENCOUNTER — Encounter: Payer: Self-pay | Admitting: Internal Medicine

## 2020-10-31 ENCOUNTER — Ambulatory Visit (INDEPENDENT_AMBULATORY_CARE_PROVIDER_SITE_OTHER): Payer: Medicare Other | Admitting: Internal Medicine

## 2020-10-31 VITALS — BP 120/70 | HR 79 | Ht 61.0 in | Wt 122.0 lb

## 2020-10-31 DIAGNOSIS — K529 Noninfective gastroenteritis and colitis, unspecified: Secondary | ICD-10-CM

## 2020-10-31 DIAGNOSIS — Z86010 Personal history of colonic polyps: Secondary | ICD-10-CM | POA: Diagnosis not present

## 2020-10-31 DIAGNOSIS — R159 Full incontinence of feces: Secondary | ICD-10-CM

## 2020-10-31 DIAGNOSIS — R109 Unspecified abdominal pain: Secondary | ICD-10-CM | POA: Diagnosis not present

## 2020-10-31 DIAGNOSIS — R1319 Other dysphagia: Secondary | ICD-10-CM

## 2020-10-31 MED ORDER — SUTAB 1479-225-188 MG PO TABS: 1.0000 | ORAL_TABLET | ORAL | 0 refills | Status: DC

## 2020-10-31 NOTE — Progress Notes (Signed)
[name] 80 y.o. 04/20/1941 [id number]  Assessment & Plan:   Encounter Diagnoses  Name Primary?  . Chronic diarrhea Yes  . Full incontinence of feces   . Recurrent abdominal pain   . History of colonic polyps   . Esophageal dysphagia     Colonoscopy for diarrhea evaluation question IBD given response to steroids or perhaps microscopic colitis.  Plan on random biopsies.  EGD with likely esophageal dilation regarding the dysphagia.  The risks and benefits as well as alternatives of endoscopic procedure(s) have been discussed and reviewed. All questions answered. The patient agrees to proceed.   I appreciate the opportunity to care for this patient. CC: [name], MD   Subjective:   Chief Complaint: Diarrhea and dysphagia  HPI  [name] is an 80 year old woman with a history of IBS who was seen in January with exacerbation of diarrhea and at that time she reported that steroid treatment for vertigo and inner ear disturbance had drastically improved her diarrhea.  She was suffering with a lot of urge incontinence issues.  She is better but still has urgency and close calls at times.  We had planned to do a colonoscopy when she was treated for her vertigo with success.  She feels better and ready to proceed.  There is also a history of colon polyps.  Last colonoscopy in 2013.  She also reports intermittent solid food dysphagia for several months episodic things like bread or meats.  There is a suprasternal sticking point and she waits and it passes.  She has not regurgitated.  She is not describing a lot of heartburn.  She is on pantoprazole 20 mg daily.  She requests tablets for her prep.   Allergies  Allergen Reactions  . Codeine Nausea Only   Current Meds  Medication Sig  . acetaminophen (TYLENOL) 325 MG tablet Take 650 mg by mouth every 6 (six) hours as needed for moderate pain.  [name] ALPRAZolam (XANAX) 0.25 MG tablet Take 1 tablet (0.25 mg total) by mouth  daily as needed. for anxiety  . atorvastatin (LIPITOR) 20 MG tablet Please take 1 tablet (20mg ) by mouth twice a week.  . Calcium Carbonate-Vitamin D (CALCIUM + D PO) Take 1 tablet by mouth daily.  [name] diltiazem (CARDIZEM CD) 240 MG 24 hr capsule TAKE 1 CAPSULE BY MOUTH EVERY DAY  . dorzolamide-timolol (COSOPT) 22.3-6.8 MG/ML ophthalmic solution Place 1 drop into both eyes 2 (two) times daily.  [name] escitalopram (LEXAPRO) 20 MG tablet TAKE 1 TABLET BY MOUTH EVERY DAY  . hydrochlorothiazide (HYDRODIURIL) 25 MG tablet TAKE 1 TABLET BY MOUTH EVERY DAY  . meclizine (ANTIVERT) 25 MG tablet Take 1 tablet (25 mg total) by mouth 3 (three) times daily as needed for dizziness or nausea (vertigo).  . ondansetron (ZOFRAN) 4 MG tablet Take 1 tablet (4 mg total) by mouth every 8 (eight) hours as needed for nausea or vomiting.  . pantoprazole (PROTONIX) 20 MG tablet Take 1 tablet (20 mg total) by mouth daily.  . valsartan (DIOVAN) 320 MG tablet TAKE 1 TABLET BY MOUTH EVERY DAY   Past Medical History:  Diagnosis Date  . Adenomatous colon polyp 2006  . Anxiety   . Arthritis   . Benign esophageal stricture 10/14/2012  . CERUMEN IMPACTION, BILATERAL 12/13/2007  . CERVICAL RADICULOPATHY 11/05/2009  . Chronic kidney disease    kidney stones  . DEPRESSION 03/11/2007  . DIVERTICULOSIS, COLON 03/11/2007  . Esophagitis, reflux 10/14/2012  . GERD (gastroesophageal reflux disease)  10/14/2012  . Glaucoma   . 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 Hearing loss   . HEMORRHOIDS, EXTERNAL W/O COMPLICATION 5/59/7416   Qualifier: Diagnosis of  By: Jimmye Norman, LPN, Winfield Cunas   . HYPERLIPIDEMIA 09/22/2007  . HYPERTENSION 03/11/2007  . Kidney stone   . Osteopenia 02/2015   T score -1.4 FRAX 7.3%/0.6%  . Rectocele   . Rosacea 03/11/2007  . Tachycardia    Past Surgical History:  Procedure Laterality Date  . ANTERIOR CERVICAL DECOMP/DISCECTOMY FUSION  08/14/2011   Procedure: ANTERIOR CERVICAL DECOMPRESSION/DISCECTOMY  FUSION 1 LEVEL/HARDWARE REMOVAL;  Surgeon: [name], MD;  Location: [facility];  Service: Neurosurgery;  Laterality: N/A;  Cervical Six-Seven anterior cervical decompression with fusion interbody prothesis plating and bonegraft with removal of hardware of Cervical Four to Cervical Six   . CERVICAL FUSION     C4-6  . COLONOSCOPY     multiple  . ESOPHAGOGASTRODUODENOSCOPY  10/14/2012   Dr. [name]  . KIDNEY SURGERY     age 52  . TONSILLECTOMY     Social History   Social History Narrative   Lives alone. Widowed. 2 living children (lost son to MI 2019 and daughter to pancreatic cancer 2020, another son MI 2021) and 7 grandchildren. 3 greatgrandkids      Retired from [employer] after 5 years and later took care of children.       Hobbies: time with grandkids, shopping, go out to eat. Travels once a year with girlfriends.    family history includes Cerebral palsy in her sister; Diabetes in her maternal grandmother; Glaucoma in her mother; Heart attack in her son and son; Heart disease in her father; Hypertension in her father; Lung cancer in her father; Mental illness in her mother; Pancreatic cancer in her daughter; Stroke in her paternal aunt, paternal aunt, paternal uncle, paternal uncle, paternal uncle, and sister.   Review of Systems As above  Objective:   Physical Exam BP 120/70   Pulse 79   Ht 5\' 1"  (1.549 m)   Wt 122 lb (55.3 kg)   LMP 08/12/1991   SpO2 98%   BMI 23.05 kg/m   Petite elderly white woman no acute distress Neck supple no mass Mildly tender epigastrium BS increased

## 2020-10-31 NOTE — Patient Instructions (Signed)
You have been scheduled for an endoscopy and colonoscopy. Please follow the written instructions given to you at your visit today. Please pick up your prep supplies at the pharmacy within the next 1-3 days. If you use inhalers (even only as needed), please bring them with you on the day of your procedure.  I appreciate the opportunity to care for you. Carl Gessner, MD, FACG 

## 2020-11-22 ENCOUNTER — Other Ambulatory Visit: Payer: Self-pay | Admitting: Family Medicine

## 2020-11-29 ENCOUNTER — Other Ambulatory Visit: Payer: Self-pay

## 2020-11-29 ENCOUNTER — Ambulatory Visit: Payer: Medicare Other

## 2020-11-29 DIAGNOSIS — R42 Dizziness and giddiness: Secondary | ICD-10-CM | POA: Insufficient documentation

## 2020-11-29 DIAGNOSIS — R262 Difficulty in walking, not elsewhere classified: Secondary | ICD-10-CM | POA: Insufficient documentation

## 2020-11-29 DIAGNOSIS — R2681 Unsteadiness on feet: Secondary | ICD-10-CM | POA: Insufficient documentation

## 2020-11-29 NOTE — Therapy (Signed)
[facility] [address] [place], [place], [address] Phone: [phone]   Fax:  [phone]  Physical Therapy Evaluation - No Charge  Patient Details  Name: [name] MRN: [id number] Date of Birth: [birth year] No data recorded  Encounter Date: 11/29/2020    Past Medical History:  Diagnosis Date  . Adenomatous colon polyp 2006  . Anxiety   . Arthritis   . Benign esophageal stricture 10/14/2012  . CERUMEN IMPACTION, BILATERAL 12/13/2007  . CERVICAL RADICULOPATHY 11/05/2009  . Chronic kidney disease    kidney stones  . DEPRESSION 03/11/2007  . DIVERTICULOSIS, COLON 03/11/2007  . Esophagitis, reflux 10/14/2012  . GERD (gastroesophageal reflux disease) 10/14/2012  . Glaucoma   . 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 Hearing loss   . HEMORRHOIDS, EXTERNAL W/O COMPLICATION 12/27/8414   Qualifier: Diagnosis of  By: Jimmye Norman, LPN, Winfield Cunas   . HYPERLIPIDEMIA 09/22/2007  . HYPERTENSION 03/11/2007  . Kidney stone   . Osteopenia 02/2015   T score -1.4 FRAX 7.3%/0.6%  . Rectocele   . Rosacea 03/11/2007  . Tachycardia     Past Surgical History:  Procedure Laterality Date  . ANTERIOR CERVICAL DECOMP/DISCECTOMY FUSION  08/14/2011   Procedure: ANTERIOR CERVICAL DECOMPRESSION/DISCECTOMY FUSION 1 LEVEL/HARDWARE REMOVAL;  Surgeon: [name], MD;  Location: [facility];  Service: Neurosurgery;  Laterality: N/A;  Cervical Six-Seven anterior cervical decompression with fusion interbody prothesis plating and bonegraft with removal of hardware of Cervical Four to Cervical Six   . CERVICAL FUSION     C4-6  . COLONOSCOPY     multiple  . ESOPHAGOGASTRODUODENOSCOPY  10/14/2012   Dr. [name]  . KIDNEY SURGERY     age 21  . TONSILLECTOMY      There were no vitals filed for this visit.    Subjective Assessment - 11/29/20 1222    Subjective Patient had episode of vertigo/vomitting in waiting room and had to be  moved to treatment room. Patient reports she started to feel the dizziness in the car, driving around try to find therapy buildin. Patient reports vertigo that started approx Jan - Feb 2021. Reports approx two episodes a week, typically last 24-36 hours. Patient reports she tries to sleep through them. Does report nauseous that occurs with the episodes. Patient reports she often feels imbalance. Not currently using AD. Patient reports that the episodes occur spontaneously. Denies sudden changes in sensation/weakness. Has hearing loss in R Ear. ENT placed her on predisone, which helped.  Denies spinning sensation laying down in bed. Patient reports hearing loss in R ear. Patient reports ear ache in the R ear, also reports intermittent aural fullness. Denies tinnitus.    Pertinent History achycardia, Osteopenia, HTN, HLD, Hearing Loss, GERD, Glaucoma, Depression, CKD, Cervical Radiculopathy, Anxiety, Arthritis    Currently in Pain? No/denies             Patient arrived to PT evaluation, with experiencing acute episode of vertigo and vomitting in waiting room prior to evaluation. Patient taken to private treatment room by front office assitant via w/c and moved to supine position with head elevated. Patient continue to experience vomiting intermittently. No concerning signs/symptoms of acute CVA or central origin of vertigo during assessment warranting emergency care. Vitals were as followed: BP: 138/72, HR: 72, Sp02: 98%. Patient felt some relief after extended rest break while completing subjective/history intake. PT tried to initiate evaluation with moving patient into sidelying > seated position, with patient beginning to  have episodes of vomiting again. PT educating on holding off from completion of evaluation today due to continuous vomiting, with patient verbalizing agreement and would also like to reschedule. Patient drove herself to evaluation today, PT called to have daughter pick up patient from  appointment due to continued severe dizziness/nauseous symptoms. No formal evaluation completed during today's visit, and rescheduled to later date.    Patient will benefit from skilled therapeutic intervention in order to improve the following deficits and impairments:     Visit Diagnosis: Dizziness and giddiness     Problem List/ Patient Active Problem List   Diagnosis Date Noted  . Vertigo 04/06/2020  . Sensorineural hearing loss (SNHL), bilateral 04/06/2020  . Aortic atherosclerosis ([place]) 07/21/2019  . Diabetes mellitus without complication ([place]) 37/29/0211  . GERD (gastroesophageal reflux disease) 07/14/2014  . Primary open angle glaucoma of both eyes, mild stage 05/29/2014  . Knee pain, left 11/02/2013  . IBS (irritable bowel syndrome)- diarrhea predominant 01/26/2013  . History of colonic polyps 11/28/2011  . History of supraventricular tachycardia 08/14/2011  . Senile nuclear sclerosis 06/29/2011  . Glaucoma associated with ocular disorder 11/05/2009  . Cervical disc disorder with radiculopathy of cervical region 11/05/2009  . Hyperlipidemia associated with type 2 diabetes mellitus ([place]) 09/22/2007  . Major depression in full remission ([place])- Lexapro 20mg . Xanax once a week or less. 03/11/2007  . Hypertension associated with diabetes ([place] [facility]) 03/11/2007  . Rosacea 03/11/2007    Jones Bales, PT, DPT 11/29/2020, 1:20 PM  [facility] [address] [place], [place], [address] Phone: [phone]   Fax:  [phone]  Name: JASHIRA COTUGNO MRN: [id number] Date of Birth: [birth year]

## 2020-12-03 ENCOUNTER — Ambulatory Visit: Payer: Medicare Other | Attending: Otolaryngology

## 2020-12-03 ENCOUNTER — Other Ambulatory Visit: Payer: Self-pay

## 2020-12-03 DIAGNOSIS — R262 Difficulty in walking, not elsewhere classified: Secondary | ICD-10-CM

## 2020-12-03 DIAGNOSIS — R42 Dizziness and giddiness: Secondary | ICD-10-CM | POA: Diagnosis present

## 2020-12-03 DIAGNOSIS — R2681 Unsteadiness on feet: Secondary | ICD-10-CM

## 2020-12-03 NOTE — Therapy (Signed)
[facility] [address] [place], [place], [address] Phone: [phone]   Fax:  [phone]  Physical Therapy Evaluation  Patient Details  Name: [name] MRN: [id number] Date of Birth: 02-16-1941 Referring Provider (PT): Melida Quitter, MD   Encounter Date: 12/03/2020   PT End of Session - 12/03/20 0937    Visit Number 1    Number of Visits 9    Date for PT Re-Evaluation 02/01/21   POC for 8 weeks, Cert for 60 days   Authorization Type Medicare, [employer]    Progress Note Due on Visit 10    PT Start Time 0931    PT Stop Time 1015    PT Time Calculation (min) 44 min    Activity Tolerance Patient tolerated treatment well    Behavior During Therapy [facility] for tasks assessed/performed           Past Medical History:  Diagnosis Date  . Adenomatous colon polyp 2006  . Anxiety   . Arthritis   . Benign esophageal stricture 10/14/2012  . CERUMEN IMPACTION, BILATERAL 12/13/2007  . CERVICAL RADICULOPATHY 11/05/2009  . Chronic kidney disease    kidney stones  . DEPRESSION 03/11/2007  . DIVERTICULOSIS, COLON 03/11/2007  . Esophagitis, reflux 10/14/2012  . GERD (gastroesophageal reflux disease) 10/14/2012  . Glaucoma   . 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 Hearing loss   . HEMORRHOIDS, EXTERNAL W/O COMPLICATION [id number]   Qualifier: Diagnosis of  By: Jimmye Norman, LPN, Winfield Cunas   . HYPERLIPIDEMIA 09/22/2007  . HYPERTENSION 03/11/2007  . Kidney stone   . Osteopenia 02/2015   T score -1.4 FRAX 7.3%/0.6%  . Rectocele   . Rosacea 03/11/2007  . Tachycardia     Past Surgical History:  Procedure Laterality Date  . ANTERIOR CERVICAL DECOMP/DISCECTOMY FUSION  08/14/2011   Procedure: ANTERIOR CERVICAL DECOMPRESSION/DISCECTOMY FUSION 1 LEVEL/HARDWARE REMOVAL;  Surgeon: [name], MD;  Location: [facility];  Service: Neurosurgery;  Laterality: N/A;  Cervical Six-Seven anterior cervical decompression with  fusion interbody prothesis plating and bonegraft with removal of hardware of Cervical Four to Cervical Six   . CERVICAL FUSION     C4-6  . COLONOSCOPY     multiple  . ESOPHAGOGASTRODUODENOSCOPY  10/14/2012   Dr. [name]  . KIDNEY SURGERY     age 26  . TONSILLECTOMY      There were no vitals filed for this visit.    Subjective Assessment - 12/03/20 0934    Subjective Patient reports vertigo that started approx Jan - Feb 2021. Reports approx two episodes a week, typically last 24-36 hours. Patient reports she tries to sleep through them. Does report nauseous that occurs with the episodes. Patient reports she often feels imbalance. Not currently using AD. Patient reports that the episodes occur spontaneously. Denies sudden changes in sensation/weakness. Has hearing loss in R Ear. ENT placed her on predisone, which helped.  Denies spinning sensation laying down in bed. Patient reports hearing loss in R ear. Patient reports ear ache in the R ear, also reports intermittent aural fullness.    Pertinent History achycardia, Osteopenia, HTN, HLD, Hearing Loss, GERD, Glaucoma, Depression, CKD, Cervical Radiculopathy, Anxiety, Arthritis    Patient Stated Goals Resolve Vertigo    Currently in Pain? No/denies              [facility] PT Assessment - 12/03/20 0001      Assessment   Medical Diagnosis Vertgio  Referring Provider (PT) Melida Quitter, MD    Hand Dominance Right    Next MD Visit 12/04/20      Precautions   Precautions Fall      Restrictions   Weight Bearing Restrictions No      Balance Screen   Has the patient fallen in the past 6 months No    Has the patient had a decrease in activity level because of a fear of falling?  No    Is the patient reluctant to leave their home because of a fear of falling?  No      Home Environment   Living Environment Private residence    Living Arrangements Alone    Available Help at Discharge Family    Type of [place] Access Level  entry    [place] None    Additional Comments No Difficulty getting in/out of house normally, increased furniture walking reported with vertigo spell      Prior Function   Level of Independence Independent      Cognition   Overall Cognitive Status Within Functional Limits for tasks assessed      Observation/Other Assessments   Focus on Therapeutic Outcomes (FOTO)  DFS: 50, DPS: 60.4      Sensation   Light Touch Appears Intact      ROM / Strength   AROM / PROM / Strength Strength      Strength   Overall Strength Within functional limits for tasks performed      Transfers   Transfers Sit to Stand;Stand to Sit    Sit to Stand 5: Supervision    Stand to Sit 5: Supervision      Ambulation/Gait   Ambulation/Gait Yes    Ambulation/Gait Assistance 5: Supervision;4: Min guard    Ambulation/Gait Assistance Details completed ambulation througohut therapy gym, at end of session increased unsteadiness on feet using wall for support at times.    Ambulation Distance (Feet) --   clinic distances   Assistive device None    Gait Pattern Step-through pattern;Narrow base of support    Ambulation Surface Level;Indoor      High Level Balance   High Level Balance Comments Completed M-CTSIB: able to complete situation 1: 30 seconds (increased postural sway), situation 2: 5 seconds, situation 3: 8 seconds. Increased postrual sway noted                  Vestibular Assessment - 12/03/20 0001      Symptom Behavior   Subjective history of current problem See Subjective    Type of Dizziness  Unsteady with head/body turns;Vertigo;Imbalance;Blurred vision    Frequency of Dizziness 1 - 2x/week    Duration of Dizziness 24-48 hours    Symptom Nature Motion provoked;Spontaneous    Aggravating Factors Spontaneous onset;Turning head quickly;Driving    Relieving Factors Closing eyes;Rest;Lying supine;Medication    Progression of Symptoms Better    History of  similar episodes None      Oculomotor Exam   Oculomotor Alignment Normal    Ocular ROM WNL    Spontaneous Absent    Gaze-induced  Absent    Smooth Pursuits Intact    Saccades Intact      Oculomotor Exam-Fixation Suppressed    Left Head Impulse Normal    Right Head Impulse Positive      Vestibulo-Ocular Reflex   VOR 1 Head Only (x 1 viewing) Increased challenge focusing on target.  VOR Cancellation Normal      Visual Acuity   Static 5    Dynamic 4      Positional Testing   Dix-Hallpike Dix-Hallpike Right;Dix-Hallpike Left    Horizontal Canal Testing Horizontal Canal Right;Horizontal Canal Left      Dix-Hallpike Right   Dix-Hallpike Right Duration 0    Dix-Hallpike Right Symptoms No nystagmus      Dix-Hallpike Left   Dix-Hallpike Left Duration 0    Dix-Hallpike Left Symptoms No nystagmus      Horizontal Canal Right   Horizontal Canal Right Duration 0    Horizontal Canal Right Symptoms Normal      Horizontal Canal Left   Horizontal Canal Left Duration 0    Horizontal Canal Left Symptoms Normal      Positional Sensitivities   Sit to Supine No dizziness    Supine to Left Side No dizziness    Supine to Right Side No dizziness    Supine to Sitting No dizziness    Right Hallpike No dizziness    Up from Right Hallpike Lightheadedness    Up from Left Hallpike Lightheadedness    Nose to Right Knee No dizziness   feel off balanced   Right Knee to Sitting Lightedness    Nose to Left Knee No dizziness   feel of balanced   Left Knee to Sitting Lightheadedness    Head Turning x 5 Mild dizziness   feel off balanced   Head Nodding x 5 Lightheadedness    Pivot Right in Standing No dizziness    Pivot Left in Standing No dizziness    Rolling Right No dizziness    Rolling Left No dizziness    Positional Sensitivities Comments report mild nauseous after completion of MSQ.              Objective measurements completed on examination: See above findings.                PT Education - 12/03/20 0937    Education Details Educated on POC/Evaluation Findings    Person(s) Educated Patient    Methods Explanation    Comprehension Verbalized understanding            PT Short Term Goals - 12/03/20 1214      PT SHORT TERM GOAL #1   Title Patient will be independent with initial vestibular/balance HEP (All STGs Due: 12/31/20)    Baseline no HEP established    Time 4    Period Weeks    Status New    Target Date 12/31/20      PT SHORT TERM GOAL #2   Title FGA to be assesesd and LTG to be set as appropriate    Baseline TBA    Time 4    Period Weeks    Status New      PT SHORT TERM GOAL #3   Title Patient will be able to hold situation 2 of M-CTSIB for >/= 15 seconds to demo improved vestibular input for balance    Baseline see flowsheet    Time 4    Period Weeks    Status New             PT Long Term Goals - 12/03/20 1215      PT LONG TERM GOAL #1   Title Patient will be independent with final vestibular/balance HEP (All LTGs Due: 01/28/21)    Baseline No HEP established    Time 8    Period Weeks  Status New    Target Date 01/28/21      PT LONG TERM GOAL #2   Title LTG to be set for FGA    Baseline TBA    Time 8    Period Weeks    Status New      PT LONG TERM GOAL #3   Title Patient will improve DFS >/= 55    Baseline DFS: 50, DPS: 60.4    Time 8    Period Weeks    Status New      PT LONG TERM GOAL #4   Title Patient will be able to hold situation 2 and 3 of M-CTSIB for >/= 25 seconds    Baseline see flowsheet    Time 8    Period Weeks    Status New                  Plan - 12/03/20 1210    Clinical Impression Statement Patient is an 80 y.o. female that was referred to Neuro OPPT services for Vertigo. Patient's PMH significant for the following: Tachycardia, Osteopenia, HTN, HLD, Hearing Loss, GERD, Glaucoma, Depression, CKD, Cervical Radiculopathy, Anxiety, Arthritis. ENT findings  indicated Acute Labyrinthitis affecting the R Ear. Upon evaluation patient presents with the following impairments: decreased acitivty tolerance, abnormal gait, decreased balance, dizziness, positive HIT on R, and increased risk for falls. Patient demonstrating significant imbalance on M-CTSIB today, specifically with vision removed demonstrating impaired vestibular input. Patient will benefit from skilled PT services to address impairments and reduce fall risk.    Personal Factors and Comorbidities Age;Time since onset of injury/illness/exacerbation;Comorbidity 3+    Comorbidities Tachycardia, Osteopenia, HTN, HLD, Hearing Loss, GERD, Glaucoma, Depression, CKD, Cervical Radiculopathy, Anxiety, Arthritis    Examination-Activity Limitations Transfers;Locomotion Level;Bend;Stand    Examination-Participation Restrictions Driving;Community Activity    Stability/Clinical Decision Making Evolving/Moderate complexity    Clinical Decision Making Moderate    Rehab Potential Fair    PT Frequency 1x / week    PT Duration 8 weeks    PT Treatment/Interventions ADLs/Self Care Home Management;Canalith Repostioning;Cryotherapy;Moist Heat;DME Instruction;Gait training;Stair training;Patient/family education;Functional mobility training;Therapeutic activities;Therapeutic exercise;Balance training;Neuromuscular re-education;Manual techniques;Passive range of motion;Dry needling;Vestibular    PT Next Visit Plan Assess FGA and Update LTG. Begin Corner Balance HEP, VOR x 1.    Consulted and Agree with Plan of Care Patient           Patient will benefit from skilled therapeutic intervention in order to improve the following deficits and impairments:  Abnormal gait,Difficulty walking,Dizziness,Decreased activity tolerance,Decreased balance  Visit Diagnosis: Dizziness and giddiness  Unsteadiness on feet  Difficulty in walking, not elsewhere classified     Problem List Patient Active Problem List   Diagnosis  Date Noted  . Vertigo 04/06/2020  . Sensorineural hearing loss (SNHL), bilateral 04/06/2020  . Aortic atherosclerosis ([place]) 07/21/2019  . Diabetes mellitus without complication ([place]) [id number]  . GERD (gastroesophageal reflux disease) 07/14/2014  . Primary open angle glaucoma of both eyes, mild stage 05/29/2014  . Knee pain, left 11/02/2013  . IBS (irritable bowel syndrome)- diarrhea predominant 01/26/2013  . History of colonic polyps 11/28/2011  . History of supraventricular tachycardia 08/14/2011  . Senile nuclear sclerosis 06/29/2011  . Glaucoma associated with ocular disorder 11/05/2009  . Cervical disc disorder with radiculopathy of cervical region 11/05/2009  . Hyperlipidemia associated with type 2 diabetes mellitus ([place]) 09/22/2007  . Major depression in full remission ([place])- Lexapro 20mg . Xanax once a week or less. 03/11/2007  . Hypertension associated with  diabetes ([place]) 03/11/2007  . Rosacea 03/11/2007    Jones Bales, PT, DPT 12/03/2020, 12:18 PM  [facility] [address] [place], [place], [address] Phone: [phone]   Fax:  [phone]  Name: [name] MRN: [id number] Date of Birth: May 31, 1941

## 2020-12-06 ENCOUNTER — Other Ambulatory Visit: Payer: Self-pay

## 2020-12-12 ENCOUNTER — Ambulatory Visit: Payer: Medicare Other | Attending: Physician Assistant

## 2020-12-12 ENCOUNTER — Other Ambulatory Visit: Payer: Self-pay

## 2020-12-12 DIAGNOSIS — R42 Dizziness and giddiness: Secondary | ICD-10-CM | POA: Diagnosis not present

## 2020-12-12 DIAGNOSIS — R2681 Unsteadiness on feet: Secondary | ICD-10-CM | POA: Diagnosis present

## 2020-12-12 DIAGNOSIS — R262 Difficulty in walking, not elsewhere classified: Secondary | ICD-10-CM

## 2020-12-12 NOTE — Therapy (Signed)
[facility] [address] [place], [place], [address] Phone: [phone]   Fax:  [phone]  Physical Therapy Treatment  Patient Details  Name: [name] MRN: [id number] Date of Birth: 08-28-1940 Referring Provider (PT): Melida Quitter, MD   Encounter Date: 12/12/2020   PT End of Session - 12/12/20 1103    Visit Number 2    Number of Visits 9    Date for PT Re-Evaluation 02/01/21   POC for 8 weeks, Cert for 60 days   Authorization Type Medicare, [employer]    Progress Note Due on Visit 10    PT Start Time 1102    PT Stop Time 1145    PT Time Calculation (min) 43 min    Activity Tolerance Patient tolerated treatment well    Behavior During Therapy [facility] for tasks assessed/performed           Past Medical History:  Diagnosis Date  . Adenomatous colon polyp 2006  . Anxiety   . Arthritis   . Benign esophageal stricture 10/14/2012  . CERUMEN IMPACTION, BILATERAL 12/13/2007  . CERVICAL RADICULOPATHY 11/05/2009  . Chronic kidney disease    kidney stones  . DEPRESSION 03/11/2007  . DIVERTICULOSIS, COLON 03/11/2007  . Esophagitis, reflux 10/14/2012  . GERD (gastroesophageal reflux disease) 10/14/2012  . Glaucoma   . 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 Hearing loss   . HEMORRHOIDS, EXTERNAL W/O COMPLICATION [id number]   Qualifier: Diagnosis of  By: Jimmye Norman, LPN, Winfield Cunas   . HYPERLIPIDEMIA 09/22/2007  . HYPERTENSION 03/11/2007  . Kidney stone   . Osteopenia 02/2015   T score -1.4 FRAX 7.3%/0.6%  . Rectocele   . Rosacea 03/11/2007  . Tachycardia     Past Surgical History:  Procedure Laterality Date  . ANTERIOR CERVICAL DECOMP/DISCECTOMY FUSION  08/14/2011   Procedure: ANTERIOR CERVICAL DECOMPRESSION/DISCECTOMY FUSION 1 LEVEL/HARDWARE REMOVAL;  Surgeon: [name], MD;  Location: [facility];  Service: Neurosurgery;  Laterality: N/A;  Cervical Six-Seven anterior cervical decompression with  fusion interbody prothesis plating and bonegraft with removal of hardware of Cervical Four to Cervical Six   . CERVICAL FUSION     C4-6  . COLONOSCOPY     multiple  . ESOPHAGOGASTRODUODENOSCOPY  10/14/2012   Dr. [name]  . KIDNEY SURGERY     age 89  . TONSILLECTOMY      There were no vitals filed for this visit.   Subjective Assessment - 12/12/20 1105    Subjective Patient reports did have some residual dizziness after the evaluation, but resolved after about a day. No dizziness since then. ENT has diagnosed her with Meniere's Disease. No falls or other new changes.    Pertinent History achycardia, Osteopenia, HTN, HLD, Hearing Loss, GERD, Glaucoma, Depression, CKD, Cervical Radiculopathy, Anxiety, Arthritis    Patient Stated Goals Resolve Vertigo    Currently in Pain? No/denies              [facility] PT Assessment - 12/12/20 0001      Functional Gait  Assessment   Gait assessed  Yes    Gait Level Surface Walks 20 ft in less than 5.5 sec, no assistive devices, good speed, no evidence for imbalance, normal gait pattern, deviates no more than 6 in outside of the 12 in walkway width.    Change in Gait Speed Able to smoothly change walking speed without loss of balance or gait deviation. Deviate no more than 6 in outside  of the 12 in walkway width.    Gait with Horizontal Head Turns Performs head turns smoothly with slight change in gait velocity (eg, minor disruption to smooth gait path), deviates 6-10 in outside 12 in walkway width, or uses an assistive device.    Gait with Vertical Head Turns Performs task with slight change in gait velocity (eg, minor disruption to smooth gait path), deviates 6 - 10 in outside 12 in walkway width or uses assistive device    Gait and Pivot Turn Pivot turns safely within 3 sec and stops quickly with no loss of balance.    Step Over Obstacle Is able to step over one shoe box (4.5 in total height) without changing gait speed. No evidence of imbalance.     Gait with Narrow Base of Support Ambulates less than 4 steps heel to toe or cannot perform without assistance.    Gait with Eyes Closed Walks 20 ft, uses assistive device, slower speed, mild gait deviations, deviates 6-10 in outside 12 in walkway width. Ambulates 20 ft in less than 9 sec but greater than 7 sec.    Ambulating Backwards Walks 20 ft, uses assistive device, slower speed, mild gait deviations, deviates 6-10 in outside 12 in walkway width.    Steps Alternating feet, must use rail.    Total Score 21    FGA comment: 21/30               [facility] Adult PT Treatment/Exercise - 12/12/20 0001      Ambulation/Gait   Ambulation/Gait Yes    Ambulation/Gait Assistance 5: Supervision    Ambulation/Gait Assistance Details throughout therapy gym with activities    Assistive device None    Gait Pattern Step-through pattern;Narrow base of support    Ambulation Surface Level;Indoor           Vestibular Treatment/Exercise - 12/12/20 0001      Vestibular Treatment/Exercise   Vestibular Treatment Provided Gaze    Gaze Exercises X1 Viewing Horizontal;X1 Viewing Vertical      X1 Viewing Horizontal   Foot Position seated with back support    Time --   30 secs   Reps 2    Comments x 30 seconds; mild blurry vision. educating on technique/range for proper completion.      X1 Viewing Vertical   Foot Position seated with back support    Time --   30 secs   Reps 2    Comments x 30 seconds; mild blurry vision. educating on technique/range for proper completion.              Balance Exercises - 12/12/20 0001      Balance Exercises: Standing   Standing Eyes Opened Narrow base of support (BOS);Head turns;Foam/compliant surface;Limitations    Standing Eyes Opened Limitations completed horizontal/vertical head turns 2 x 10 reps with feet together on pillows.    Standing Eyes Closed Wide (BOA);Foam/compliant surface;3 reps;30 secs;Limitations    Standing Eyes Closed Limitations  completed bilateral stance with eyes closed 3 x 30    Tandem Stance Eyes open;Intermittent upper extremity support;2 reps;Limitations    Tandem Stance Time 15-20 seconds; increased challenge with RLE posterior.             PT Education - 12/12/20 1144    Education Details Educated on Initial Vestibular/Balance HEP    Person(s) Educated Patient    Methods Explanation;Demonstration;Handout    Comprehension Verbalized understanding;Returned demonstration  PT Short Term Goals - 12/12/20 1203      PT SHORT TERM GOAL #1   Title Patient will be independent with initial vestibular/balance HEP (All STGs Due: 12/31/20)    Baseline no HEP established    Time 4    Period Weeks    Status New    Target Date 12/31/20      PT SHORT TERM GOAL #2   Title FGA to be assesesd and LTG to be set as appropriate    Baseline 21/30    Time 4    Period Weeks    Status Achieved      PT SHORT TERM GOAL #3   Title Patient will be able to hold situation 2 of M-CTSIB for >/= 15 seconds to demo improved vestibular input for balance    Baseline see flowsheet    Time 4    Period Weeks    Status New             PT Long Term Goals - 12/12/20 1203      PT LONG TERM GOAL #1   Title Patient will be independent with final vestibular/balance HEP (All LTGs Due: 01/28/21)    Baseline No HEP established    Time 8    Period Weeks    Status New      PT LONG TERM GOAL #2   Title Patient will improve FGA to >/= 25/30 to demo improved balance and reduced fall risk    Baseline 21/30    Time 8    Period Weeks    Status Revised      PT LONG TERM GOAL #3   Title Patient will improve DFS >/= 55    Baseline DFS: 50, DPS: 60.4    Time 8    Period Weeks    Status New      PT LONG TERM GOAL #4   Title Patient will be able to hold situation 2 and 3 of M-CTSIB for >/= 25 seconds    Baseline see flowsheet    Time 8    Period Weeks    Status New                 Plan - 12/12/20 1201     Clinical Impression Statement Further assesed balance today with FGA, patient scoring 21/30 indicating medium fall risk. Rest of session spent establishing initial HEP focused on corner balance and gaze stabilization. Patient tolerating VOR x 1 seated well. increased postural sway noted with vision removed. WIll continue to progress toward all LTGs.    Personal Factors and Comorbidities Age;Time since onset of injury/illness/exacerbation;Comorbidity 3+    Comorbidities Tachycardia, Osteopenia, HTN, HLD, Hearing Loss, GERD, Glaucoma, Depression, CKD, Cervical Radiculopathy, Anxiety, Arthritis    Examination-Activity Limitations Transfers;Locomotion Level;Bend;Stand    Examination-Participation Restrictions Driving;Community Activity    Stability/Clinical Decision Making Evolving/Moderate complexity    Rehab Potential Fair    PT Frequency 1x / week    PT Duration 8 weeks    PT Treatment/Interventions ADLs/Self Care Home Management;Canalith Repostioning;Cryotherapy;Moist Heat;DME Instruction;Gait training;Stair training;Patient/family education;Functional mobility training;Therapeutic activities;Therapeutic exercise;Balance training;Neuromuscular re-education;Manual techniques;Passive range of motion;Dry needling;Vestibular    PT Next Visit Plan How was HEP? Continue Corner Balance HEP, VOR x 1 progression. High level Balance.    Consulted and Agree with Plan of Care Patient           Patient will benefit from skilled therapeutic intervention in order to improve the following deficits and impairments:  Abnormal  gait,Difficulty walking,Dizziness,Decreased activity tolerance,Decreased balance  Visit Diagnosis: Dizziness and giddiness  Unsteadiness on feet  Difficulty in walking, not elsewhere classified     Problem List Patient Active Problem List   Diagnosis Date Noted  . Vertigo 04/06/2020  . Sensorineural hearing loss (SNHL), bilateral 04/06/2020  . Aortic atherosclerosis ([place])  07/21/2019  . Diabetes mellitus without complication ([place]) 43/32/9518  . GERD (gastroesophageal reflux disease) 07/14/2014  . Primary open angle glaucoma of both eyes, mild stage 05/29/2014  . Knee pain, left 11/02/2013  . IBS (irritable bowel syndrome)- diarrhea predominant 01/26/2013  . History of colonic polyps 11/28/2011  . History of supraventricular tachycardia 08/14/2011  . Senile nuclear sclerosis 06/29/2011  . Glaucoma associated with ocular disorder 11/05/2009  . Cervical disc disorder with radiculopathy of cervical region 11/05/2009  . Hyperlipidemia associated with type 2 diabetes mellitus ([place]) 09/22/2007  . Major depression in full remission ([place])- Lexapro 20mg . Xanax once a week or less. 03/11/2007  . Hypertension associated with diabetes ([place]) 03/11/2007  . Rosacea 03/11/2007    Jones Bales, PT, DPT 12/12/2020, 12:04 PM  [facility] [address] [place], [place], [address] Phone: [phone]   Fax:  [phone]  Name: [name] MRN: [id number] Date of Birth: August 12, 1940

## 2020-12-12 NOTE — Patient Instructions (Addendum)
Gaze Stabilization: Sitting    Keeping eyes on target on wall 3-4 feet away, tilt head down 15-30 and move head side to side for 30 seconds. Repeat while moving head up and down for 30 seconds. Do 2 sessions per day. Repeat using target on pattern background.    Gaze Stabilization: Tip Card  1.Target must remain in focus, not blurry, and appear stationary while head is in motion. 2.Perform exercises with small head movements (45 to either side of midline). 3.Increase speed of head motion so long as target is in focus. 4.If you wear eyeglasses, be sure you can see target through lens (therapist will give specific instructions for bifocal / progressive lenses). 5.These exercises may provoke dizziness or nausea. Work through these symptoms. If too dizzy, slow head movement slightly. Rest between each exercise. 6.Exercises demand concentration; avoid distractions. 7.For safety, perform standing exercises close to a counter, wall, corner, or next to someone.  Copyright  VHI. All rights reserved.    Access Code: [email or web address] URL: [email or web address] Date: 12/12/2020 Prepared by: Baldomero Lamy  Exercises Tandem Stance - 1 x daily - 5 x weekly - 1 sets - 2 reps - 15-20 seconds hold Romberg Stance on Foam Pad with Head Rotation - 1 x daily - 5 x weekly - 2 sets - 10 reps Romberg Stance with Head Nods on Foam Pad - 1 x daily - 5 x weekly - 2 sets - 10 reps Standing Balance with Eyes Closed on Foam - 1 x daily - 5 x weekly - 1 sets - 3 reps - 30 hold

## 2020-12-19 ENCOUNTER — Ambulatory Visit: Payer: Medicare Other

## 2020-12-19 ENCOUNTER — Other Ambulatory Visit: Payer: Self-pay

## 2020-12-19 DIAGNOSIS — R262 Difficulty in walking, not elsewhere classified: Secondary | ICD-10-CM

## 2020-12-19 DIAGNOSIS — R42 Dizziness and giddiness: Secondary | ICD-10-CM | POA: Diagnosis not present

## 2020-12-19 DIAGNOSIS — R2681 Unsteadiness on feet: Secondary | ICD-10-CM

## 2020-12-19 NOTE — Patient Instructions (Signed)
Access Code: [email or web address] URL: [email or web address] Date: 12/19/2020 Prepared by: Baldomero Lamy  Exercises Tandem Stance - 1 x daily - 5 x weekly - 1 sets - 2 reps - 15-20 seconds hold Standing Balance with Eyes Closed on Foam - 1 x daily - 5 x weekly - 1 sets - 3 reps - 30 hold Wide Stance with Head Rotation on Foam Pad - 1 x daily - 5 x weekly - 2 sets - 10 reps Wide Stance with Head Nods on Foam Pad - 1 x daily - 5 x weekly - 2 sets - 10 reps Walking with Head Rotation - 1 x daily - 7 x weekly - 1 sets - 3 reps

## 2020-12-19 NOTE — Therapy (Signed)
[facility] [address] [place], [place], [address] Phone: [phone]   Fax:  [phone]  Physical Therapy Treatment  Patient Details  Name: [name] MRN: [id number] Date of Birth: July 22, 1941 Referring Provider (PT): Melida Quitter, MD   Encounter Date: 12/19/2020   PT End of Session - 12/19/20 1108    Visit Number 3    Number of Visits 9    Date for PT Re-Evaluation 02/01/21   POC for 8 weeks, Cert for 60 days   Authorization Type Medicare, [employer]    Progress Note Due on Visit 10    PT Start Time 1104    PT Stop Time 1144    PT Time Calculation (min) 40 min    Equipment Utilized During Treatment Gait belt    Activity Tolerance Patient tolerated treatment well    Behavior During Therapy [facility] for tasks assessed/performed           Past Medical History:  Diagnosis Date  . Adenomatous colon polyp 2006  . Anxiety   . Arthritis   . Benign esophageal stricture 10/14/2012  . CERUMEN IMPACTION, BILATERAL 12/13/2007  . CERVICAL RADICULOPATHY 11/05/2009  . Chronic kidney disease    kidney stones  . DEPRESSION 03/11/2007  . DIVERTICULOSIS, COLON 03/11/2007  . Esophagitis, reflux 10/14/2012  . GERD (gastroesophageal reflux disease) 10/14/2012  . Glaucoma   . 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 Hearing loss   . HEMORRHOIDS, EXTERNAL W/O COMPLICATION 2/35/5732   Qualifier: Diagnosis of  By: Jimmye Norman, LPN, Winfield Cunas   . HYPERLIPIDEMIA 09/22/2007  . HYPERTENSION 03/11/2007  . Kidney stone   . Osteopenia 02/2015   T score -1.4 FRAX 7.3%/0.6%  . Rectocele   . Rosacea 03/11/2007  . Tachycardia     Past Surgical History:  Procedure Laterality Date  . ANTERIOR CERVICAL DECOMP/DISCECTOMY FUSION  08/14/2011   Procedure: ANTERIOR CERVICAL DECOMPRESSION/DISCECTOMY FUSION 1 LEVEL/HARDWARE REMOVAL;  Surgeon: [name], MD;  Location: [facility];  Service: Neurosurgery;  Laterality: N/A;   Cervical Six-Seven anterior cervical decompression with fusion interbody prothesis plating and bonegraft with removal of hardware of Cervical Four to Cervical Six   . CERVICAL FUSION     C4-6  . COLONOSCOPY     multiple  . ESOPHAGOGASTRODUODENOSCOPY  10/14/2012   Dr. [name]  . KIDNEY SURGERY     age 62  . TONSILLECTOMY      There were no vitals filed for this visit.   Subjective Assessment - 12/19/20 1107    Subjective Patient reports no new vertigo attacks. Patient reports that she has began walking approx 2-3 miles. Has not completed exercises well, would like to review. No pain. No falls.    Pertinent History achycardia, Osteopenia, HTN, HLD, Hearing Loss, GERD, Glaucoma, Depression, CKD, Cervical Radiculopathy, Anxiety, Arthritis    Patient Stated Goals Resolve Vertigo    Currently in Pain? No/denies              [facility] Adult PT Treatment/Exercise - 12/19/20 0001      Ambulation/Gait   Ambulation/Gait Yes    Ambulation/Gait Assistance 5: Supervision    Ambulation/Gait Assistance Details throughout session with activities    Ambulation Distance (Feet) --   clinic distance   Assistive device None    Gait Pattern Step-through pattern;Narrow base of support    Ambulation Surface Level;Indoor      High Level Balance   High Level Balance Activities Head turns;Other (  comment)   Gait with Eyes Closed   High Level Balance Comments Completed ambulation with horizontal/vertical head turns x 2 laps each, increased veering with horizontal > vertical. No dizziness. Completed gait with eyes closed, often veering to L side during session. PT continued education on use of night light due to balance hcallenge with vision removed.           Vestibular Treatment/Exercise - 12/19/20 0001      Vestibular Treatment/Exercise   Vestibular Treatment Provided Gaze    Gaze Exercises X1 Viewing Horizontal;X1 Viewing Vertical      X1 Viewing Horizontal   Foot Position seated with back  support    Reps 2    Comments progressed from 30 secs > 60 secons, minimal dizziness      X1 Viewing Vertical   Foot Position seated with back support    Reps 2    Comments progressed from 30 secs > 60 secons,  no dizziness.              Balance Exercises - 12/19/20 0001      Balance Exercises: Standing   Standing Eyes Opened Narrow base of support (BOS);Head turns;Foam/compliant surface;Limitations;Wide (BOA)    Standing Eyes Opened Limitations completed horizontal/vertical head turns 2 x 10 reps with feet together on airex. patient reports increased challenge on pillow surface at home, and PT educating to go to feet hip width apart    Standing Eyes Closed Wide (BOA);Narrow base of support (BOS);Foam/compliant surface;3 reps;30 secs;Limitations    Standing Eyes Closed Limitations standing on with wide BOS completed eys clsoed x 30 seconds; then for further challenge progressed to narrow BOS 2 x 30 seconds    Tandem Gait Forward;Intermittent upper extremity support;4 reps;Limitations    Tandem Gait Limitations in // bars completed tandem gait working toward reduced UE support and control, completed x 2 laps with BUE support then progressed to single UE support. Patient demo improvements.             Access Code: [email or web address] URL: [email or web address] Date: 12/19/2020 Prepared by: Baldomero Lamy  Exercises Tandem Stance - 1 x daily - 5 x weekly - 1 sets - 2 reps - 15-20 seconds hold Standing Balance with Eyes Closed on Foam - 1 x daily - 5 x weekly - 1 sets - 3 reps - 30 hold Wide Stance with Head Rotation on Foam Pad - 1 x daily - 5 x weekly - 2 sets - 10 reps Wide Stance with Head Nods on Foam Pad - 1 x daily - 5 x weekly - 2 sets - 10 reps Walking with Head Rotation - 1 x daily - 7 x weekly - 1 sets - 3 reps    PT Education - 12/19/20 1151    Education Details HEP Update (Progress VOR x 1 to 60 seconds)    Person(s) Educated Patient    Methods  Explanation;Demonstration;Handout    Comprehension Verbalized understanding;Returned demonstration            PT Short Term Goals - 12/12/20 1203      PT SHORT TERM GOAL #1   Title Patient will be independent with initial vestibular/balance HEP (All STGs Due: 12/31/20)    Baseline no HEP established    Time 4    Period Weeks    Status New    Target Date 12/31/20      PT SHORT TERM GOAL #2   Title FGA to be assesesd and LTG to be set as  appropriate    Baseline 21/30    Time 4    Period Weeks    Status Achieved      PT SHORT TERM GOAL #3   Title Patient will be able to hold situation 2 of M-CTSIB for >/= 15 seconds to demo improved vestibular input for balance    Baseline see flowsheet    Time 4    Period Weeks    Status New             PT Long Term Goals - 12/12/20 1203      PT LONG TERM GOAL #1   Title Patient will be independent with final vestibular/balance HEP (All LTGs Due: 01/28/21)    Baseline No HEP established    Time 8    Period Weeks    Status New      PT LONG TERM GOAL #2   Title Patient will improve FGA to >/= 25/30 to demo improved balance and reduced fall risk    Baseline 21/30    Time 8    Period Weeks    Status Revised      PT LONG TERM GOAL #3   Title Patient will improve DFS >/= 55    Baseline DFS: 50, DPS: 60.4    Time 8    Period Weeks    Status New      PT LONG TERM GOAL #4   Title Patient will be able to hold situation 2 and 3 of M-CTSIB for >/= 25 seconds    Baseline see flowsheet    Time 8    Period Weeks    Status New               Plan - 12/19/20 1208    Clinical Impression Statement Completed review of HEP, and progressed VOR to 60 seconds with patient tolerating well. Continued high level balance on complaint surfaces. Added horizontal head turns with ambulation to HEP. Continue to demo increased challenge with eyes closed. Will continue to progress toward all LTGs.    Personal Factors and Comorbidities Age;Time  since onset of injury/illness/exacerbation;Comorbidity 3+    Comorbidities Tachycardia, Osteopenia, HTN, HLD, Hearing Loss, GERD, Glaucoma, Depression, CKD, Cervical Radiculopathy, Anxiety, Arthritis    Examination-Activity Limitations Transfers;Locomotion Level;Bend;Stand    Examination-Participation Restrictions Driving;Community Activity    Stability/Clinical Decision Making Evolving/Moderate complexity    Rehab Potential Fair    PT Frequency 1x / week    PT Duration 8 weeks    PT Treatment/Interventions ADLs/Self Care Home Management;Canalith Repostioning;Cryotherapy;Moist Heat;DME Instruction;Gait training;Stair training;Patient/family education;Functional mobility training;Therapeutic activities;Therapeutic exercise;Balance training;Neuromuscular re-education;Manual techniques;Passive range of motion;Dry needling;Vestibular    PT Next Visit Plan How was HEP? Continue Corner Balance HEP, VOR x 1 progression. High level Balance.    Consulted and Agree with Plan of Care Patient           Patient will benefit from skilled therapeutic intervention in order to improve the following deficits and impairments:  Abnormal gait,Difficulty walking,Dizziness,Decreased activity tolerance,Decreased balance  Visit Diagnosis: Dizziness and giddiness  Unsteadiness on feet  Difficulty in walking, not elsewhere classified     Problem List Patient Active Problem List   Diagnosis Date Noted  . Vertigo 04/06/2020  . Sensorineural hearing loss (SNHL), bilateral 04/06/2020  . Aortic atherosclerosis ([place]) 07/21/2019  . Diabetes mellitus without complication ([place]) 40/98/1191  . GERD (gastroesophageal reflux disease) 07/14/2014  . Primary open angle glaucoma of both eyes, mild stage 05/29/2014  . Knee pain, left 11/02/2013  .  IBS (irritable bowel syndrome)- diarrhea predominant 01/26/2013  . History of colonic polyps 11/28/2011  . History of supraventricular tachycardia 08/14/2011  . Senile nuclear  sclerosis 06/29/2011  . Glaucoma associated with ocular disorder 11/05/2009  . Cervical disc disorder with radiculopathy of cervical region 11/05/2009  . Hyperlipidemia associated with type 2 diabetes mellitus ([place]) 09/22/2007  . Major depression in full remission ([place])- Lexapro 20mg . Xanax once a week or less. 03/11/2007  . Hypertension associated with diabetes ([place]) 03/11/2007  . Rosacea 03/11/2007    Jones Bales, PT, DPT 12/19/2020, 12:15 PM  [facility] [address] [place], [place], [address] Phone: [phone]   Fax:  [phone]  Name: [name] MRN: [id number] Date of Birth: Sep 21, 1940

## 2020-12-31 ENCOUNTER — Ambulatory Visit: Payer: Medicare Other

## 2020-12-31 ENCOUNTER — Other Ambulatory Visit: Payer: Self-pay

## 2020-12-31 DIAGNOSIS — R262 Difficulty in walking, not elsewhere classified: Secondary | ICD-10-CM

## 2020-12-31 DIAGNOSIS — R42 Dizziness and giddiness: Secondary | ICD-10-CM

## 2020-12-31 DIAGNOSIS — R2681 Unsteadiness on feet: Secondary | ICD-10-CM

## 2020-12-31 NOTE — Therapy (Signed)
[facility] [address] [place], [place], [address] Phone: [phone]   Fax:  [phone]  Physical Therapy Treatment  Patient Details  Name: [name] MRN: [id number] Date of Birth: Jan 18, 1941 Referring Provider (PT): Melida Quitter, MD   Encounter Date: 12/31/2020   PT End of Session - 12/31/20 1022    Visit Number 4    Number of Visits 9    Date for PT Re-Evaluation 02/01/21   POC for 8 weeks, Cert for 60 days   Authorization Type Medicare, [employer]    Progress Note Due on Visit 10    PT Start Time 1017    PT Stop Time 1057    PT Time Calculation (min) 40 min    Equipment Utilized During Treatment Gait belt    Activity Tolerance Patient tolerated treatment well    Behavior During Therapy [facility] for tasks assessed/performed           Past Medical History:  Diagnosis Date  . Adenomatous colon polyp 2006  . Anxiety   . Arthritis   . Benign esophageal stricture 10/14/2012  . CERUMEN IMPACTION, BILATERAL 12/13/2007  . CERVICAL RADICULOPATHY 11/05/2009  . Chronic kidney disease    kidney stones  . DEPRESSION 82/31/2008  . DIVERTICULOSIS, COLON 03/11/2007  . Esophagitis, reflux 10/14/2012  . GERD (gastroesophageal reflux disease) 10/14/2012  . Glaucoma   . 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 Hearing loss   . HEMORRHOIDS, EXTERNAL W/O COMPLICATION 02/14/2375   Qualifier: Diagnosis of  By: Jimmye Norman, LPN, [place] Cun[place]   . HYPERLIPIDEMIA 09/22/2007  . HYPERTENSION 03/11/2007  . Kidney stone   . Osteopenia 02/2015   T score -1.4 FRAX 7.3%/0.6%  . Rectocele   . Rosacea 03/11/2007  . Tachycardia     Past Surgical History:  Procedure Laterality Date  . ANTERIOR CERVICAL DECOMP/DISCECTOMY FUSION  08/14/2011   Procedure: ANTERIOR CERVICAL DECOMPRESSION/DISCECTOMY FUSION 1 LEVEL/HARDWARE REMOVAL;  Surgeon: [name], MD;  Location: [facility];  Service: Neurosurgery;  Laterality: N/A;   Cervical Six-Seven anterior cervical decompression with fusion interbody prothesis plating and bonegraft with removal of hardware of Cervical Four to Cervical Six   . CERVICAL FUSION     C4-6  . COLONOSCOPY     multiple  . ESOPHAGOGASTRODUODENOSCOPY  10/14/2012   Dr. [name]  . KIDNEY SURGERY     age 80  . TONSILLECTOMY      There were no vitals filed for this visit.   Subjective Assessment - 12/31/20 1020    Subjective Patient reports no new changes, no vertigo attacks. Had a very good birthday yesterday. No falls.    Pertinent History achycardia, Osteopenia, HTN, HLD, Hearing Loss, GERD, Glaucoma, Depression, CKD, Cervical Radiculopathy, Anxiety, Arthritis    Patient Stated Goals Resolve Vertigo    Currently in Pain? No/denies              [facility] Adult PT Treatment/Exercise - 12/31/20 0001      Ambulation/Gait   Ambulation/Gait Yes    Ambulation/Gait Assistance 5: Supervision    Ambulation/Gait Assistance Details throughout therapy gym with activities    Ambulation Distance (Feet) --   clinic distnace   Assistive device None    Gait Pattern Step-through pattern;Narrow base of support    Ambulation Surface Level;Indoor      Neuro Re-ed    Neuro Re-ed Details  Completed M-CTSIB able to complete situation 1-3 for full 30 seconds; situation 4 -  18 seconds. Increased postural sway with vision removed on complaint surface.           Vestibular Treatment/Exercise - 12/31/20 0001      Vestibular Treatment/Exercise   Vestibular Treatment Provided Gaze    Gaze Exercises X1 Viewing Horizontal;X1 Viewing Vertical      X1 Viewing Horizontal   Foot Position standing feet apart > standing feet together    Reps 2    Comments x 60 seconds; tolerated progression well      X1 Viewing Vertical   Foot Position standing feet apart > standing feet together    Reps 2    Comments x 60 seconds; tolerated progression well            Gaze Stabilization: Standing Feet  Together    Feet together, keeping eyes on target on wall 3-4 feet away, tilt head down 15-30 and move head side to side for 60 seconds. Repeat while moving head up and down for 60 seconds. Make sure to only turn the head, try to keep the shoulders still. Do 2-3 sessions per day.  Copyright  VHI. All rights reserved.    Balance Exercises - 12/31/20 0001      Balance Exercises: Standing   Stepping Strategy Anterior;Posterior;Foam/compliant surface;Limitations    Stepping Strategy Limitations on blue balance beam, completed anterior/posterior stepping strategy x 10 reps in B directions. increased challenge with posterior, intermittent UE support required.    Balance Beam standing across blue balance beam: completed standing with eyes closed 3 x 30 seconds, then progressed to eyes opens and horizontal/vertical head turns x 10 reps each direction. mild postural sway with vision removed, but able to maintain balance without UE support.    Tandem Gait Forward;Retro;4 reps;Limitations    Tandem Gait Limitations compelted forward tandem without UE support, retro tandem with light UE support from // bars x 4 laps down and back.    Sidestepping Foam/compliant support;3 reps;Limitations    Sidestepping Limitations completed side stepping down and back without UE support x 3 laps across blue balance beam. intermittent CGA required.             PT Short Term Goals - 12/31/20 1029      PT SHORT TERM GOAL #1   Title Patient will be independent with initial vestibular/balance HEP (All STGs Due: 12/31/20)    Baseline reports indepenence with HEP    Time 4    Period Weeks    Status Achieved    Target Date 12/31/20      PT SHORT TERM GOAL #2   Title FGA to be assesesd and LTG to be set as appropriate    Baseline 82/30    Time 4    Period Weeks    Status Achieved      PT SHORT TERM GOAL #3   Title Patient will be able to hold situation 2 of M-CTSIB for >/= 15 seconds to demo improved  vestibular input for balance    Baseline see flowsheet; 30 secs on 5/23    Time 4    Period Weeks    Status Achieved             PT Long Term Goals - 12/12/20 1203      PT LONG TERM GOAL #1   Title Patient will be independent with final vestibular/balance HEP (All LTGs Due: 01/28/21)    Baseline No HEP established    Time 8    Period Weeks    Status  New      PT LONG TERM GOAL #2   Title Patient will improve FGA to >/= 25/30 to demo improved balance and reduced fall risk    Baseline 82/30    Time 8    Period Weeks    Status Revised      PT LONG TERM GOAL #3   Title Patient will improve DFS >/= 55    Baseline DFS: 50, DPS: 60.4    Time 8    Period Weeks    Status New      PT LONG TERM GOAL #4   Title Patient will be able to hold situation 2 and 3 of M-CTSIB for >/= 25 seconds    Baseline see flowsheet    Time 8    Period Weeks    Status New                 Plan - 12/31/20 1059    Clinical Impression Statement Today's skilled PT session included assessment of patient's progress toward STG. Patient able to hold situation 2 of M-CTSIB for 30 seconds today, with patient able to meet all STG. Continued progression of VOR to standing narrow BOS with patient tolerating well. Continue to report no dizziness throughout session with gaze and high level balance. Will continue to progress toward all LTGs.    Personal Factors and Comorbidities Age;Time since onset of injury/illness/exacerbation;Comorbidity 3+    Comorbidities Tachycardia, Osteopenia, HTN, HLD, Hearing Loss, GERD, Glaucoma, Depression, CKD, Cervical Radiculopathy, Anxiety, Arthritis    Examination-Activity Limitations Transfers;Locomotion Level;Bend;Stand    Examination-Participation Restrictions Driving;Community Activity    Stability/Clinical Decision Making Evolving/Moderate complexity    Rehab Potential Fair    PT Frequency 1x / week    PT Duration 8 weeks    PT Treatment/Interventions ADLs/Self Care  Home Management;Canalith Repostioning;Cryotherapy;Moist Heat;DME Instruction;Gait training;Stair training;Patient/family education;Functional mobility training;Therapeutic activities;Therapeutic exercise;Balance training;Neuromuscular re-education;Manual techniques;Passive range of motion;Dry needling;Vestibular    PT Next Visit Plan How was visit with ENT? Check goals + D/C?    Consulted and Agree with Plan of Care Patient           Patient will benefit from skilled therapeutic intervention in order to improve the following deficits and impairments:  Abnormal gait,Difficulty walking,Dizziness,Decreased activity tolerance,Decreased balance  Visit Diagnosis: Dizziness and giddiness  Unsteadiness on feet  Difficulty in walking, not elsewhere classified     Problem List Patient Active Problem List   Diagnosis Date Noted  . Vertigo 04/06/2020  . Sensorineural hearing loss (SNHL), bilateral 04/06/2020  . Aortic atherosclerosis ([place]) 07/21/2019  . Diabetes mellitus without complication ([place]) [id number]  . GERD (gastroesophageal reflux disease) 07/14/2014  . Primary open angle glaucoma of both eyes, mild stage 05/29/2014  . Knee pain, left 11/02/2013  . IBS (irritable bowel syndrome)- diarrhea predominant 01/26/2013  . History of colonic polyps 11/28/2011  . History of supraventricular tachycardia 08/14/2011  . Senile nuclear sclerosis 06/29/2011  . Glaucoma associated with ocular disorder 11/05/2009  . Cervical disc disorder with radiculopathy of cervical region 11/05/2009  . Hyperlipidemia associated with type 2 diabetes mellitus ([place]) 09/22/2007  . Major depression in full remission ([place])- Lexapro 20mg . Xanax once a week or less. 03/11/2007  . Hypertension associated with diabetes ([place]) 03/11/2007  . Rosacea 03/11/2007    Jones Bales, PT, DPT 12/31/2020, 12:00 PM  [facility] [address] [place], [place],  [address] Phone: [phone]   Fax:  [phone]  Name: NIOME VELO  MRN: [id number] Date of Birth: 1941-06-22

## 2020-12-31 NOTE — Patient Instructions (Signed)
Gaze Stabilization: Standing Feet Together    Feet together, keeping eyes on target on wall 3-4 feet away, tilt head down 15-30 and move head side to side for 60 seconds. Repeat while moving head up and down for 60 seconds. Make sure to only turn the head, try to keep the shoulders still. Do 2-3 sessions per day.  Copyright  VHI. All rights reserved.

## 2021-01-02 ENCOUNTER — Ambulatory Visit (AMBULATORY_SURGERY_CENTER): Payer: Medicare Other | Admitting: Internal Medicine

## 2021-01-02 ENCOUNTER — Encounter: Payer: Self-pay | Admitting: Internal Medicine

## 2021-01-02 ENCOUNTER — Other Ambulatory Visit: Payer: Self-pay

## 2021-01-02 VITALS — BP 125/52 | HR 59 | Temp 96.8°F | Resp 14 | Ht 61.0 in | Wt 122.0 lb

## 2021-01-02 DIAGNOSIS — D122 Benign neoplasm of ascending colon: Secondary | ICD-10-CM | POA: Diagnosis not present

## 2021-01-02 DIAGNOSIS — K648 Other hemorrhoids: Secondary | ICD-10-CM

## 2021-01-02 DIAGNOSIS — K5730 Diverticulosis of large intestine without perforation or abscess without bleeding: Secondary | ICD-10-CM | POA: Diagnosis not present

## 2021-01-02 DIAGNOSIS — K529 Noninfective gastroenteritis and colitis, unspecified: Secondary | ICD-10-CM | POA: Diagnosis not present

## 2021-01-02 DIAGNOSIS — K449 Diaphragmatic hernia without obstruction or gangrene: Secondary | ICD-10-CM

## 2021-01-02 DIAGNOSIS — R1319 Other dysphagia: Secondary | ICD-10-CM | POA: Diagnosis not present

## 2021-01-02 HISTORY — PX: ESOPHAGOGASTRODUODENOSCOPY (EGD) WITH ESOPHAGEAL DILATION: SHX5812

## 2021-01-02 MED ORDER — SODIUM CHLORIDE 0.9 % IV SOLN
500.0000 mL | Freq: Once | INTRAVENOUS | Status: DC
Start: 2021-01-02 — End: 2021-01-02

## 2021-01-02 NOTE — Progress Notes (Signed)
Report to PACU, RN, vss, BBS= Clear.  

## 2021-01-02 NOTE — Op Note (Signed)
[facility] Patient Name: [name] Procedure Date: 01/02/2021 2:37 PM MRN: [id number] Endoscopist: Gatha Mayer , MD Age: 80 Referring MD:  Date of Birth: Jul 11, 1941 Gender: Female Account #: [id number] Procedure:                Colonoscopy Indications:              Chronic diarrhea, Clinically significant diarrhea                            of unexplained origin Medicines:                Propofol per Anesthesia, Monitored Anesthesia Care Procedure:                Pre-Anesthesia Assessment:                           - Prior to the procedure, a History and Physical                            was performed, and patient medications and                            allergies were reviewed. The patient's tolerance of                            previous anesthesia was also reviewed. The risks                            and benefits of the procedure and the sedation                            options and risks were discussed with the patient.                            All questions were answered, and informed consent                            was obtained. Prior Anticoagulants: The patient has                            taken no previous anticoagulant or antiplatelet                            agents. ASA Grade Assessment: III - A patient with                            severe systemic disease. After reviewing the risks                            and benefits, the patient was deemed in                            satisfactory condition to undergo the procedure.  After obtaining informed consent, the colonoscope                            was passed under direct vision. Throughout the                            procedure, the patient's blood pressure, pulse, and                            oxygen saturations were monitored continuously. The                            Olympus PCF-H190DL ([id number]) Colonoscope was                            introduced  through the anus and advanced to the the                            terminal ileum, with identification of the                            appendiceal orifice and IC valve. The colonoscopy                            was performed without difficulty. The patient                            tolerated the procedure well. The quality of the                            bowel preparation was good. The terminal ileum,                            ileocecal valve, appendiceal orifice, and rectum                            were photographed. The bowel preparation used was                            SuTab via split dose instruction. Scope In: 2:58:08 PM Scope Out: 3:09:20 PM Scope Withdrawal Time: 0 hours 6 minutes 59 seconds  Total Procedure Duration: 0 hours 11 minutes 12 seconds  Findings:                 Hemorrhoids were found on perianal exam.                           The digital rectal exam findings include decreased                            sphincter tone.                           A 2 mm polyp was found in the ascending colon. The  polyp was sessile. The polyp was removed with a                            cold biopsy forceps. Resection and retrieval were                            complete. Verification of patient identification                            for the specimen was done. Estimated blood loss was                            minimal.                           Many small and large-mouthed diverticula were found                            in the sigmoid colon, descending colon, transverse                            colon and ascending colon.                           The exam was otherwise without abnormality.                           No additional abnormalities were found on                            retroflexion.                           Biopsies for histology were taken with a cold                            forceps from the ascending colon, transverse  colon,                            descending colon, sigmoid colon and rectum for                            evaluation of microscopic colitis. Estimated blood                            loss was minimal. Complications:            No immediate complications. Estimated Blood Loss:     Estimated blood loss was minimal. Impression:               - Hemorrhoids found on perianal exam.                           - Decreased sphincter tone found on digital rectal  exam.                           - One 2 mm polyp in the ascending colon, removed                            with a cold biopsy forceps. Resected and retrieved.                           - Severe diverticulosis in the sigmoid colon, in                            the descending colon, in the transverse colon and                            in the ascending colon.                           - The examination was otherwise normal.                           - Biopsies were taken with a cold forceps from the                            ascending colon, transverse colon, descending                            colon, sigmoid colon and rectum for evaluation of                            microscopic colitis. Recommendation:           - Clear liquids x 1 hour then soft foods rest of                            day. Start prior diet tomorrow.                           - Continue present medications.                           - No repeat colonoscopy due to age.                           - Await pathology results. Gatha Mayer, MD 01/02/2021 3:19:54 PM This report has been signed electronically.

## 2021-01-02 NOTE — Op Note (Signed)
[facility] Patient Name: [name] Procedure Date: 01/02/2021 2:42 PM MRN: [id number] Endoscopist: Gatha Mayer , MD Age: 80 Referring MD:  Date of Birth: 10-18-1940 Gender: Female Account #: [id number] Procedure:                Upper GI endoscopy Indications:              Dysphagia Medicines:                Propofol per Anesthesia, Monitored Anesthesia Care Procedure:                Pre-Anesthesia Assessment:                           - Prior to the procedure, a History and Physical                            was performed, and patient medications and                            allergies were reviewed. The patient's tolerance of                            previous anesthesia was also reviewed. The risks                            and benefits of the procedure and the sedation                            options and risks were discussed with the patient.                            All questions were answered, and informed consent                            was obtained. Prior Anticoagulants: The patient has                            taken no previous anticoagulant or antiplatelet                            agents. ASA Grade Assessment: III - A patient with                            severe systemic disease. After reviewing the risks                            and benefits, the patient was deemed in                            satisfactory condition to undergo the procedure.                           After obtaining informed consent, the endoscope was  passed under direct vision. Throughout the                            procedure, the patient's blood pressure, pulse, and                            oxygen saturations were monitored continuously. The                            Endoscope was introduced through the mouth, and                            advanced to the second part of duodenum. The upper                            GI endoscopy was  accomplished without difficulty.                            The patient tolerated the procedure well. Scope In: Scope Out: Findings:                 The examined esophagus was mildly tortuous. The                            scope was withdrawn. Dilation was performed with a                            Maloney dilator with mild resistance at 25 Fr. The                            dilation site was examined following endoscope                            reinsertion and showed no change. Estimated blood                            loss: none.                           A 2 cm hiatal hernia was present.                           The exam was otherwise without abnormality.                           The cardia and gastric fundus were normal on                            retroflexion.                           The gastroesophageal flap valve was visualized                            endoscopically and classified as Hill Grade III                            (  minimal fold, loose to endoscope, hiatal hernia                            likely). Complications:            No immediate complications. Estimated Blood Loss:     Estimated blood loss: none. Impression:               - Tortuous esophagus.                           - 2 cm hiatal hernia.                           - The examination was otherwise normal.                           - Gastroesophageal flap valve classified as Hill                            Grade III (minimal fold, loose to endoscope, hiatal                            hernia likely).                           - No specimens collected. Recommendation:           - Patient has a contact number available for                            emergencies. The signs and symptoms of potential                            delayed complications were discussed with the                            patient. Return to normal activities tomorrow.                            Written discharge instructions  were provided to the                            patient.                           - Clear liquids x 1 hour then soft foods rest of                            day. Start prior diet tomorrow.                           - Continue present medications.                           - See the other procedure note for documentation of  additional recommendations.                           - Clear liquids x 1 hour then soft foods rest of                            day. Start prior diet tomorrow.                           if this fails to help diet modiufication +/-                            Altoids qAC - suspect some element of                            presbyesophagus Gatha Mayer, MD 01/02/2021 3:16:47 PM This report has been signed electronically.

## 2021-01-02 NOTE — Patient Instructions (Addendum)
Nothing bad seen.  I dilated the esophagus - it looks the same as it did in 2014. I hope the dilation helps you. If not the best option will probably be to be more careful with foods by cutting smaller and chewing more as it could be esophagus just not working as well as it used to.  I removed a tiny benign colon polyp and saw your diverticulosis. The lining looks fine but I did take biopsies to see if there is any inflammation.  I will let you know results and check on you within a couple of weeks.  I appreciate the opportunity to care for you. Gatha Mayer, MD, [facility]  Please read handouts provided. Continue present medications. Please follow Dilation Diet Protocol handout.    YOU HAD AN ENDOSCOPIC PROCEDURE TODAY AT [facility]:   Refer to the procedure report that was given to you for any specific questions about what was found during the examination.  If the procedure report does not answer your questions, please call your gastroenterologist to clarify.  If you requested that your care partner not be given the details of your procedure findings, then the procedure report has been included in a sealed envelope for you to review at your convenience later.  YOU SHOULD EXPECT: Some feelings of bloating in the abdomen. Passage of more gas than usual.  Walking can help get rid of the air that was put into your GI tract during the procedure and reduce the bloating. If you had a lower endoscopy (such as a colonoscopy or flexible sigmoidoscopy) you may notice spotting of blood in your stool or on the toilet paper. If you underwent a bowel prep for your procedure, you may not have a normal bowel movement for a few days.  Please Note:  You might notice some irritation and congestion in your nose or some drainage.  This is from the oxygen used during your procedure.  There is no need for concern and it should clear up in a day or so.  SYMPTOMS TO REPORT IMMEDIATELY:   Following  lower endoscopy (colonoscopy or flexible sigmoidoscopy):  Excessive amounts of blood in the stool  Significant tenderness or worsening of abdominal pains  Swelling of the abdomen that is new, acute  Fever of 100F or higher   Following upper endoscopy (EGD)  Vomiting of blood or coffee ground material  New chest pain or pain under the shoulder blades  Painful or persistently difficult swallowing  New shortness of breath  Fever of 100F or higher  Black, tarry-looking stools  For urgent or emergent issues, a gastroenterologist can be reached at any hour by calling [phone]. Do not use MyChart messaging for urgent concerns.    DIET:  Drink plenty of fluids but you should avoid alcoholic beverages for 24 hours.  ACTIVITY:  You should plan to take it easy for the rest of today and you should NOT DRIVE or use heavy machinery until tomorrow (because of the sedation medicines used during the test).    FOLLOW UP: Our staff will call the number listed on your records 48-72 hours following your procedure to check on you and address any questions or concerns that you may have regarding the information given to you following your procedure. If we do not reach you, we will leave a message.  We will attempt to reach you two times.  During this call, we will ask if you have developed any symptoms of COVID 19.  If you develop any symptoms (ie: fever, flu-like symptoms, shortness of breath, cough etc.) before then, please call [phone].  If you test positive for Covid 19 in the 2 weeks post procedure, please call and report this information to [place].    If any biopsies were taken you will be contacted by phone or by letter within the next 1-3 weeks.  Please call us at [phone] if you have not heard about the biopsies in 3 weeks.    SIGNATURES/CONFIDENTIALITY: You and/or your care partner have signed paperwork which will be entered into your electronic medical record.  These signatures  attest to the fact that that the information above on your After Visit Summary has been reviewed and is understood.  Full responsibility of the confidentiality of this discharge information lies with you and/or your care-partner.

## 2021-01-02 NOTE — Progress Notes (Signed)
History reviewed today 

## 2021-01-02 NOTE — Progress Notes (Signed)
Called to room to assist during endoscopic procedure.  Patient ID and intended procedure confirmed with present staff. Received instructions for my participation in the procedure from the performing physician.  

## 2021-01-04 ENCOUNTER — Telehealth: Payer: Self-pay

## 2021-01-04 NOTE — Telephone Encounter (Signed)
Left message on follow up call. 

## 2021-01-09 ENCOUNTER — Other Ambulatory Visit: Payer: Self-pay

## 2021-01-09 ENCOUNTER — Ambulatory Visit: Payer: Medicare Other | Attending: Physician Assistant

## 2021-01-09 DIAGNOSIS — R42 Dizziness and giddiness: Secondary | ICD-10-CM | POA: Diagnosis not present

## 2021-01-09 DIAGNOSIS — R2681 Unsteadiness on feet: Secondary | ICD-10-CM | POA: Diagnosis present

## 2021-01-09 NOTE — Therapy (Signed)
[facility] [address] [place], [place], [address] Phone: [phone]   Fax:  [phone]  Physical Therapy Treatment/Discharge Summary  Patient Details  Name: [name] MRN: [id number] Date of Birth: August 15, 1940 Referring Provider (PT): Melida Quitter, MD  PHYSICAL THERAPY DISCHARGE SUMMARY  Visits from Start of Care: 5  Current functional level related to goals / functional outcomes: See Clinical Impression Statemetn   Remaining deficits: Low Fall Risk   Education / Equipment: HEP provided  Plan: Patient agrees to discharge.  Patient goals were met. Patient is being discharged due to meeting the stated rehab goals.  ?????           Encounter Date: 01/09/2021   PT End of Session - 01/09/21 1105    Visit Number 5    Number of Visits 9    Date for PT Re-Evaluation 02/01/21   POC for 8 weeks, Cert for 60 days   Authorization Type Medicare, [employer]    Progress Note Due on Visit 10    PT Start Time 1101    PT Stop Time 1129    PT Time Calculation (min) 28 min    Equipment Utilized During Treatment Gait belt    Activity Tolerance Patient tolerated treatment well    Behavior During Therapy WFL for tasks assessed/performed           Past Medical History:  Diagnosis Date  . Adenomatous colon polyp 2006  . Anxiety   . Arthritis   . Benign esophageal stricture 10/14/2012  . CERUMEN IMPACTION, BILATERAL 12/13/2007  . CERVICAL RADICULOPATHY 11/05/2009  . Chronic kidney disease    kidney stones  . DEPRESSION 03/11/2007  . DIVERTICULOSIS, COLON 03/11/2007  . Esophagitis, reflux 10/14/2012  . GERD (gastroesophageal reflux disease) 10/14/2012  . Glaucoma   . 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 Hearing loss   . HEMORRHOIDS, EXTERNAL W/O COMPLICATION 9/37/9024   Qualifier: Diagnosis of  By: Jimmye Norman, LPN, [facility]   . HYPERLIPIDEMIA 09/22/2007  . HYPERTENSION 03/11/2007  . Kidney  stone   . Osteopenia 02/2015   T score -1.4 FRAX 7.3%/0.6%  . Rectocele   . Rosacea 03/11/2007  . Tachycardia     Past Surgical History:  Procedure Laterality Date  . ANTERIOR CERVICAL DECOMP/DISCECTOMY FUSION  08/14/2011   Procedure: ANTERIOR CERVICAL DECOMPRESSION/DISCECTOMY FUSION 1 LEVEL/HARDWARE REMOVAL;  Surgeon: [name], MD;  Location: [facility];  Service: Neurosurgery;  Laterality: N/A;  Cervical Six-Seven anterior cervical decompression with fusion interbody prothesis plating and bonegraft with removal of hardware of Cervical Four to Cervical Six   . CERVICAL FUSION     C4-6  . COLONOSCOPY     multiple  . ESOPHAGOGASTRODUODENOSCOPY  10/14/2012   Dr. [name]  . KIDNEY SURGERY     age 15  . TONSILLECTOMY      There were no vitals filed for this visit.   Subjective Assessment - 01/09/21 1103    Subjective Reports that appt with ENT went well, only has to follow up if needed. Patient reports she has been very busy. But overall doing well. No episodes of dizziness/vertigo attacks.    Pertinent History achycardia, Osteopenia, HTN, HLD, Hearing Loss, GERD, Glaucoma, Depression, CKD, Cervical Radiculopathy, Anxiety, Arthritis    Patient Stated Goals Resolve Vertigo    Currently in Pain? No/denies              [facility] PT Assessment - 01/09/21 0001  Observation/Other Assessments   Focus on Therapeutic Outcomes (FOTO)  DFS: 62, DPS: 69.8      Functional Gait  Assessment   Gait assessed  Yes    Gait Level Surface Walks 20 ft in less than 5.5 sec, no assistive devices, good speed, no evidence for imbalance, normal gait pattern, deviates no more than 6 in outside of the 12 in walkway width.    Change in Gait Speed Able to smoothly change walking speed without loss of balance or gait deviation. Deviate no more than 6 in outside of the 12 in walkway width.    Gait with Horizontal Head Turns Performs head turns smoothly with no change in gait. Deviates no more than 6  in outside 12 in walkway width    Gait with Vertical Head Turns Performs head turns with no change in gait. Deviates no more than 6 in outside 12 in walkway width.    Gait and Pivot Turn Pivot turns safely within 3 sec and stops quickly with no loss of balance.    Step Over Obstacle Is able to step over one shoe box (4.5 in total height) without changing gait speed. No evidence of imbalance.    Gait with Narrow Base of Support Ambulates 4-7 steps.    Gait with Eyes Closed Walks 20 ft, no assistive devices, good speed, no evidence of imbalance, normal gait pattern, deviates no more than 6 in outside 12 in walkway width. Ambulates 20 ft in less than 7 sec.    Ambulating Backwards Walks 20 ft, uses assistive device, slower speed, mild gait deviations, deviates 6-10 in outside 12 in walkway width.    Steps Alternating feet, must use rail.    Total Score 25    FGA comment: 25/30            [facility] Adult PT Treatment/Exercise - 01/09/21 0001      Ambulation/Gait   Ambulation/Gait Yes    Ambulation/Gait Assistance 6: Modified independent (Device/Increase time)    Ambulation/Gait Assistance Details througohut therapy gym with activities    Assistive device None    Gait Pattern Step-through pattern;Narrow base of support    Ambulation Surface Level;Indoor          Completed review of the following HEP to ensure proper completion/compliance. No questions/concerns from patient at this time:   Gaze Stabilization: Standing Feet Together    Feet together, keeping eyes on target on wall 3-4 feet away, tilt head down 15-30 and move head side to side for 60 seconds. Repeat while moving head up and down for 60 seconds. Make sure to only turn the head, try to keep the shoulders still. Do 2-3 sessions per day.    Access Code: [email or web address] URL: [email or web address] Date: 12/19/2020 Prepared by: Baldomero Lamy  Exercises Tandem Stance - 1 x daily - 5 x weekly - 1 sets - 2 reps - 15-20  seconds hold Standing Balance with Eyes Closed on Foam - 1 x daily - 5 x weekly - 1 sets - 3 reps - 30 hold Wide Stance with Head Rotation on Foam Pad - 1 x daily - 5 x weekly - 2 sets - 10 reps Wide Stance with Head Nods on Foam Pad - 1 x daily - 5 x weekly - 2 sets - 10 reps Walking with Head Rotation - 1 x daily - 7 x weekly - 1 sets - 3 reps        PT Education - 01/09/21 1133    Education  Details HEP Review; Progress toward LTGs    Person(s) Educated Patient    Methods Explanation    Comprehension Verbalized understanding            PT Short Term Goals - 12/31/20 1029      PT SHORT TERM GOAL #1   Title Patient will be independent with initial vestibular/balance HEP (All STGs Due: 12/31/20)    Baseline reports indepenence with HEP    Time 4    Period Weeks    Status Achieved    Target Date 12/31/20      PT SHORT TERM GOAL #2   Title FGA to be assesesd and LTG to be set as appropriate    Baseline 21/30    Time 4    Period Weeks    Status Achieved      PT SHORT TERM GOAL #3   Title Patient will be able to hold situation 2 of M-CTSIB for >/= 15 seconds to demo improved vestibular input for balance    Baseline see flowsheet; 30 secs on 5/23    Time 4    Period Weeks    Status Achieved             PT Long Term Goals - 01/09/21 1107      PT LONG TERM GOAL #1   Title Patient will be independent with final vestibular/balance HEP (All LTGs Due: 01/28/21)    Baseline reports independence; completing 3-4x/week    Time 8    Period Weeks    Status Achieved      PT LONG TERM GOAL #2   Title Patient will improve FGA to >/= 25/30 to demo improved balance and reduced fall risk    Baseline 21/30; 25/30 on 01/09/21    Time 8    Period Weeks    Status Achieved      PT LONG TERM GOAL #3   Title Patient will improve DFS >/= 55    Baseline DFS: 50, DPS: 60.4; DFS: 62, DPS: 69.8    Time 8    Period Weeks    Status Achieved      PT LONG TERM GOAL #4   Title Patient  will be able to hold situation 2 and 3 of M-CTSIB for >/= 25 seconds    Baseline able to hold situation 2 + 3 for full 30 seconds    Time 8    Period Weeks    Status Achieved                 Plan - 01/09/21 1131    Clinical Impression Statement Today's skilled PT session included assesment of patient's progress toward all LTG. Patient able to meet all LTG today during session, improving FGA to 25/30. Patient reports significant improvement in dizziness with no vertigo attacks. Reviewed HEP with patient to ensure compliance upon d/c. Patient demonstrating readiness for d/c, with patient verbalizing agreement.    Personal Factors and Comorbidities Age;Time since onset of injury/illness/exacerbation;Comorbidity 3+    Comorbidities Tachycardia, Osteopenia, HTN, HLD, Hearing Loss, GERD, Glaucoma, Depression, CKD, Cervical Radiculopathy, Anxiety, Arthritis    Examination-Activity Limitations Transfers;Locomotion Level;Bend;Stand    Examination-Participation Restrictions Driving;Community Activity    Stability/Clinical Decision Making Evolving/Moderate complexity    Rehab Potential Fair    PT Frequency 1x / week    PT Duration 8 weeks    PT Treatment/Interventions ADLs/Self Care Home Management;Canalith Repostioning;Cryotherapy;Moist Heat;DME Instruction;Gait training;Stair training;Patient/family education;Functional mobility training;Therapeutic activities;Therapeutic exercise;Balance training;Neuromuscular re-education;Manual techniques;Passive range of motion;Dry  needling;Vestibular    Consulted and Agree with Plan of Care Patient           Patient will benefit from skilled therapeutic intervention in order to improve the following deficits and impairments:  Abnormal gait,Difficulty walking,Dizziness,Decreased activity tolerance,Decreased balance  Visit Diagnosis: Dizziness and giddiness  Unsteadiness on feet     Problem List Patient Active Problem List   Diagnosis Date Noted   . Vertigo 04/06/2020  . Sensorineural hearing loss (SNHL), bilateral 04/06/2020  . Aortic atherosclerosis ([place]) 07/21/2019  . Diabetes mellitus without complication ([place]) 15/86/8257  . GERD (gastroesophageal reflux disease) 07/14/2014  . Primary open angle glaucoma of both eyes, mild stage 05/29/2014  . Knee pain, left 11/02/2013  . IBS (irritable bowel syndrome)- diarrhea predominant 01/26/2013  . History of colonic polyps 11/28/2011  . History of supraventricular tachycardia 08/14/2011  . Senile nuclear sclerosis 06/29/2011  . Glaucoma associated with ocular disorder 11/05/2009  . Cervical disc disorder with radiculopathy of cervical region 11/05/2009  . Hyperlipidemia associated with type 2 diabetes mellitus ([place]) 09/22/2007  . Major depression in full remission ([place])- Lexapro 36m. Xanax once a week or less. 03/11/2007  . Hypertension associated with diabetes ([place] 03/11/2007  . Rosacea 03/11/2007    KJones Bales PT, DPT 01/09/2021, 11:35 AM  [id number] [place] [address]Phone: [phone]  Fax:  [phone] Name: BALMAS RAKEMRN: [id number] of Birth: 5Apr 22, 1942

## 2021-01-09 NOTE — Patient Instructions (Addendum)
Access Code: [email or web address] URL: [email or web address] Date: 12/19/2020 Prepared by: Baldomero Lamy  Exercises Tandem Stance - 1 x daily - 5 x weekly - 1 sets - 2 reps - 15-20 seconds hold Standing Balance with Eyes Closed on Foam - 1 x daily - 5 x weekly - 1 sets - 3 reps - 30 hold Wide Stance with Head Rotation on Foam Pad - 1 x daily - 5 x weekly - 2 sets - 10 reps Wide Stance with Head Nods on Foam Pad - 1 x daily - 5 x weekly - 2 sets - 10 reps Walking with Head Rotation - 1 x daily - 7 x weekly - 1 sets - 3 reps   Gaze Stabilization: Standing Feet Together    Feet together, keeping eyes on target on wall 3-4 feet away, tilt head down 15-30 and move head side to side for 60 seconds. Repeat while moving head up and down for 60 seconds. Make sure to only turn the head, try to keep the shoulders still. Do 2-3 sessions per day.

## 2021-02-05 NOTE — Progress Notes (Signed)
Phone [phone] In person visit   Subjective:   [name] is a 80 y.o. year old very pleasant female patient who presents for/with See problem oriented charting Chief Complaint  Patient presents with   Diabetes   Hypertension   Hyperlipidemia    This visit occurred during the SARS-CoV-2 public health emergency.  Safety protocols were in place, including screening questions prior to the visit, additional usage of staff PPE, and extensive cleaning of exam room while observing appropriate contact time as indicated for disinfecting solutions.   Past Medical History-  Patient Active Problem List   Diagnosis Date Noted   Diabetes mellitus without complication ([place]) 86/76/7209    Priority: High   History of supraventricular tachycardia 08/14/2011    Priority: High   Aortic atherosclerosis ([place]) 07/21/2019    Priority: Medium   GERD (gastroesophageal reflux disease) 07/14/2014    Priority: Medium   Primary open angle glaucoma of both eyes, mild stage 05/29/2014    Priority: Medium   IBS (irritable bowel syndrome)- diarrhea predominant 01/26/2013    Priority: Medium   Glaucoma associated with ocular disorder 11/05/2009    Priority: Medium   Hyperlipidemia associated with type 2 diabetes mellitus ([place]) 09/22/2007    Priority: Medium   Major depression in full remission ([place])- Lexapro 20mg . Xanax once a week or less. 03/11/2007    Priority: Medium   Hypertension associated with diabetes ([place]) 03/11/2007    Priority: Medium   Sensorineural hearing loss (SNHL), bilateral 04/06/2020    Priority: Low   History of colonic polyps 11/28/2011    Priority: Low   Senile nuclear sclerosis 06/29/2011    Priority: Low   Cervical disc disorder with radiculopathy of cervical region 11/05/2009    Priority: Low   Rosacea 03/11/2007    Priority: Low   Vertigo 04/06/2020   Knee pain, left 11/02/2013    Medications- reviewed and updated Current Outpatient Medications  Medication Sig  Dispense Refill   acetaminophen (TYLENOL) 325 MG tablet Take 650 mg by mouth every 6 (six) hours as needed for moderate pain.     ALPRAZolam (XANAX) 0.25 MG tablet TAKE 1 TABLET (0.25 MG TOTAL) BY MOUTH DAILY AS NEEDED. FOR ANXIETY 30 tablet 5   atorvastatin (LIPITOR) 20 MG tablet Please take 1 tablet (20mg ) by mouth twice a week. 26 tablet 3   Calcium Carbonate-Vitamin D (CALCIUM + D PO) Take 1 tablet by mouth daily.     diltiazem (CARDIZEM CD) 240 MG 24 hr capsule TAKE 1 CAPSULE BY MOUTH EVERY DAY 90 capsule 3   dorzolamide-timolol (COSOPT) 22.3-6.8 MG/ML ophthalmic solution Place 1 drop into both eyes 2 (two) times daily.     escitalopram (LEXAPRO) 20 MG tablet TAKE 1 TABLET BY MOUTH EVERY DAY 90 tablet 1   hydrochlorothiazide (HYDRODIURIL) 25 MG tablet TAKE 1 TABLET BY MOUTH EVERY DAY 90 tablet 3   pantoprazole (PROTONIX) 20 MG tablet Take 1 tablet (20 mg total) by mouth daily. 90 tablet 3   valsartan (DIOVAN) 320 MG tablet TAKE 1 TABLET BY MOUTH EVERY DAY 90 tablet 3   meclizine (ANTIVERT) 25 MG tablet Take 1 tablet (25 mg total) by mouth 3 (three) times daily as needed for dizziness or nausea (vertigo). (Patient not taking: Reported on 02/06/2021) 30 tablet 0   ondansetron (ZOFRAN) 4 MG tablet Take 1 tablet (4 mg total) by mouth every 8 (eight) hours as needed for nausea or vomiting. (Patient not taking: No sig reported) 20 tablet 0  No current facility-administered medications for this visit.     Objective:  BP 126/76   Pulse 65   Temp 98.2 F (36.8 C) (Temporal)   Ht 5\' 1"  (1.549 m)   Wt 118 lb (53.5 kg)   LMP 08/12/1991   SpO2 97%   BMI 22.30 kg/m  Gen: NAD, resting comfortably CV: RRR no murmurs rubs or gallops Lungs: CTAB no crackles, wheeze, rhonchi Ext: no edema Skin: warm, dry      Assessment and Plan   #social update- [place] trip planned with family this summer- they plan it all and she enjoys!  # Diabetesg S: Diet controlled. New diagnosis in 2020  after 2 out of 3 last A1c is over 6.5. Lab Results  Component Value Date   HGBA1C 6.5 (H) 07/25/2020   HGBA1C 6.9 (H) 01/24/2020   HGBA1C 6.5 07/21/2019  A/P:  hopefully stable- update A1c today. Continue without meds for now   #hyperlipidemia associated with diabetes/aortic atherosclerosis- incidental finding on prior imaging  S:  trial 10mg  atorvastatin once a week 07/21/2019-titrated up to 20 mg twice a weekDecember 2021 Lab Results  Component Value Date   CHOL 216 (H) 07/25/2020   HDL 37 (L) 07/25/2020   LDLCALC 132 (H) 07/25/2020   LDLDIRECT 135.0 08/16/2018   TRIG 329 (H) 07/25/2020   CHOLHDL 5.8 (H) 07/25/2020  A/P: With aortic atherosclerosis would prefer LDL at least below 100-we will update direct LDL with labs today - shed prefer to move slowly on dose- perhaps 3x a week if needed  #hypertension/history of SVT. S: compliant with Cardizem 240mg , HCTZ 25mg  and Diovan 320mg  Home reading #s:does not check No obvious recurrence of SVT on Cardizem. No palpitations BP Readings from Last 3 Encounters:  02/06/21 126/76  01/02/21 (!) 125/52  10/31/20 120/70  A/P: Blood pressure is well controlled.  No clear recurrence of SVT-last visit with [name] was in 2019- she prefers to hold off on returning unless recurrence.   #Depression/anxiety- lost son to MI 2019 and daughter to pancreatic cancer 2020 and another son to MI 2021 then granddaughter lost husband in [place]- left 2 little kids S: Patient compliant with Lexapro 20 mg. Patient takes sparing alprazolam- about once a week Depression screen [facility] 2/9 02/06/2021 07/25/2020 01/24/2020  Decreased Interest 0 0 0  Down, Depressed, Hopeless 1 1 0  PHQ - 2 Score 1 1 0  Altered sleeping 0 1 0  Tired, decreased energy 0 1 1  Change in appetite 0 0 0  Feeling bad or failure about yourself  1 0 0  Trouble concentrating 0 0 0  Moving slowly or fidgety/restless 0 0 0  Suicidal thoughts 0 0 0  PHQ-9 Score 2 3 1   Difficult doing  work/chores Not difficult at all Not difficult at all Not difficult at all  Some recent data might be hidden  A/P: Thankfully patient has been doing well despite multiple life stressors- Would consider full remission-continue current medication  #GERD- with small hiatal hernia on CT S: Patient is compliant with Protonix 40 mg But we were later able to titrate down to 20 mg-she reports no recurrence of symptoms  A/P: protonix 20 mg controlling symptoms- we discussed possibly trying famotidine/pepcid over the counter but would need to be twice a day before meals- she prefers to stay on current medicine   Recommended follow up: Return in about 6 months (around 08/08/2021) for follow up- or sooner if needed.  Lab/Order associations:  ICD-10-CM   1. Diabetes mellitus without complication (HCC)  R49.3 Comprehensive metabolic panel    LDL cholesterol, direct    Hemoglobin A1c    CBC with Differential/Platelet    2. Major depressive disorder with single episode, in full remission ([place])  F32.5     3. Hypertension associated with diabetes ([place])  E11.59    I15.2     4. Hyperlipidemia associated with type 2 diabetes mellitus (HCC)  E11.69 Comprehensive metabolic panel   X52.1 LDL cholesterol, direct    CBC with Differential/Platelet    5. Gastroesophageal reflux disease without esophagitis  K21.9     6. Aortic atherosclerosis (HCC) Chronic I70.0      Return precautions advised.  Garret Reddish, MD

## 2021-02-06 ENCOUNTER — Other Ambulatory Visit: Payer: Self-pay

## 2021-02-06 ENCOUNTER — Ambulatory Visit (INDEPENDENT_AMBULATORY_CARE_PROVIDER_SITE_OTHER): Payer: Medicare Other | Admitting: Family Medicine

## 2021-02-06 ENCOUNTER — Encounter: Payer: Self-pay | Admitting: Family Medicine

## 2021-02-06 VITALS — BP 126/76 | HR 65 | Temp 98.2°F | Ht 61.0 in | Wt 118.0 lb

## 2021-02-06 DIAGNOSIS — E119 Type 2 diabetes mellitus without complications: Secondary | ICD-10-CM | POA: Diagnosis not present

## 2021-02-06 DIAGNOSIS — E1159 Type 2 diabetes mellitus with other circulatory complications: Secondary | ICD-10-CM

## 2021-02-06 DIAGNOSIS — K219 Gastro-esophageal reflux disease without esophagitis: Secondary | ICD-10-CM

## 2021-02-06 DIAGNOSIS — E785 Hyperlipidemia, unspecified: Secondary | ICD-10-CM | POA: Diagnosis not present

## 2021-02-06 DIAGNOSIS — F325 Major depressive disorder, single episode, in full remission: Secondary | ICD-10-CM | POA: Diagnosis not present

## 2021-02-06 DIAGNOSIS — I152 Hypertension secondary to endocrine disorders: Secondary | ICD-10-CM

## 2021-02-06 DIAGNOSIS — E1169 Type 2 diabetes mellitus with other specified complication: Secondary | ICD-10-CM

## 2021-02-06 DIAGNOSIS — I700 Atherosclerosis of aorta: Secondary | ICD-10-CM

## 2021-02-06 LAB — CBC WITH DIFFERENTIAL/PLATELET
Basophils Absolute: 0 10*3/uL (ref 0.0–0.1)
Basophils Relative: 0.5 % (ref 0.0–3.0)
Eosinophils Absolute: 0 10*3/uL (ref 0.0–0.7)
Eosinophils Relative: 0.7 % (ref 0.0–5.0)
HCT: 41.3 % (ref 36.0–46.0)
Hemoglobin: 13.8 g/dL (ref 12.0–15.0)
Lymphocytes Relative: 34.4 % (ref 12.0–46.0)
Lymphs Abs: 2.5 10*3/uL (ref 0.7–4.0)
MCHC: 33.4 g/dL (ref 30.0–36.0)
MCV: 94.1 fl (ref 78.0–100.0)
Monocytes Absolute: 0.5 10*3/uL (ref 0.1–1.0)
Monocytes Relative: 6.8 % (ref 3.0–12.0)
Neutro Abs: 4.1 10*3/uL (ref 1.4–7.7)
Neutrophils Relative %: 57.6 % (ref 43.0–77.0)
Platelets: 286 10*3/uL (ref 150.0–400.0)
RBC: 4.39 Mil/uL (ref 3.87–5.11)
RDW: 12.5 % (ref 11.5–15.5)
WBC: 7.2 10*3/uL (ref 4.0–10.5)

## 2021-02-06 LAB — COMPREHENSIVE METABOLIC PANEL
ALT: 15 U/L (ref 0–35)
AST: 15 U/L (ref 0–37)
Albumin: 4.4 g/dL (ref 3.5–5.2)
Alkaline Phosphatase: 51 U/L (ref 39–117)
BUN: 23 mg/dL (ref 6–23)
CO2: 31 mEq/L (ref 19–32)
Calcium: 10.3 mg/dL (ref 8.4–10.5)
Chloride: 99 mEq/L (ref 96–112)
Creatinine, Ser: 0.95 mg/dL (ref 0.40–1.20)
GFR: 55.96 mL/min — ABNORMAL LOW (ref >60.00)
Glucose, Bld: 137 mg/dL — ABNORMAL HIGH (ref 70–99)
Potassium: 4.3 mEq/L (ref 3.5–5.1)
Sodium: 139 mEq/L (ref 135–145)
Total Bilirubin: 0.7 mg/dL (ref 0.2–1.2)
Total Protein: 7 g/dL (ref 6.0–8.3)

## 2021-02-06 LAB — HEMOGLOBIN A1C: Hgb A1c MFr Bld: 6.6 % — ABNORMAL HIGH (ref 4.6–6.5)

## 2021-02-06 LAB — LDL CHOLESTEROL, DIRECT: Direct LDL: 80 mg/dL

## 2021-02-06 NOTE — Patient Instructions (Addendum)
Health Maintenance Due  Topic Date Due   OPHTHALMOLOGY EXAM If you have had your eye exam within the last year, please sign release of information at check out desk. If you have not had an eye exam within a year, please get one at this time as this is important for your diabetes care  Never done   Zoster Vaccines- Shingrix (1 of 2) Please check with your pharmacy to see if they have the shingrix vaccine. If they do- please get this immunization and update [place] by phone call or mychart with dates you receive the vaccine  Never done   Consider prevnar 20 at pharmacy or here at next visit   Please stop by lab before you go If you have mychart- we will send your results within 3 business days of [place] receiving them.  If you do not have mychart- we will call you about results within 5 business days of [place] receiving them.  *please also note that you will see labs on mychart as soon as they post. I will later go in and write notes on them- will say "notes from Dr. Yong Channel"   protonix 20 mg controlling symptoms- we discussed possibly trying famotidine/pepcid over the counter but would need to be twice a day before meals- she prefers to stay on current medicine   You are eligible to schedule your annual wellness visit with our nurse specialist Otila Kluver.  Please consider scheduling this before you leave today   Recommended follow up: No follow-ups on file.

## 2021-02-07 ENCOUNTER — Other Ambulatory Visit: Payer: Self-pay

## 2021-02-07 MED ORDER — ATORVASTATIN CALCIUM 20 MG PO TABS: 20.0000 mg | ORAL_TABLET | ORAL | 3 refills | Status: DC

## 2021-03-08 ENCOUNTER — Other Ambulatory Visit: Payer: Self-pay | Admitting: Family Medicine

## 2021-03-30 ENCOUNTER — Other Ambulatory Visit: Payer: Self-pay | Admitting: Family Medicine

## 2021-04-17 ENCOUNTER — Telehealth: Payer: Self-pay

## 2021-04-17 NOTE — Telephone Encounter (Signed)
Patient is scheduled with [facility].   Nurse Assessment Nurse: D'Heur Lucia Gaskins, RN, Adrienne Date/Time (Eastern Time): 04/17/2021 9:21:02 AM Confirm and document reason for call. If symptomatic, describe symptoms. ---Caller states she is experiencing difficulty swallowing with nausea and a headache. Her mouth is very sore on the inside. She can't eat or drink d/t nausea. S/S started last night. It feels like things aren't going down, stuck. Does the patient have any new or worsening symptoms? ---Yes Will a triage be completed? ---Yes Related visit to physician within the last 2 weeks? ---No Does the PT have any chronic conditions? (i.e. diabetes, asthma, this includes High risk factors for pregnancy, etc.) ---Yes List chronic conditions. ---HTN, glaucoma Is this a behavioral health or substance abuse call? ---No Guidelines Guideline Title Affirmed Question Affirmed Notes Nurse Date/Time [name] Time) Swallowing Difficulty [1] Swallowing difficulty AND [2] cause unknown (Exception: difficulty swallowing is a chronic symptom) D'Heur Lucia Gaskins, RN, Adrienne 04/17/2021 9:23:54 AM PLEASE NOTE: All timestamps contained within this report are represented as [place] Standard Time. CONFIDENTIALTY NOTICE: This fax transmission is intended only for the addressee. It contains information that is legally privileged, confidential or otherwise protected from use or disclosure. If you are not the intended recipient, you are strictly prohibited from reviewing, disclosing, copying using or disseminating any of this information or taking any action in reliance on or regarding this information. If you have received this fax in error, please notify us immediately by telephone so that we can arrange for its return to [place]. Phone: [phone], Toll-Free: [phone], Fax: [phone] Page: 2 of 2 Call Id: [id number] [place]. Time [name] Time) Disposition Final User 04/17/2021 9:06:14 AM Attempt made - line  busy [place], RN, [place] 04/17/2021 9:36:05 AM See PCP within 24 Hours Yes D'Heur Lucia Gaskins, RN, Vincente Liberty Caller Disagree/Comply Comply Caller Understands Yes PreDisposition Call Doctor Care Advice Given Per Guideline SEE PCP WITHIN 24 HOURS: CALL BACK IF: * You become worse CARE ADVICE given per Swallowed Difficulty (Adult) guideline. Comments User: Vincente Liberty, D'Heur Lucia Gaskins, RN Date/Time [name] Time): 04/17/2021 9:25:52 AM Roof of mouth is sore & tongue is white. User: Vincente Liberty, D'Heur Lucia Gaskins, RN Date/Time (Eastern Time): 04/17/2021 9:27:10 AM Caller states the inside of her mouth is very red. User: Vincente Liberty, D'Heur Lucia Gaskins, RN Date/Time [name] Time): 04/17/2021 9:32:24 AM Caller states she feels a pressure/tightness right at the bottom of the esophagus. Sometimes she has occasional episodes of strangling when drinking. User: Vincente Liberty, D'Heur Lucia Gaskins, RN Date/Time [name] Time): 04/17/2021 9:34:08 AM The tightness in esophageal area eases up when she is lying down. User: Vincente Liberty, D'Heur Lucia Gaskins, RN Date/Time [name] Time): 04/17/2021 9:38:38 AM Warm transferred to office for appointment. Referrals REFERRED TO PCP OFFICE

## 2021-04-18 ENCOUNTER — Encounter: Payer: Self-pay | Admitting: Family

## 2021-04-18 ENCOUNTER — Ambulatory Visit (INDEPENDENT_AMBULATORY_CARE_PROVIDER_SITE_OTHER): Payer: Medicare Other | Admitting: Family

## 2021-04-18 ENCOUNTER — Other Ambulatory Visit: Payer: Self-pay

## 2021-04-18 VITALS — BP 110/60 | HR 71 | Temp 98.7°F | Ht 61.0 in | Wt 118.8 lb

## 2021-04-18 DIAGNOSIS — E538 Deficiency of other specified B group vitamins: Secondary | ICD-10-CM | POA: Diagnosis not present

## 2021-04-18 DIAGNOSIS — G4489 Other headache syndrome: Secondary | ICD-10-CM

## 2021-04-18 DIAGNOSIS — R0989 Other specified symptoms and signs involving the circulatory and respiratory systems: Secondary | ICD-10-CM | POA: Diagnosis not present

## 2021-04-18 DIAGNOSIS — E785 Hyperlipidemia, unspecified: Secondary | ICD-10-CM

## 2021-04-18 DIAGNOSIS — R42 Dizziness and giddiness: Secondary | ICD-10-CM | POA: Diagnosis not present

## 2021-04-18 DIAGNOSIS — K219 Gastro-esophageal reflux disease without esophagitis: Secondary | ICD-10-CM

## 2021-04-18 DIAGNOSIS — E1169 Type 2 diabetes mellitus with other specified complication: Secondary | ICD-10-CM | POA: Diagnosis not present

## 2021-04-18 DIAGNOSIS — R1310 Dysphagia, unspecified: Secondary | ICD-10-CM | POA: Diagnosis not present

## 2021-04-18 LAB — POCT URINALYSIS DIPSTICK
Bilirubin, UA: NEGATIVE
Blood, UA: POSITIVE
Glucose, UA: NEGATIVE
Ketones, UA: NEGATIVE
Leukocytes, UA: NEGATIVE
Nitrite, UA: NEGATIVE
Protein, UA: POSITIVE — AB
Spec Grav, UA: >=1.03 — AB (ref 1.010–1.025)
Urobilinogen, UA: 0.2 E.U./dL
pH, UA: 5.5 (ref 5.0–8.0)

## 2021-04-18 NOTE — Progress Notes (Signed)
Acute Office Visit  Subjective:    Patient ID: [name], female    DOB: 29-Jun-1941, 80 y.o.   MRN: [id number]  Chief Complaint  Patient presents with  . problems swallowing    Pt states her food was getting stuck while swallowing and she had a little pain and she states her mouth felt like it had sores in it, she feels better today but her stomach hurts and she feels woozy.    HPI Patient is in today with c/o difficulty swallowing and feeling like she had sores in her mouth when she initially woke up yesterday morning. She reports attempting to drink liquid and the liquid felt as if it was caught in her throat. She had difficulty swallowing to allow the liquid to go down. This lasted several hours then resolved spontaneously. Also reports a headache that was a 10/10. Today, she reports a dull headache and feeling a little "woozy." Denies and chest pain, palpitations, no blurred vision or double vision. Reports a family history of TIAs in her sister and father.   Has not been unable to tolerate her cholesterol medication and would like to speak with Dr. Yong Channel at her next visit about possible alternatives.   Past Medical History:  Diagnosis Date  . Adenomatous colon polyp 2006  . Anxiety   . Arthritis   . Benign esophageal stricture 10/14/2012  . CERUMEN IMPACTION, BILATERAL 12/13/2007  . CERVICAL RADICULOPATHY 11/05/2009  . Chronic kidney disease    kidney stones  . DEPRESSION 03/11/2007  . DIVERTICULOSIS, COLON 03/11/2007  . Esophagitis, reflux 10/14/2012  . GERD (gastroesophageal reflux disease) 10/14/2012  . Glaucoma   . 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 Hearing loss   . HEMORRHOIDS, EXTERNAL W/O COMPLICATION 0/62/3762   Qualifier: Diagnosis of  By: Jimmye Norman, LPN, [place]inf[place] Cunas   . HYPERLIPIDEMIA 09/22/2007  . HYPERTENSION 03/11/2007  . Kidney stone   . Osteopenia 02/2015   T score -1.4 FRAX 7.3%/0.6%  . Rectocele   . Rosacea 03/11/2007  . Tachycardia      Past Surgical History:  Procedure Laterality Date  . ANTERIOR CERVICAL DECOMP/DISCECTOMY FUSION  08/14/2011   Procedure: ANTERIOR CERVICAL DECOMPRESSION/DISCECTOMY FUSION 1 LEVEL/HARDWARE REMOVAL;  Surgeon: [name], MD;  Location: [facility];  Service: Neurosurgery;  Laterality: N/A;  Cervical Six-Seven anterior cervical decompression with fusion interbody prothesis plating and bonegraft with removal of hardware of Cervical Four to Cervical Six   . CERVICAL FUSION     C4-6  . COLONOSCOPY     multiple  . ESOPHAGOGASTRODUODENOSCOPY  10/14/2012   Dr. [name]  . KIDNEY SURGERY     age 66  . TONSILLECTOMY      Family History  Problem Relation Age of Onset  . Mental illness Mother        comitted suicide  . Glaucoma Mother   . Lung cancer Father        smoker  . Hypertension Father   . Heart disease Father        age 42, smoker  . Cerebral palsy Sister   . Stroke Sister        multiple  . Diabetes Maternal Grandmother   . Stroke Paternal Aunt   . Stroke Paternal Uncle   . Stroke Paternal Uncle   . Stroke Paternal Uncle   . Stroke Paternal Aunt   . Pancreatic cancer Daughter   . Heart attack Son        32  .  Heart attack Son        47  . Colon cancer Neg Hx   . Stomach cancer Neg Hx   . Esophageal cancer Neg Hx   . Rectal cancer Neg Hx     Social History   Socioeconomic History  . Marital status: Widowed    Spouse name: Not on file  . Number of children: Not on file  . Years of education: Not on file  . Highest education level: Not on file  Occupational History  . Occupation: retired    Fish farm manager: RETIRED  Tobacco Use  . Smoking status: Never  . Smokeless tobacco: Never  Vaping Use  . Vaping Use: Never used  Substance and Sexual Activity  . Alcohol use: No    Alcohol/week: 0.0 standard drinks  . Drug use: No  . Sexual activity: Never    Birth control/protection: Post-menopausal    Comment: 1st intercourse 80 yo-Fewer than 5 partners  Other  Topics Concern  . Not on file  Social History Narrative   Lives alone. Widowed. 2 living children (lost son to MI 2019 and daughter to pancreatic cancer 2020, another son MI 2021) and 7 grandchildren. 3 greatgrandkids      Retired from [employer] after 5 years and later took care of children.       Hobbies: time with grandkids, shopping, go out to eat. Travels once a year with girlfriends.    Social Determinants of Health   Financial Resource Strain: Not on file  Food Insecurity: Not on file  Transportation Needs: Not on file  Physical Activity: Not on file  Stress: Not on file  Social Connections: Not on file  Intimate Partner Violence: Not on file    Outpatient Medications Prior to Visit  Medication Sig Dispense Refill  . acetaminophen (TYLENOL) 325 MG tablet Take 650 mg by mouth every 6 (six) hours as needed for moderate pain.    [name] ALPRAZolam (XANAX) 0.25 MG tablet TAKE 1 TABLET (0.25 MG TOTAL) BY MOUTH DAILY AS NEEDED. FOR ANXIETY 30 tablet 5  . atorvastatin (LIPITOR) 20 MG tablet Take 1 tablet (20 mg total) by mouth 3 (three) times a week. 30 tablet 3  . Calcium Carbonate-Vitamin D (CALCIUM + D PO) Take 1 tablet by mouth daily.    [name] diltiazem (CARDIZEM CD) 240 MG 24 hr capsule TAKE 1 CAPSULE BY MOUTH EVERY DAY 90 capsule 3  . dorzolamide-timolol (COSOPT) 22.3-6.8 MG/ML ophthalmic solution Place 1 drop into both eyes 2 (two) times daily.    [name] escitalopram (LEXAPRO) 20 MG tablet TAKE 1 TABLET BY MOUTH EVERY DAY 90 tablet 1  . hydrochlorothiazide (HYDRODIURIL) 25 MG tablet TAKE 1 TABLET BY MOUTH EVERY DAY 90 tablet 3  . meclizine (ANTIVERT) 25 MG tablet Take 1 tablet (25 mg total) by mouth 3 (three) times daily as needed for dizziness or nausea (vertigo). 30 tablet 0  . ondansetron (ZOFRAN) 4 MG tablet Take 1 tablet (4 mg total) by mouth every 8 (eight) hours as needed for nausea or vomiting. 20 tablet 0  . pantoprazole (PROTONIX) 20 MG tablet Take 1 tablet (20 mg total) by mouth daily.  90 tablet 3  . valsartan (DIOVAN) 320 MG tablet TAKE 1 TABLET BY MOUTH EVERY DAY 90 tablet 3   No facility-administered medications prior to visit.    Allergies  Allergen Reactions  . Codeine Nausea Only    Review of Systems  Constitutional:  Positive for fatigue.  HENT:  Positive for mouth sores.  Difficulty swallowing  Respiratory: Negative.    Cardiovascular: Negative.   Gastrointestinal: Negative.  Negative for diarrhea and nausea.  Endocrine: Negative.   Genitourinary: Negative.   Musculoskeletal: Negative.   Skin: Negative.   Allergic/Immunologic: Negative.   Neurological:  Positive for dizziness and headaches.  Psychiatric/Behavioral: Negative.    All other systems reviewed and are negative.     Objective:    Physical Exam Constitutional:      Appearance: Normal appearance. She is normal weight.  HENT:     Right Ear: Tympanic membrane, ear canal and external ear normal.     Left Ear: Tympanic membrane, ear canal and external ear normal.     Mouth/Throat:     Mouth: Mucous membranes are moist.     Comments: No sores in the mouth Eyes:     Extraocular Movements: Extraocular movements intact.     Pupils: Pupils are equal, round, and reactive to light.  Cardiovascular:     Rate and Rhythm: Normal rate and regular rhythm.  Pulmonary:     Effort: Pulmonary effort is normal.     Breath sounds: Normal breath sounds.  Abdominal:     General: Abdomen is flat. Bowel sounds are normal.     Palpations: Abdomen is soft.  Musculoskeletal:        General: No tenderness. Normal range of motion.     Cervical back: Normal range of motion and neck supple.  Skin:    General: Skin is warm and dry.  Neurological:     General: No focal deficit present.     Mental Status: She is alert and oriented to person, place, and time. Mental status is at baseline.     Cranial Nerves: No cranial nerve deficit.     Motor: No weakness.     Coordination: Coordination normal.      Gait: Gait normal.     Deep Tendon Reflexes: Reflexes normal.   BP 110/60   Pulse 71   Temp 98.7 F (37.1 C)   Ht '5\' 1"'  (1.549 m)   Wt 118 lb 12.8 oz (53.9 kg)   LMP 08/12/1991   SpO2 95%   BMI 22.45 kg/m  Wt Readings from Last 3 Encounters:  04/18/21 118 lb 12.8 oz (53.9 kg)  02/06/21 118 lb (53.5 kg)  01/02/21 122 lb (55.3 kg)    Health Maintenance Due  Topic Date Due  . OPHTHALMOLOGY EXAM  Never done  . INFLUENZA VACCINE  03/11/2021  . COVID-19 Vaccine (5 - Booster for Pfizer series) 03/24/2021  . Zoster Vaccines- Shingrix (2 of 2) 04/04/2021    There are no preventive care reminders to display for this patient.   Lab Results  Component Value Date   TSH 1.05 03/17/2019   Lab Results  Component Value Date   WBC 7.2 02/06/2021   HGB 13.8 02/06/2021   HCT 41.3 02/06/2021   MCV 94.1 02/06/2021   PLT 286.0 02/06/2021   Lab Results  Component Value Date   NA 139 02/06/2021   K 4.3 02/06/2021   CO2 31 02/06/2021   GLUCOSE 137 (H) 02/06/2021   BUN 23 02/06/2021   CREATININE 0.95 02/06/2021   BILITOT 0.7 02/06/2021   ALKPHOS 51 02/06/2021   AST 15 02/06/2021   ALT 15 02/06/2021   PROT 7.0 02/06/2021   ALBUMIN 4.4 02/06/2021   CALCIUM 10.3 02/06/2021   ANIONGAP 14 03/14/2020   GFR 55.96 (L) 02/06/2021   Lab Results  Component Value Date   CHOL  216 (H) 07/25/2020   Lab Results  Component Value Date   HDL 37 (L) 07/25/2020   Lab Results  Component Value Date   LDLCALC 132 (H) 07/25/2020   Lab Results  Component Value Date   TRIG 329 (H) 07/25/2020   Lab Results  Component Value Date   CHOLHDL 5.8 (H) 07/25/2020   Lab Results  Component Value Date   HGBA1C 6.6 (H) 02/06/2021       Assessment & Plan:   Problem List Items Addressed This Visit     Hyperlipidemia associated with type 2 diabetes mellitus ([place])   Relevant Orders   Comp Met (CMET)   Lipid Profile   GERD (gastroesophageal reflux disease)   Other Visit Diagnoses      Dizziness    -  Primary   Relevant Orders   US Carotid Duplex Bilateral   CBC w/Diff   POC Urinalysis Dipstick   Dysphagia, unspecified type       Relevant Orders   US Carotid Duplex Bilateral   B12   CBC w/Diff   Other headache syndrome       Relevant Orders   Comp Met (CMET)   CBC w/Diff   Bilateral carotid bruits       Relevant Orders   US Carotid Duplex Bilateral   Comp Met (CMET)   Lipid Profile   Vitamin B 12 deficiency       Relevant Orders   B12      Plan: If symptoms return, advised to go to the ED immediately. Will order Carotid doppler study, labs, and UA and notify patient pending results. Discuss cholesterol medication options pending results. For now, continue twice weekly. Call the office with any questions or concerns.     Kennyth Arnold, FNP

## 2021-04-18 NOTE — Patient Instructions (Signed)
Dizziness Dizziness is a common problem. It is a feeling of unsteadiness or light-headedness. You may feel like you are about to faint. Dizziness can lead to injury if you stumble or fall. Anyone can become dizzy, but dizziness is more common in older adults. This condition can be caused by a number of things, including medicines, dehydration, or illness. Follow these instructions at home: Eating and drinking  Drink enough fluid to keep your urine pale yellow. This helps to keep you from becoming dehydrated. Try to drink more clear fluids, such as water. Do not drink alcohol. Limit your caffeine intake if told to do so by your health care provider. Check ingredients and nutrition facts to see if a food or beverage contains caffeine. Limit your salt (sodium) intake if told to do so by your health care provider. Check ingredients and nutrition facts to see if a food or beverage contains sodium. Activity  Avoid making quick movements. Rise slowly from chairs and steady yourself until you feel okay. In the morning, first sit up on the side of the bed. When you feel okay, stand slowly while you hold onto something until you know that your balance is good. If you need to stand in one place for a long time, move your legs often. Tighten and relax the muscles in your legs while you are standing. Do not drive or use machinery if you feel dizzy. Avoid bending down if you feel dizzy. Place items in your home so that they are easy for you to reach without leaning over. Lifestyle Do not use any products that contain nicotine or tobacco. These products include cigarettes, chewing tobacco, and vaping devices, such as e-cigarettes. If you need help quitting, ask your health care provider. Try to reduce your stress level by using methods such as yoga or meditation. Talk with your health care provider if you need help to manage your stress. General instructions Watch your dizziness for any changes. Take  over-the-counter and prescription medicines only as told by your health care provider. Talk with your health care provider if you think that your dizziness is caused by a medicine that you are taking. Tell a friend or a family member that you are feeling dizzy. If he or she notices any changes in your behavior, have this person call your health care provider. Keep all follow-up visits. This is important. Contact a health care provider if: Your dizziness does not go away or you have new symptoms. Your dizziness or light-headedness gets worse. You feel nauseous. You have reduced hearing. You have a fever. You have neck pain or a stiff neck. Your dizziness leads to an injury or a fall. Get help right away if: You vomit or have diarrhea and are unable to eat or drink anything. You have problems talking, walking, swallowing, or using your arms, hands, or legs. You feel generally weak. You have any bleeding. You are not thinking clearly or you have trouble forming sentences. It may take a friend or family member to notice this. You have chest pain, abdominal pain, shortness of breath, or sweating. Your vision changes or you develop a severe headache. These symptoms may represent a serious problem that is an emergency. Do not wait to see if the symptoms will go away. Get medical help right away. Call your local emergency services (911 in the [place] Do not drive yourself to the hospital. Summary Dizziness is a feeling of unsteadiness or light-headedness. This condition can be caused by a number of   things, including medicines, dehydration, or illness. Anyone can become dizzy, but dizziness is more common in older adults. Drink enough fluid to keep your urine pale yellow. Do not drink alcohol. Avoid making quick movements if you feel dizzy. Monitor your dizziness for any changes. This information is not intended to replace advice given to you by your health care provider. Make sure you discuss any  questions you have with your health care provider. Document Revised: 07/02/2020 Document Reviewed: 07/02/2020 Elsevier Patient Education  2022 Elsevier Inc.  

## 2021-04-19 LAB — CBC WITH DIFFERENTIAL/PLATELET
Basophils Absolute: 0.1 K/uL (ref 0.0–0.1)
Basophils Relative: 1.1 % (ref 0.0–3.0)
Eosinophils Absolute: 0.1 K/uL (ref 0.0–0.7)
Eosinophils Relative: 0.7 % (ref 0.0–5.0)
HCT: 39.8 % (ref 36.0–46.0)
Hemoglobin: 13.1 g/dL (ref 12.0–15.0)
Lymphocytes Relative: 26 % (ref 12.0–46.0)
Lymphs Abs: 2.3 K/uL (ref 0.7–4.0)
MCHC: 32.8 g/dL (ref 30.0–36.0)
MCV: 95 fl (ref 78.0–100.0)
Monocytes Absolute: 0.7 K/uL (ref 0.1–1.0)
Monocytes Relative: 7.3 % (ref 3.0–12.0)
Neutro Abs: 5.8 K/uL (ref 1.4–7.7)
Neutrophils Relative %: 64.9 % (ref 43.0–77.0)
Platelets: 295 K/uL (ref 150.0–400.0)
RBC: 4.19 Mil/uL (ref 3.87–5.11)
RDW: 12.8 % (ref 11.5–15.5)
WBC: 9 K/uL (ref 4.0–10.5)

## 2021-04-19 LAB — LIPID PANEL
Cholesterol: 184 mg/dL (ref 0–200)
HDL: 33.6 mg/dL — ABNORMAL LOW (ref >39.00)
NonHDL: 150.07
Total CHOL/HDL Ratio: 5
Triglycerides: 326 mg/dL — ABNORMAL HIGH (ref 0.0–149.0)
VLDL: 65.2 mg/dL — ABNORMAL HIGH (ref 0.0–40.0)

## 2021-04-19 LAB — COMPREHENSIVE METABOLIC PANEL
ALT: 13 U/L (ref 0–35)
AST: 12 U/L (ref 0–37)
Albumin: 3.9 g/dL (ref 3.5–5.2)
Alkaline Phosphatase: 53 U/L (ref 39–117)
BUN: 14 mg/dL (ref 6–23)
CO2: 31 mEq/L (ref 19–32)
Calcium: 9.7 mg/dL (ref 8.4–10.5)
Chloride: 101 mEq/L (ref 96–112)
Creatinine, Ser: 0.85 mg/dL (ref 0.40–1.20)
GFR: 63.86 mL/min (ref >60.00)
Glucose, Bld: 117 mg/dL — ABNORMAL HIGH (ref 70–99)
Potassium: 3.7 mEq/L (ref 3.5–5.1)
Sodium: 141 mEq/L (ref 135–145)
Total Bilirubin: 0.6 mg/dL (ref 0.2–1.2)
Total Protein: 6.2 g/dL (ref 6.0–8.3)

## 2021-04-19 LAB — VITAMIN B12: Vitamin B-12: 991 pg/mL — ABNORMAL HIGH (ref 211–911)

## 2021-04-19 LAB — LDL CHOLESTEROL, DIRECT: Direct LDL: 95 mg/dL

## 2021-04-24 ENCOUNTER — Telehealth: Payer: Self-pay

## 2021-04-24 NOTE — Telephone Encounter (Signed)
Pt called about lab results. °

## 2021-04-24 NOTE — Telephone Encounter (Signed)
Returned pt call and labs reviewed.  

## 2021-04-25 ENCOUNTER — Ambulatory Visit
Admission: RE | Admit: 2021-04-25 | Discharge: 2021-04-25 | Disposition: A | Discharge status: Home / Self Care | Payer: Medicare Other | Source: Ambulatory Visit | Attending: Family | Admitting: Family

## 2021-04-25 DIAGNOSIS — R42 Dizziness and giddiness: Secondary | ICD-10-CM

## 2021-04-25 DIAGNOSIS — R0989 Other specified symptoms and signs involving the circulatory and respiratory systems: Secondary | ICD-10-CM

## 2021-04-25 DIAGNOSIS — R1310 Dysphagia, unspecified: Secondary | ICD-10-CM

## 2021-05-13 ENCOUNTER — Other Ambulatory Visit: Payer: Self-pay

## 2021-05-13 ENCOUNTER — Encounter: Payer: Self-pay | Admitting: Internal Medicine

## 2021-05-13 ENCOUNTER — Ambulatory Visit (INDEPENDENT_AMBULATORY_CARE_PROVIDER_SITE_OTHER): Payer: Medicare Other | Admitting: Internal Medicine

## 2021-05-13 VITALS — BP 118/62 | HR 59 | Ht 61.0 in | Wt 119.4 lb

## 2021-05-13 DIAGNOSIS — I471 Supraventricular tachycardia: Secondary | ICD-10-CM | POA: Diagnosis not present

## 2021-05-13 DIAGNOSIS — I10 Essential (primary) hypertension: Secondary | ICD-10-CM

## 2021-05-13 MED ORDER — HYDROCHLOROTHIAZIDE 12.5 MG PO CAPS: 12.5000 mg | ORAL_CAPSULE | Freq: Every day | ORAL | 0 refills | Status: DC

## 2021-05-13 NOTE — Progress Notes (Signed)
PCP: [name], MD   Primary EP: Dr Stormy Fabian is a 80 y.o. female who presents today for routine electrophysiology followup.  Since last being seen in our clinic, the patient reports doing very well.  She has not had SVT is years.  She has rare postural dizziness.  Today, she denies symptoms of palpitations, chest pain, shortness of breath,  lower extremity edema, presyncope, or syncope.  The patient is otherwise without complaint today.   Past Medical History:  Diagnosis Date   Adenomatous colon polyp 2006   Anxiety    Arthritis    Benign esophageal stricture 10/14/2012   CERUMEN IMPACTION, BILATERAL 12/13/2007   CERVICAL RADICULOPATHY 11/05/2009   Chronic kidney disease    kidney stones   DEPRESSION 03/11/2007   DIVERTICULOSIS, COLON 03/11/2007   Esophagitis, reflux 10/14/2012   GERD (gastroesophageal reflux disease) 10/14/2012   Glaucoma    GLAUCOMA ASSOCIATED W/UNSPEC OCULAR DISORDER 11/05/2009   Hearing loss    HEMORRHOIDS, EXTERNAL W/O COMPLICATION 6/50/3546   Qualifier: Diagnosis of  By: Jimmye Norman, LPN, Bonnye M    HYPERLIPIDEMIA 09/22/2007   HYPERTENSION 03/11/2007   Kidney stone    Osteopenia 02/2015   T score -1.4 FRAX 7.3%/0.6%   Rectocele    Rosacea 03/11/2007   Tachycardia    Past Surgical History:  Procedure Laterality Date   ANTERIOR CERVICAL DECOMP/DISCECTOMY FUSION  08/14/2011   Procedure: ANTERIOR CERVICAL DECOMPRESSION/DISCECTOMY FUSION 1 LEVEL/HARDWARE REMOVAL;  Surgeon: [name], MD;  Location: [facility];  Service: Neurosurgery;  Laterality: N/A;  Cervical Six-Seven anterior cervical decompression with fusion interbody prothesis plating and bonegraft with removal of hardware of Cervical Four to Cervical Six    CERVICAL FUSION     C4-6   COLONOSCOPY     multiple   ESOPHAGOGASTRODUODENOSCOPY  10/14/2012   Dr. [name]   KIDNEY SURGERY     age 69   TONSILLECTOMY      ROS- all systems are reviewed and negatives except as per HPI  above  Current Outpatient Medications  Medication Sig Dispense Refill   acetaminophen (TYLENOL) 325 MG tablet Take 650 mg by mouth every 6 (six) hours as needed for moderate pain.     ALPRAZolam (XANAX) 0.25 MG tablet TAKE 1 TABLET (0.25 MG TOTAL) BY MOUTH DAILY AS NEEDED. FOR ANXIETY 30 tablet 5   atorvastatin (LIPITOR) 20 MG tablet Take 1 tablet (20 mg total) by mouth 3 (three) times a week. 30 tablet 3   Calcium Carbonate-Vitamin D (CALCIUM + D PO) Take 1 tablet by mouth daily.     diltiazem (CARDIZEM CD) 240 MG 24 hr capsule TAKE 1 CAPSULE BY MOUTH EVERY DAY 90 capsule 3   dorzolamide-timolol (COSOPT) 22.3-6.8 MG/ML ophthalmic solution Place 1 drop into both eyes 2 (two) times daily.     escitalopram (LEXAPRO) 20 MG tablet TAKE 1 TABLET BY MOUTH EVERY DAY 90 tablet 1   hydrochlorothiazide (HYDRODIURIL) 25 MG tablet TAKE 1 TABLET BY MOUTH EVERY DAY 90 tablet 3   meclizine (ANTIVERT) 25 MG tablet Take 1 tablet (25 mg total) by mouth 3 (three) times daily as needed for dizziness or nausea (vertigo). 30 tablet 0   ondansetron (ZOFRAN) 4 MG tablet Take 1 tablet (4 mg total) by mouth every 8 (eight) hours as needed for nausea or vomiting. 20 tablet 0   pantoprazole (PROTONIX) 20 MG tablet Take 1 tablet (20 mg total) by mouth daily. 90 tablet 3   valsartan (DIOVAN)  320 MG tablet TAKE 1 TABLET BY MOUTH EVERY DAY 90 tablet 3   No current facility-administered medications for this visit.    Physical Exam: Vitals:   05/13/21 1142  BP: 118/62  Pulse: (!) 59  SpO2: 97%  Weight: 119 lb 6.4 oz (54.2 kg)  Height: 5\' 1"  (1.549 m)    GEN- The patient is well appearing, alert and oriented x 3 today.   Head- normocephalic, atraumatic Eyes-  Sclera clear, conjunctiva pink Ears- hearing intact Oropharynx- clear Lungs- Clear to ausculation bilaterally, normal work of breathing Heart- Regular rate and rhythm, no murmurs, rubs or gallops, PMI not laterally displaced GI- soft, NT, ND, +  BS Extremities- no clubbing, cyanosis, or edema  Wt Readings from Last 3 Encounters:  05/13/21 119 lb 6.4 oz (54.2 kg)  04/18/21 118 lb 12.8 oz (53.9 kg)  02/06/21 118 lb (53.5 kg)    EKG tracing ordered today is personally reviewed and shows sinus bradycardia  Assessment and Plan:  HTN Given postural dizziness, we will wean off of hctz.  Reduce to 12.5mg  daily x 4 weeks then discontinue  2. SVT Well controlled Consider reducing diltiazem CD to 180mg  daily on return  Return to see EP APP annually  Thompson Grayer MD, [facility] 05/13/2021 11:47 AM

## 2021-05-13 NOTE — Patient Instructions (Addendum)
Medication Instructions:  Reduce your Hydrochlorothiazide to 12.5 mg daily for 4 weeks then stop Your physician recommends that you continue on your current medications as directed. Please refer to the Current Medication list given to you today. *If you need a refill on your cardiac medications before your next appointment, please call your pharmacy*  Lab Work: None. If you have labs (blood work) drawn today and your tests are completely normal, you will receive your results only by: [place] (if you have MyChart) OR A paper copy in the mail If you have any lab test that is abnormal or we need to change your treatment, we will call you to review the results.  Testing/Procedures: None.  Follow-Up: At [facility], you and your health needs are our priority.  As part of our continuing mission to provide you with exceptional heart care, we have created designated Provider Care Teams.  These Care Teams include your primary Cardiologist (physician) and Advanced Practice Providers (APPs -  Physician Assistants and Nurse Practitioners) who all work together to provide you with the care you need, when you need it.  Your physician wants you to follow-up in: 12 months with   one of the following Advanced Practice Providers on your designated Care Team:    Tommye Standard, PA-C    You will receive a reminder letter in the mail two months in advance. If you don't receive a letter, please call our office to schedule the follow-up appointment.  We recommend signing up for the patient portal called "MyChart".  Sign up information is provided on this After Visit Summary.  MyChart is used to connect with patients for Virtual Visits (Telemedicine).  Patients are able to view lab/test results, encounter notes, upcoming appointments, etc.  Non-urgent messages can be sent to your provider as well.   To learn more about what you can do with MyChart, go to [email or web address].    Any Other Special  Instructions Will Be Listed Below (If Applicable).

## 2021-06-06 ENCOUNTER — Other Ambulatory Visit: Payer: Self-pay | Admitting: Internal Medicine

## 2021-06-07 ENCOUNTER — Other Ambulatory Visit: Payer: Self-pay | Admitting: Family Medicine

## 2021-06-19 ENCOUNTER — Telehealth: Payer: Self-pay | Admitting: Internal Medicine

## 2021-06-19 NOTE — Telephone Encounter (Signed)
Pt states that she started having several  abdominal pain yesterday afternoon. Pt states that she has had 5 loose stools thus far today. Pt states she thinks it maybe diverticulitis. Pt rates the abdominal pain 8 out of 10.  Please advise

## 2021-06-19 NOTE — Telephone Encounter (Signed)
Inbound call from pt requesting a call back stating that she is experiencing stomach pain. Please advise. Thank you

## 2021-06-19 NOTE — Telephone Encounter (Signed)
I called this patient with longstanding irritable bowel syndrome.  She ate out last night she had a salad and grilled chicken with cheese on it.  Diarrhea started last night and things intensified and the pain intensified today.  She has tolerated some food eating potatoes and Jell-O.  She thinks the pain may be a little bit better.  There is no fever and no bleeding.  I have advised her to stay on a liquid diet and that we would call her tomorrow to recheck her.  I do not think we have enough to go on to make a diagnosis of diverticulitis as that typically does not cause diarrhea though in her it might have been in the past.  I am suspicious of an IBS flare.  She will also use her IBgard.

## 2021-06-20 NOTE — Telephone Encounter (Signed)
Pt states that she is feeling better and that she is not in the pain she was.  Pt states that she is been eating grits, toast, and soup.  Pt notified that if she does start to feel worse then she can give [place] a call.  Pt verbalized understanding with all questions answered

## 2021-06-24 ENCOUNTER — Telehealth: Payer: Self-pay

## 2021-06-24 ENCOUNTER — Telehealth (INDEPENDENT_AMBULATORY_CARE_PROVIDER_SITE_OTHER): Payer: Medicare Other | Admitting: Physician Assistant

## 2021-06-24 DIAGNOSIS — J069 Acute upper respiratory infection, unspecified: Secondary | ICD-10-CM

## 2021-06-24 DIAGNOSIS — R051 Acute cough: Secondary | ICD-10-CM

## 2021-06-24 MED ORDER — BENZONATATE 100 MG PO CAPS: 100.0000 mg | ORAL_CAPSULE | Freq: Three times a day (TID) | ORAL | 0 refills | Status: AC | PRN

## 2021-06-24 NOTE — Telephone Encounter (Signed)
Patient was advised to call back with covid test results, [place] says she is negative.

## 2021-06-24 NOTE — Telephone Encounter (Signed)
Patient has virtual appt with Allwardt today 11/14.

## 2021-06-24 NOTE — Telephone Encounter (Signed)
Notified patient of lab results.Patient voices understanding.  

## 2021-06-24 NOTE — Telephone Encounter (Signed)
Patient Name: [name] Gender: Female DOB: 1941/03/14 Age: 80 Y 68 M 21 D Return Phone Number: [phone] (Primary) Address: City/ State/ Zip: [place]  [address] Client [place] [facility] [place] Client Site [place] [facility] [place] Night Physician Garret Reddish- MD Contact Type Call Who Is Calling Patient / Member / Family / Caregiver Call Type Triage / Clinical Relationship To Patient Self Return Phone Number [phone] (Primary) Chief Complaint Headache Reason for Call Symptomatic / Request for [name] states she is calling because she has a headache, earache, and her throat. She can hardly swallow [place] Not Listed [facility] No Nurse Assessment Nurse: Hardin Negus, RN, Mardene Celeste Date/Time [name] Time): 06/22/2021 10:23:28 AM Confirm and document reason for call. If symptomatic, describe symptoms. ---She is calling because she has a headache, earache, and her throat. She can hardly swallow. FH warm She has been sick for 2days. She was feeling better yesterday. She feels much worse this morning. Ear hurts and she cannot put hearing aids Does the patient have any new or worsening symptoms? ---Yes Will a triage be completed? ---Yes Related visit to physician within the last 2 weeks? ---No Does the PT have any chronic conditions? (i.e. diabetes, asthma, this includes High risk factors for pregnancy, etc.) ---Yes List chronic conditions. ---hypertension Is this a behavioral health or substance abuse call? ---No Guidelines Guideline Title Affirmed Question Affirmed Notes Nurse Date/Time (Eastern Time) COVID-19 - Diagnosed or Suspected [1] Headache AND [2] stiff neck (can't touch chin to chest) Hardin Negus, RN, Mardene Celeste 06/22/2021 10:25:42   Disp. Time [name] Time) Disposition Final User 06/22/2021 10:29:19 AM Go to ED Now Yes Hardin Negus, RN,  [name] Disagree/Comply Comply Caller Understands Yes PreDisposition Call Doctor Care Advice Given Per Guideline GO TO ED NOW: REASSURANCE AND EDUCATION - GOING TO THE ED OR URGENT CARE CENTER DURING THE COVID-19 PANDEMIC: CARE ADVICE given per COVID-19 - DIAGNOSED OR SUSPECTED (Adult) guideline. ANOTHER ADULT SHOULD DRIVE: * It is better and safer if another adult drives instead of you. Referrals GO TO FACILITY OTHER - SPECIFY

## 2021-06-24 NOTE — Telephone Encounter (Signed)
Patient notified of message below.

## 2021-06-24 NOTE — Progress Notes (Signed)
Virtual Visit via Telephone Note  I connected with [name] on 06/24/21 at 11:30 AM EST by telephone and verified that I am speaking with the correct person using two identifiers.  Location: Patient: home Provider: Therapist, music at [employer]   I discussed the limitations, risks, security and privacy concerns of performing an evaluation and management service by telephone and the availability of in person appointments. I also discussed with the patient that there may be a patient responsible charge related to this service. The patient expressed understanding and agreed to proceed.   History of Present Illness:  Chief complaint: Productive cough, sneezing  Symptom onset: 06/21/21 Pertinent positives: HA & ST are better, fatigued, body aches Pertinent negatives: Diarrhea, N/V, abdominal pain, CP, SOB, fever, chills Treatments tried: Currently taking amoxicillin, but nothing OTC Vaccine status: UTD with all vaccines including flu and COVID-19  Sick exposure: No known sick contacts  -Started with abdominal pain, then subsided.  -[facility] on 06/22/21. Strep, Flu, COVID-19 tests all negative. She was started on amoxicillin and says she is not feeling any better.  -Now having chest congestion and sneezing.    Observations/Objective:  No way to check vital signs per patient. Speaking clearly without signs of distress or pauses for breath. She sounds very congested.    Assessment and Plan:  1. Acute URI 2. Acute cough -Concerned about possible false negative testing done at Urgent Care -She is going to try to repeat home COVID-19 testing and call back with results. -If she cannot do a home test, she is going to have someone bring her to office for repeat COVID-19 and flu testing. If positive for either, will start on anti-virals. -Tessalon perles Rx sent to help with cough. -Tea with honey, lemon, humidifier, nasal saline, rest all to help as  well. -Low threshold for ED if acutely worse   Follow Up Instructions:    I discussed the assessment and treatment plan with the patient. The patient was provided an opportunity to ask questions and all were answered. The patient agreed with the plan and demonstrated an understanding of the instructions.   The patient was advised to call back or seek an in-person evaluation if the symptoms worsen or if the condition fails to improve as anticipated.  I provided 11 minutes & 10 seconds of non-face-to-face time during this encounter.   Almira Phetteplace M Porchia Sinkler, PA-C

## 2021-06-25 NOTE — Telephone Encounter (Addendum)
Patient is calling in stating that she her insurance would not pay for the the tessalon pearls, and has been taking the amoxicillin that was prescribed. Wanting to know what the next steps are as her cough is better but she just is not feeling well.

## 2021-06-26 NOTE — Telephone Encounter (Signed)
Patient states she had a call this morning but was disconnected.   States congestion has gotten better but still has some.    States she is weak.   States she is not eating a lot due to food not tasting right.  States her ears are still bothering her.   States has tested again for COVID and it has came back negative.  States everything she eats is going through her.  This has been going on for 3-4 days.    I have scheduled patient for in person evaluation with Allwardt 11/17 at 12pm.    Please follow back up in regard to patient.

## 2021-06-26 NOTE — Telephone Encounter (Signed)
Notified patient of message below. Appt schedule 06/27/21

## 2021-06-27 ENCOUNTER — Encounter: Payer: Self-pay | Admitting: Physician Assistant

## 2021-06-27 ENCOUNTER — Ambulatory Visit (INDEPENDENT_AMBULATORY_CARE_PROVIDER_SITE_OTHER)
Admission: RE | Admit: 2021-06-27 | Discharge: 2021-06-27 | Disposition: A | Discharge status: Home / Self Care | Payer: Medicare Other | Source: Ambulatory Visit | Attending: Physician Assistant | Admitting: Physician Assistant

## 2021-06-27 ENCOUNTER — Other Ambulatory Visit: Payer: Self-pay

## 2021-06-27 ENCOUNTER — Other Ambulatory Visit: Payer: Medicare Other

## 2021-06-27 ENCOUNTER — Ambulatory Visit (INDEPENDENT_AMBULATORY_CARE_PROVIDER_SITE_OTHER): Payer: Medicare Other | Admitting: Physician Assistant

## 2021-06-27 VITALS — BP 132/77 | HR 89 | Temp 98.2°F | Ht 61.0 in | Wt 116.4 lb

## 2021-06-27 DIAGNOSIS — R058 Other specified cough: Secondary | ICD-10-CM

## 2021-06-27 DIAGNOSIS — R6889 Other general symptoms and signs: Secondary | ICD-10-CM | POA: Diagnosis not present

## 2021-06-27 DIAGNOSIS — R197 Diarrhea, unspecified: Secondary | ICD-10-CM

## 2021-06-27 LAB — CBC WITH DIFFERENTIAL/PLATELET
Basophils Absolute: 0 10*3/uL (ref 0.0–0.1)
Basophils Relative: 0.2 % (ref 0.0–3.0)
Eosinophils Absolute: 0.1 10*3/uL (ref 0.0–0.7)
Eosinophils Relative: 1.3 % (ref 0.0–5.0)
HCT: 39.6 % (ref 36.0–46.0)
Hemoglobin: 13.1 g/dL (ref 12.0–15.0)
Lymphocytes Relative: 27.7 % (ref 12.0–46.0)
Lymphs Abs: 2.3 10*3/uL (ref 0.7–4.0)
MCHC: 33.2 g/dL (ref 30.0–36.0)
MCV: 93.6 fl (ref 78.0–100.0)
Monocytes Absolute: 0.6 10*3/uL (ref 0.1–1.0)
Monocytes Relative: 7.3 % (ref 3.0–12.0)
Neutro Abs: 5.3 10*3/uL (ref 1.4–7.7)
Neutrophils Relative %: 63.5 % (ref 43.0–77.0)
Platelets: 274 10*3/uL (ref 150.0–400.0)
RBC: 4.23 Mil/uL (ref 3.87–5.11)
RDW: 12.4 % (ref 11.5–15.5)
WBC: 8.3 10*3/uL (ref 4.0–10.5)

## 2021-06-27 LAB — COMPREHENSIVE METABOLIC PANEL
ALT: 13 U/L (ref 0–35)
AST: 15 U/L (ref 0–37)
Albumin: 4.1 g/dL (ref 3.5–5.2)
Alkaline Phosphatase: 61 U/L (ref 39–117)
BUN: 17 mg/dL (ref 6–23)
CO2: 30 mEq/L (ref 19–32)
Calcium: 9.2 mg/dL (ref 8.4–10.5)
Chloride: 98 mEq/L (ref 96–112)
Creatinine, Ser: 0.7 mg/dL (ref 0.40–1.20)
GFR: 80.5 mL/min (ref >60.00)
Glucose, Bld: 98 mg/dL (ref 70–99)
Potassium: 3.4 mEq/L — ABNORMAL LOW (ref 3.5–5.1)
Sodium: 137 mEq/L (ref 135–145)
Total Bilirubin: 0.6 mg/dL (ref 0.2–1.2)
Total Protein: 6.8 g/dL (ref 6.0–8.3)

## 2021-06-27 LAB — POCT INFLUENZA A/B
Influenza A, POC: NEGATIVE
Influenza B, POC: NEGATIVE

## 2021-06-27 LAB — POC COVID19 BINAXNOW: SARS Coronavirus 2 Ag: NEGATIVE

## 2021-06-27 NOTE — Progress Notes (Signed)
Subjective:    Patient ID: [name], female    DOB: April 21, 1941, 80 y.o.   MRN: [id number]  Chief Complaint  Patient presents with   Follow-up    HPI Patient is in today for recheck from 06/24/21 virtual visit with me (daughter brought her to appt today):   Chief complaint: Productive cough, sneezing  Symptom onset: 06/21/21 Pertinent positives: HA & ST are better, fatigued, body aches Pertinent negatives: Diarrhea, N/V, abdominal pain, CP, SOB, fever, chills Treatments tried: Currently taking amoxicillin, but nothing OTC Vaccine status: UTD with all vaccines including flu and COVID-19  Sick exposure: No known sick contacts   -Started with abdominal pain, then subsided.  -[facility] on 06/22/21. Strep, Flu, COVID-19 tests all negative. She was started on amoxicillin and says she is not feeling any better.  -Now having chest congestion and sneezing.   Today, 06/27/21: -Feels weak today, but feels like she has slightly more energy  -Diarrhea, "everything I eat is running through me" -Coffee and toast this morning, used the bathroom about twice already  -Noticed bright red blood in her stool one time the other day, none since then -Still congested in her nose & coughing that comes and goes -Complains of SOB, worse if she puts a load of laundry in -Still taking amoxicillin twice daily  -Urine is a yellow color, not dark per patient -Dull headache  01/02/21 Colonoscopy with Dr. Carlean Purl - severe diverticulosis  Past Medical History:  Diagnosis Date   Adenomatous colon polyp 2006   Anxiety    Arthritis    Benign esophageal stricture 10/14/2012   CERUMEN IMPACTION, BILATERAL 12/13/2007   CERVICAL RADICULOPATHY 11/05/2009   Chronic kidney disease    kidney stones   DEPRESSION 03/11/2007   DIVERTICULOSIS, COLON 03/11/2007   Esophagitis, reflux 10/14/2012   GERD (gastroesophageal reflux disease) 10/14/2012   Glaucoma    GLAUCOMA ASSOCIATED W/UNSPEC OCULAR DISORDER  11/05/2009   Hearing loss    HEMORRHOIDS, EXTERNAL W/O COMPLICATION 04/26/3845   Qualifier: Diagnosis of  By: Jimmye Norman, LPN, Bonnye M    HYPERLIPIDEMIA 09/22/2007   HYPERTENSION 03/11/2007   Kidney stone    Osteopenia 02/2015   T score -1.4 FRAX 7.3%/0.6%   Rectocele    Rosacea 03/11/2007   Tachycardia     Past Surgical History:  Procedure Laterality Date   ANTERIOR CERVICAL DECOMP/DISCECTOMY FUSION  08/14/2011   Procedure: ANTERIOR CERVICAL DECOMPRESSION/DISCECTOMY FUSION 1 LEVEL/HARDWARE REMOVAL;  Surgeon: [name], MD;  Location: [facility];  Service: Neurosurgery;  Laterality: N/A;  Cervical Six-Seven anterior cervical decompression with fusion interbody prothesis plating and bonegraft with removal of hardware of Cervical Four to Cervical Six    CERVICAL FUSION     C4-6   COLONOSCOPY     multiple   ESOPHAGOGASTRODUODENOSCOPY  10/14/2012   Dr. [name]   KIDNEY SURGERY     age 13   TONSILLECTOMY      Family History  Problem Relation Age of Onset   Mental illness Mother        comitted suicide   Glaucoma Mother    Lung cancer Father        smoker   Hypertension Father    Heart disease Father        age 44, smoker   Cerebral palsy Sister    Stroke Sister        multiple   Diabetes Maternal Grandmother    Stroke Paternal Aunt    Stroke  Paternal Uncle    Stroke Paternal Uncle    Stroke Paternal Uncle    Stroke Paternal Aunt    Pancreatic cancer Daughter    Heart attack Son        41   Heart attack Son        57   Colon cancer Neg Hx    Stomach cancer Neg Hx    Esophageal cancer Neg Hx    Rectal cancer Neg Hx     Social History   Tobacco Use   Smoking status: Never   Smokeless tobacco: Never  Vaping Use   Vaping Use: Never used  Substance Use Topics   Alcohol use: No    Alcohol/week: 0.0 standard drinks   Drug use: No     Allergies  Allergen Reactions   Codeine Nausea Only    Review of Systems NEGATIVE UNLESS OTHERWISE INDICATED IN  HPI      Objective:     BP 132/77   Pulse 89   Temp 98.2 F (36.8 C)   Ht 5\' 1"  (1.549 m)   Wt 116 lb 6.1 oz (52.8 kg)   LMP 08/12/1991   SpO2 95%   BMI 21.99 kg/m   Wt Readings from Last 3 Encounters:  06/27/21 116 lb 6.1 oz (52.8 kg)  05/13/21 119 lb 6.4 oz (54.2 kg)  04/18/21 118 lb 12.8 oz (53.9 kg)    BP Readings from Last 3 Encounters:  06/27/21 132/77  05/13/21 118/62  04/18/21 110/60     Physical Exam Vitals and nursing note reviewed.  Constitutional:      Appearance: Normal appearance. She is normal weight. She is not toxic-appearing.  HENT:     Head: Normocephalic and atraumatic.     Right Ear: Tympanic membrane, ear canal and external ear normal.     Left Ear: Tympanic membrane, ear canal and external ear normal.     Nose: Nose normal.     Mouth/Throat:     Mouth: Mucous membranes are moist.  Eyes:     Extraocular Movements: Extraocular movements intact.     Conjunctiva/sclera: Conjunctivae normal.     Pupils: Pupils are equal, round, and reactive to light.  Cardiovascular:     Rate and Rhythm: Normal rate and regular rhythm.     Pulses: Normal pulses.     Heart sounds: Normal heart sounds.  Pulmonary:     Effort: Pulmonary effort is normal. No respiratory distress.     Breath sounds: Normal breath sounds. No wheezing or rhonchi.     Comments: Some pauses between words to catch her breath Chest:     Chest wall: No tenderness.  Abdominal:     General: Abdomen is flat. Bowel sounds are normal.     Palpations: Abdomen is soft.     Tenderness: There is no abdominal tenderness. There is no right CVA tenderness, left CVA tenderness or guarding.  Musculoskeletal:        General: Normal range of motion.     Cervical back: Normal range of motion and neck supple.  Skin:    General: Skin is warm and dry.  Neurological:     General: No focal deficit present.     Mental Status: She is alert and oriented to person, place, and time.  Psychiatric:         Mood and Affect: Mood normal.        Behavior: Behavior normal.        Thought Content: Thought content  normal.        Judgment: Judgment normal.       Assessment & Plan:   Problem List Items Addressed This Visit   None Visit Diagnoses     Flu-like symptoms    -  Primary   Relevant Orders   POC COVID-19 BinaxNow (Completed)   POCT Influenza A/B (Completed)   CBC with Differential/Platelet (Completed)   Comprehensive metabolic panel (Completed)   GI Profile, Stool, PCR   DG Chest 2 View (Completed)   Productive cough       Relevant Orders   POC COVID-19 BinaxNow (Completed)   POCT Influenza A/B (Completed)   CBC with Differential/Platelet (Completed)   Comprehensive metabolic panel (Completed)   GI Profile, Stool, PCR   DG Chest 2 View (Completed)   Diarrhea, unspecified type       Relevant Orders   POC COVID-19 BinaxNow (Completed)   POCT Influenza A/B (Completed)   CBC with Differential/Platelet (Completed)   Comprehensive metabolic panel (Completed)   GI Profile, Stool, PCR   DG Chest 2 View (Completed)       1. Flu-like symptoms 2. Productive cough 3. Diarrhea, unspecified type -Vital signs stable, but discussed very low threshold for ED if acutely worse; pt and daughter agree -Rechecked POC flu and COVID-19, both of which were negative -Labs, will call with results; stool panel as well -Still thinking this is viral illness; doubt diverticulitis as her abdominal exam is negative -Push fluids, BLAND diet  -Will also check CXR given her ongoing cough and slight dyspnea today -Discussed with PCP Dr. Yong Channel who is agreeable with aforementioned   Randa Evens Mekenna Finau, PA-C

## 2021-06-27 NOTE — Patient Instructions (Signed)
COVID and flu tests negative. Please go to [facility] for chest xray. Labs today, will call with results. Push fluids, bland diet.  ED if acutely worse.

## 2021-06-28 ENCOUNTER — Other Ambulatory Visit: Payer: Medicare Other

## 2021-06-28 DIAGNOSIS — R197 Diarrhea, unspecified: Secondary | ICD-10-CM

## 2021-07-02 ENCOUNTER — Other Ambulatory Visit: Payer: Self-pay

## 2021-07-02 ENCOUNTER — Telehealth: Payer: Self-pay

## 2021-07-02 ENCOUNTER — Telehealth: Payer: Self-pay | Admitting: Internal Medicine

## 2021-07-02 DIAGNOSIS — A0472 Enterocolitis due to Clostridium difficile, not specified as recurrent: Secondary | ICD-10-CM

## 2021-07-02 LAB — GI PROFILE, STOOL, PCR

## 2021-07-02 MED ORDER — DIFICID 200 MG PO TABS: 200.0000 mg | ORAL_TABLET | Freq: Two times a day (BID) | ORAL | 0 refills | Status: DC

## 2021-07-02 MED ORDER — VANCOMYCIN HCL 125 MG PO CAPS: 125.0000 mg | ORAL_CAPSULE | Freq: Four times a day (QID) | ORAL | 0 refills | Status: AC

## 2021-07-02 NOTE — Telephone Encounter (Signed)
I do not think I ordered this but I am her GI physician so I will treat it.  I am copying Dr. Yong Channel who may have ordered it.  Start vancomycin 125 mg 4 times a day x10 days please send in prescription  She should give [place] an update when the treatment is completed.  If she is worsening or has other questions she should call back sooner  When you talk to her make sure she is able to tolerate liquids and stay hydrated etc. because if she is severely ill she would need to go to the hospital

## 2021-07-02 NOTE — Telephone Encounter (Signed)
Pt made aware of results and Dr.Gessner recommendations.  Prescription sent to pharmacy. Pt made aware Pt verbalized understanding with all questions answered.

## 2021-07-02 NOTE — Telephone Encounter (Signed)
Pharmacy was notified about the pt medication. Kim from the pharmacy stated that she will cancel that prescription.

## 2021-07-02 NOTE — Telephone Encounter (Signed)
Try Dificid 200 mg bid x 10 days

## 2021-07-02 NOTE — Telephone Encounter (Signed)
Talma from [employer] notified office that pt tested POSITIVE  for C Diff.  Specimen collected on 06/28/2021 Please advise

## 2021-07-02 NOTE — Telephone Encounter (Signed)
Pt notified of the different prescription that was sent in.  Pt states that she just filled the Vancomycin prescription and is on the way home.  Pt states that her daughter wanted the best for her.  Pt states that she has been sick for a while and wants the best medication.  Pt notified to contact us after completing the vancomycin   Pt verbalized understanding with all questions answered.

## 2021-07-02 NOTE — Telephone Encounter (Signed)
OK please update the med list and notify pharmacy about no Dificid

## 2021-07-02 NOTE — Telephone Encounter (Signed)
Left message for pt to call back  °

## 2021-07-11 DIAGNOSIS — Z8739 Personal history of other diseases of the musculoskeletal system and connective tissue: Secondary | ICD-10-CM

## 2021-07-11 HISTORY — DX: Personal history of other diseases of the musculoskeletal system and connective tissue: Z87.39

## 2021-07-14 ENCOUNTER — Other Ambulatory Visit: Payer: Self-pay | Admitting: Family Medicine

## 2021-07-15 ENCOUNTER — Other Ambulatory Visit: Payer: Self-pay

## 2021-07-15 ENCOUNTER — Telehealth: Payer: Self-pay | Admitting: Internal Medicine

## 2021-07-15 ENCOUNTER — Telehealth: Payer: Self-pay

## 2021-07-15 DIAGNOSIS — A0472 Enterocolitis due to Clostridium difficile, not specified as recurrent: Secondary | ICD-10-CM

## 2021-07-15 MED ORDER — DIFICID 200 MG PO TABS: 200.0000 mg | ORAL_TABLET | Freq: Two times a day (BID) | ORAL | 0 refills | Status: DC

## 2021-07-15 NOTE — Telephone Encounter (Signed)
Unfortunately coughs can linger for weeks. Please try to reassure to give this time, even though I know it is frustrating.

## 2021-07-15 NOTE — Telephone Encounter (Signed)
Team can we get her in with me or one of my colleagues for cough please?

## 2021-07-15 NOTE — Telephone Encounter (Signed)
Pt recently treated for Cdiff. Pt finished antibiotics yesterday. (Vancomycin) Pt states that she is still having 6-12 liquid BMs a day with episodes of incontinence with these stools. Pt states that she has experienced Incontinence for the last three weeks. Pt complains of a persistent cough as well. Pt encouraged to reach out to her PCP for the persistent cough.  Please advise on the frequent BM

## 2021-07-15 NOTE — Telephone Encounter (Signed)
Pt called stating that she saw [name] on 11/14 and 11/17. Pt stated that she is still having the cough and wants to know what she should do. Pt was prescribed Benzonatate by [name]. She also stated that she has been taking cough syrup prescribed by the urgent care. Pt wants refill of syrup or something else to help with the cough. Please Advise.

## 2021-07-15 NOTE — Telephone Encounter (Signed)
Patient called today.  She was instructed to call back after she finished taking her medication to let you know how she's doing.  She is not well.  She is still having the diarrhea and has also developed a persistent cough as well.  She would like to have a call back to see how she should proceed.  Please give her a call.  Thank you.

## 2021-07-15 NOTE — Telephone Encounter (Signed)
Prescription sent to pharmacy. Pt made aware. Follow up Visit scheduled for Amy Westchase PA 07/29/2021 @ 10:00. Pt made aware.  Pt verbalized understanding with all questions answered.

## 2021-07-15 NOTE — Telephone Encounter (Signed)
I spoke to her - she did have improvement some with vancomycin Rx but not resolved  She is hydrating well she says  Having a cough as noted and needs PCP help  Plan:  Dificid (fidoxamicin) 200 mg bid x 10 days  Continue hydration  Agree contact PCP re: respiratory sxs  F/U me or an APP 10-14 days

## 2021-07-16 ENCOUNTER — Ambulatory Visit (INDEPENDENT_AMBULATORY_CARE_PROVIDER_SITE_OTHER)
Admission: RE | Admit: 2021-07-16 | Discharge: 2021-07-16 | Disposition: A | Discharge status: Home / Self Care | Payer: Medicare Other | Source: Ambulatory Visit | Attending: Family Medicine | Admitting: Family Medicine

## 2021-07-16 ENCOUNTER — Encounter: Payer: Self-pay | Admitting: Family Medicine

## 2021-07-16 ENCOUNTER — Other Ambulatory Visit: Payer: Self-pay

## 2021-07-16 ENCOUNTER — Ambulatory Visit (INDEPENDENT_AMBULATORY_CARE_PROVIDER_SITE_OTHER): Payer: Medicare Other | Admitting: Family Medicine

## 2021-07-16 VITALS — BP 122/80 | HR 88 | Temp 98.7°F | Ht 61.0 in | Wt 115.6 lb

## 2021-07-16 DIAGNOSIS — I10 Essential (primary) hypertension: Secondary | ICD-10-CM

## 2021-07-16 DIAGNOSIS — R052 Subacute cough: Secondary | ICD-10-CM

## 2021-07-16 DIAGNOSIS — K219 Gastro-esophageal reflux disease without esophagitis: Secondary | ICD-10-CM | POA: Diagnosis not present

## 2021-07-16 MED ORDER — PANTOPRAZOLE SODIUM 20 MG PO TBEC: 20.0000 mg | DELAYED_RELEASE_TABLET | Freq: Two times a day (BID) | ORAL | 0 refills | Status: DC

## 2021-07-16 MED ORDER — ALBUTEROL SULFATE HFA 108 (90 BASE) MCG/ACT IN AERS: 2.0000 | INHALATION_SPRAY | Freq: Four times a day (QID) | RESPIRATORY_TRACT | 0 refills | Status: DC | PRN

## 2021-07-16 MED ORDER — TRAMADOL HCL 50 MG PO TABS: 25.0000 mg | ORAL_TABLET | Freq: Three times a day (TID) | ORAL | 0 refills | Status: DC | PRN

## 2021-07-16 MED ORDER — FLUTICASONE PROPIONATE 50 MCG/ACT NA SUSP: 2.0000 | Freq: Every day | NASAL | 0 refills | Status: DC

## 2021-07-16 NOTE — Telephone Encounter (Signed)
Pt is scheduled for today  °

## 2021-07-16 NOTE — Progress Notes (Signed)
Phone [phone] In person visit   Subjective:   [name] is a 80 y.o. year old very pleasant female patient who presents for/with See problem oriented charting Chief Complaint  Patient presents with   Follow-up   Cough    Pt c/o cough for 3 weeks and chest congestion along with headache. Pt states antibiotic did not help.    This visit occurred during the SARS-CoV-2 public health emergency.  Safety protocols were in place, including screening questions prior to the visit, additional usage of staff PPE, and extensive cleaning of exam room while observing appropriate contact time as indicated for disinfecting solutions.   Past Medical History-  Patient Active Problem List   Diagnosis Date Noted   Diabetes mellitus without complication ([place]) 23/76/2831    Priority: High   History of supraventricular tachycardia 08/14/2011    Priority: High   Aortic atherosclerosis ([place]) 07/21/2019    Priority: Medium    GERD (gastroesophageal reflux disease) 07/14/2014    Priority: Medium    Primary open angle glaucoma of both eyes, mild stage 05/29/2014    Priority: Medium    IBS (irritable bowel syndrome)- diarrhea predominant 01/26/2013    Priority: Medium    Glaucoma associated with ocular disorder 11/05/2009    Priority: Medium    Hyperlipidemia associated with type 2 diabetes mellitus ([place]) 09/22/2007    Priority: Medium    Major depression in full remission ([place])- Lexapro 20mg . Xanax once a week or less. 03/11/2007    Priority: Medium    Hypertension associated with diabetes ([place]) 03/11/2007    Priority: Medium    Sensorineural hearing loss (SNHL), bilateral 04/06/2020    Priority: Low   History of colonic polyps 11/28/2011    Priority: Low   Senile nuclear sclerosis 06/29/2011    Priority: Low   Cervical disc disorder with radiculopathy of cervical region 11/05/2009    Priority: Low   Rosacea 03/11/2007    Priority: Low   Vertigo 04/06/2020   Knee pain, left 11/02/2013     Medications- reviewed and updated Current Outpatient Medications  Medication Sig Dispense Refill   acetaminophen (TYLENOL) 325 MG tablet Take 650 mg by mouth every 6 (six) hours as needed for moderate pain.     albuterol (VENTOLIN HFA) 108 (90 Base) MCG/ACT inhaler Inhale 2 puffs into the lungs every 6 (six) hours as needed for wheezing or shortness of breath (or cough). 1 each 0   ALPRAZolam (XANAX) 0.25 MG tablet TAKE 1 TABLET (0.25 MG TOTAL) BY MOUTH DAILY AS NEEDED. FOR ANXIETY 30 tablet 5   atorvastatin (LIPITOR) 20 MG tablet Take 1 tablet (20 mg total) by mouth 3 (three) times a week. 30 tablet 3   Calcium Carbonate-Vitamin D (CALCIUM + D PO) Take 1 tablet by mouth daily.     diltiazem (CARDIZEM CD) 240 MG 24 hr capsule TAKE 1 CAPSULE BY MOUTH EVERY DAY 90 capsule 3   dorzolamide-timolol (COSOPT) 22.3-6.8 MG/ML ophthalmic solution Place 1 drop into both eyes 2 (two) times daily.     escitalopram (LEXAPRO) 20 MG tablet TAKE 1 TABLET BY MOUTH EVERY DAY 90 tablet 3   fidaxomicin (DIFICID) 200 MG TABS tablet Take 1 tablet (200 mg total) by mouth 2 (two) times daily for 10 days. 20 tablet 0   fluticasone (FLONASE) 50 MCG/ACT nasal spray Place 2 sprays into both nostrils daily. 16 g 0   hydrochlorothiazide (MICROZIDE) 12.5 MG capsule TAKE 1 CAPSULE BY MOUTH EVERY DAY 90 capsule 3  meclizine (ANTIVERT) 25 MG tablet Take 1 tablet (25 mg total) by mouth 3 (three) times daily as needed for dizziness or nausea (vertigo). 30 tablet 0   ondansetron (ZOFRAN) 4 MG tablet Take 1 tablet (4 mg total) by mouth every 8 (eight) hours as needed for nausea or vomiting. 20 tablet 0   traMADol (ULTRAM) 50 MG tablet Take 0.5-1 tablets (25-50 mg total) by mouth every 8 (eight) hours as needed for up to 5 days (cough. codeine allergy so avoiding codeine coug syrups.). 15 tablet 0   valsartan (DIOVAN) 320 MG tablet TAKE 1 TABLET BY MOUTH EVERY DAY 90 tablet 3   pantoprazole (PROTONIX) 20 MG tablet Take 1 tablet  (20 mg total) by mouth 2 (two) times daily. 60 tablet 0   No current facility-administered medications for this visit.     Objective:  BP 122/80   Pulse 88   Temp 98.7 F (37.1 C)   Ht 5\' 1"  (1.549 m)   Wt 115 lb 9.6 oz (52.4 kg)   LMP 08/12/1991   SpO2 97%   BMI 21.84 kg/m  Gen: NAD, appears fatigued Nasal turbinates slightly swollen, pharynx with some cobblestoning and mild erythema, tympanic membranes normal bilaterally CV: RRR no murmurs rubs or gallops Lungs: CTAB no crackles, wheeze, rhonchi Ext: no edema Skin: warm, dry     Assessment and Plan   # Subacute Cough #C. difficile colitis S:Patient initially presented to [facility] on 06/24/2021 through video visit for productive cough and sneezing  Onset of symptoms started on 06/21/2021. Patient was also seen on 06/22/2021 at [facility] were strep, flu, and COVID-19 testes were administered during the encounter - all results negative. Patient was started on amoxicillin through urgent care but reported not feeling any better- this was for augmentin .   Patient was seen on 06/24/2021 virtually and in person on 06/27/2021 - Alyssa was concerned about possible false negative testing performed at urgent care-repeat point-of-care flu and COVID test were negative - given Tessalon perles for cough - did not help much -Patient also had chest x-ray on 06/27/2021 with "no active cardiopulmonary disease" -Labs with CBC and CMP also done-only slightly low potassium -Smoking history-never smoker  -Patient also complained of diarrhea-ultimately found to have C. difficile-Patient was also prescribed vancomycin intially and improved without full relief then later prescribed  Dificid 200 mg BID for 10 days for  Clostridioides difficile infection (she has to pick this up to day- sent in yesterdaY per Dr. Carlean Purl 07/02/2021.  Did have short-term blood in the stool but this resolved.  She thinks diarrhea predated the  amoxicillin prescription  Today patient reports over last 2 weeks no improvement in cough- she states this is really bothering her- even worse than the diarrhea had been. She feels fatigued. She is not clearly having shortness of breath with activity. Upper ches tpain with coughing spells. Coughing spells cause her to need to have BM. Mild headache. Some sinus congestion. She did finisht he amoxicillin course. Not wheezing  Still taking her pantoprazole.  A/P: 80 year old female with persistent cough for nearly a month now.  Did have prior chest x-ray about 3 weeks ago we opted to update chest x-ray  with persistent symptoms to evaluate for pneumonia- perhaps dehydrated at prior x-ray or early in pneumonia course  If there is no pneumonia present certainly possible bronchitis at play here-often would use prednisone for postviral cough also but has potential to worsen C. difficile so we opted  to trial aggressive cough dampening/depression -trial albuterol -short term increase reflux medicine pantoprazole to twice a day -trial floase in case allergy element -Trial tramadol-we will try codeine cough syrup with failure of Tessalon but has codeine allergy-could have potential overlap with current medication to unfortunately -Previously noted to have some shortness of breath but she denies this today-just describes an overall tiredness from persistent cough.  Chest pain only with coughing-doubt cardiac disease  #hypertension/history of SVT S: medication: patient compliant with cardizem 240 mg daily and Diovan 320 mg daily with hydrochlorothiazide 12.5 mg BP Readings from Last 3 Encounters:  07/16/21 122/80  06/27/21 132/77  05/13/21 118/62  A/P: Stable hypertension.  No evidence of recurrent of SVT  # GERD S:Medication: protonix 20 mg daily - 40 mg in the past. No recurrence of symptoms A/P: No clear evidence of worsening reflux but cough can often be worsened by reflux we opted to trial twice  daily Protonix just for the next month-then can revert to once daily   Recommended follow up: Return for any new or worsening symptoms. Future Appointments  Date Time Provider [place]  07/29/2021 10:00 AM Leotis Pain [facility]  08/16/2021  9:20 AM Yong Channel Brayton Mars, MD [facility]    Lab/Order associations:   ICD-10-CM   1. Subacute cough  R05.2 DG Chest 2 View    2. Essential hypertension  I10     3. Gastroesophageal reflux disease without esophagitis  K21.9       Meds ordered this encounter  Medications   albuterol (VENTOLIN HFA) 108 (90 Base) MCG/ACT inhaler    Sig: Inhale 2 puffs into the lungs every 6 (six) hours as needed for wheezing or shortness of breath (or cough).    Dispense:  1 each    Refill:  0   pantoprazole (PROTONIX) 20 MG tablet    Sig: Take 1 tablet (20 mg total) by mouth 2 (two) times daily.    Dispense:  60 tablet    Refill:  0   fluticasone (FLONASE) 50 MCG/ACT nasal spray    Sig: Place 2 sprays into both nostrils daily.    Dispense:  16 g    Refill:  0   traMADol (ULTRAM) 50 MG tablet    Sig: Take 0.5-1 tablets (25-50 mg total) by mouth every 8 (eight) hours as needed for up to 5 days (cough. codeine allergy so avoiding codeine coug syrups.).    Dispense:  15 tablet    Refill:  0   I,Harris Phan,acting as a scribe for Garret Reddish, MD.,have documented all relevant documentation on the behalf of Garret Reddish, MD,as directed by  Garret Reddish, MD while in the presence of Garret Reddish, MD.   I, Garret Reddish, MD, have reviewed all documentation for this visit. The documentation on 07/16/21 for the exam, diagnosis, procedures, and orders are all accurate and complete.   Return precautions advised.  Garret Reddish, MD

## 2021-07-16 NOTE — Patient Instructions (Addendum)
Please go to [facility] (updated 10/06/2019) - located [address] across the [address] from [place] - in the basement - Hours: 8:30-5:00 PM M-F (with lunch from 12:30- 1 PM). You do NOT need an appointment.  - Please ensure you are covid symptom free before going in(No fever, chills, cough, congestion, runny nose, shortness of breath, fatigue, body aches, sore throat, headache, nausea, vomiting, diarrhea, or new loss of taste or smell. No known contacts with covid 19 or someone being tested for covid 19)   You may wait on these therapies until we get X-ray results! Hoping to have x-rays to you by tomorrow if you go now  Take pantoprazole short term for twice a day for one month.  Trial albuterol inhaler and journal whether it is helping your symptoms or not and let me know within the next 2 weeks.  Start flonase nasal spray- 2 sprays per each nostril.  Trial half tablet of tramadol for every 6 hours for your cough. If this medication causes any side effects for you, please stop taking it immediately. Do not take alprazolam while on this medication!  Recommended follow up: Return for any new or worsening symptoms. Update me in a week regardless-I want to know how you are doing

## 2021-07-16 NOTE — Telephone Encounter (Signed)
Patient has a appt with Dr. Yong Channel

## 2021-07-17 ENCOUNTER — Telehealth: Payer: Self-pay

## 2021-07-17 NOTE — Telephone Encounter (Signed)
Pt called for X-ray results. Please Advise.

## 2021-07-17 NOTE — Telephone Encounter (Signed)
Called and spoke with pt and results reviewed. 

## 2021-07-20 ENCOUNTER — Inpatient Hospital Stay (HOSPITAL_COMMUNITY)
Admission: EM | Admit: 2021-07-20 | Discharge: 2021-07-31 | DRG: 871 | Disposition: A | Discharge status: Skilled Nursing Facility | Payer: Medicare Other | Attending: Internal Medicine | Admitting: Internal Medicine

## 2021-07-20 ENCOUNTER — Other Ambulatory Visit: Payer: Self-pay

## 2021-07-20 ENCOUNTER — Encounter (HOSPITAL_COMMUNITY): Payer: Self-pay | Admitting: Emergency Medicine

## 2021-07-20 ENCOUNTER — Emergency Department (HOSPITAL_COMMUNITY): Payer: Medicare Other

## 2021-07-20 DIAGNOSIS — T360X5A Adverse effect of penicillins, initial encounter: Secondary | ICD-10-CM | POA: Diagnosis present

## 2021-07-20 DIAGNOSIS — Z86010 Personal history of colonic polyps: Secondary | ICD-10-CM

## 2021-07-20 DIAGNOSIS — Z83511 Family history of glaucoma: Secondary | ICD-10-CM

## 2021-07-20 DIAGNOSIS — J9601 Acute respiratory failure with hypoxia: Secondary | ICD-10-CM | POA: Diagnosis not present

## 2021-07-20 DIAGNOSIS — E119 Type 2 diabetes mellitus without complications: Secondary | ICD-10-CM

## 2021-07-20 DIAGNOSIS — J9811 Atelectasis: Secondary | ICD-10-CM | POA: Diagnosis present

## 2021-07-20 DIAGNOSIS — N179 Acute kidney failure, unspecified: Secondary | ICD-10-CM | POA: Diagnosis present

## 2021-07-20 DIAGNOSIS — B370 Candidal stomatitis: Secondary | ICD-10-CM | POA: Diagnosis not present

## 2021-07-20 DIAGNOSIS — A0472 Enterocolitis due to Clostridium difficile, not specified as recurrent: Secondary | ICD-10-CM | POA: Diagnosis present

## 2021-07-20 DIAGNOSIS — Z981 Arthrodesis status: Secondary | ICD-10-CM

## 2021-07-20 DIAGNOSIS — A0471 Enterocolitis due to Clostridium difficile, recurrent: Secondary | ICD-10-CM | POA: Diagnosis not present

## 2021-07-20 DIAGNOSIS — Z823 Family history of stroke: Secondary | ICD-10-CM

## 2021-07-20 DIAGNOSIS — E1169 Type 2 diabetes mellitus with other specified complication: Secondary | ICD-10-CM | POA: Diagnosis present

## 2021-07-20 DIAGNOSIS — I152 Hypertension secondary to endocrine disorders: Secondary | ICD-10-CM | POA: Diagnosis present

## 2021-07-20 DIAGNOSIS — N182 Chronic kidney disease, stage 2 (mild): Secondary | ICD-10-CM

## 2021-07-20 DIAGNOSIS — I10 Essential (primary) hypertension: Secondary | ICD-10-CM | POA: Diagnosis present

## 2021-07-20 DIAGNOSIS — E876 Hypokalemia: Secondary | ICD-10-CM

## 2021-07-20 DIAGNOSIS — T364X5A Adverse effect of tetracyclines, initial encounter: Secondary | ICD-10-CM | POA: Diagnosis not present

## 2021-07-20 DIAGNOSIS — D649 Anemia, unspecified: Secondary | ICD-10-CM | POA: Diagnosis not present

## 2021-07-20 DIAGNOSIS — E8779 Other fluid overload: Secondary | ICD-10-CM | POA: Diagnosis present

## 2021-07-20 DIAGNOSIS — A419 Sepsis, unspecified organism: Secondary | ICD-10-CM | POA: Diagnosis not present

## 2021-07-20 DIAGNOSIS — F419 Anxiety disorder, unspecified: Secondary | ICD-10-CM | POA: Diagnosis present

## 2021-07-20 DIAGNOSIS — A414 Sepsis due to anaerobes: Principal | ICD-10-CM | POA: Diagnosis present

## 2021-07-20 DIAGNOSIS — M79673 Pain in unspecified foot: Secondary | ICD-10-CM

## 2021-07-20 DIAGNOSIS — H409 Unspecified glaucoma: Secondary | ICD-10-CM | POA: Diagnosis present

## 2021-07-20 DIAGNOSIS — M109 Gout, unspecified: Secondary | ICD-10-CM | POA: Diagnosis present

## 2021-07-20 DIAGNOSIS — Z833 Family history of diabetes mellitus: Secondary | ICD-10-CM

## 2021-07-20 DIAGNOSIS — Z801 Family history of malignant neoplasm of trachea, bronchus and lung: Secondary | ICD-10-CM

## 2021-07-20 DIAGNOSIS — Z8249 Family history of ischemic heart disease and other diseases of the circulatory system: Secondary | ICD-10-CM

## 2021-07-20 DIAGNOSIS — E785 Hyperlipidemia, unspecified: Secondary | ICD-10-CM | POA: Diagnosis present

## 2021-07-20 DIAGNOSIS — I493 Ventricular premature depolarization: Secondary | ICD-10-CM | POA: Diagnosis not present

## 2021-07-20 DIAGNOSIS — K219 Gastro-esophageal reflux disease without esophagitis: Secondary | ICD-10-CM | POA: Diagnosis present

## 2021-07-20 DIAGNOSIS — M25469 Effusion, unspecified knee: Secondary | ICD-10-CM

## 2021-07-20 DIAGNOSIS — M79605 Pain in left leg: Secondary | ICD-10-CM | POA: Diagnosis not present

## 2021-07-20 DIAGNOSIS — F32A Depression, unspecified: Secondary | ICD-10-CM | POA: Diagnosis present

## 2021-07-20 DIAGNOSIS — Z20822 Contact with and (suspected) exposure to covid-19: Secondary | ICD-10-CM | POA: Diagnosis present

## 2021-07-20 DIAGNOSIS — E1122 Type 2 diabetes mellitus with diabetic chronic kidney disease: Secondary | ICD-10-CM | POA: Diagnosis present

## 2021-07-20 DIAGNOSIS — Z885 Allergy status to narcotic agent status: Secondary | ICD-10-CM

## 2021-07-20 DIAGNOSIS — E871 Hypo-osmolality and hyponatremia: Secondary | ICD-10-CM | POA: Diagnosis present

## 2021-07-20 DIAGNOSIS — R0602 Shortness of breath: Secondary | ICD-10-CM

## 2021-07-20 DIAGNOSIS — R531 Weakness: Secondary | ICD-10-CM

## 2021-07-20 DIAGNOSIS — J180 Bronchopneumonia, unspecified organism: Secondary | ICD-10-CM | POA: Diagnosis present

## 2021-07-20 DIAGNOSIS — Z800 Family history of malignant neoplasm of digestive organs: Secondary | ICD-10-CM

## 2021-07-20 DIAGNOSIS — J810 Acute pulmonary edema: Secondary | ICD-10-CM | POA: Diagnosis not present

## 2021-07-20 DIAGNOSIS — E1159 Type 2 diabetes mellitus with other circulatory complications: Secondary | ICD-10-CM | POA: Diagnosis not present

## 2021-07-20 DIAGNOSIS — Z87442 Personal history of urinary calculi: Secondary | ICD-10-CM

## 2021-07-20 DIAGNOSIS — Z79899 Other long term (current) drug therapy: Secondary | ICD-10-CM

## 2021-07-20 LAB — COMPREHENSIVE METABOLIC PANEL
ALT: 14 U/L (ref 0–44)
AST: 20 U/L (ref 15–41)
Albumin: 3.4 g/dL — ABNORMAL LOW (ref 3.5–5.0)
Alkaline Phosphatase: 54 U/L (ref 38–126)
Anion gap: 12 (ref 5–15)
BUN: 9 mg/dL (ref 8–23)
CO2: 30 mmol/L (ref 22–32)
Calcium: 8.6 mg/dL — ABNORMAL LOW (ref 8.9–10.3)
Chloride: 91 mmol/L — ABNORMAL LOW (ref 98–111)
Creatinine, Ser: 0.69 mg/dL (ref 0.44–1.00)
GFR, Estimated: >60 mL/min (ref >60)
Glucose, Bld: 209 mg/dL — ABNORMAL HIGH (ref 70–99)
Potassium: 2.4 mmol/L — CL (ref 3.5–5.1)
Sodium: 133 mmol/L — ABNORMAL LOW (ref 135–145)
Total Bilirubin: 0.8 mg/dL (ref 0.3–1.2)
Total Protein: 6.9 g/dL (ref 6.5–8.1)

## 2021-07-20 LAB — CBC
HCT: 37.9 % (ref 36.0–46.0)
Hemoglobin: 12.3 g/dL (ref 12.0–15.0)
MCH: 30.7 pg (ref 26.0–34.0)
MCHC: 32.5 g/dL (ref 30.0–36.0)
MCV: 94.5 fL (ref 80.0–100.0)
Platelets: 347 10*3/uL (ref 150–400)
RBC: 4.01 MIL/uL (ref 3.87–5.11)
RDW: 12.3 % (ref 11.5–15.5)
WBC: 12 10*3/uL — ABNORMAL HIGH (ref 4.0–10.5)
nRBC: 0 % (ref 0.0–0.2)

## 2021-07-20 LAB — URINALYSIS, ROUTINE W REFLEX MICROSCOPIC
Bilirubin Urine: NEGATIVE
Glucose, UA: NEGATIVE mg/dL
Ketones, ur: NEGATIVE mg/dL
Nitrite: NEGATIVE
Protein, ur: NEGATIVE mg/dL
Specific Gravity, Urine: 1.011 (ref 1.005–1.030)
pH: 5 (ref 5.0–8.0)

## 2021-07-20 LAB — RESP PANEL BY RT-PCR (FLU A&B, COVID) ARPGX2
Influenza A by PCR: NEGATIVE
Influenza B by PCR: NEGATIVE
SARS Coronavirus 2 by RT PCR: NEGATIVE

## 2021-07-20 LAB — BRAIN NATRIURETIC PEPTIDE: B Natriuretic Peptide: 109.1 pg/mL — ABNORMAL HIGH (ref 0.0–100.0)

## 2021-07-20 LAB — TROPONIN I (HIGH SENSITIVITY): Troponin I (High Sensitivity): 23 ng/L — ABNORMAL HIGH (ref <18)

## 2021-07-20 LAB — MAGNESIUM: Magnesium: 1.6 mg/dL — ABNORMAL LOW (ref 1.7–2.4)

## 2021-07-20 MED ORDER — SODIUM CHLORIDE 0.9 % IV BOLUS
1000.0000 mL | Freq: Once | INTRAVENOUS | Status: AC
  Administered 2021-07-20: 1000 mL via INTRAVENOUS

## 2021-07-20 MED ORDER — MAGNESIUM SULFATE 2 GM/50ML IV SOLN
2.0000 g | Freq: Once | INTRAVENOUS | Status: AC
  Administered 2021-07-20: 2 g via INTRAVENOUS
  Filled 2021-07-20: qty 50

## 2021-07-20 MED ORDER — METOPROLOL TARTRATE 5 MG/5ML IV SOLN
5.0000 mg | INTRAVENOUS | Status: DC | PRN
  Administered 2021-07-20: 5 mg via INTRAVENOUS
  Filled 2021-07-20 (×2): qty 5

## 2021-07-20 MED ORDER — FIDAXOMICIN 200 MG PO TABS
200.0000 mg | ORAL_TABLET | Freq: Two times a day (BID) | ORAL | Status: AC
  Administered 2021-07-20 – 2021-07-30 (×20): 200 mg via ORAL
  Filled 2021-07-20 (×20): qty 1

## 2021-07-20 MED ORDER — POTASSIUM CHLORIDE 10 MEQ/100ML IV SOLN
10.0000 meq | INTRAVENOUS | Status: AC
  Administered 2021-07-20 (×2): 10 meq via INTRAVENOUS
  Filled 2021-07-20 (×2): qty 100

## 2021-07-20 MED ORDER — POTASSIUM CHLORIDE 10 MEQ/100ML IV SOLN
10.0000 meq | INTRAVENOUS | Status: AC
  Administered 2021-07-20 (×4): 10 meq via INTRAVENOUS
  Filled 2021-07-20 (×4): qty 100

## 2021-07-20 MED ORDER — POTASSIUM CHLORIDE CRYS ER 20 MEQ PO TBCR
40.0000 meq | EXTENDED_RELEASE_TABLET | Freq: Two times a day (BID) | ORAL | Status: DC
  Administered 2021-07-20 – 2021-07-25 (×10): 40 meq via ORAL
  Filled 2021-07-20 (×10): qty 2

## 2021-07-20 NOTE — ED Notes (Signed)
Pt placed on bedpan

## 2021-07-20 NOTE — ED Provider Notes (Signed)
Emergency Medicine Provider Triage Evaluation Note  MAYCI HANING , a 80 y.o. female  was evaluated in triage.  Pt complains of being sick for the past 4 weeks.  Saw her primary care provider 12/6 and an x-ray was performed which was negative.  Had a recent diagnosis of C. difficile for which she was prescribed amoxicillin.  Reports she is still having diarrhea.  Review of Systems  Positive: Cough, sob Negative: Leg swelling, chest pain  Physical Exam  BP (!) 142/69 (BP Location: Left Arm)   Pulse 98   Temp 98.1 F (36.7 C) (Oral)   Resp 16   LMP 08/12/1991   SpO2 94%  Gen:   Awake, no distress   Resp:  Normal effort  MSK:   Moves extremities without difficulty  Other:  Wheezing and rales noted in all lung fields.  Coughs with each expiration.  Medical Decision Making  Medically screening exam initiated at 11:27 AM.  Appropriate orders placed.  [name] was informed that the remainder of the evaluation will be completed by another provider, this initial triage assessment does not replace that evaluation, and the importance of remaining in the ED until their evaluation is complete.     Darliss Ridgel 07/20/21 1130    Wyvonnia Dusky, MD 07/20/21 [phone]

## 2021-07-20 NOTE — ED Triage Notes (Signed)
Complains of weakness and fatigue for the past few weeks, unable to give a full hx, states she is too sick. Reports a cough. Negative flu and Covid tests last week as well.

## 2021-07-20 NOTE — H&P (Signed)
History and Physical    ALABAMA DOIG NWG:[id number] DOB: 01-May-1941 DOA: 07/20/2021  PCP: [name], MD  Patient coming from: Home  Chief Complaint: cough, diarrhea  HPI: [name] is a 80 y.o. female with medical history significant of GERD, recent c diff infection, DM2 diet controlled, HLD, HTN, SVT. Presenting with cough and diarrhea. She had an ear infection 4 weeks ago. At the time, she had other URI symptoms, so she was placed on amoxicillin for 10 days. After completion of that abx, she complained of diarrhea. She was tested and found to be C diff positive. She was prescribed PO vanc. She completed a 10 day course, but still had diarrhea. She also felt extremely weak. She continued to have shortness of breath and cough. She spoke with her GI doc and dificid was prescribed, however, she did not start it yet. She continued to be weak this morning, so she decided to come to the ED.   ED Course: CXR showed b/l lower lobe opacities. She was noted to have hypoK+/Mg2+. She was started on K+/Mg2+. She was started on fluids. TR[facility] was called for admission.   Review of Systems: Review of systems is otherwise negative for all not mentioned in HPI.   PMHx Past Medical History:  Diagnosis Date   Adenomatous colon polyp 2006   Anxiety    Arthritis    Benign esophageal stricture 10/14/2012   CERUMEN IMPACTION, BILATERAL 12/13/2007   CERVICAL RADICULOPATHY 11/05/2009   Chronic kidney disease    kidney stones   DEPRESSION 03/11/2007   DIVERTICULOSIS, COLON 03/11/2007   Esophagitis, reflux 10/14/2012   GERD (gastroesophageal reflux disease) 10/14/2012   Glaucoma    GLAUCOMA ASSOCIATED W/UNSPEC OCULAR DISORDER 11/05/2009   Hearing loss    HEMORRHOIDS, EXTERNAL W/O COMPLICATION 5/78/4696   Qualifier: Diagnosis of  By: Jimmye Norman, LPN, Bonnye M    HYPERLIPIDEMIA 09/22/2007   HYPERTENSION 03/11/2007   Kidney stone    Osteopenia 02/2015   T score -1.4 FRAX 7.3%/0.6%   Rectocele    Rosacea  03/11/2007   Tachycardia     PSHx Past Surgical History:  Procedure Laterality Date   ANTERIOR CERVICAL DECOMP/DISCECTOMY FUSION  08/14/2011   Procedure: ANTERIOR CERVICAL DECOMPRESSION/DISCECTOMY FUSION 1 LEVEL/HARDWARE REMOVAL;  Surgeon: [name], MD;  Location: [facility];  Service: Neurosurgery;  Laterality: N/A;  Cervical Six-Seven anterior cervical decompression with fusion interbody prothesis plating and bonegraft with removal of hardware of Cervical Four to Cervical Six    CERVICAL FUSION     C4-6   COLONOSCOPY     multiple   ESOPHAGOGASTRODUODENOSCOPY  10/14/2012   Dr. [name]   KIDNEY SURGERY     age 18   TONSILLECTOMY      SocHx  reports that she has never smoked. She has never used smokeless tobacco. She reports that she does not drink alcohol and does not use drugs.  Allergies  Allergen Reactions   Codeine Nausea Only    FamHx Family History  Problem Relation Age of Onset   Mental illness Mother        comitted suicide   Glaucoma Mother    Lung cancer Father        smoker   Hypertension Father    Heart disease Father        age 82, smoker   Cerebral palsy Sister    Stroke Sister        multiple   Diabetes Maternal Grandmother  Stroke Paternal Aunt    Stroke Paternal Uncle    Stroke Paternal Uncle    Stroke Paternal Uncle    Stroke Paternal Aunt    Pancreatic cancer Daughter    Heart attack Son        67   Heart attack Son        32   Colon cancer Neg Hx    Stomach cancer Neg Hx    Esophageal cancer Neg Hx    Rectal cancer Neg Hx     Prior to Admission medications   Medication Sig Start Date End Date Taking? Authorizing Provider  acetaminophen (TYLENOL) 325 MG tablet Take 650 mg by mouth every 6 (six) hours as needed for moderate pain.   Yes [provider]  albuterol (VENTOLIN HFA) 108 (90 Base) MCG/ACT inhaler Inhale 2 puffs into the lungs every 6 (six) hours as needed for wheezing or shortness of breath (or cough).  07/16/21  Yes [name], MD  ALPRAZolam Duanne Moron) 0.25 MG tablet TAKE 1 TABLET (0.25 MG TOTAL) BY MOUTH DAILY AS NEEDED. FOR ANXIETY Patient taking differently: Take 0.25 mg by mouth daily as needed for anxiety. 11/22/20  Yes [name], MD  benzonatate (TESSALON) 100 MG capsule Take 100 mg by mouth 2 (two) times daily as needed for cough.   Yes [provider]  Calcium Carbonate-Vitamin D (CALCIUM + D PO) Take 1 tablet by mouth daily.   Yes [provider]  diltiazem (CARDIZEM CD) 240 MG 24 hr capsule TAKE 1 CAPSULE BY MOUTH EVERY DAY Patient taking differently: 240 mg daily. 06/07/21  Yes [name], MD  dorzolamide-timolol (COSOPT) 22.3-6.8 MG/ML ophthalmic solution Place 1 drop into both eyes 2 (two) times daily. 06/29/17  Yes [provider]  escitalopram (LEXAPRO) 20 MG tablet TAKE 1 TABLET BY MOUTH EVERY DAY Patient taking differently: Take 20 mg by mouth daily. 07/15/21  Yes [name], MD  fidaxomicin (DIFICID) 200 MG TABS tablet Take 1 tablet (200 mg total) by mouth 2 (two) times daily for 10 days. 07/15/21 07/25/21 Yes Gatha Mayer, MD  fluticasone (FLONASE) 50 MCG/ACT nasal spray Place 2 sprays into both nostrils daily. 07/16/21  Yes [name], MD  hydrochlorothiazide (MICROZIDE) 12.5 MG capsule TAKE 1 CAPSULE BY MOUTH EVERY DAY Patient taking differently: Take 12.5 mg by mouth daily. 06/06/21  Yes Allred, Jeneen Rinks, MD  pantoprazole (PROTONIX) 20 MG tablet Take 1 tablet (20 mg total) by mouth 2 (two) times daily. Patient taking differently: Take 20 mg by mouth daily. 07/16/21  Yes [name], MD  traMADol (ULTRAM) 50 MG tablet Take 0.5-1 tablets (25-50 mg total) by mouth every 8 (eight) hours as needed for up to 5 days (cough. codeine allergy so avoiding codeine coug syrups.). Patient taking differently: Take 25-50 mg by mouth every 8 (eight) hours as needed for moderate pain. 07/16/21 07/21/21 Yes [name], MD   atorvastatin (LIPITOR) 20 MG tablet Take 1 tablet (20 mg total) by mouth 3 (three) times a week. Patient not taking: Reported on 07/20/2021 02/08/21   [name], MD  meclizine (ANTIVERT) 25 MG tablet Take 1 tablet (25 mg total) by mouth 3 (three) times daily as needed for dizziness or nausea (vertigo). Patient not taking: Reported on 07/20/2021 03/14/20   Truddie Hidden, MD  ondansetron (ZOFRAN) 4 MG tablet Take 1 tablet (4 mg total) by mouth every 8 (eight) hours as needed for nausea or vomiting. Patient not  taking: Reported on 07/20/2021 03/09/20   Inda Coke, PA  valsartan (DIOVAN) 320 MG tablet TAKE 1 TABLET BY MOUTH EVERY DAY 04/01/21   [name], MD    Physical Exam: Vitals:   07/20/21 1400 07/20/21 1430 07/20/21 1500 07/20/21 1522  BP: (!) 159/76 (!) 160/66 (!) 161/75   Pulse: (!) 106 (!) 105 (!) 115   Resp: (!) 27 (!) 28 (!) 23   Temp:    100.1 F (37.8 C)  TempSrc:    Oral  SpO2: 94% 90% 95%     General: 80 y.o. female resting in bed in NAD Eyes: PERRL, normal sclera ENMT: Nares patent w/o discharge, orophaynx clear, dentition normal, ears w/o discharge/lesions/ulcers Neck: Supple, trachea midline Cardiovascular: tachy, +S1, S2, no m/g/r, equal pulses throughout Respiratory: decreased at bases, soft rhonchi, somewhat breathless w/ speech GI: BS+, NDNT, no masses noted, no organomegaly noted MSK: No e/c/c Neuro: A&O x 3, no focal deficits Psyc: Appropriate interaction and affect, calm/cooperative  Labs on Admission: I have personally reviewed following labs and imaging studies  CBC: Recent Labs  Lab 07/20/21 1155  WBC 12.0*  HGB 12.3  HCT 37.9  MCV 94.5  PLT 315   Basic Metabolic Panel: Recent Labs  Lab 07/20/21 1155  NA 133*  K 2.4*  CL 91*  CO2 30  GLUCOSE 209*  BUN 9  CREATININE 0.69  CALCIUM 8.6*  MG 1.6*   GFR: Estimated Creatinine Clearance: 40.9 mL/min (by C-G formula based on SCr of 0.69 mg/dL). Liver Function  Tests: Recent Labs  Lab 07/20/21 1155  AST 20  ALT 14  ALKPHOS 54  BILITOT 0.8  PROT 6.9  ALBUMIN 3.4*   No results for input(s): LIPASE, AMYLASE in the last 168 hours. No results for input(s): AMMONIA in the last 168 hours. Coagulation Profile: No results for input(s): INR, PROTIME in the last 168 hours. Cardiac Enzymes: No results for input(s): CKTOTAL, CKMB, CKMBINDEX, TROPONINI in the last 168 hours. BNP (last 3 results) No results for input(s): PROBNP in the last 8760 hours. HbA1C: No results for input(s): HGBA1C in the last 72 hours. CBG: No results for input(s): GLUCAP in the last 168 hours. Lipid Profile: No results for input(s): CHOL, HDL, LDLCALC, TRIG, CHOLHDL, LDLDIRECT in the last 72 hours. Thyroid Function Tests: No results for input(s): TSH, T4TOTAL, FREET4, T3FREE, THYROIDAB in the last 72 hours. Anemia Panel: No results for input(s): VITAMINB12, FOLATE, FERRITIN, TIBC, IRON, RETICCTPCT in the last 72 hours. Urine analysis:    Component Value Date/Time   COLORURINE DARK YELLOW 03/13/2016 1213   APPEARANCEUR CLEAR 03/13/2016 1213   LABSPEC 1.022 03/13/2016 1213   PHURINE 6.0 03/13/2016 1213   GLUCOSEU NEGATIVE 03/13/2016 1213   HGBUR NEGATIVE 03/13/2016 1213   BILIRUBINUR neg 04/18/2021 1538   KETONESUR NEGATIVE 03/13/2016 1213   PROTEINUR Positive (A) 04/18/2021 1538   PROTEINUR NEGATIVE 03/13/2016 1213   UROBILINOGEN 0.2 04/18/2021 1538   UROBILINOGEN 0.2 01/22/2015 1434   NITRITE neg 04/18/2021 1538   NITRITE NEGATIVE 03/13/2016 1213   LEUKOCYTESUR Negative 04/18/2021 1538    Radiological Exams on Admission: DG Chest 2 View  Result Date: 07/20/2021 CLINICAL DATA:  Shortness of breath EXAM: CHEST - 2 VIEW COMPARISON:  Chest x-ray dated July 16, 2021 FINDINGS: Cardiac and mediastinal contours are unchanged within normal limits. Mild bibasilar opacities. No evidence of large pleural effusion or pneumothorax. IMPRESSION: Mild bibasilar opacities,  likely due to atelectasis. Electronically Signed   By: Hosie Poisson.D.  On: 07/20/2021 11:50    EKG: Independently reviewed. Sinus tach  Assessment/Plan Sepsis secondary to C diff     - admit to inpt, progressive     - continue dificid (prescribed by GI, but hadn't started yet)     - fluids  ?BLL PNA     - c/o congestion, cough     - CXR w/ BLL opacities; difficult to start additional abx w/ C diff treatment, but she would likely benefit from a respiratory standpoint     - will start CAP coverage w/ doxy     - guaifenesin and PRN nebs  Hypokalemia Hypomagnesemia Hyponatremia     - fluids, replace K+ and Mg2+; follow up renal function panel tonight and in AM  DM2      - diet controlled      - place on carb mod diet  HTN     - continue home regimen  DVT prophylaxis: lovenox  Code Status: FULL  Family Communication: spoke w/ dtr at bedside  Consults called: None   Status is: Inpatient  Remains inpatient appropriate because: severity of illness  Jonnie Finner DO [facility]  If 7PM-7AM, please contact night-coverage [email or web address]  07/20/2021, 3:24 PM

## 2021-07-20 NOTE — ED Notes (Signed)
Pt NAD in bed, a/ox4. Pt states she has been feeling ill x 1 month with diarrhea, weakness, dizziness. Pt sattes she had ABD pain but does not now. +subjective fever. Denies n/v.

## 2021-07-20 NOTE — ED Provider Notes (Signed)
[facility] DEPT Provider Note   CSN: [id number] Arrival date & time: 07/20/21  1057     History No chief complaint on file.   [name] is a 80 y.o. female presented emerged department generalized fatigue and weakness and cough for 4 weeks.  The patient lives by herself.  She states that she has been feeling "terrible" for about 4 weeks.  She says she has no energy, she has had a dry cough.  She went to UC on 11/12 with negative flu and covid tests, started on amoxicillin for cough.  She developed diarrhea, reported tested positive for C Diff.  She saw her PCP and had an x-ray performed about a week ago which did not show any focal infiltrates, but did note low lung volumes with mild bibasilar atelectasis.  She unfortunately did test positive for C. difficile, was started on Dificid 200 mg bid x 10 days.  HPI     Past Medical History:  Diagnosis Date   Adenomatous colon polyp 2006   Anxiety    Arthritis    Benign esophageal stricture 10/14/2012   CERUMEN IMPACTION, BILATERAL 12/13/2007   CERVICAL RADICULOPATHY 11/05/2009   Chronic kidney disease    kidney stones   DEPRESSION 03/11/2007   DIVERTICULOSIS, COLON 03/11/2007   Esophagitis, reflux 10/14/2012   GERD (gastroesophageal reflux disease) 10/14/2012   Glaucoma    GLAUCOMA ASSOCIATED W/UNSPEC OCULAR DISORDER 11/05/2009   Hearing loss    HEMORRHOIDS, EXTERNAL W/O COMPLICATION 6/43/3295   Qualifier: Diagnosis of  By: Jimmye Norman, LPN, Bonnye M    HYPERLIPIDEMIA 09/22/2007   HYPERTENSION 03/11/2007   Kidney stone    Osteopenia 02/2015   T score -1.4 FRAX 7.3%/0.6%   Rectocele    Rosacea 03/11/2007   Tachycardia     Patient Active Problem List   Diagnosis Date Noted   Vertigo 04/06/2020   Sensorineural hearing loss (SNHL), bilateral 04/06/2020   Aortic atherosclerosis ([place]) 07/21/2019   Diabetes mellitus without complication ([place]) 18/84/1660   GERD (gastroesophageal reflux disease) 07/14/2014    Primary open angle glaucoma of both eyes, mild stage 05/29/2014   Knee pain, left 11/02/2013   IBS (irritable bowel syndrome)- diarrhea predominant 01/26/2013   History of colonic polyps 11/28/2011   History of supraventricular tachycardia 08/14/2011   Senile nuclear sclerosis 06/29/2011   Glaucoma associated with ocular disorder 11/05/2009   Cervical disc disorder with radiculopathy of cervical region 11/05/2009   Hyperlipidemia associated with type 2 diabetes mellitus ([place]) 09/22/2007   Major depression in full remission ([place])- Lexapro 20mg . Xanax once a week or less. 03/11/2007   Hypertension associated with diabetes ([place]) 03/11/2007   Rosacea 03/11/2007    Past Surgical History:  Procedure Laterality Date   ANTERIOR CERVICAL DECOMP/DISCECTOMY FUSION  08/14/2011   Procedure: ANTERIOR CERVICAL DECOMPRESSION/DISCECTOMY FUSION 1 LEVEL/HARDWARE REMOVAL;  Surgeon: [name], MD;  Location: [facility];  Service: Neurosurgery;  Laterality: N/A;  Cervical Six-Seven anterior cervical decompression with fusion interbody prothesis plating and bonegraft with removal of hardware of Cervical Four to Cervical Six    CERVICAL FUSION     C4-6   COLONOSCOPY     multiple   ESOPHAGOGASTRODUODENOSCOPY  10/14/2012   Dr. [name]   KIDNEY SURGERY     age 58   TONSILLECTOMY       OB History     Gravida  5   Para  5   Term  5   Preterm  AB      Living  5      SAB      IAB      Ectopic      Multiple      Live Births              Family History  Problem Relation Age of Onset   Mental illness Mother        comitted suicide   Glaucoma Mother    Lung cancer Father        smoker   Hypertension Father    Heart disease Father        age 70, smoker   Cerebral palsy Sister    Stroke Sister        multiple   Diabetes Maternal Grandmother    Stroke Paternal Aunt    Stroke Paternal Uncle    Stroke Paternal Uncle    Stroke Paternal Uncle    Stroke Paternal Aunt     Pancreatic cancer Daughter    Heart attack Son        75   Heart attack Son        19   Colon cancer Neg Hx    Stomach cancer Neg Hx    Esophageal cancer Neg Hx    Rectal cancer Neg Hx     Social History   Tobacco Use   Smoking status: Never   Smokeless tobacco: Never  Vaping Use   Vaping Use: Never used  Substance Use Topics   Alcohol use: No    Alcohol/week: 0.0 standard drinks   Drug use: No    Home Medications Prior to Admission medications   Medication Sig Start Date End Date Taking? Authorizing Provider  acetaminophen (TYLENOL) 325 MG tablet Take 650 mg by mouth every 6 (six) hours as needed for moderate pain.    [provider]  albuterol (VENTOLIN HFA) 108 (90 Base) MCG/ACT inhaler Inhale 2 puffs into the lungs every 6 (six) hours as needed for wheezing or shortness of breath (or cough). 07/16/21   [name], MD  ALPRAZolam Duanne Moron) 0.25 MG tablet TAKE 1 TABLET (0.25 MG TOTAL) BY MOUTH DAILY AS NEEDED. FOR ANXIETY 11/22/20   [name], MD  atorvastatin (LIPITOR) 20 MG tablet Take 1 tablet (20 mg total) by mouth 3 (three) times a week. 02/08/21   [name], MD  Calcium Carbonate-Vitamin D (CALCIUM + D PO) Take 1 tablet by mouth daily.    [provider]  diltiazem (CARDIZEM CD) 240 MG 24 hr capsule TAKE 1 CAPSULE BY MOUTH EVERY DAY 06/07/21   [name], MD  dorzolamide-timolol (COSOPT) 22.3-6.8 MG/ML ophthalmic solution Place 1 drop into both eyes 2 (two) times daily. 06/29/17   [provider]  escitalopram (LEXAPRO) 20 MG tablet TAKE 1 TABLET BY MOUTH EVERY DAY 07/15/21   [name], MD  fidaxomicin (DIFICID) 200 MG TABS tablet Take 1 tablet (200 mg total) by mouth 2 (two) times daily for 10 days. 07/15/21 07/25/21  Gatha Mayer, MD  fluticasone (FLONASE) 50 MCG/ACT nasal spray Place 2 sprays into both nostrils daily. 07/16/21   [name], MD  hydrochlorothiazide (MICROZIDE) 12.5 MG capsule TAKE 1  CAPSULE BY MOUTH EVERY DAY 06/06/21   Allred, Jeneen Rinks, MD  meclizine (ANTIVERT) 25 MG tablet Take 1 tablet (25 mg total) by mouth 3 (three) times daily as needed for dizziness or nausea (vertigo). 03/14/20   Truddie Hidden, MD  ondansetron (ZOFRAN) 4 MG tablet Take 1 tablet (4 mg total) by mouth every 8 (eight) hours as needed for nausea or vomiting. 03/09/20   Inda Coke, PA  pantoprazole (PROTONIX) 20 MG tablet Take 1 tablet (20 mg total) by mouth 2 (two) times daily. 07/16/21   [name], MD  traMADol (ULTRAM) 50 MG tablet Take 0.5-1 tablets (25-50 mg total) by mouth every 8 (eight) hours as needed for up to 5 days (cough. codeine allergy so avoiding codeine coug syrups.). 07/16/21 07/21/21  [name], MD  valsartan (DIOVAN) 320 MG tablet TAKE 1 TABLET BY MOUTH EVERY DAY 04/01/21   [name], MD    Allergies    Codeine  Review of Systems   Review of Systems  Constitutional:  Positive for appetite change and fatigue. Negative for chills and fever.  Eyes:  Negative for pain and visual disturbance.  Respiratory:  Positive for cough and shortness of breath.   Cardiovascular:  Negative for chest pain and palpitations.  Gastrointestinal:  Positive for diarrhea and nausea. Negative for abdominal pain.  Genitourinary:  Negative for dysuria and hematuria.  Musculoskeletal:  Positive for myalgias. Negative for arthralgias.  Skin:  Negative for color change and rash.  Neurological:  Negative for syncope and headaches.  All other systems reviewed and are negative.  Physical Exam Updated Vital Signs BP (!) 142/69 (BP Location: Left Arm)   Pulse 98   Temp 98.1 F (36.7 C) (Oral)   Resp 16   LMP 08/12/1991   SpO2 94%   Physical Exam Constitutional:      General: She is not in acute distress.    Comments: Appears tired  HENT:     Head: Normocephalic and atraumatic.  Eyes:     Conjunctiva/sclera: Conjunctivae normal.     Pupils: Pupils are equal, round, and  reactive to light.  Cardiovascular:     Rate and Rhythm: Normal rate and regular rhythm.  Pulmonary:     Effort: Pulmonary effort is normal. No respiratory distress.  Abdominal:     General: There is no distension.     Tenderness: There is no abdominal tenderness.  Skin:    General: Skin is warm and dry.  Neurological:     General: No focal deficit present.     Mental Status: She is alert. Mental status is at baseline.  Psychiatric:        Mood and Affect: Mood normal.        Behavior: Behavior normal.    ED Results / Procedures / Treatments   Labs (all labs ordered are listed, but only abnormal results are displayed) Labs Reviewed  CBC - Abnormal; Notable for the following components:      Result Value   WBC 12.0 (*)    All other components within normal limits  RESP PANEL BY RT-PCR (FLU A&B, COVID) ARPGX2  COMPREHENSIVE METABOLIC PANEL  MAGNESIUM  TROPONIN I (HIGH SENSITIVITY)    EKG None  Radiology DG Chest 2 View  Result Date: 07/20/2021 CLINICAL DATA:  Shortness of breath EXAM: CHEST - 2 VIEW COMPARISON:  Chest x-ray dated July 16, 2021 FINDINGS: Cardiac and mediastinal contours are unchanged within normal limits. Mild bibasilar opacities. No evidence of large pleural effusion or pneumothorax. IMPRESSION: Mild bibasilar opacities, likely due to atelectasis. Electronically Signed   By: Yetta Glassman M.D.   On: 07/20/2021 11:50    Procedures Procedures   Medications Ordered in ED Medications  sodium chloride 0.9 % bolus  1,000 mL (has no administration in time range)    ED Course  I have reviewed the triage vital signs and the nursing notes.  Pertinent labs & imaging results that were available during my care of the patient were reviewed by me and considered in my medical decision making (see chart for details).  Differential diagnosis for the patient's fatigue, cough, exhaustion for the past 4 weeks would include viral illness including influenza versus  anemia versus dehydration versus other infection versus pneumonia versus atypical ACS.  She has no active chest pain.  I personally reviewed her prior medical records including her outpatient imaging.  Today she had a repeat x-ray of the chest which per my interpretation shows continued at low bibasilar atelectasis, question is whether this may be a pneumonia.  She was briefly on amoxicillin but this was discontinued as she tested positive for C. Difficile toxin on GI panel on 06/28/21.  Clinical Course as of 07/20/21 1614  Sat Jul 20, 2021  1240 K and Mag ordered.  Will admit for hypokalemia likely 2/2 diarrhea.   [MT]  1242 Potassium(!!): 2.4 [MT]  1302 Patient and her daughter at bedside updated regarding plans for admission and in agreement. [MT]  105 Page for admission [MT]  1447 Admitted to hospitalist [MT]    Clinical Course User Index [MT] Yandiel Bergum, Carola Rhine, MD    Final Clinical Impression(s) / ED Diagnoses Final diagnoses:  None    Rx / DC Orders ED Discharge Orders     None        Wyvonnia Dusky, MD 07/20/21 [phone]

## 2021-07-20 NOTE — ED Notes (Signed)
Blount, Lolita Cram, NP made aware pt HR 120-140's, RR 25-39, Pt Oxygen 92%, and new EKG completed.

## 2021-07-21 ENCOUNTER — Encounter (HOSPITAL_COMMUNITY): Payer: Self-pay | Admitting: Internal Medicine

## 2021-07-21 ENCOUNTER — Inpatient Hospital Stay (HOSPITAL_COMMUNITY): Payer: Medicare Other

## 2021-07-21 DIAGNOSIS — I152 Hypertension secondary to endocrine disorders: Secondary | ICD-10-CM

## 2021-07-21 DIAGNOSIS — N182 Chronic kidney disease, stage 2 (mild): Secondary | ICD-10-CM

## 2021-07-21 DIAGNOSIS — E876 Hypokalemia: Secondary | ICD-10-CM

## 2021-07-21 DIAGNOSIS — E1159 Type 2 diabetes mellitus with other circulatory complications: Secondary | ICD-10-CM

## 2021-07-21 LAB — COMPREHENSIVE METABOLIC PANEL
ALT: 12 U/L (ref 0–44)
AST: 17 U/L (ref 15–41)
Albumin: 3 g/dL — ABNORMAL LOW (ref 3.5–5.0)
Alkaline Phosphatase: 53 U/L (ref 38–126)
Anion gap: 10 (ref 5–15)
BUN: 7 mg/dL — ABNORMAL LOW (ref 8–23)
CO2: 28 mmol/L (ref 22–32)
Calcium: 7.7 mg/dL — ABNORMAL LOW (ref 8.9–10.3)
Chloride: 99 mmol/L (ref 98–111)
Creatinine, Ser: 0.52 mg/dL (ref 0.44–1.00)
GFR, Estimated: >60 mL/min (ref >60)
Glucose, Bld: 133 mg/dL — ABNORMAL HIGH (ref 70–99)
Potassium: 3.1 mmol/L — ABNORMAL LOW (ref 3.5–5.1)
Sodium: 137 mmol/L (ref 135–145)
Total Bilirubin: 1.4 mg/dL — ABNORMAL HIGH (ref 0.3–1.2)
Total Protein: 6.5 g/dL (ref 6.5–8.1)

## 2021-07-21 LAB — CBC
HCT: 35.2 % — ABNORMAL LOW (ref 36.0–46.0)
Hemoglobin: 11.6 g/dL — ABNORMAL LOW (ref 12.0–15.0)
MCH: 30.5 pg (ref 26.0–34.0)
MCHC: 33 g/dL (ref 30.0–36.0)
MCV: 92.6 fL (ref 80.0–100.0)
Platelets: 265 10*3/uL (ref 150–400)
RBC: 3.8 MIL/uL — ABNORMAL LOW (ref 3.87–5.11)
RDW: 12.1 % (ref 11.5–15.5)
WBC: 14.1 10*3/uL — ABNORMAL HIGH (ref 4.0–10.5)
nRBC: 0 % (ref 0.0–0.2)

## 2021-07-21 LAB — C DIFFICILE QUICK SCREEN W PCR REFLEX
C Diff antigen: NEGATIVE
C Diff interpretation: NOT DETECTED
C Diff toxin: NEGATIVE

## 2021-07-21 LAB — TROPONIN I (HIGH SENSITIVITY): Troponin I (High Sensitivity): 86 ng/L — ABNORMAL HIGH (ref <18)

## 2021-07-21 LAB — PROTIME-INR
INR: 1.2 (ref 0.8–1.2)
Prothrombin Time: 15 seconds (ref 11.4–15.2)

## 2021-07-21 LAB — RENAL FUNCTION PANEL
Albumin: 2.8 g/dL — ABNORMAL LOW (ref 3.5–5.0)
Anion gap: 9 (ref 5–15)
BUN: 6 mg/dL — ABNORMAL LOW (ref 8–23)
CO2: 29 mmol/L (ref 22–32)
Calcium: 7.2 mg/dL — ABNORMAL LOW (ref 8.9–10.3)
Chloride: 99 mmol/L (ref 98–111)
Creatinine, Ser: 0.53 mg/dL (ref 0.44–1.00)
GFR, Estimated: >60 mL/min (ref >60)
Glucose, Bld: 132 mg/dL — ABNORMAL HIGH (ref 70–99)
Phosphorus: 1.6 mg/dL — ABNORMAL LOW (ref 2.5–4.6)
Potassium: 2.7 mmol/L — CL (ref 3.5–5.1)
Sodium: 137 mmol/L (ref 135–145)

## 2021-07-21 LAB — HEMOGLOBIN A1C
Hgb A1c MFr Bld: 6.1 % — ABNORMAL HIGH (ref 4.8–5.6)
Mean Plasma Glucose: 128.37 mg/dL

## 2021-07-21 LAB — LACTIC ACID, PLASMA
Lactic Acid, Venous: 1.5 mmol/L (ref 0.5–1.9)
Lactic Acid, Venous: 1.2 mmol/L (ref 0.5–1.9)

## 2021-07-21 LAB — CORTISOL-AM, BLOOD: Cortisol - AM: 28.1 ug/dL — ABNORMAL HIGH (ref 6.7–22.6)

## 2021-07-21 LAB — PROCALCITONIN: Procalcitonin: 0.18 ng/mL

## 2021-07-21 MED ORDER — METOPROLOL TARTRATE 5 MG/5ML IV SOLN: 5.0000 mg | Freq: Four times a day (QID) | INTRAVENOUS | Status: DC | PRN

## 2021-07-21 MED ORDER — GUAIFENESIN ER 600 MG PO TB12
600.0000 mg | ORAL_TABLET | Freq: Two times a day (BID) | ORAL | Status: DC
  Administered 2021-07-21 – 2021-07-31 (×21): 600 mg via ORAL
  Filled 2021-07-21 (×21): qty 1

## 2021-07-21 MED ORDER — SODIUM CHLORIDE 0.9 % IV SOLN: INTRAVENOUS | Status: DC

## 2021-07-21 MED ORDER — ONDANSETRON HCL 4 MG PO TABS
4.0000 mg | ORAL_TABLET | Freq: Four times a day (QID) | ORAL | Status: DC | PRN
  Administered 2021-07-25: 4 mg via ORAL
  Filled 2021-07-21: qty 1

## 2021-07-21 MED ORDER — ONDANSETRON HCL 4 MG/2ML IJ SOLN: 4.0000 mg | Freq: Four times a day (QID) | INTRAMUSCULAR | Status: DC | PRN

## 2021-07-21 MED ORDER — FLUTICASONE PROPIONATE 50 MCG/ACT NA SUSP
2.0000 | Freq: Every day | NASAL | Status: DC
  Administered 2021-07-22 – 2021-07-31 (×10): 2 via NASAL
  Filled 2021-07-21: qty 16

## 2021-07-21 MED ORDER — ENOXAPARIN SODIUM 40 MG/0.4ML IJ SOSY
40.0000 mg | PREFILLED_SYRINGE | INTRAMUSCULAR | Status: DC
  Administered 2021-07-21 – 2021-07-31 (×11): 40 mg via SUBCUTANEOUS
  Filled 2021-07-21 (×11): qty 0.4

## 2021-07-21 MED ORDER — ESCITALOPRAM OXALATE 20 MG PO TABS
20.0000 mg | ORAL_TABLET | Freq: Every day | ORAL | Status: DC
  Administered 2021-07-21 – 2021-07-31 (×11): 20 mg via ORAL
  Filled 2021-07-21 (×11): qty 1

## 2021-07-21 MED ORDER — ALBUTEROL SULFATE HFA 108 (90 BASE) MCG/ACT IN AERS: 2.0000 | INHALATION_SPRAY | Freq: Four times a day (QID) | RESPIRATORY_TRACT | Status: DC | PRN

## 2021-07-21 MED ORDER — DORZOLAMIDE HCL-TIMOLOL MAL 2-0.5 % OP SOLN
1.0000 [drp] | Freq: Two times a day (BID) | OPHTHALMIC | Status: DC
  Administered 2021-07-21 – 2021-07-31 (×21): 1 [drp] via OPHTHALMIC
  Filled 2021-07-21: qty 10

## 2021-07-21 MED ORDER — TRAMADOL HCL 50 MG PO TABS
50.0000 mg | ORAL_TABLET | Freq: Four times a day (QID) | ORAL | Status: DC | PRN
  Administered 2021-07-21 – 2021-07-30 (×17): 50 mg via ORAL
  Filled 2021-07-21 (×17): qty 1

## 2021-07-21 MED ORDER — DOXYCYCLINE HYCLATE 100 MG PO TABS
100.0000 mg | ORAL_TABLET | Freq: Two times a day (BID) | ORAL | Status: DC
  Administered 2021-07-21 – 2021-07-22 (×4): 100 mg via ORAL
  Filled 2021-07-21 (×4): qty 1

## 2021-07-21 MED ORDER — ACETAMINOPHEN 650 MG RE SUPP: 650.0000 mg | Freq: Four times a day (QID) | RECTAL | Status: DC | PRN

## 2021-07-21 MED ORDER — ALPRAZOLAM 0.25 MG PO TABS
0.2500 mg | ORAL_TABLET | Freq: Every day | ORAL | Status: DC | PRN
  Administered 2021-07-22 – 2021-07-31 (×6): 0.25 mg via ORAL
  Filled 2021-07-21 (×7): qty 1

## 2021-07-21 MED ORDER — ALBUTEROL SULFATE (2.5 MG/3ML) 0.083% IN NEBU: 2.5000 mg | INHALATION_SOLUTION | Freq: Four times a day (QID) | RESPIRATORY_TRACT | Status: DC | PRN

## 2021-07-21 MED ORDER — ACETAMINOPHEN 325 MG PO TABS
650.0000 mg | ORAL_TABLET | Freq: Four times a day (QID) | ORAL | Status: DC | PRN
  Administered 2021-07-25 – 2021-07-31 (×4): 650 mg via ORAL
  Filled 2021-07-21 (×4): qty 2

## 2021-07-21 MED ORDER — DILTIAZEM HCL ER COATED BEADS 240 MG PO CP24
240.0000 mg | ORAL_CAPSULE | Freq: Every day | ORAL | Status: DC
  Administered 2021-07-21 – 2021-07-31 (×11): 240 mg via ORAL
  Filled 2021-07-21 (×11): qty 1

## 2021-07-21 NOTE — ED Notes (Signed)
Pt instructed to keep arm straight so IV pump does not alarm

## 2021-07-21 NOTE — Progress Notes (Signed)
  Progress Note    [name]   [id number]  DOB: [birth year]  DOA: 07/20/2021     1 PCP: [name], MD  Initial CC: diarrhea  Hospital Course: [name] is an 80 year old female with PMH depression/anxiety, CKD 2, GERD, HLD, HTN, osteopenia who presented with cough and ongoing diarrhea. She recently completed a course of amoxicillin for a respiratory infection and states that she has been started developing diarrhea afterwards.  She underwent testing and was found to be positive for C. difficile and was started on oral vancomycin.  After completing treatment, she still had ongoing diarrhea and was prescribed fidaxomicin however due to still not feeling better, she presented to the hospital and had not yet started the fidaxomicin.  Interval History:  Resting in bed this morning.  Dry mouth appreciated and difficulty answering questions due to this along with some nasal congestion noted.  Still has ongoing diarrhea.  Assessment & Plan: * C. difficile diarrhea - see sepsis also - Continue fidaxomicin - Follow-up cultures  Hypokalemia - Replete as needed  Sepsis ([place]) - Tachycardia, tachypnea, leukocytosis, GI source due to C. Difficile - follow up cultures - continue fidaxomicin   CKD (chronic kidney disease) stage 2, GFR 60-89 ml/min - patient has history of CKD2. Baseline creat ~ 0.7 - 0.8, eGFR 79   Diabetes mellitus without complication (HCC) - N0N 6.1% on admission - Continue diet control  GERD (gastroesophageal reflux disease) - Hold PPI for now  Hypertension associated with diabetes ([place]) - Continue Cardizem  Hyperlipidemia associated with type 2 diabetes mellitus ([place]) - Hold Lipitor for now    Old records reviewed in assessment of this patient  Antimicrobials: FIdaxomicin 07/20/2021 >> current  DVT prophylaxis: Lovenox  Code Status:   Code Status: Full Code  Disposition Plan:   Status is: Inpt  Objective: Blood pressure (!) 167/88,  pulse (!) 110, temperature 99.2 F (37.3 C), temperature source Oral, resp. rate 20, height $RemoveBe'5\' 1"'hEITedpis$  (1.549 m), weight 53.1 kg, last menstrual period 08/12/1991, SpO2 97 %.  Examination:  Physical Exam Constitutional:      Comments: Pleasant elderly woman laying in bed appearing fatigued/lethargic with slightly nasally speech  HENT:     Head: Normocephalic and atraumatic.     Nose: Congestion present.     Mouth/Throat:     Mouth: Mucous membranes are dry.  Eyes:     Extraocular Movements: Extraocular movements intact.  Cardiovascular:     Rate and Rhythm: Tachycardia present. Rhythm irregular.  Pulmonary:     Effort: Pulmonary effort is normal.     Breath sounds: Normal breath sounds.  Abdominal:     General: Bowel sounds are normal. There is distension.     Palpations: Abdomen is soft.     Tenderness: There is no abdominal tenderness.  Musculoskeletal:        General: Normal range of motion.     Cervical back: Normal range of motion and neck supple.  Skin:    General: Skin is warm and dry.  Neurological:     General: No focal deficit present.     Consultants:    Procedures:    Data Reviewed: I have personally reviewed labs and imaging studies    LOS: 1 day  Time spent: Greater than 50% of the 35 minute visit was spent in counseling/coordination of care for the patient as laid out in the A&P.   Dwyane Dee, MD [facility] 07/21/2021, 1:37 PM

## 2021-07-21 NOTE — Assessment & Plan Note (Signed)
-   A1c 6.1% on admission - Continue diet control

## 2021-07-21 NOTE — Assessment & Plan Note (Signed)
-   Hold PPI for now

## 2021-07-21 NOTE — Progress Notes (Signed)
Patient refuses to have [place] - Pt. Educated on using the call bell for assistance when needing to get out of bed to bedside commode.  Pt. Verbalized understanding.  Bed alarm on.

## 2021-07-21 NOTE — Assessment & Plan Note (Signed)
-   Hold Lipitor for now

## 2021-07-21 NOTE — Assessment & Plan Note (Signed)
-  patient has history of CKD2. Baseline creat ~ 0.7 - 0.8, eGFR 79

## 2021-07-21 NOTE — ED Notes (Signed)
Pt IV pump continues to alarm even after educating pt several times to keep left arm straight

## 2021-07-21 NOTE — ED Notes (Signed)
Pt instructed to keep arm straight so IV pump will not go off, again.

## 2021-07-21 NOTE — Assessment & Plan Note (Signed)
Continue Cardizem. 

## 2021-07-21 NOTE — Assessment & Plan Note (Addendum)
-   Tachycardia, tachypnea, leukocytosis, GI source due to C. Difficile - continue fidaxomicin

## 2021-07-21 NOTE — Hospital Course (Signed)
[name] is an 80 year old female with PMH depression/anxiety, CKD 2, GERD, HLD, HTN, osteopenia who presented with cough and ongoing diarrhea. She recently completed a course of amoxicillin for a respiratory infection and states that she has been started developing diarrhea afterwards.  She underwent testing and was found to be positive for C. difficile and was started on oral vancomycin.  After completing treatment, she still had ongoing diarrhea and was prescribed fidaxomicin however due to still not feeling better, she presented to the hospital and had not yet started the fidaxomicin.

## 2021-07-21 NOTE — Assessment & Plan Note (Signed)
-   Replete as needed 

## 2021-07-21 NOTE — Assessment & Plan Note (Addendum)
-   see sepsis also - Continue fidaxomicin -Still having some diarrhea and now having some lower abdominal pain - Check CT abdomen/pelvis to further evaluate -if imaging is reassuring, we would be awaiting further improvement of diarrhea and/or stabilization of electrolytes prior to being considered stable for discharge

## 2021-07-22 ENCOUNTER — Other Ambulatory Visit (HOSPITAL_COMMUNITY): Payer: Self-pay

## 2021-07-22 ENCOUNTER — Encounter (HOSPITAL_COMMUNITY): Payer: Self-pay | Admitting: Internal Medicine

## 2021-07-22 ENCOUNTER — Inpatient Hospital Stay (HOSPITAL_COMMUNITY): Payer: Medicare Other

## 2021-07-22 DIAGNOSIS — A419 Sepsis, unspecified organism: Secondary | ICD-10-CM

## 2021-07-22 LAB — CBC WITH DIFFERENTIAL/PLATELET
Abs Immature Granulocytes: 0.15 10*3/uL — ABNORMAL HIGH (ref 0.00–0.07)
Basophils Absolute: 0 10*3/uL (ref 0.0–0.1)
Basophils Relative: 0 %
Eosinophils Absolute: 0 10*3/uL (ref 0.0–0.5)
Eosinophils Relative: 0 %
HCT: 32.2 % — ABNORMAL LOW (ref 36.0–46.0)
Hemoglobin: 10.6 g/dL — ABNORMAL LOW (ref 12.0–15.0)
Immature Granulocytes: 1 %
Lymphocytes Relative: 9 %
Lymphs Abs: 1.6 10*3/uL (ref 0.7–4.0)
MCH: 30.5 pg (ref 26.0–34.0)
MCHC: 32.9 g/dL (ref 30.0–36.0)
MCV: 92.8 fL (ref 80.0–100.0)
Monocytes Absolute: 1.2 10*3/uL — ABNORMAL HIGH (ref 0.1–1.0)
Monocytes Relative: 7 %
Neutro Abs: 14.6 10*3/uL — ABNORMAL HIGH (ref 1.7–7.7)
Neutrophils Relative %: 83 %
Platelets: 295 10*3/uL (ref 150–400)
RBC: 3.47 MIL/uL — ABNORMAL LOW (ref 3.87–5.11)
RDW: 12.3 % (ref 11.5–15.5)
WBC: 17.6 10*3/uL — ABNORMAL HIGH (ref 4.0–10.5)
nRBC: 0 % (ref 0.0–0.2)

## 2021-07-22 LAB — BASIC METABOLIC PANEL
Anion gap: 10 (ref 5–15)
BUN: 13 mg/dL (ref 8–23)
CO2: 25 mmol/L (ref 22–32)
Calcium: 7.5 mg/dL — ABNORMAL LOW (ref 8.9–10.3)
Chloride: 101 mmol/L (ref 98–111)
Creatinine, Ser: 0.42 mg/dL — ABNORMAL LOW (ref 0.44–1.00)
GFR, Estimated: >60 mL/min (ref >60)
Glucose, Bld: 143 mg/dL — ABNORMAL HIGH (ref 70–99)
Potassium: 3 mmol/L — ABNORMAL LOW (ref 3.5–5.1)
Sodium: 136 mmol/L (ref 135–145)

## 2021-07-22 LAB — MAGNESIUM: Magnesium: 1.9 mg/dL (ref 1.7–2.4)

## 2021-07-22 MED ORDER — IOHEXOL 350 MG/ML SOLN
75.0000 mL | Freq: Once | INTRAVENOUS | Status: AC | PRN
  Administered 2021-07-22: 75 mL via INTRAVENOUS

## 2021-07-22 MED ORDER — IOHEXOL 9 MG/ML PO SOLN
500.0000 mL | ORAL | Status: AC
  Administered 2021-07-22 (×2): 500 mL via ORAL

## 2021-07-22 MED ORDER — IOHEXOL 9 MG/ML PO SOLN
ORAL | Status: AC
  Filled 2021-07-22: qty 1000

## 2021-07-22 MED ORDER — SODIUM CHLORIDE (PF) 0.9 % IJ SOLN
INTRAMUSCULAR | Status: AC
  Filled 2021-07-22: qty 50

## 2021-07-22 NOTE — TOC Progression Note (Signed)
Transition of Care [facility]) - Progression Note    Patient Details  Name: [name] MRN: [id number] Date of Birth: [birth year]  [facility] of [facility]) CM/SW Contact  Purcell Mouton, RN Phone Number: 07/22/2021, 4:16 PM  Clinical Narrative:    TOC will continue to follow for discharge if pt don't go to CIR will workup for SNF.    Expected Discharge Plan: [place] Barriers to Discharge: No Barriers Identified  Expected Discharge Plan and Services Expected Discharge Plan: [place] arrangements for the past 2 months: [place]                                       Social Determinants of Health (SDOH) Interventions    Readmission Risk Interventions No flowsheet data found.

## 2021-07-22 NOTE — Progress Notes (Signed)
This RN assisted pt back to bed from the recliner chair with 1 person assist and a rolling walker. Pt sat up for 2.5 hrs and tolerated well.

## 2021-07-22 NOTE — Progress Notes (Signed)
Progress Note    [name]   [id number]  DOB: October 20, 1940  DOA: 07/20/2021     2 PCP: Courtney Olp, MD  Initial CC: diarrhea  Hospital Course: [name] is an 80 year old female with PMH depression/anxiety, CKD 2, GERD, HLD, HTN, osteopenia who presented with cough and ongoing diarrhea. She recently completed a course of amoxicillin for a respiratory infection and states that she has been started developing diarrhea afterwards.  She underwent testing and was found to be positive for C. difficile and was started on oral vancomycin.  After completing treatment, she still had ongoing diarrhea and was prescribed fidaxomicin however due to still not feeling better, she presented to the hospital and had not yet started the fidaxomicin.  Interval History:  No events overnight.  Daughter present bedside this morning.  Reviewed images and labs thus far.  Tentative plan is for awaiting further improvement of her diarrhea and/for stabilization of her electrolytes before discharging home. Given some increased lower abdominal pain today, she will undergo CT abdomen/pelvis to better evaluate.  Assessment & Plan: * C. difficile diarrhea - see sepsis also - Continue fidaxomicin -Still having some diarrhea and now having some lower abdominal pain - Check CT abdomen/pelvis to further evaluate -if imaging is reassuring, we would be awaiting further improvement of diarrhea and/or stabilization of electrolytes prior to being considered stable for discharge  Sepsis ([place]) - Tachycardia, tachypnea, leukocytosis, GI source due to C. Difficile - continue fidaxomicin   Hypokalemia - Replete as needed  CKD (chronic kidney disease) stage 2, GFR 60-89 ml/min - patient has history of CKD2. Baseline creat ~ 0.7 - 0.8, eGFR 79   Diabetes mellitus without complication (HCC) - O1B 6.1% on admission - Continue diet control  GERD (gastroesophageal reflux disease) - Hold PPI for  now  Hypertension associated with diabetes ([place]) - Continue Cardizem  Hyperlipidemia associated with type 2 diabetes mellitus ([place]) - Hold Lipitor for now    Old records reviewed in assessment of this patient  Antimicrobials: FIdaxomicin 07/20/2021 >> current  DVT prophylaxis: Lovenox  Code Status:   Code Status: Full Code  Disposition Plan:   Status is: Inpt  Objective: Blood pressure 140/85, pulse 95, temperature 99.2 F (37.3 C), temperature source Oral, resp. rate 20, height '5\' 1"'  (1.549 m), weight 53.1 kg, last menstrual period 08/12/1991, SpO2 95 %.  Examination:  Physical Exam Constitutional:      General: She is not in acute distress.    Appearance: Normal appearance.  HENT:     Head: Normocephalic and atraumatic.     Mouth/Throat:     Mouth: Mucous membranes are moist.  Eyes:     Extraocular Movements: Extraocular movements intact.  Cardiovascular:     Rate and Rhythm: Normal rate. Rhythm irregular.  Pulmonary:     Effort: Pulmonary effort is normal.     Breath sounds: Normal breath sounds.  Abdominal:     General: Bowel sounds are normal. There is no distension.     Palpations: Abdomen is soft.     Comments: Mild mid lower abdominal TTP, no R/G  Musculoskeletal:        General: Normal range of motion.     Cervical back: Normal range of motion and neck supple.  Skin:    General: Skin is warm and dry.  Neurological:     General: No focal deficit present.     Mental Status: She is alert.  Psychiatric:  Mood and Affect: Mood normal.        Behavior: Behavior normal.     Consultants:    Procedures:    Data Reviewed: I have personally reviewed labs and imaging studies    LOS: 2 days  Time spent: Greater than 50% of the 35 minute visit was spent in counseling/coordination of care for the patient as laid out in the A&P.   Dwyane Dee, MD [facility] 07/22/2021, 1:26 PM

## 2021-07-22 NOTE — Progress Notes (Signed)
Inpatient Rehab Admissions Coordinator:   Note PT. Is recommending CIR for this PT. I await OT eval prior to making an assessment of candidacy.  Clemens Catholic, [place], [place] Admissions Coordinator  [phone] ([place]) [phone] (office)

## 2021-07-22 NOTE — Evaluation (Signed)
Physical Therapy Evaluation Patient Details Name: [name] MRN: [id number] DOB: October 09, 1940 Today's Date: 07/22/2021  History of Present Illness  80 yo female admitted with Cdiff, sepsis. Hx of DM, Cdiff, SVt, ACDF 2013, vertigo  Clinical Impression  On eval, pt required Min A for mobility (she may require +2 for amb progression next visit). She was able to take a few steps forwards then backwards with RW in room. Audible wheezing and fatigue with minimal activity on today. Pt presents with general weakness, decreased activity tolerance, and impaired gait and balance. At baseline, pt was Ind and driving. Daughter was present during session and she requested a CIR consult. Will plan to follow and progress activity as tolerated.        Recommendations for follow up therapy are one component of a multi-disciplinary discharge planning process, led by the attending physician.  Recommendations may be updated based on patient status, additional functional criteria and insurance authorization.  Follow Up Recommendations Acute inpatient rehab (3hours/day) (family requesting CIR. If CIR not an option, will need SNF)    Assistance Recommended at Discharge Frequent or constant Supervision/Assistance  Functional Status Assessment Patient has had a recent decline in their functional status and demonstrates the ability to make significant improvements in function in a reasonable and predictable amount of time.  Equipment Recommendations  Rolling walker (2 wheels)    Recommendations for Other Services OT consult     Precautions / Restrictions Precautions Precautions: Fall Restrictions Weight Bearing Restrictions: No      Mobility  Bed Mobility Overal bed mobility: Needs Assistance Bed Mobility: Supine to Sit;Sit to Supine     Supine to sit: HOB elevated;Min assist Sit to supine: HOB elevated;Min assist   General bed mobility comments: Assist for LEs and trunk. Increased time. Moderate  reliance on bedrail with poor trunk control during sit to supine.    Transfers Overall transfer level: Needs assistance Equipment used: Rolling walker (2 wheels) Transfers: Sit to/from Stand Sit to Stand: Min assist;From elevated surface           General transfer comment: x 2. Assist to power up, stabilize, control descent. Cues for safety, technique, hand placement.    Ambulation/Gait Ambulation/Gait assistance: Min assist Gait Distance (Feet): 4 Feet Assistive device: Rolling walker (2 wheels) Gait Pattern/deviations: Step-to pattern;Decreased step length - right;Decreased step length - left       General Gait Details: Pt took a few steps forwards then backwards with RW. Assist to stabilize pt throughout. Audible wheezing with activity. Unsteady. Cues for safety, technique, RW proximity. Fatigued fairly easily on today.  Stairs            Wheelchair Mobility    Modified Rankin (Stroke Patients Only)       Balance Overall balance assessment: Needs assistance         Standing balance support: Bilateral upper extremity supported Standing balance-Leahy Scale: Poor                               Pertinent Vitals/Pain Pain Assessment: Faces Faces Pain Scale: Hurts even more Pain Location: R knee with weightbearing Pain Descriptors / Indicators: Discomfort;Sore Pain Intervention(s): Limited activity within patient's tolerance;Monitored during session;Repositioned    Home Living Family/patient expects to be discharged to:: Private residence Living Arrangements: Alone Available Help at Discharge: Family;Available PRN/intermittently Type of Home: House Home Access: Stairs to enter Entrance Stairs-Rails: None Entrance Stairs-Number of Steps: 1 "big  step"   Home Layout: One level Home Equipment: None      Prior Function Prior Level of Function : Independent/Modified Independent             Mobility Comments: ambulatory without a device.  was driving.       Hand Dominance        Extremity/Trunk Assessment   Upper Extremity Assessment Upper Extremity Assessment: Defer to OT evaluation    Lower Extremity Assessment Lower Extremity Assessment: Generalized weakness    Cervical / Trunk Assessment Cervical / Trunk Assessment: Normal  Communication   Communication: No difficulties  Cognition Arousal/Alertness: Awake/alert Behavior During Therapy: WFL for tasks assessed/performed Overall Cognitive Status: Within Functional Limits for tasks assessed Area of Impairment: Problem solving                             Problem Solving: Requires verbal cues General Comments: some mild problem solving issues (repeated cues, increased time) vs hoh?        General Comments      Exercises     Assessment/Plan    PT Assessment Patient needs continued PT services  PT Problem List Decreased strength;Decreased mobility;Decreased activity tolerance;Decreased balance;Decreased knowledge of use of DME;Pain       PT Treatment Interventions DME instruction;Gait training;Therapeutic activities;Therapeutic exercise;Patient/family education;Balance training;Functional mobility training    PT Goals (Current goals can be found in the Care Plan section)  Acute Rehab PT Goals Patient Stated Goal: family requesting CIR PT Goal Formulation: With patient/family Time For Goal Achievement: 08/05/21 Potential to Achieve Goals: Good    Frequency Min 3X/week   Barriers to discharge        Co-evaluation               AM-PAC PT "6 Clicks" Mobility  Outcome Measure Help needed turning from your back to your side while in a flat bed without using bedrails?: A Little Help needed moving from lying on your back to sitting on the side of a flat bed without using bedrails?: A Little Help needed moving to and from a bed to a chair (including a wheelchair)?: A Little Help needed standing up from a chair using your arms  (e.g., wheelchair or bedside chair)?: A Little Help needed to walk in hospital room?: A Lot Help needed climbing 3-5 steps with a railing? : Total 6 Click Score: 15    End of Session Equipment Utilized During Treatment: Gait belt Activity Tolerance: Patient limited by fatigue Patient left: in bed;with call bell/phone within reach;with family/visitor present   PT Visit Diagnosis: Muscle weakness (generalized) (M62.81);Difficulty in walking, not elsewhere classified (R26.2);History of falling (Z91.81)    Time: 0037-0488 PT Time Calculation (min) (ACUTE ONLY): 29 min   Charges:   PT Evaluation $PT Eval Moderate Complexity: 1 Mod PT Treatments $Gait Training: 8-22 mins           Doreatha Massed, PT Acute Rehabilitation  Office: [phone] Pager: [phone]

## 2021-07-22 NOTE — Plan of Care (Signed)
  Problem: Education: Goal: Knowledge of General Education information will improve Description Including pain rating scale, medication(s)/side effects and non-pharmacologic comfort measures Outcome: Progressing   

## 2021-07-23 ENCOUNTER — Inpatient Hospital Stay (HOSPITAL_COMMUNITY): Payer: Medicare Other

## 2021-07-23 LAB — CBC WITH DIFFERENTIAL/PLATELET
Abs Immature Granulocytes: 0.14 10*3/uL — ABNORMAL HIGH (ref 0.00–0.07)
Basophils Absolute: 0 10*3/uL (ref 0.0–0.1)
Basophils Relative: 0 %
Eosinophils Absolute: 0 10*3/uL (ref 0.0–0.5)
Eosinophils Relative: 0 %
HCT: 33.4 % — ABNORMAL LOW (ref 36.0–46.0)
Hemoglobin: 10.9 g/dL — ABNORMAL LOW (ref 12.0–15.0)
Immature Granulocytes: 1 %
Lymphocytes Relative: 9 %
Lymphs Abs: 1.5 10*3/uL (ref 0.7–4.0)
MCH: 30.2 pg (ref 26.0–34.0)
MCHC: 32.6 g/dL (ref 30.0–36.0)
MCV: 92.5 fL (ref 80.0–100.0)
Monocytes Absolute: 0.9 10*3/uL (ref 0.1–1.0)
Monocytes Relative: 6 %
Neutro Abs: 13.2 10*3/uL — ABNORMAL HIGH (ref 1.7–7.7)
Neutrophils Relative %: 84 %
Platelets: 303 10*3/uL (ref 150–400)
RBC: 3.61 MIL/uL — ABNORMAL LOW (ref 3.87–5.11)
RDW: 12.3 % (ref 11.5–15.5)
WBC: 15.8 10*3/uL — ABNORMAL HIGH (ref 4.0–10.5)
nRBC: 0 % (ref 0.0–0.2)

## 2021-07-23 LAB — BASIC METABOLIC PANEL WITH GFR
Anion gap: 8 (ref 5–15)
BUN: 14 mg/dL (ref 8–23)
CO2: 24 mmol/L (ref 22–32)
Calcium: 7.5 mg/dL — ABNORMAL LOW (ref 8.9–10.3)
Chloride: 104 mmol/L (ref 98–111)
Creatinine, Ser: 0.45 mg/dL (ref 0.44–1.00)
GFR, Estimated: >60 mL/min
Glucose, Bld: 141 mg/dL — ABNORMAL HIGH (ref 70–99)
Potassium: 3.4 mmol/L — ABNORMAL LOW (ref 3.5–5.1)
Sodium: 136 mmol/L (ref 135–145)

## 2021-07-23 LAB — MAGNESIUM: Magnesium: 2.1 mg/dL (ref 1.7–2.4)

## 2021-07-23 MED ORDER — NYSTATIN 100000 UNIT/ML MT SUSP
5.0000 mL | Freq: Four times a day (QID) | OROMUCOSAL | Status: DC
  Administered 2021-07-23 – 2021-07-31 (×29): 500000 [IU] via ORAL
  Filled 2021-07-23 (×27): qty 5

## 2021-07-23 MED ORDER — GABAPENTIN 100 MG PO CAPS
100.0000 mg | ORAL_CAPSULE | Freq: Three times a day (TID) | ORAL | Status: DC
  Administered 2021-07-23 – 2021-07-31 (×24): 100 mg via ORAL
  Filled 2021-07-23 (×24): qty 1

## 2021-07-23 MED ORDER — ENSURE ENLIVE PO LIQD
237.0000 mL | Freq: Three times a day (TID) | ORAL | Status: DC
  Administered 2021-07-23 – 2021-07-31 (×11): 237 mL via ORAL

## 2021-07-23 MED ORDER — IPRATROPIUM-ALBUTEROL 0.5-2.5 (3) MG/3ML IN SOLN
3.0000 mL | Freq: Four times a day (QID) | RESPIRATORY_TRACT | Status: DC
  Administered 2021-07-23: 3 mL via RESPIRATORY_TRACT
  Filled 2021-07-23: qty 3

## 2021-07-23 MED ORDER — LOPERAMIDE HCL 2 MG PO CAPS
2.0000 mg | ORAL_CAPSULE | Freq: Four times a day (QID) | ORAL | Status: DC
  Administered 2021-07-23 – 2021-07-25 (×7): 2 mg via ORAL
  Filled 2021-07-23 (×7): qty 1

## 2021-07-23 MED ORDER — IPRATROPIUM-ALBUTEROL 0.5-2.5 (3) MG/3ML IN SOLN
3.0000 mL | Freq: Two times a day (BID) | RESPIRATORY_TRACT | Status: DC
Start: 2021-07-23 — End: 2021-07-24
  Administered 2021-07-24: 21:00:00 3 mL via RESPIRATORY_TRACT
  Filled 2021-07-23 (×3): qty 3

## 2021-07-23 NOTE — Evaluation (Signed)
Occupational Therapy Evaluation Patient Details Name: [name] MRN: [id number] DOB: 20-Jul-1941 Today's Date: 07/23/2021   History of Present Illness 80 yo female admitted with Cdiff, sepsis. Hx of DM, Cdiff, SVt, ACDF 2013, vertigo   Clinical Impression   This 80 yo female admitted with above presents to acute OT with PLOF of being totally independent with all basic ADLs, IADLs, and driving.  Currently she is setup-total A for basic ADLs and Max A for all mobility. She will continue to benefit from acute OT with follow up on CIR.      Recommendations for follow up therapy are one component of a multi-disciplinary discharge planning process, led by the attending physician.  Recommendations may be updated based on patient status, additional functional criteria and insurance authorization.   Follow Up Recommendations  Acute inpatient rehab (3hours/day)    Assistance Recommended at Discharge Frequent or constant Supervision/Assistance  Functional Status Assessment  Patient has had a recent decline in their functional status and demonstrates the ability to make significant improvements in function in a reasonable and predictable amount of time.  Equipment Recommendations  Other (comment) (TBD next venue)       Precautions / Restrictions Precautions Precautions: Fall Restrictions Weight Bearing Restrictions: No      Mobility Bed Mobility Overal bed mobility: Needs Assistance Bed Mobility: Supine to Sit     Supine to sit: HOB elevated;Max assist          Transfers Overall transfer level: Needs assistance   Transfers: Bed to chair/wheelchair/BSC   Stand pivot transfers: Max assist         General transfer comment: A for sit<>stand and stand pivot      Balance Overall balance assessment: Needs assistance Sitting-balance support: Bilateral upper extremity supported;Feet supported Sitting balance-Leahy Scale: Poor     Standing balance support: Bilateral upper  extremity supported Standing balance-Leahy Scale: Zero                             ADL either performed or assessed with clinical judgement   ADL Overall ADL's : Needs assistance/impaired Eating/Feeding: Set up;Sitting Eating/Feeding Details (indicate cue type and reason): in recliner Grooming: Set up;Sitting Grooming Details (indicate cue type and reason): in recliner Upper Body Bathing: Set up;Sitting Upper Body Bathing Details (indicate cue type and reason): in recliner Lower Body Bathing: Maximal assistance Lower Body Bathing Details (indicate cue type and reason): with Max a sit<>stand Upper Body Dressing : Minimal assistance;Sitting Upper Body Dressing Details (indicate cue type and reason): in recliner Lower Body Dressing: Total assistance Lower Body Dressing Details (indicate cue type and reason): Max A sit<>stand Toilet Transfer: Maximal Scientist, forensic Details (indicate cue type and reason): bed>recliner on her right ToiletingRunner, broadcasting/film/video and Hygiene: Total assistance Toileting - Clothing Manipulation Details (indicate cue type and reason): Max A sit<>stand             Vision Patient Visual Report: No change from baseline              Pertinent Vitals/Pain Pain Assessment: No/denies pain     Hand Dominance Right   Extremity/Trunk Assessment Upper Extremity Assessment Upper Extremity Assessment: Generalized weakness           Communication Communication Communication: No difficulties   Cognition Arousal/Alertness: Awake/alert Behavior During Therapy: WFL for tasks assessed/performed Overall Cognitive Status: Within Functional Limits for tasks assessed  Home Living Family/patient expects to be discharged to:: Inpatient rehab Living Arrangements: Alone Available Help at Discharge: Family;Available PRN/intermittently Type of Home:  House Home Access: Stairs to enter [employer] of Steps: 1 "big step" Entrance Stairs-Rails: None Home Layout: One level     Bathroom Shower/Tub: Tub/shower unit;Walk-in shower   Bathroom Toilet: Standard     Home Equipment: None          Prior Functioning/Environment Prior Level of Function : Independent/Modified Independent;Driving             Mobility Comments: ambulatory without a device. was driving.          OT Problem List: Decreased strength;Impaired balance (sitting and/or standing);Decreased activity tolerance      OT Treatment/Interventions: Self-care/ADL training;DME and/or AE instruction;Patient/family education;Balance training    OT Goals(Current goals can be found in the care plan section) Acute Rehab OT Goals Patient Stated Goal: to feel better, get stronger and go home OT Goal Formulation: With patient Time For Goal Achievement: 08/06/21 Potential to Achieve Goals: Good  OT Frequency: Min 2X/week   Barriers to D/C: Decreased caregiver support             AM-PAC OT "6 Clicks" Daily Activity     Outcome Measure Help from another person eating meals?: None Help from another person taking care of personal grooming?: A Little Help from another person toileting, which includes using toliet, bedpan, or urinal?: A Lot Help from another person bathing (including washing, rinsing, drying)?: A Lot Help from another person to put on and taking off regular upper body clothing?: A Little Help from another person to put on and taking off regular lower body clothing?: Total 6 Click Score: 15   End of Session Equipment Utilized During Treatment: Gait belt Nurse Communication: Mobility status (appears weaker than yesterday and pt reports she feels weaker; recommend using sara stedy for back to bed)  Activity Tolerance: Patient limited by fatigue Patient left: in chair;with call bell/phone within reach (asked RN and no chair alarm needed)  OT  Visit Diagnosis: Other abnormalities of gait and mobility (R26.89);Unsteadiness on feet (R26.81);Muscle weakness (generalized) (M62.81)                Time: 7341-9379 OT Time Calculation (min): 31 min Charges:  OT General Charges $OT Visit: 1 Visit OT Evaluation $OT Eval Moderate Complexity: 1 Mod OT Treatments $Self Care/Home Management : 8-22 mins Golden Circle, OTR/L Acute [employer] Pager [phone] Office [phone]    Almon Register 07/23/2021, 10:08 AM

## 2021-07-23 NOTE — Plan of Care (Signed)

## 2021-07-23 NOTE — Plan of Care (Signed)
°  Problem: Education: Goal: Knowledge of General Education information will improve Description: Including pain rating scale, medication(s)/side effects and non-pharmacologic comfort measures Outcome: Progressing   Problem: Clinical Measurements: Goal: Diagnostic test results will improve Outcome: Progressing Goal: Respiratory complications will improve Outcome: Progressing   Problem: Activity: Goal: Risk for activity intolerance will decrease Outcome: Progressing   Problem: Nutrition: Goal: Adequate nutrition will be maintained Outcome: Progressing   Problem: Safety: Goal: Ability to remain free from injury will improve Outcome: Progressing   Problem: Skin Integrity: Goal: Risk for impaired skin integrity will decrease Outcome: Progressing

## 2021-07-23 NOTE — Care Management Important Message (Signed)
Important Message  Patient Details IM Letter given to the Patient. Name: [name] MRN: [id number] Date of Birth: 02-May-1941   Medicare Important Message Given:  Yes     Kerin Salen 07/23/2021, 1:34 PM

## 2021-07-23 NOTE — Progress Notes (Signed)
Inpatient Rehab Admissions Coordinator:   Per therapy recommendations, patient was screened for CIR candidacy by Clemens Catholic, MS, CCC-SLP. At this time, Pt. does not appear to demonstrate medical necessity to justify in hospital rehabilitation/CIR. CIR beds are also very limited this week. I will not pursue a rehab consult for this Pt.   Recommend other rehab venues to be pursued.  Please contact me with any questions.  Clemens Catholic, [place], [place] Admissions Coordinator  [phone] ([place]) [phone] (office)

## 2021-07-23 NOTE — Progress Notes (Signed)
PROGRESS NOTE    [name]  EZM:[id number] DOB: 1941/03/06 DOA: 07/20/2021 PCP: [name], MD   Chief Complain: Diarrhea  Brief Narrative: Patient is a 80 year old female with history of depression/anxiety, CKD stage II, GERD, hyperlipidemia, hypertension, osteopenia who presented with cough, ongoing diarrhea.  She had recently completed a course of amoxicillin for respiratory infection after that she started having diarrhea.  C. difficile was positive and she was treated with oral vancomycin.  After completion of oral vancomycin, she was still having diarrhea and was prescribed fidaxomicin.  She presented to the hospital because she was not feeling well and she was having continuous diarrhea.  CT abdomen/pelvis did not show any colitis but showed diverticulosis.  C. difficile testing has been negative here.   PT/OT recommending CIR on discharge.  Assessment & Plan:   Principal Problem:   C. difficile diarrhea Active Problems:   Hyperlipidemia associated with type 2 diabetes mellitus ([place])   Hypertension associated with diabetes ([place])   GERD (gastroesophageal reflux disease)   Diabetes mellitus without complication (HCC)   Sepsis ([place])   CKD (chronic kidney disease) stage 2, GFR 60-89 ml/min   Hypokalemia   Diarrhea post C. difficile: Found to have C. difficile recently after finishing a course of amoxicillin.  Treated with vancomycin.After completion of oral vancomycin, she was still having diarrhea and was prescribed fidaxomicin.  She presented to the hospital because she was not feeling well and she was having continuous diarrhea.  CT abdomen/pelvis did not show any colitis but showed diverticulosis.  C. difficile testing has been negative here.  She was started on fidaxomicin as an outpatient and the plan is to continue for 10 days total Diarrhea might have slowed down.  Her last bowel movement was at 3 AM, which was loose.  Continue gentle IV fluids. Monitor  electrolytes  Hypokalemia: Continue to monitor and supplement.  On scheduled dose of potassium.  Suspected sepsis: Presented with tachycardia, tachypnea, leukocytosis.  Sepsis physiology has improved.  Continues to have leukocytosis,likely reactive.  CKD stage II: Currently kidney function at baseline.  Diabetes type 2: Recent hemoglobin A1c of 6.1.  Continue monitoring blood sugars.  Hypertension: Takes Cardizem, hydrochlorothiazide, valsartan at home.  Currently on hold  Hyperlipidemia: On Lipitor at home.  Anxiety: On Xanax, Lexapro  Stridor/upper respiratory wheezing: Noticed this morning.  Her lungs are clear.  Continue bronchodilators.  If she continues to have worsening stridor/upper respiratory wheezing, will consider steroid treatment.  Debility/deconditioning/weakness: Lives alone.  Usually  ambulatory, self dependent, drives car.  Currently deconditioned.  PT/OT recommended acute rehab on discharge.         DVT prophylaxis: Lovenox Code Status: Full code Family Communication: None present at the bedside Patient status: Inpatient  Dispo: The patient is from: Home              Anticipated d/c is to: CIR              Anticipated d/c date is: In the 1-2 days, need to wait for resolution of diarrhea  Consultants: None  Procedures: None Antimicrobials:  Anti-infectives (From admission, onward)    Start     Dose/Rate Route Frequency Ordered Stop   07/21/21 1000  doxycycline (VIBRA-TABS) tablet 100 mg        100 mg Oral Every 12 hours 07/21/21 0410     07/20/21 2200  fidaxomicin (DIFICID) tablet 200 mg        200 mg Oral 2 times daily 07/20/21  1818 07/30/21 2159       Subjective:  Patient seen and examined at the bedside this morning.  Sitting on the chair.  She was working with OT.  Looks very weak, deconditioned.  Also noticed to have audible wheezing.  Denies shortness of breath, on room air.  Had loose bowel movement this morning at 3  AM  Objective: Vitals:   07/21/21 1932 07/22/21 0402 07/22/21 2146 07/23/21 0551  BP: (!) 168/88 140/85 (!) 146/74 (!) 148/77  Pulse: (!) 103 95 99 100  Resp: 20 20 20 16   Temp: 100.3 F (37.9 C) 99.2 F (37.3 C) 98 F (36.7 C) 98.2 F (36.8 C)  TempSrc: Oral Oral Oral Oral  SpO2: 94% 95% 96% 97%  Weight:      Height:        Intake/Output Summary (Last 24 hours) at 07/23/2021 0811 Last data filed at 07/22/2021 1050 Gross per 24 hour  Intake 240 ml  Output --  Net 240 ml   Filed Weights   07/21/21 0300  Weight: 53.1 kg    Examination:  General exam: Weak, deconditioned HEENT: PERRL Respiratory system:  no wheezes or crackles  Cardiovascular system: S1 & S2 heard, RRR.  Gastrointestinal system: Abdomen is nondistended, soft and nontender. Central nervous system: Alert and oriented Extremities: No edema, no clubbing ,no cyanosis Skin: No rashes, no ulcers,no icterus      Data Reviewed: I have personally reviewed following labs and imaging studies  CBC: Recent Labs  Lab 07/20/21 1155 07/21/21 0433 07/22/21 0354 07/23/21 0403  WBC 12.0* 14.1* 17.6* 15.8*  NEUTROABS  --   --  14.6* 13.2*  HGB 12.3 11.6* 10.6* 10.9*  HCT 37.9 35.2* 32.2* 33.4*  MCV 94.5 92.6 92.8 92.5  PLT 347 265 295 035   Basic Metabolic Panel: Recent Labs  Lab 07/20/21 1155 07/20/21 2302 07/21/21 0433 07/22/21 0354 07/23/21 0403  NA 133* 137 137 136 136  K 2.4* 2.7* 3.1* 3.0* 3.4*  CL 91* 99 99 101 104  CO2 30 29 28 25 24   GLUCOSE 209* 132* 133* 143* 141*  BUN 9 6* 7* 13 14  CREATININE 0.69 0.53 0.52 0.42* 0.45  CALCIUM 8.6* 7.2* 7.7* 7.5* 7.5*  MG 1.6*  --   --  1.9 2.1  PHOS  --  1.6*  --   --   --    GFR: Estimated Creatinine Clearance: 40.9 mL/min (by C-G formula based on SCr of 0.45 mg/dL). Liver Function Tests: Recent Labs  Lab 07/20/21 1155 07/20/21 2302 07/21/21 0433  AST 20  --  17  ALT 14  --  12  ALKPHOS 54  --  53  BILITOT 0.8  --  1.4*  PROT 6.9  --   6.5  ALBUMIN 3.4* 2.8* 3.0*   No results for input(s): LIPASE, AMYLASE in the last 168 hours. No results for input(s): AMMONIA in the last 168 hours. Coagulation Profile: Recent Labs  Lab 07/21/21 0433  INR 1.2   Cardiac Enzymes: No results for input(s): CKTOTAL, CKMB, CKMBINDEX, TROPONINI in the last 168 hours. BNP (last 3 results) No results for input(s): PROBNP in the last 8760 hours. HbA1C: Recent Labs    07/21/21 0433  HGBA1C 6.1*   CBG: No results for input(s): GLUCAP in the last 168 hours. Lipid Profile: No results for input(s): CHOL, HDL, LDLCALC, TRIG, CHOLHDL, LDLDIRECT in the last 72 hours. Thyroid Function Tests: No results for input(s): TSH, T4TOTAL, FREET4, T3FREE, THYROIDAB in the last  72 hours. Anemia Panel: No results for input(s): VITAMINB12, FOLATE, FERRITIN, TIBC, IRON, RETICCTPCT in the last 72 hours. Sepsis Labs: Recent Labs  Lab 07/21/21 0433 07/21/21 0706  PROCALCITON 0.18  --   LATICACIDVEN 1.5 1.2    Recent Results (from the past 240 hour(s))  Resp Panel by RT-PCR (Flu A&B, Covid) Nasopharyngeal Swab     Status: None   Collection Time: 07/20/21 11:40 AM   Specimen: Nasopharyngeal Swab; Nasopharyngeal(NP) swabs in vial transport medium  Result Value Ref Range Status   SARS Coronavirus 2 by RT PCR NEGATIVE NEGATIVE Final    Comment: (NOTE) SARS-CoV-2 target nucleic acids are NOT DETECTED.  The SARS-CoV-2 RNA is generally detectable in upper respiratory specimens during the acute phase of infection. The lowest concentration of SARS-CoV-2 viral copies this assay can detect is 138 copies/mL. A negative result does not preclude SARS-Cov-2 infection and should not be used as the sole basis for treatment or other patient management decisions. A negative result may occur with  improper specimen collection/handling, submission of specimen other than nasopharyngeal swab, presence of viral mutation(s) within the areas targeted by this assay, and  inadequate number of viral copies(<138 copies/mL). A negative result must be combined with clinical observations, patient history, and epidemiological information. The expected result is Negative.  Fact Sheet for Patients:  EntrepreneurPulse.com.au  Fact Sheet for Healthcare Providers:  IncredibleEmployment.be  This test is no t yet approved or cleared by the [place] FDA and  has been authorized for detection and/or diagnosis of SARS-CoV-2 by FDA under an Emergency Use Authorization (EUA). This EUA will remain  in effect (meaning this test can be used) for the duration of the COVID-19 declaration under Section 564(b)(1) of the Act, 21 U.S.C.section 360bbb-3(b)(1), unless the authorization is terminated  or revoked sooner.       Influenza A by PCR NEGATIVE NEGATIVE Final   Influenza B by PCR NEGATIVE NEGATIVE Final    Comment: (NOTE) The Xpert Xpress SARS-CoV-2/FLU/RSV plus assay is intended as an aid in the diagnosis of influenza from Nasopharyngeal swab specimens and should not be used as a sole basis for treatment. Nasal washings and aspirates are unacceptable for Xpert Xpress SARS-CoV-2/FLU/RSV testing.  Fact Sheet for Patients: EntrepreneurPulse.com.au  Fact Sheet for Healthcare Providers: IncredibleEmployment.be  This test is not yet approved or cleared by the [place] FDA and has been authorized for detection and/or diagnosis of SARS-CoV-2 by FDA under an Emergency Use Authorization (EUA). This EUA will remain in effect (meaning this test can be used) for the duration of the COVID-19 declaration under Section 564(b)(1) of the Act, 21 U.S.C. section 360bbb-3(b)(1), unless the authorization is terminated or revoked.  Performed at [facility], [place] [address] [place], [place] [address]   C Difficile Quick Screen w PCR reflex     Status: None   Collection Time:  07/21/21  4:48 PM   Specimen: STOOL  Result Value Ref Range Status   C Diff antigen NEGATIVE NEGATIVE Final   C Diff toxin NEGATIVE NEGATIVE Final   C Diff interpretation No C. difficile detected.  Final    Comment: Performed at [facility], [place] [address], [place], [place] [address]         Radiology Studies: DG Knee 1-2 Views Right  Result Date: 07/21/2021 CLINICAL DATA:  Fall, right knee pain EXAM: RIGHT KNEE - 1-2 VIEW COMPARISON:  None. FINDINGS: Chondrocalcinosis. Degenerative joint disease with joint space narrowing and spurring. Small joint effusion.  No acute bony abnormality. Specifically, no fracture, subluxation, or dislocation. Soft tissues are intact. IMPRESSION: No acute bony abnormality. Chondrocalcinosis, degenerative changes and small joint effusion. Electronically Signed   By: Rolm Baptise M.D.   On: 07/21/2021 20:16   CT ABDOMEN PELVIS W CONTRAST  Result Date: 07/22/2021 CLINICAL DATA:  Diffuse abdominal pain and diarrhea for 1 month, C difficile infection EXAM: CT ABDOMEN AND PELVIS WITH CONTRAST TECHNIQUE: Multidetector CT imaging of the abdomen and pelvis was performed using the standard protocol following bolus administration of intravenous contrast. CONTRAST:  76mL OMNIPAQUE IOHEXOL 350 MG/ML SOLN COMPARISON:  06/24/2019 FINDINGS: Lower chest: There are trace bilateral pleural effusions. No acute airspace disease. Hepatobiliary: No focal liver abnormality is seen. No gallstones, gallbladder wall thickening, or biliary dilatation. Pancreas: Unremarkable. No pancreatic ductal dilatation or surrounding inflammatory changes. Spleen: Normal in size without focal abnormality. Adrenals/Urinary Tract: Stable nonspecific bilateral adrenal thickening. There are bilateral nonobstructing renal calculi unchanged, largest in the lower pole right kidney measuring 10 mm and largest in the upper pole left kidney measuring less than 2 mm. No obstructive uropathy within  either kidney. Stable right renal cortical scarring and scattered bilateral renal cortical hypodensities compatible with cysts. Bladder is unremarkable. Stomach/Bowel: No bowel obstruction or ileus. There is diffuse colonic diverticulosis without evidence of acute diverticulitis. Normal appendix right lower quadrant. No bowel wall thickening or inflammatory change. Vascular/Lymphatic: Aortic atherosclerosis. No enlarged abdominal or pelvic lymph nodes. Reproductive: Uterus and bilateral adnexa are unremarkable. Other: No free fluid or free gas. Small fat containing umbilical hernia unchanged. No bowel herniation. Musculoskeletal: No acute or destructive bony lesions. Extensive multilevel thoracolumbar spondylosis. Reconstructed images demonstrate no additional findings. IMPRESSION: 1. Diffuse colonic diverticulosis without diverticulitis. No inflammatory changes to suggest C difficile colitis. 2. Bilateral nonobstructing renal calculi, largest in the right kidney measuring 10 mm. 3. Trace bilateral pleural effusions. 4.  Aortic Atherosclerosis (ICD10-I70.0). Electronically Signed   By: Randa Ngo M.D.   On: 07/22/2021 16:42        Scheduled Meds:  diltiazem  240 mg Oral Daily   dorzolamide-timolol  1 drop Both Eyes BID   doxycycline  100 mg Oral Q12H   enoxaparin (LOVENOX) injection  40 mg Subcutaneous Q24H   escitalopram  20 mg Oral Daily   fidaxomicin  200 mg Oral BID   fluticasone  2 spray Each Nare Daily   guaiFENesin  600 mg Oral BID   ipratropium-albuterol  3 mL Nebulization Q6H   potassium chloride  40 mEq Oral BID   Continuous Infusions:  sodium chloride 75 mL/hr at 07/21/21 2152     LOS: 3 days    Time spent:35 mins. More than 50% of that time was spent in counseling and/or coordination of care.      Shelly Coss, MD [facility] P12/13/2022, 8:11 AM

## 2021-07-23 NOTE — Consult Note (Addendum)
Consultation  Referring Provider: TRH/Adhikari MD Primary Care Physician:  [name], MD Primary Gastroenterologist:  Dr.Gessner  Reason for Consultation:   Diarrhea  HPI: [name] is a 80 y.o. female, known to Dr. Carlean Purl, with history of diverticulosis, adenomatous colon polyps, component of chronic diarrhea which have been mild, GERD with previous esophageal stricture, adult onset diabetes mellitus, chronic kidney disease, glaucoma and hypertension. Patient was admitted on 07/20/2021 from home with progressive profound weakness to the point that she was unable to ambulate.  She initially became ill in mid November after she had been prescribed amoxicillin for a cough and complaints of weakness.  After taking the amoxicillin for about 5 days she developed severe diarrhea which has persisted ever since.  Stool GI path panel was done and she was positive for C. difficile on 06/28/2021, by PCP.  Dr. Carlean Purl became involved and she was given a 2-week course of oral vancomycin.  She says she did not be improved with the vancomycin and called back and she was started on Dificid which had been called in on 07/15/2021.  She says she pay $1500 for this and after 2 doses she was so weak that her neighbor brought her to the emergency room. She says she has not been eating well over the past 3 weeks, does not have much appetite has had some queasiness but no vomiting.  No documented fever chills or rigors.  She says food tastes bad, she not having any significant abdominal pain but does have cramping with the episodes of diarrhea.  She is generally been having 8-10 bowel movements per day of pure liquid stool which has been malodorous and nonbloody.  She says that she has urgent diarrhea usually immediately after anything that she eats or drinks. She has been placed on Dificid here twice daily. On admit, meets SIRS sepsis criteria with WBC of 12.0, associated tachypnea and tachycardia. She has  had persistent problems with hypokalemia which is being replaced. On admit lactate 1.5, procalcitonin within normal limits. On 07/21/2021 C. difficile quick screen was done and negative. On 07/22/2021 WBC up to 17.6/hemoglobin 10.6/hematocrit 32.2 Potassium 3.0, BUN 13/creatinine 0.4  Today WBC 15.8, hemoglobin 10.9/hematocrit 33.4 Potassium 3.4, creatinine 0.45  She says she is trying to eat but just still does not have any appetite, has had at least 5 episodes of urgent diarrheal stools today and had 1 while I was in the room.,  No warning.  She is very worried and scared because she says she cannot walk.  I do not believe physical therapy has been seeing her daily.  Regarding her issues with chronic diarrhea she says over the past 6 months or so she generally will have 1-2 bowel movements per day which are on the looser side. She had colonoscopy in May 2022 showing multiple diverticuli, 1 2 mm polyp which was a tubular adenoma and random biopsies were done which were negative for any evidence of microscopic colitis EGD also done May 2022 with finding of 2 cm hiatal hernia somewhat tortuous esophagus and was empirically dilated to 54 [place].   Past Medical History:  Diagnosis Date   Adenomatous colon polyp 2006   Anxiety    Arthritis    Benign esophageal stricture 10/14/2012   CERUMEN IMPACTION, BILATERAL 12/13/2007   CERVICAL RADICULOPATHY 11/05/2009   Chronic kidney disease    kidney stones   DEPRESSION 03/11/2007   DIVERTICULOSIS, COLON 03/11/2007   Esophagitis, reflux 10/14/2012   GERD (gastroesophageal  reflux disease) 10/14/2012   Glaucoma    GLAUCOMA ASSOCIATED W/UNSPEC OCULAR DISORDER 11/05/2009   Hearing loss    HEMORRHOIDS, EXTERNAL W/O COMPLICATION 3/50/0938   Qualifier: Diagnosis of  By: Jimmye Norman, LPN, Bonnye M    HYPERLIPIDEMIA 09/22/2007   HYPERTENSION 03/11/2007   Kidney stone    Osteopenia 02/2015   T score -1.4 FRAX 7.3%/0.6%   Rectocele    Rosacea 03/11/2007    Tachycardia     Past Surgical History:  Procedure Laterality Date   ANTERIOR CERVICAL DECOMP/DISCECTOMY FUSION  08/14/2011   Procedure: ANTERIOR CERVICAL DECOMPRESSION/DISCECTOMY FUSION 1 LEVEL/HARDWARE REMOVAL;  Surgeon: [name], MD;  Location: [facility];  Service: Neurosurgery;  Laterality: N/A;  Cervical Six-Seven anterior cervical decompression with fusion interbody prothesis plating and bonegraft with removal of hardware of Cervical Four to Cervical Six    CERVICAL FUSION     C4-6   COLONOSCOPY     multiple   ESOPHAGOGASTRODUODENOSCOPY  10/14/2012   Dr. [name]   KIDNEY SURGERY     age 27   TONSILLECTOMY      Prior to Admission medications   Medication Sig Start Date End Date Taking? Authorizing Provider  acetaminophen (TYLENOL) 325 MG tablet Take 650 mg by mouth every 6 (six) hours as needed for moderate pain.   Yes [provider]  albuterol (VENTOLIN HFA) 108 (90 Base) MCG/ACT inhaler Inhale 2 puffs into the lungs every 6 (six) hours as needed for wheezing or shortness of breath (or cough). 07/16/21  Yes [name], MD  ALPRAZolam Duanne Moron) 0.25 MG tablet TAKE 1 TABLET (0.25 MG TOTAL) BY MOUTH DAILY AS NEEDED. FOR ANXIETY Patient taking differently: Take 0.25 mg by mouth daily as needed for anxiety. 11/22/20  Yes [name], MD  benzonatate (TESSALON) 100 MG capsule Take 100 mg by mouth 2 (two) times daily as needed for cough.   Yes [provider]  Calcium Carbonate-Vitamin D (CALCIUM + D PO) Take 1 tablet by mouth daily.   Yes [provider]  diltiazem (CARDIZEM CD) 240 MG 24 hr capsule TAKE 1 CAPSULE BY MOUTH EVERY DAY Patient taking differently: 240 mg daily. 06/07/21  Yes [name], MD  dorzolamide-timolol (COSOPT) 22.3-6.8 MG/ML ophthalmic solution Place 1 drop into both eyes 2 (two) times daily. 06/29/17  Yes [provider]  escitalopram (LEXAPRO) 20 MG tablet TAKE 1 TABLET BY MOUTH EVERY DAY Patient  taking differently: Take 20 mg by mouth daily. 07/15/21  Yes [name], MD  fidaxomicin (DIFICID) 200 MG TABS tablet Take 1 tablet (200 mg total) by mouth 2 (two) times daily for 10 days. 07/15/21 07/25/21 Yes Gatha Mayer, MD  fluticasone (FLONASE) 50 MCG/ACT nasal spray Place 2 sprays into both nostrils daily. 07/16/21  Yes [name], MD  hydrochlorothiazide (MICROZIDE) 12.5 MG capsule TAKE 1 CAPSULE BY MOUTH EVERY DAY Patient taking differently: Take 12.5 mg by mouth daily. 06/06/21  Yes Allred, Jeneen Rinks, MD  pantoprazole (PROTONIX) 20 MG tablet Take 1 tablet (20 mg total) by mouth 2 (two) times daily. Patient taking differently: Take 20 mg by mouth daily. 07/16/21  Yes [name], MD  atorvastatin (LIPITOR) 20 MG tablet Take 1 tablet (20 mg total) by mouth 3 (three) times a week. Patient not taking: Reported on 07/20/2021 02/08/21   [name], MD  meclizine (ANTIVERT) 25 MG tablet Take 1 tablet (25 mg total) by mouth 3 (three) times daily as needed for dizziness or  nausea (vertigo). Patient not taking: Reported on 07/20/2021 03/14/20   Truddie Hidden, MD  ondansetron (ZOFRAN) 4 MG tablet Take 1 tablet (4 mg total) by mouth every 8 (eight) hours as needed for nausea or vomiting. Patient not taking: Reported on 07/20/2021 03/09/20   Inda Coke, PA  valsartan (DIOVAN) 320 MG tablet TAKE 1 TABLET BY MOUTH EVERY DAY 04/01/21   [name], MD    Current Facility-Administered Medications  Medication Dose Route Frequency Provider Last Rate Last Admin   0.9 %  sodium chloride infusion   Intravenous Continuous Dwyane Dee, MD 75 mL/hr at 07/23/21 1523 New Bag at 07/23/21 1523   acetaminophen (TYLENOL) tablet 650 mg  650 mg Oral Q6H PRN Cherylann Ratel A, DO       Or   acetaminophen (TYLENOL) suppository 650 mg  650 mg Rectal Q6H PRN Marylyn Ishihara, Tyrone A, DO       ALPRAZolam Duanne Moron) tablet 0.25 mg  0.25 mg Oral Daily PRN Marylyn Ishihara, Tyrone A, DO   0.25 mg at 07/22/21 2123    diltiazem (CARDIZEM CD) 24 hr capsule 240 mg  240 mg Oral Daily Kyle, Tyrone A, DO   240 mg at 07/23/21 0934   dorzolamide-timolol (COSOPT) 22.3-6.8 MG/ML ophthalmic solution 1 drop  1 drop Both Eyes BID Marylyn Ishihara, Tyrone A, DO   1 drop at 07/23/21 0934   enoxaparin (LOVENOX) injection 40 mg  40 mg Subcutaneous Q24H Marylyn Ishihara, Tyrone A, DO   40 mg at 07/23/21 0934   escitalopram (LEXAPRO) tablet 20 mg  20 mg Oral Daily Marylyn Ishihara, Tyrone A, DO   20 mg at 07/23/21 7989   fidaxomicin (DIFICID) tablet 200 mg  200 mg Oral BID Marylyn Ishihara, Tyrone A, DO   200 mg at 07/23/21 0934   fluticasone (FLONASE) 50 MCG/ACT nasal spray 2 spray  2 spray Each Nare Daily Marylyn Ishihara, Tyrone A, DO   2 spray at 07/23/21 0935   gabapentin (NEURONTIN) capsule 100 mg  100 mg Oral TID Shelly Coss, MD   100 mg at 07/23/21 1513   guaiFENesin (MUCINEX) 12 hr tablet 600 mg  600 mg Oral BID Marylyn Ishihara, Tyrone A, DO   600 mg at 07/23/21 2119   ipratropium-albuterol (DUONEB) 0.5-2.5 (3) MG/3ML nebulizer solution 3 mL  3 mL Nebulization BID Shelly Coss, MD       metoprolol tartrate (LOPRESSOR) injection 5 mg  5 mg Intravenous Q6H PRN Marylyn Ishihara, Tyrone A, DO       ondansetron (ZOFRAN) tablet 4 mg  4 mg Oral Q6H PRN Marylyn Ishihara, Tyrone A, DO       Or   ondansetron (ZOFRAN) injection 4 mg  4 mg Intravenous Q6H PRN Marylyn Ishihara, Tyrone A, DO       potassium chloride SA (KLOR-CON M) CR tablet 40 mEq  40 mEq Oral BID Marylyn Ishihara, Tyrone A, DO   40 mEq at 07/23/21 0934   traMADol (ULTRAM) tablet 50 mg  50 mg Oral Q6H PRN Marylyn Ishihara, Tyrone A, DO   50 mg at 07/23/21 1512    Allergies as of 07/20/2021 - Review Complete 07/20/2021  Allergen Reaction Noted   Codeine Nausea Only 03/11/2007    Family History  Problem Relation Age of Onset   Mental illness Mother        comitted suicide   Glaucoma Mother    Lung cancer Father        smoker   Hypertension Father    Heart disease Father  age 44, smoker   Cerebral palsy Sister    Stroke Sister        multiple   Diabetes Maternal  Grandmother    Stroke Paternal Aunt    Stroke Paternal Uncle    Stroke Paternal Uncle    Stroke Paternal Uncle    Stroke Paternal Aunt    Pancreatic cancer Daughter    Heart attack Son        50   Heart attack Son        45   Colon cancer Neg Hx    Stomach cancer Neg Hx    Esophageal cancer Neg Hx    Rectal cancer Neg Hx     Social History   Socioeconomic History   Marital status: Widowed    Spouse name: Not on file   Number of children: Not on file   Years of education: Not on file   Highest education level: Not on file  Occupational History   Occupation: retired    Fish farm manager: RETIRED  Tobacco Use   Smoking status: Never   Smokeless tobacco: Never  Vaping Use   Vaping Use: Never used  Substance and Sexual Activity   Alcohol use: No    Alcohol/week: 0.0 standard drinks   Drug use: No   Sexual activity: Never    Birth control/protection: Post-menopausal    Comment: 1st intercourse 80 yo-Fewer than 5 partners  Other Topics Concern   Not on file  Social History Narrative   Lives alone. Widowed. 2 living children (lost son to MI 2019 and daughter to pancreatic cancer 2020, another son MI 2021) and 7 grandchildren. 3 greatgrandkids      Retired from [employer] after 5 years and later took care of children.       Hobbies: time with grandkids, shopping, go out to eat. Travels once a year with girlfriends.    Social Determinants of Health   Financial Resource Strain: Not on file  Food Insecurity: Not on file  Transportation Needs: Not on file  Physical Activity: Not on file  Stress: Not on file  Social Connections: Not on file  Intimate Partner Violence: Not on file    Review of Systems: Pertinent positive and negative review of systems were noted in the above HPI section.  All other review of systems was otherwise negative.   Physical Exam: Vital signs in last 24 hours: Temp:  [98 F (36.7 C)-98.5 F (36.9 C)] 98.5 F (36.9 C) (12/13 1509) Pulse Rate:  [99-105]  105 (12/13 1509) Resp:  [16-20] 18 (12/13 1509) BP: (146-160)/(74-97) 160/97 (12/13 1509) SpO2:  [96 %-98 %] 97 % (12/13 1509) Last BM Date: 07/22/21 (per pt) General:   Alert,  Well-developed, acutely ill elderly white female pleasant and cooperative in NAD Head:  Normocephalic and atraumatic. Eyes:  Sclera clear, no icterus.   Conjunctiva pink. Ears:  Normal auditory acuity. Nose:  No deformity, discharge,  or lesions. Mouth:  No deformity or lesions.  Tongue is coated, mouth dry Neck:  Supple; no masses or thyromegaly. Lungs:  Clear throughout to auscultation.   No wheezes, crackles, or rhonchi.  Few upper airway expiratory wheezes Heart: Tachy regular rate and rhythm; no murmurs, clicks, rubs,  or gallops. Abdomen:  Soft,nontender, BS active,nonpalp mass or hsm.   Rectal: Not done Msk:  Symmetrical without gross deformities. . Pulses:  Normal pulses noted. Extremities:  Without clubbing or edema. Neurologic:  Alert and  oriented x4;  grossly normal neurologically. Skin:  Intact  without significant lesions or rashes.. Psych:  Alert and cooperative. Normal mood and affect.  Intake/Output from previous day: 12/12 0701 - 12/13 0700 In: 240 [P.O.:240] Out: -  Intake/Output this shift: No intake/output data recorded.  Lab Results: Recent Labs    07/21/21 0433 07/22/21 0354 07/23/21 0403  WBC 14.1* 17.6* 15.8*  HGB 11.6* 10.6* 10.9*  HCT 35.2* 32.2* 33.4*  PLT 265 295 303   BMET Recent Labs    07/21/21 0433 07/22/21 0354 07/23/21 0403  NA 137 136 136  K 3.1* 3.0* 3.4*  CL 99 101 104  CO2 28 25 24   GLUCOSE 133* 143* 141*  BUN 7* 13 14  CREATININE 0.52 0.42* 0.45  CALCIUM 7.7* 7.5* 7.5*   LFT Recent Labs    07/21/21 0433  PROT 6.5  ALBUMIN 3.0*  AST 17  ALT 12  ALKPHOS 53  BILITOT 1.4*   PT/INR Recent Labs    07/21/21 0433  LABPROT 15.0  INR 1.2   Hepatitis Panel No results for input(s): HEPBSAG, HCVAB, HEPAIGM, HEPBIGM in the last 72  hours.   IMPRESSION:  #53 80 year old white female admitted 07/20/2021 with progressive/profound weakness with inability to ambulate that day, after being ill over the past 3 weeks. She had developed diarrhea after a short course of amoxicillin given mid November for cough. Tested positive for C. difficile on 06/28/2021-completed a 14-day course of vancomycin with some mild improvement in symptoms but no clear resolution then started on Dificid 07/15/2021.  Sounds like she only had 2 doses of this prior to admission.  Course is consistent with persistent C. difficile colitis with sepsis/SIRS, despite negative C. difficile quick screen She has only been on Dificid for 3 days at this point-it may take a few more days to determine efficacy, and she may require a longer course than standard 10 days  component of mild chronic loose stools-colonoscopy with random biopsies negative May 2022  #2 hypokalemia secondary to above-replacing #3 acute kidney injury on chronic kidney disease improving #4 GERD-stable #5.  History of hypertension #6.  Adult onset diabetes mellitus #7.  History of adenomatous colon polyps and diverticulosis #8-probable oral thrush secondary to antibiotics    PLAN: #1 at this time would plan to continue 10-day course of Dificid twice daily #2 start Imodium 2 mg 4 times daily, can titrate #3 start nystatin oral suspension 5 cc swish and spit 4 times daily for probable oral thrush #4 continue IV fluids and electrolyte replacement #5 bland diet, start Ensure/vanilla 3 times daily between meals #6 trend CRP #7 can consider sigmoidoscopy if patient fails to improve #8 resume physical therapy for debilitation/ambulation as soon as she is able to participate  GI will follow with you  Amy Esterwood PA-C 07/23/2021, 3:37 PM    ________________________________________________________________________  Velora Heckler GI MD note:  I personally examined the patient, reviewed the data  and agree with the assessment and plan described above. SHe has baseline chronic diarrhea, underwent a colonsocopy 7 months ago and random biopsies were normal (no microscopic colitis). Then she caught C. Diff following Amoxicillin for URI, cough.  Vancomycin helped a bit but after stopping she was back to terrible frequency.  Repeat C. Diff toxin was negative, dificid started. She is still very miserable with diarrhea.  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 Diff. I favor post infectious IBS, diarrhea flare.  She only had ONE BM overnight and that was following a coughing fit. This is much better than previous nights per RN (was having 4-6 diarrhea stools overnight).  I think imodium is clearly helping and she should stay on 1 pill QID for now.  I explained it may take days, weeks for her bowels to return to baseline.   Will follow along. She needs to be encouraged to eat, ambulate with assist.   Owens Loffler, MD [facility] Pager [phone]  .

## 2021-07-24 ENCOUNTER — Inpatient Hospital Stay (HOSPITAL_COMMUNITY): Payer: Medicare Other

## 2021-07-24 DIAGNOSIS — K219 Gastro-esophageal reflux disease without esophagitis: Secondary | ICD-10-CM

## 2021-07-24 DIAGNOSIS — E119 Type 2 diabetes mellitus without complications: Secondary | ICD-10-CM

## 2021-07-24 DIAGNOSIS — N182 Chronic kidney disease, stage 2 (mild): Secondary | ICD-10-CM

## 2021-07-24 LAB — BASIC METABOLIC PANEL
Anion gap: 7 (ref 5–15)
BUN: 13 mg/dL (ref 8–23)
CO2: 27 mmol/L (ref 22–32)
Calcium: 7.8 mg/dL — ABNORMAL LOW (ref 8.9–10.3)
Chloride: 105 mmol/L (ref 98–111)
Creatinine, Ser: 0.43 mg/dL — ABNORMAL LOW (ref 0.44–1.00)
GFR, Estimated: >60 mL/min (ref >60)
Glucose, Bld: 140 mg/dL — ABNORMAL HIGH (ref 70–99)
Potassium: 3.7 mmol/L (ref 3.5–5.1)
Sodium: 139 mmol/L (ref 135–145)

## 2021-07-24 LAB — CBC WITH DIFFERENTIAL/PLATELET
Abs Immature Granulocytes: 0.15 10*3/uL — ABNORMAL HIGH (ref 0.00–0.07)
Basophils Absolute: 0 10*3/uL (ref 0.0–0.1)
Basophils Relative: 0 %
Eosinophils Absolute: 0.1 10*3/uL (ref 0.0–0.5)
Eosinophils Relative: 1 %
HCT: 31.2 % — ABNORMAL LOW (ref 36.0–46.0)
Hemoglobin: 10.2 g/dL — ABNORMAL LOW (ref 12.0–15.0)
Immature Granulocytes: 1 %
Lymphocytes Relative: 12 %
Lymphs Abs: 1.7 10*3/uL (ref 0.7–4.0)
MCH: 30.8 pg (ref 26.0–34.0)
MCHC: 32.7 g/dL (ref 30.0–36.0)
MCV: 94.3 fL (ref 80.0–100.0)
Monocytes Absolute: 1 10*3/uL (ref 0.1–1.0)
Monocytes Relative: 7 %
Neutro Abs: 11.3 10*3/uL — ABNORMAL HIGH (ref 1.7–7.7)
Neutrophils Relative %: 79 %
Platelets: 327 10*3/uL (ref 150–400)
RBC: 3.31 MIL/uL — ABNORMAL LOW (ref 3.87–5.11)
RDW: 12.5 % (ref 11.5–15.5)
WBC: 14.2 10*3/uL — ABNORMAL HIGH (ref 4.0–10.5)
nRBC: 0 % (ref 0.0–0.2)

## 2021-07-24 LAB — C-REACTIVE PROTEIN: CRP: 21.2 mg/dL — ABNORMAL HIGH (ref <1.0)

## 2021-07-24 LAB — MAGNESIUM: Magnesium: 1.8 mg/dL (ref 1.7–2.4)

## 2021-07-24 MED ORDER — IPRATROPIUM-ALBUTEROL 0.5-2.5 (3) MG/3ML IN SOLN
3.0000 mL | Freq: Three times a day (TID) | RESPIRATORY_TRACT | Status: DC
Start: 2021-07-25 — End: 2021-07-26
  Administered 2021-07-25 – 2021-07-26 (×4): 3 mL via RESPIRATORY_TRACT
  Filled 2021-07-24 (×4): qty 3

## 2021-07-24 MED ORDER — MORPHINE SULFATE (PF) 2 MG/ML IV SOLN
2.0000 mg | INTRAVENOUS | Status: DC | PRN
  Administered 2021-07-24 – 2021-07-25 (×2): 2 mg via INTRAVENOUS
  Filled 2021-07-24 (×2): qty 1

## 2021-07-24 MED ORDER — ALBUTEROL SULFATE (2.5 MG/3ML) 0.083% IN NEBU
2.5000 mg | INHALATION_SOLUTION | RESPIRATORY_TRACT | Status: DC | PRN
  Administered 2021-07-25: 2.5 mg via RESPIRATORY_TRACT
  Filled 2021-07-24: qty 3

## 2021-07-24 MED ORDER — IRBESARTAN 300 MG PO TABS
300.0000 mg | ORAL_TABLET | Freq: Every day | ORAL | Status: DC
  Administered 2021-07-25 – 2021-07-31 (×7): 300 mg via ORAL
  Filled 2021-07-24 (×7): qty 1

## 2021-07-24 NOTE — Progress Notes (Signed)
RN contacted Dr. Roderic Palau about patient having increased pain in left leg that is positional. Pt reports pain radiating from left upper quad to hamstring area. At times she has a tingly sensation in her left calf and then at times has a tingly sensation in her left foot/ankle. MD to assess patient.

## 2021-07-24 NOTE — Progress Notes (Signed)
Physical Therapy Treatment Patient Details Name: [name] MRN: [id number] DOB: 1940-09-24 Today's Date: 07/24/2021   History of Present Illness 80 yo female admitted with Cdiff, sepsis. Hx of DM, Cdiff, SVt, ACDF 2013, vertigo    PT Comments    Pt is experiencing acute L hip/pelvis/leg/foot/ankle pain which is a significant change compared to Monday's eval. Pt new requires Max A for transfers. She was unable to ambulate on today. RN aware and she stated she made MD aware. Will continue to follow and progress activity as tolerated. Will need SNF.     Recommendations for follow up therapy are one component of a multi-disciplinary discharge planning process, led by the attending physician.  Recommendations may be updated based on patient status, additional functional criteria and insurance authorization.  Follow Up Recommendations  Skilled nursing-short term rehab (<3 hours/day)     Assistance Recommended at Discharge Frequent or constant Supervision/Assistance  Equipment Recommendations  Rolling walker (2 wheels)    Recommendations for Other Services OT consult     Precautions / Restrictions Precautions Precautions: Fall Restrictions Weight Bearing Restrictions: No     Mobility  Bed Mobility Overal bed mobility: Needs Assistance Bed Mobility: Sidelying to Sit   Sidelying to sit: Max assist       General bed mobility comments: Assist for LEs and trunk. Increased time. Reliance on bedrail. Pt attempted to return to lying position x 2 due to pain    Transfers Overall transfer level: Needs assistance Equipment used: Rolling walker (2 wheels) Transfers: Sit to/from Stand;Bed to chair/wheelchair/BSC Sit to Stand: Max assist           General transfer comment: Attempted sit to stand with RW x 2-pt unable to stand. Sit to stand x 2 with pt holding onto therapist's arms-able to somewhat pivot over to bsc/recliner. WheezIng with activity.    Ambulation/Gait                General Gait Details: NT-unable due to L LE pain   Stairs             Wheelchair Mobility    Modified Rankin (Stroke Patients Only)       Balance Overall balance assessment: Needs assistance Sitting-balance support: Bilateral upper extremity supported;Feet supported Sitting balance-Leahy Scale: Fair     Standing balance support: Bilateral upper extremity supported Standing balance-Leahy Scale: Zero                              Cognition Arousal/Alertness: Awake/alert Behavior During Therapy: WFL for tasks assessed/performed Overall Cognitive Status: Within Functional Limits for tasks assessed Area of Impairment: Problem solving                             Problem Solving: Requires verbal cues General Comments: some mild problem solving issues (repeated cues, increased time) vs hoh?        Exercises      General Comments        Pertinent Vitals/Pain Pain Assessment: Faces Faces Pain Scale: Hurts even more Pain Location: L hip/low back/ankle/foot Pain Descriptors / Indicators: Radiating;Grimacing;Guarding Pain Intervention(s): Monitored during session;Repositioned;Limited activity within patient's tolerance    Home Living                          Prior Function  PT Goals (current goals can now be found in the care plan section) Progress towards PT goals: Not progressing toward goals - comment (decline in functional mobility and pt requiring increased assistance)    Frequency    Min 3X/week      PT Plan Current plan remains appropriate    Co-evaluation              AM-PAC PT "6 Clicks" Mobility   Outcome Measure  Help needed turning from your back to your side while in a flat bed without using bedrails?: A Lot Help needed moving from lying on your back to sitting on the side of a flat bed without using bedrails?: A Lot Help needed moving to and from a bed to a chair (including  a wheelchair)?: A Lot Help needed standing up from a chair using your arms (e.g., wheelchair or bedside chair)?: A Lot Help needed to walk in hospital room?: Total Help needed climbing 3-5 steps with a railing? : Total 6 Click Score: 10    End of Session Equipment Utilized During Treatment: Gait belt Activity Tolerance: Patient limited by pain Patient left: in chair;with call bell/phone within reach;with chair alarm set   PT Visit Diagnosis: Muscle weakness (generalized) (M62.81);Difficulty in walking, not elsewhere classified (R26.2);History of falling (Z91.81);Pain Pain - Right/Left: Left Pain - part of body: Leg;Ankle and joints of foot     Time: 8592-9244 PT Time Calculation (min) (ACUTE ONLY): 35 min  Charges:  $Therapeutic Activity: 23-37 mins              Doreatha Massed, PT Acute Rehabilitation  Office: [phone] Pager: [phone]

## 2021-07-24 NOTE — Progress Notes (Signed)
PROGRESS NOTE    [name]  QMV:[id number] DOB: 12-Nov-1940 DOA: 07/20/2021 PCP: [name], MD   Chief Complain: Diarrhea  Brief Narrative: Patient is a 80 year old female with history of depression/anxiety, CKD stage II, GERD, hyperlipidemia, hypertension, osteopenia who presented with cough, ongoing diarrhea.  She had recently completed a course of amoxicillin for respiratory infection after that she started having diarrhea.  C. difficile was positive and she was treated with oral vancomycin.  After completion of oral vancomycin, she was still having diarrhea and was prescribed fidaxomicin.  She presented to the hospital because she was not feeling well and she was having continuous diarrhea.  CT abdomen/pelvis did not show any colitis but showed diverticulosis.  C. difficile testing has been negative here.   PT/OT recommending CIR on discharge.  Assessment & Plan:   Principal Problem:   C. difficile diarrhea Active Problems:   Hyperlipidemia associated with type 2 diabetes mellitus ([place])   Hypertension associated with diabetes ([place])   GERD (gastroesophageal reflux disease)   Diabetes mellitus without complication (HCC)   Sepsis ([place])   CKD (chronic kidney disease) stage 2, GFR 60-89 ml/min   Hypokalemia   Diarrhea post C. difficile: Found to have C. difficile recently after finishing a course of amoxicillin.  Treated with vancomycin.After completion of oral vancomycin, she was still having diarrhea and was prescribed fidaxomicin.  She presented to the hospital because she was not feeling well and she was having continuous diarrhea.  CT abdomen/pelvis did not show any colitis but showed diverticulosis.  C. difficile testing has been negative here.  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  On scheduled dose of potassium.  Suspected sepsis: Presented with tachycardia, tachypnea, leukocytosis.  Sepsis physiology has improved.  Continues to have leukocytosis,likely reactive.  CKD stage II: Currently kidney function at baseline.  Diabetes type 2: Recent hemoglobin A1c of 6.1.  Continue monitoring blood sugars.  Hypertension: Currently on Cardizem.  Blood pressures are trending up.  Will resume home dose of ARB.  Hyperlipidemia: On Lipitor at home.  Anxiety: On Xanax, Lexapro  Stridor/upper respiratory wheezing: Noticed this morning.  Her lungs are clear.  Continue bronchodilators.  If she continues to have worsening stridor/upper respiratory wheezing, will consider steroid treatment.  Debility/deconditioning/weakness: Lives alone.  Usually  ambulatory, self dependent, drives car.  Currently deconditioned.  PT/OT recommended acute rehab on discharge.  Left foot pain -Appears to be limiting patient's ability to ambulate and work with physical therapy -Reports being unable to bear weight on this foot -Does not have any tenderness in her left hip or left knee with range of motion -Check x-ray of left ankle since this is tender with range of motion -Check venous Doppler since she does have radiation of pain to her calf -If work-up is unrevealing, can consider ankle brace -Continue pain management         DVT prophylaxis: Lovenox Code Status: Full code Family Communication: None present at the bedside Patient status: Inpatient  Dispo: The patient is from: Home              Anticipated d/c is to: CIR              Anticipated d/c date is:  In the 1-2 days, need to wait for resolution of diarrhea  Consultants: None  Procedures: None Antimicrobials:  Anti-infectives (From admission, onward)    Start     Dose/Rate Route Frequency Ordered Stop   07/21/21 1000  doxycycline (VIBRA-TABS)  tablet 100 mg  Status:  Discontinued        100 mg Oral Every 12 hours 07/21/21 0410 07/23/21 0815   07/20/21 2200  fidaxomicin (DIFICID) tablet 200 mg        200 mg Oral 2 times daily 07/20/21 1818 07/30/21 2159       Subjective:  Reports having 3 bowel movements so far today.  She does complain of pain in her left foot.  She is unable to bear weight on her foot.  Reports that pain does come into her left calf.  This been present for the past few days.  Objective: Vitals:   07/23/21 2038 07/24/21 0556 07/24/21 1405 07/24/21 2000  BP: (!) 160/74 (!) 147/95 (!) 162/90 (!) 149/55  Pulse: 100 87 88 99  Resp: 20 20 18 17   Temp: (!) 100.6 F (38.1 C) 98.1 F (36.7 C) 98.6 F (37 C) 98 F (36.7 C)  TempSrc: Oral Oral Oral Axillary  SpO2: 95% 94% 95% 92%  Weight:      Height:        Intake/Output Summary (Last 24 hours) at 07/24/2021 2022 Last data filed at 07/24/2021 0600 Gross per 24 hour  Intake 3264.59 ml  Output 725 ml  Net 2539.59 ml   Filed Weights   07/21/21 0300  Weight: 53.1 kg    Examination:  General exam: Weak, deconditioned HEENT: PERRL Respiratory system:  no wheezes or crackles  Cardiovascular system: S1 & S2 heard, RRR.  Gastrointestinal system: Abdomen is nondistended, soft and nontender. Central nervous system: Alert and oriented Extremities: Patient has increased pain with rotation of the left ankle.  Has good range of motion in left knee and left hip without tenderness. Skin: No rashes, no ulcers,no icterus      Data Reviewed: I have personally reviewed following labs and imaging studies  CBC: Recent Labs  Lab 07/20/21 1155 07/21/21 0433 07/22/21 0354 07/23/21 0403 07/24/21 0400  WBC 12.0* 14.1* 17.6* 15.8* 14.2*  NEUTROABS  --   --  14.6* 13.2* 11.3*  HGB 12.3 11.6* 10.6* 10.9* 10.2*  HCT 37.9 35.2* 32.2* 33.4* 31.2*  MCV 94.5 92.6 92.8 92.5 94.3  PLT 347 265 295 303 858   Basic Metabolic Panel: Recent Labs  Lab 07/20/21 1155  07/20/21 2302 07/21/21 0433 07/22/21 0354 07/23/21 0403 07/24/21 0400  NA 133* 137 137 136 136 139  K 2.4* 2.7* 3.1* 3.0* 3.4* 3.7  CL 91* 99 99 101 104 105  CO2 30 29 28 25 24 27   GLUCOSE 209* 132* 133* 143* 141* 140*  BUN 9 6* 7* 13 14 13   CREATININE 0.69 0.53 0.52 0.42* 0.45 0.43*  CALCIUM 8.6* 7.2* 7.7* 7.5* 7.5* 7.8*  MG 1.6*  --   --  1.9 2.1 1.8  PHOS  --  1.6*  --   --   --   --    GFR: Estimated Creatinine Clearance: 40.9 mL/min (A) (by C-G formula based on SCr of 0.43 mg/dL (L)). Liver Function Tests: Recent Labs  Lab 07/20/21 1155 07/20/21 2302 07/21/21 0433  AST 20  --  17  ALT 14  --  12  ALKPHOS 54  --  53  BILITOT 0.8  --  1.4*  PROT 6.9  --  6.5  ALBUMIN 3.4* 2.8* 3.0*   No results for input(s): LIPASE, AMYLASE in the last 168 hours. No results for input(s): AMMONIA in the last 168 hours. Coagulation Profile: Recent Labs  Lab 07/21/21 0433  INR 1.2   Cardiac Enzymes: No results for input(s): CKTOTAL, CKMB, CKMBINDEX, TROPONINI in the last 168 hours. BNP (last 3 results) No results for input(s): PROBNP in the last 8760 hours. HbA1C: No results for input(s): HGBA1C in the last 72 hours.  CBG: No results for input(s): GLUCAP in the last 168 hours. Lipid Profile: No results for input(s): CHOL, HDL, LDLCALC, TRIG, CHOLHDL, LDLDIRECT in the last 72 hours. Thyroid Function Tests: No results for input(s): TSH, T4TOTAL, FREET4, T3FREE, THYROIDAB in the last 72 hours. Anemia Panel: No results for input(s): VITAMINB12, FOLATE, FERRITIN, TIBC, IRON, RETICCTPCT in the last 72 hours. Sepsis Labs: Recent Labs  Lab 07/21/21 0433 07/21/21 0706  PROCALCITON 0.18  --   LATICACIDVEN 1.5 1.2    Recent Results (from the past 240 hour(s))  Resp Panel by RT-PCR (Flu A&B, Covid) Nasopharyngeal Swab     Status: None   Collection Time: 07/20/21 11:40 AM   Specimen: Nasopharyngeal Swab; Nasopharyngeal(NP) swabs in vial transport medium  Result Value Ref Range  Status   SARS Coronavirus 2 by RT PCR NEGATIVE NEGATIVE Final    Comment: (NOTE) SARS-CoV-2 target nucleic acids are NOT DETECTED.  The SARS-CoV-2 RNA is generally detectable in upper respiratory specimens during the acute phase of infection. The lowest concentration of SARS-CoV-2 viral copies this assay can detect is 138 copies/mL. A negative result does not preclude SARS-Cov-2 infection and should not be used as the sole basis for treatment or other patient management decisions. A negative result may occur with  improper specimen collection/handling, submission of specimen other than nasopharyngeal swab, presence of viral mutation(s) within the areas targeted by this assay, and inadequate number of viral copies(<138 copies/mL). A negative result must be combined with clinical observations, patient history, and epidemiological information. The expected result is Negative.  Fact Sheet for Patients:  EntrepreneurPulse.com.au  Fact Sheet for Healthcare Providers:  IncredibleEmployment.be  This test is no t yet approved or cleared by the [place] FDA and  has been authorized for detection and/or diagnosis of SARS-CoV-2 by FDA under an Emergency Use Authorization (EUA). This EUA will remain  in effect (meaning this test can be used) for the duration of the COVID-19 declaration under Section 564(b)(1) of the Act, 21 U.S.C.section 360bbb-3(b)(1), unless the authorization is terminated  or revoked sooner.       Influenza A by PCR NEGATIVE NEGATIVE Final   Influenza B by PCR NEGATIVE NEGATIVE Final    Comment: (NOTE) The Xpert Xpress SARS-CoV-2/FLU/RSV plus assay is intended as an aid in the diagnosis of influenza from Nasopharyngeal swab specimens and should not be used as a sole basis for treatment. Nasal washings and aspirates are unacceptable for Xpert Xpress SARS-CoV-2/FLU/RSV testing.  Fact Sheet for  Patients: EntrepreneurPulse.com.au  Fact Sheet for Healthcare Providers: IncredibleEmployment.be  This test is not yet approved or cleared by the [place] FDA and has been authorized for detection and/or diagnosis of SARS-CoV-2 by FDA under an Emergency Use Authorization (EUA). This EUA will remain in effect (meaning this test can be used) for the duration of the COVID-19 declaration under Section 564(b)(1) of the Act, 21 U.S.C. section 360bbb-3(b)(1), unless the authorization is terminated or revoked.  Performed at [facility], [place]  [address], [place], [place] [address]   C Difficile Quick Screen w PCR reflex     Status: None   Collection Time: 07/21/21  4:48 PM   Specimen: STOOL  Result Value Ref Range Status   C Diff antigen NEGATIVE NEGATIVE Final   C Diff toxin NEGATIVE NEGATIVE Final   C Diff interpretation No C. difficile detected.  Final    Comment: Performed at [facility], [place] [address] [place], [place] [address]         Radiology Studies: DG CHEST PORT 1 VIEW  Result Date: 07/23/2021 CLINICAL DATA:  Shortness of breath. EXAM: PORTABLE CHEST 1 VIEW COMPARISON:  Chest x-ray dated July 20, 2021. FINDINGS: The heart size and mediastinal contours are within normal limits. Both lungs are clear. The visualized skeletal structures are unremarkable. IMPRESSION: No active disease. Electronically Signed   By: Titus Dubin M.D.   On: 07/23/2021 13:38   DG Ankle Left Port  Result Date: 07/24/2021 CLINICAL DATA:  Left ankle pain. EXAM: PORTABLE LEFT ANKLE - 2 VIEW COMPARISON:  None. FINDINGS: Evaluation of the forefoot is limited due to positioning. No acute fracture identified. No dislocation. There is degenerative changes of the ankle. Fixation K-wires noted in the first metatarsal as well as fixation screw in the second toe. The soft tissues are grossly unremarkable IMPRESSION: No acute  fracture or dislocation. Electronically Signed   By: Anner Crete M.D.   On: 07/24/2021 19:49        Scheduled Meds:  diltiazem  240 mg Oral Daily   dorzolamide-timolol  1 drop Both Eyes BID   enoxaparin (LOVENOX) injection  40 mg Subcutaneous Q24H   escitalopram  20 mg Oral Daily   feeding supplement  237 mL Oral TID BM   fidaxomicin  200 mg Oral BID   fluticasone  2 spray Each Nare Daily   gabapentin  100 mg Oral TID   guaiFENesin  600 mg Oral BID   ipratropium-albuterol  3 mL Nebulization BID   loperamide  2 mg Oral QID   nystatin  5 mL Oral QID   potassium chloride  40 mEq Oral BID   Continuous Infusions:  sodium chloride 75 mL/hr at 07/24/21 1723     LOS: 4 days    Time spent:35 mins. More than 50% of that time was spent in counseling and/or coordination of care.      Kathie Dike, MD [facility] P12/14/2022, 8:22 PM

## 2021-07-24 NOTE — Plan of Care (Signed)
°  Problem: Activity: Goal: Risk for activity intolerance will decrease Outcome: Not Progressing   Problem: Nutrition: Goal: Adequate nutrition will be maintained Outcome: Progressing   Problem: Pain Managment: Goal: General experience of comfort will improve Outcome: Progressing   Problem: Safety: Goal: Ability to remain free from injury will improve Outcome: Progressing

## 2021-07-25 ENCOUNTER — Inpatient Hospital Stay (HOSPITAL_COMMUNITY): Payer: Medicare Other

## 2021-07-25 DIAGNOSIS — E785 Hyperlipidemia, unspecified: Secondary | ICD-10-CM

## 2021-07-25 DIAGNOSIS — M79605 Pain in left leg: Secondary | ICD-10-CM

## 2021-07-25 DIAGNOSIS — E1169 Type 2 diabetes mellitus with other specified complication: Secondary | ICD-10-CM

## 2021-07-25 LAB — BASIC METABOLIC PANEL
Anion gap: 8 (ref 5–15)
BUN: 13 mg/dL (ref 8–23)
CO2: 27 mmol/L (ref 22–32)
Calcium: 8 mg/dL — ABNORMAL LOW (ref 8.9–10.3)
Chloride: 104 mmol/L (ref 98–111)
Creatinine, Ser: 0.44 mg/dL (ref 0.44–1.00)
GFR, Estimated: >60 mL/min (ref >60)
Glucose, Bld: 147 mg/dL — ABNORMAL HIGH (ref 70–99)
Potassium: 4.4 mmol/L (ref 3.5–5.1)
Sodium: 139 mmol/L (ref 135–145)

## 2021-07-25 LAB — CBC WITH DIFFERENTIAL/PLATELET
Abs Immature Granulocytes: 0.13 10*3/uL — ABNORMAL HIGH (ref 0.00–0.07)
Basophils Absolute: 0 10*3/uL (ref 0.0–0.1)
Basophils Relative: 0 %
Eosinophils Absolute: 0.2 10*3/uL (ref 0.0–0.5)
Eosinophils Relative: 1 %
HCT: 30.9 % — ABNORMAL LOW (ref 36.0–46.0)
Hemoglobin: 9.7 g/dL — ABNORMAL LOW (ref 12.0–15.0)
Immature Granulocytes: 1 %
Lymphocytes Relative: 16 %
Lymphs Abs: 2.2 10*3/uL (ref 0.7–4.0)
MCH: 30.7 pg (ref 26.0–34.0)
MCHC: 31.4 g/dL (ref 30.0–36.0)
MCV: 97.8 fL (ref 80.0–100.0)
Monocytes Absolute: 1 10*3/uL (ref 0.1–1.0)
Monocytes Relative: 7 %
Neutro Abs: 10.6 10*3/uL — ABNORMAL HIGH (ref 1.7–7.7)
Neutrophils Relative %: 75 %
Platelets: 317 10*3/uL (ref 150–400)
RBC: 3.16 MIL/uL — ABNORMAL LOW (ref 3.87–5.11)
RDW: 12.9 % (ref 11.5–15.5)
WBC: 14.2 10*3/uL — ABNORMAL HIGH (ref 4.0–10.5)
nRBC: 0 % (ref 0.0–0.2)

## 2021-07-25 LAB — MAGNESIUM: Magnesium: 1.9 mg/dL (ref 1.7–2.4)

## 2021-07-25 MED ORDER — FUROSEMIDE 10 MG/ML IJ SOLN
20.0000 mg | Freq: Once | INTRAMUSCULAR | Status: AC
  Administered 2021-07-25: 20 mg via INTRAVENOUS
  Filled 2021-07-25: qty 2

## 2021-07-25 MED ORDER — LOPERAMIDE HCL 2 MG PO CAPS
2.0000 mg | ORAL_CAPSULE | Freq: Every day | ORAL | Status: DC
  Administered 2021-07-26: 2 mg via ORAL
  Filled 2021-07-25: qty 1

## 2021-07-25 NOTE — NC FL2 (Signed)
[place] LEVEL OF CARE SCREENING TOOL     IDENTIFICATION  Patient Name: [name] Birthdate: September 17, 1940 Sex: female Admission Date (Current Location): 07/20/2021  [facility] and [place] Number:  Herbalist and Address:  [facility],  [place], [place]      Provider Number: [id number]  Attending Physician Name and Address:  Tawni Millers,*  Relative Name and Phone Number:  Sela Hilding Daughter [phone]    Current Level of Care: Hospital Recommended Level of Care: [place] Prior Approval Number:    Date Approved/Denied:   PASRR Number:    Discharge Plan: SNF    Current Diagnoses: Patient Active Problem List   Diagnosis Date Noted   CKD (chronic kidney disease) stage 2, GFR 60-89 ml/min 07/21/2021   Hypokalemia 07/21/2021   C. difficile diarrhea 07/20/2021   Sepsis ([place]) 07/20/2021   Vertigo 04/06/2020   Sensorineural hearing loss (SNHL), bilateral 04/06/2020   Aortic atherosclerosis ([place]) 07/21/2019   Diabetes mellitus without complication ([place]) 63/84/6659   GERD (gastroesophageal reflux disease) 07/14/2014   Primary open angle glaucoma of both eyes, mild stage 05/29/2014   Knee pain, left 11/02/2013   IBS (irritable bowel syndrome)- diarrhea predominant 01/26/2013   History of colonic polyps 11/28/2011   History of supraventricular tachycardia 08/14/2011   Senile nuclear sclerosis 06/29/2011   Glaucoma associated with ocular disorder 11/05/2009   Cervical disc disorder with radiculopathy of cervical region 11/05/2009   Hyperlipidemia associated with type 2 diabetes mellitus ([place]) 09/22/2007   Major depression in full remission ([place])- Lexapro 20mg . Xanax once a week or less. 03/11/2007   Hypertension associated with diabetes ([place]) 03/11/2007   Rosacea 03/11/2007    Orientation RESPIRATION BLADDER Height & Weight     Self, Time, Situation, Place  Normal Incontinent Weight:  53.1 kg Height:  5\' 1"  (154.9 cm)  BEHAVIORAL SYMPTOMS/MOOD NEUROLOGICAL BOWEL NUTRITION STATUS      Incontinent Diet (Carb Modified)  AMBULATORY STATUS COMMUNICATION OF NEEDS Skin   Extensive Assist Verbally Skin abrasions (Abrasion Bilateral Arms)                       Personal Care Assistance Level of Assistance  Bathing, Feeding, Dressing Bathing Assistance: Maximum assistance Feeding assistance: Limited assistance Dressing Assistance: Maximum assistance     Functional Limitations Info  Sight, Hearing, Speech Sight Info: Adequate Hearing Info: Adequate Speech Info: Adequate    SPECIAL CARE FACTORS FREQUENCY  PT (By licensed PT), OT (By licensed OT)     PT Frequency: Eval and Treat OT Frequency: Eval and Treat            Contractures Contractures Info: Not present    Additional Factors Info  Code Status, Allergies, Psychotropic Code Status Info: FULL Allergies Info: Codeine Psychotropic Info: Xanax, Lexapro         Current Medications (07/25/2021):  This is the current hospital active medication list Current Facility-Administered Medications  Medication Dose Route Frequency Provider Last Rate Last Admin   0.9 %  sodium chloride infusion   Intravenous Continuous Dwyane Dee, MD 75 mL/hr at 07/24/21 1723 New Bag at 07/24/21 1723   acetaminophen (TYLENOL) tablet 650 mg  650 mg Oral Q6H PRN Cherylann Ratel A, DO   650 mg at 07/25/21 9357   Or   acetaminophen (TYLENOL) suppository 650 mg  650 mg Rectal Q6H PRN Marylyn Ishihara, Tyrone A, DO       albuterol (PROVENTIL) (2.5 MG/3ML)  0.083% nebulizer solution 2.5 mg  2.5 mg Nebulization Q4H PRN Kathie Dike, MD   2.5 mg at 07/25/21 0349   ALPRAZolam Duanne Moron) tablet 0.25 mg  0.25 mg Oral Daily PRN Cherylann Ratel A, DO   0.25 mg at 07/25/21 0842   diltiazem (CARDIZEM CD) 24 hr capsule 240 mg  240 mg Oral Daily Kyle, Tyrone A, DO   240 mg at 07/25/21 0844   dorzolamide-timolol (COSOPT) 22.3-6.8 MG/ML ophthalmic solution 1 drop   1 drop Both Eyes BID Marylyn Ishihara, Tyrone A, DO   1 drop at 07/25/21 0844   enoxaparin (LOVENOX) injection 40 mg  40 mg Subcutaneous Q24H Kyle, Tyrone A, DO   40 mg at 07/25/21 0841   escitalopram (LEXAPRO) tablet 20 mg  20 mg Oral Daily Kyle, Tyrone A, DO   20 mg at 07/25/21 0853   feeding supplement (ENSURE ENLIVE / ENSURE PLUS) liquid 237 mL  237 mL Oral TID BM Esterwood, Amy S, PA-C   237 mL at 07/24/21 2022   fidaxomicin (DIFICID) tablet 200 mg  200 mg Oral BID Marylyn Ishihara, Tyrone A, DO   200 mg at 07/25/21 0910   fluticasone (FLONASE) 50 MCG/ACT nasal spray 2 spray  2 spray Each Nare Daily Marylyn Ishihara, Tyrone A, DO   2 spray at 07/25/21 0845   gabapentin (NEURONTIN) capsule 100 mg  100 mg Oral TID Shelly Coss, MD   100 mg at 07/25/21 0842   guaiFENesin (MUCINEX) 12 hr tablet 600 mg  600 mg Oral BID Marylyn Ishihara, Tyrone A, DO   600 mg at 07/25/21 0843   ipratropium-albuterol (DUONEB) 0.5-2.5 (3) MG/3ML nebulizer solution 3 mL  3 mL Nebulization TID Kathie Dike, MD   3 mL at 07/25/21 0855   irbesartan (AVAPRO) tablet 300 mg  300 mg Oral Daily Kathie Dike, MD   300 mg at 07/25/21 0843   metoprolol tartrate (LOPRESSOR) injection 5 mg  5 mg Intravenous Q6H PRN Marylyn Ishihara, Tyrone A, DO       morphine 2 MG/ML injection 2 mg  2 mg Intravenous Q4H PRN Kathie Dike, MD   2 mg at 07/25/21 0901   nystatin (MYCOSTATIN) 100000 UNIT/ML suspension 500,000 Units  5 mL Oral QID Esterwood, Amy S, PA-C   500,000 Units at 07/25/21 0845   ondansetron (ZOFRAN) tablet 4 mg  4 mg Oral Q6H PRN Cherylann Ratel A, DO   4 mg at 07/25/21 8841   Or   ondansetron (ZOFRAN) injection 4 mg  4 mg Intravenous Q6H PRN Marylyn Ishihara, Tyrone A, DO       potassium chloride SA (KLOR-CON M) CR tablet 40 mEq  40 mEq Oral BID Marylyn Ishihara, Tyrone A, DO   40 mEq at 07/25/21 0845   traMADol (ULTRAM) tablet 50 mg  50 mg Oral Q6H PRN Marylyn Ishihara, Tyrone A, DO   50 mg at 07/25/21 6606     Discharge Medications: Please see discharge summary for a list of discharge  medications.  Relevant Imaging Results:  Relevant Lab Results:   Additional Information [phone]  Purcell Mouton, RN

## 2021-07-25 NOTE — Progress Notes (Signed)
Checked pt throughout the day, medicated earlier for pain, wheezing improved, upper airway, near throat area, resp called for another breathing treatment. Family remain at bedside MD rounding, and made aware. Leg pains better at present time. Will continue to monitor. Pt awake and eating bites of lunch. SRP, RN

## 2021-07-25 NOTE — Progress Notes (Addendum)
Progress Note Hospital Day: 6  Chief Complaint:    diarrhea     ASSESSMENT AND PLAN   Brief history :  80 yo female with PMH of diverticulosis, adenomatous colon polyps, GERD with previous esophageal stricture, hiatal hernia, DM2, CKD stage II, glaucoma, anxiety and hypertension. Admitted with diarrhea, recent C-diff infection. No improvement with Vancomycin. Subsequently started on Dificid as outpatient but too weak to complete treatment to came to ED and was admitted for diarrhea with suspected sepsis / hypokalemia / AKI.      # Acute on chronic ( mild) diarrhea in setting of recent C-diff ( mid November). Persistent diarrhea after course of Vancomycin. Negative C-diff antigen and toxin in hospital on 07/21/21. Diarrhea resolving  --Colonoscopy May 2022 ( diverticulosis, small TA, random colon biopsies negative for microscopic colitis) --No colon wall thickening on CTAP --Afebrile. WBC still elevated but better overall 17.6 >> 14.2 --Continue BID Dificid , on day # 6. Needs total of 10 days --Will decrease imodium. No BMs today and reportedly only one BM yesterday.   # Expiratory wheezing. No increased work of breathing. )2 sats in upper 90's. Getting 02 per [place]. CXR today >>>Multilobar bilateral bronchopneumonia (right greater than left) with small right-sided parapneumonic pleural effusion.   # [place] anemia,  new. Baseline hgb 12.3 >>10.6 >> 9.7. Possibly dilutional related to IV fluids.                                                   # ? Oral candidiasis secondary to antibiotics.  --Continue Nystatin S+S  # Debility / weakness. PT following.    SUBJECTIVE   No BMs today, maybe one yesterday. Concerned that she cannot walk, especially on left leg. LLE ultrasound results pending. Wheezing started today, doesn't normal wheeze at home       OBJECTIVE      Scheduled inpatient medications:   diltiazem  240 mg Oral Daily   dorzolamide-timolol  1 drop Both Eyes BID    enoxaparin (LOVENOX) injection  40 mg Subcutaneous Q24H   escitalopram  20 mg Oral Daily   feeding supplement  237 mL Oral TID BM   fidaxomicin  200 mg Oral BID   fluticasone  2 spray Each Nare Daily   gabapentin  100 mg Oral TID   guaiFENesin  600 mg Oral BID   ipratropium-albuterol  3 mL Nebulization TID   irbesartan  300 mg Oral Daily   loperamide  2 mg Oral QID   nystatin  5 mL Oral QID   potassium chloride  40 mEq Oral BID   Continuous inpatient infusions:   sodium chloride 75 mL/hr at 07/24/21 1723   PRN inpatient medications: acetaminophen **OR** acetaminophen, albuterol, ALPRAZolam, metoprolol tartrate, morphine injection, ondansetron **OR** ondansetron (ZOFRAN) IV, traMADol  Vital signs in last 24 hours: Temp:  [98 F (36.7 C)-98.6 F (37 C)] 98.1 F (36.7 C) (12/15 0433) Pulse Rate:  [88-99] 95 (12/15 0433) Resp:  [17-19] 19 (12/15 0433) BP: (149-163)/(55-90) 163/84 (12/15 0433) SpO2:  [92 %-96 %] 96 % (12/15 0433) Last BM Date: 07/24/21 No intake or output data in the 24 hours ending 07/25/21 0956   Physical Exam:  General: Alert female in NAD Heart:  Regular rate and rhythm, 1+ right pedal edema Pulmonary: Normal respiratory effort. 02 per [place]. Diffuse  expiratory wheezing Abdomen: Soft, nondistended, nontender. Normal bowel sounds.  Neurologic: Alert and oriented Psych: Pleasant. Cooperative.   Filed Weights   07/21/21 0300  Weight: 53.1 kg    Intake/Output from previous day: No intake/output data recorded. Intake/Output this shift: No intake/output data recorded.    Lab Results: Recent Labs    07/23/21 0403 07/24/21 0400 07/25/21 0354  WBC 15.8* 14.2* 14.2*  HGB 10.9* 10.2* 9.7*  HCT 33.4* 31.2* 30.9*  PLT 303 327 317   BMET Recent Labs    07/23/21 0403 07/24/21 0400 07/25/21 0354  NA 136 139 139  K 3.4* 3.7 4.4  CL 104 105 104  CO2 24 27 27   GLUCOSE 141* 140* 147*  BUN 14 13 13   CREATININE 0.45 0.43* 0.44  CALCIUM 7.5* 7.8* 8.0*    LFT No results for input(s): PROT, ALBUMIN, AST, ALT, ALKPHOS, BILITOT, BILIDIR, IBILI in the last 72 hours. PT/INR No results for input(s): LABPROT, INR in the last 72 hours. Hepatitis Panel No results for input(s): HEPBSAG, HCVAB, HEPAIGM, HEPBIGM in the last 72 hours.  DG CHEST PORT 1 VIEW  Result Date: 07/25/2021 CLINICAL DATA:  80 year old female with history of increasing shortness of breath and difficulty breathing this evening. EXAM: PORTABLE CHEST 1 VIEW COMPARISON:  Chest x-ray 07/23/2021. FINDINGS: Areas of interstitial prominence and patchy multifocal airspace disease are noted throughout the lungs bilaterally, most confluent in the right mid to lower lung and medial aspect of the left lower lobe. Small right pleural effusion. No definite left pleural effusion. No evidence of pulmonary edema. No pneumothorax. Heart size is normal. Upper mediastinal contours are within normal limits. Atherosclerotic calcifications in the thoracic aorta. Orthopedic fixation hardware in the lower cervical spine incidentally noted. IMPRESSION: 1. Multilobar bilateral bronchopneumonia (right greater than left) with small right-sided parapneumonic pleural effusion. 2. Aortic atherosclerosis. Electronically Signed   By: Vinnie Langton M.D.   On: 07/25/2021 05:53   DG CHEST PORT 1 VIEW  Result Date: 07/23/2021 CLINICAL DATA:  Shortness of breath. EXAM: PORTABLE CHEST 1 VIEW COMPARISON:  Chest x-ray dated July 20, 2021. FINDINGS: The heart size and mediastinal contours are within normal limits. Both lungs are clear. The visualized skeletal structures are unremarkable. IMPRESSION: No active disease. Electronically Signed   By: Titus Dubin M.D.   On: 07/23/2021 13:38   DG Ankle Left Port  Result Date: 07/24/2021 CLINICAL DATA:  Left ankle pain. EXAM: PORTABLE LEFT ANKLE - 2 VIEW COMPARISON:  None. FINDINGS: Evaluation of the forefoot is limited due to positioning. No acute fracture identified. No  dislocation. There is degenerative changes of the ankle. Fixation K-wires noted in the first metatarsal as well as fixation screw in the second toe. The soft tissues are grossly unremarkable IMPRESSION: No acute fracture or dislocation. Electronically Signed   By: Anner Crete M.D.   On: 07/24/2021 19:49       LOS: 5 days   Tye Savoy ,NP 07/25/2021, 9:56 AM   ________________________________________________________________________  Velora Heckler GI MD note:  I personally examined the patient, reviewed the data and agree with the assessment and plan described above.  Bowels are much improved. This is probably due to both continued dificid and QID imodium. No BM in almost 2 days.  No abd pains. She has terrible leg pains and newly diagnosed multilobar bilateral pneumonia. I am going to decrease her imodium to one pill daily (down for QID). Should continue her dificid for full 10 day course. Will follow along.   Quillian Quince  Ardis Hughs, MD [facility] Pager [phone]

## 2021-07-25 NOTE — Progress Notes (Signed)
PROGRESS NOTE    [name]  PFX:[id number] DOB: 1940-11-01 DOA: 07/20/2021 PCP: [name], MD    Brief Narrative:  [name] was admitted to the hospital with the working diagnosis of diarrhea post C diff colitis.   80 yo female with the past medical history of GERD, hypertension, dyslipidemia, who presented with cough and diarrhea. She was diagnosed with C diff as outpatient after taking antibiotic therapy for 10 days (amoxicillin) for an upper respiratory tract infection. She took oral vancomycin for 10 days, but continue to have diarrhea. She developed general weakness that prompted her to come to the hospital. On her initial physical examination blood pressure 159/76, HR 106, rr 27 and oxygen saturation 94%. Lungs  with decreased breath sounds, heart with S1 and S2 present, abdomen soft and non tender, no lower extremity edema.   Sodium 137 potassium 2.7 chloride 99, bicarb 29, glucose 132, BUN 6, creatinine 0.53, white count 14.1, globin 11.6, hematocrit 35.2, platelets 265 SARS COVID-19 negative.  Urinalysis specific gravity 1.011 Stool negative for C. Difficile  CT abdomen with diffuse colonic diverticulosis without diverticulitis no inflammatory changes.  Bilateral nonobstructing renal calculi, largest in the right kidney measuring 10 mm  131 bpm, left axis deviation, normal intervals, sinus rhythm with PVC, no significant st segment or t wave changes   Patient has been placed on fidoxamicin   12/15 chest film with increase interstitial infiltrates more on the right lower lobe.   Assessment & Plan:   Principal Problem:   C. difficile diarrhea Active Problems:   Hyperlipidemia associated with type 2 diabetes mellitus ([place])   Hypertension associated with diabetes ([place])   GERD (gastroesophageal reflux disease)   Diabetes mellitus without complication (H[facility])   Sepsis ([place])   CKD (chronic kidney disease) stage 2, GFR 60-89 ml/min   Hypokalemia   C diff  diarrhea. Diarrhea has improved after imodoium, no nausea or vomiting. Plan to continue to fidaxomicin.  Tolerating po well, no abdominal pain.   2. Hypokalemia due to GI losses/ CKD stage 2. Renal function with serum cr at 0,44. K is 4,4 and serum bicarbonate at 27. Continue close follow up on renal function and electrolytes  Hold on K supplementation   3. Non cardiogenic pulmonary edema with acute hypoxemic respiratory failure.  Follow up chest film from 12./15 personally reviewed with bilateral interstitial infiltrates more predominant on the right base. Will stop IV fluids and add furosemide 20 mg IV x1 Continue oxymetry monitoring and supplemental 02 per [place]  4. T2DM/ dyslipidemia fasting glucose is 147,. Continue sliding scale for glucose cover and monitoring   5. HTN continue blood pressure monitoring  Continue with diltiazem, and irbesartan.    Patient continue to be at high risk for worsening respiratory failure   Status is: Inpatient  Remains inpatient appropriate because: IV furosemide        DVT prophylaxis:  Enoxaparin   Code Status:    full  Family Communication:  I spoke with patient's daughter at the bedside, we talked in detail about patient's condition, plan of care and prognosis and all questions were addressed.     Consultants:  GI   P  Subjective: Patient continue to be very weak and deconditioned, positive dyspnea and leg cramps  Objective: Vitals:   07/25/21 0349 07/25/21 0433 07/25/21 1333 07/25/21 1524  BP:  (!) 163/84  136/87  Pulse:  95  (!) 43  Resp:  19  20  Temp:  98.1 F (36.7  C)    TempSrc:  Axillary    SpO2: 94% 96% 97% 97%  Weight:      Height:       No intake or output data in the 24 hours ending 07/25/21 1636 Filed Weights   07/21/21 0300  Weight: 53.1 kg    Examination:   General: deconditioned  Neurology: Awake and alert, non focal  E ENT: no pallor, no icterus, oral mucosa moist Cardiovascular: No JVD. S1-S2  present, rhythmic, no gallops, rubs, or murmurs. No lower extremity edema. Pulmonary: positive breath sounds bilaterally, adequate air movement, no wheezing, rhonchi or rales. Gastrointestinal. Abdomen soft and non tender Skin. No rashes Musculoskeletal: no joint deformities     Data Reviewed: I have personally reviewed following labs and imaging studies  CBC: Recent Labs  Lab 07/21/21 0433 07/22/21 0354 07/23/21 0403 07/24/21 0400 07/25/21 0354  WBC 14.1* 17.6* 15.8* 14.2* 14.2*  NEUTROABS  --  14.6* 13.2* 11.3* 10.6*  HGB 11.6* 10.6* 10.9* 10.2* 9.7*  HCT 35.2* 32.2* 33.4* 31.2* 30.9*  MCV 92.6 92.8 92.5 94.3 97.8  PLT 265 295 303 327 035   Basic Metabolic Panel: Recent Labs  Lab 07/20/21 1155 07/20/21 2302 07/21/21 0433 07/22/21 0354 07/23/21 0403 07/24/21 0400 07/25/21 0354  NA 133* 137 137 136 136 139 139  K 2.4* 2.7* 3.1* 3.0* 3.4* 3.7 4.4  CL 91* 99 99 101 104 105 104  CO2 30 29 28 25 24 27 27   GLUCOSE 209* 132* 133* 143* 141* 140* 147*  BUN 9 6* 7* 13 14 13 13   CREATININE 0.69 0.53 0.52 0.42* 0.45 0.43* 0.44  CALCIUM 8.6* 7.2* 7.7* 7.5* 7.5* 7.8* 8.0*  MG 1.6*  --   --  1.9 2.1 1.8 1.9  PHOS  --  1.6*  --   --   --   --   --    GFR: Estimated Creatinine Clearance: 40.9 mL/min (by C-G formula based on SCr of 0.44 mg/dL). Liver Function Tests: Recent Labs  Lab 07/20/21 1155 07/20/21 2302 07/21/21 0433  AST 20  --  17  ALT 14  --  12  ALKPHOS 54  --  53  BILITOT 0.8  --  1.4*  PROT 6.9  --  6.5  ALBUMIN 3.4* 2.8* 3.0*   No results for input(s): LIPASE, AMYLASE in the last 168 hours. No results for input(s): AMMONIA in the last 168 hours. Coagulation Profile: Recent Labs  Lab 07/21/21 0433  INR 1.2   Cardiac Enzymes: No results for input(s): CKTOTAL, CKMB, CKMBINDEX, TROPONINI in the last 168 hours. BNP (last 3 results) No results for input(s): PROBNP in the last 8760 hours. HbA1C: No results for input(s): HGBA1C in the last 72  hours. CBG: No results for input(s): GLUCAP in the last 168 hours. Lipid Profile: No results for input(s): CHOL, HDL, LDLCALC, TRIG, CHOLHDL, LDLDIRECT in the last 72 hours. Thyroid Function Tests: No results for input(s): TSH, T4TOTAL, FREET4, T3FREE, THYROIDAB in the last 72 hours. Anemia Panel: No results for input(s): VITAMINB12, FOLATE, FERRITIN, TIBC, IRON, RETICCTPCT in the last 72 hours.    Radiology Studies: I have reviewed all of the imaging during this hospital visit personally     Scheduled Meds:  diltiazem  240 mg Oral Daily   dorzolamide-timolol  1 drop Both Eyes BID   enoxaparin (LOVENOX) injection  40 mg Subcutaneous Q24H   escitalopram  20 mg Oral Daily   feeding supplement  237 mL Oral TID BM  fidaxomicin  200 mg Oral BID   fluticasone  2 spray Each Nare Daily   gabapentin  100 mg Oral TID   guaiFENesin  600 mg Oral BID   ipratropium-albuterol  3 mL Nebulization TID   irbesartan  300 mg Oral Daily   [START ON 07/26/2021] loperamide  2 mg Oral QAC breakfast   nystatin  5 mL Oral QID   potassium chloride  40 mEq Oral BID   Continuous Infusions:  sodium chloride 15 mL/hr at 07/25/21 0930     LOS: 5 days        Camiyah Friberg Gerome Apley, MD

## 2021-07-25 NOTE — Progress Notes (Signed)
Dtg at beside, pt resting denies pain. Wheezing improved. Will cont to monitor. SRP, RN

## 2021-07-25 NOTE — Care Management (Signed)
30 Day [place] Note  RE:  [id number]       Date of Birth: __03-Jun-1942   Date: 07/25/2021       To Whom It May Concern:  Please be advised that the above-named patient will require a short-term nursing home stay - anticipated 30 days or less for rehabilitation and strengthening.  The plan is for return home.   Purcell Mouton, [place], [place]

## 2021-07-25 NOTE — Progress Notes (Signed)
Left lower extremity venous duplex has been completed. Preliminary results can be found in CV Proc through chart review.   07/25/21 9:59 AM [name] RVT

## 2021-07-25 NOTE — Progress Notes (Signed)
Pt restless and short of breathe with expiratory wheezing and diminished breathes sounds, resp rounding and breathing treatment complete. Decreased IV fluids and updated MD. Pt c/o of leg cramps md updated, pain med given, family at bedside. SRP, RN

## 2021-07-25 NOTE — TOC Progression Note (Signed)
Transition of Care [facility]) - Progression Note    Patient Details  Name: [name] MRN: [id number] Date of Birth: [birth year]  [facility] CM/SW Contact  Purcell Mouton, RN Phone Number: 07/25/2021, 12:58 PM  Clinical Narrative:    PASRR Level II started for pt.   Expected Discharge Plan: [place] Barriers to Discharge: No Barriers Identified  Expected Discharge Plan and Services Expected Discharge Plan: [place] arrangements for the past 2 months: [place]                                       Social Determinants of Health (SDOH) Interventions    Readmission Risk Interventions No flowsheet data found.

## 2021-07-25 NOTE — Plan of Care (Signed)
  Problem: Education: Goal: Knowledge of General Education information will improve Description Including pain rating scale, medication(s)/side effects and non-pharmacologic comfort measures Outcome: Progressing   Problem: Education: Goal: Knowledge of General Education information will improve Description Including pain rating scale, medication(s)/side effects and non-pharmacologic comfort measures Outcome: Progressing   Problem: Health Behavior/Discharge Planning: Goal: Ability to manage health-related needs will improve Outcome: Progressing   Problem: Clinical Measurements: Goal: Ability to maintain clinical measurements within normal limits will improve Outcome: Progressing   

## 2021-07-26 LAB — CBC WITH DIFFERENTIAL/PLATELET
Abs Immature Granulocytes: 0.06 10*3/uL (ref 0.00–0.07)
Basophils Absolute: 0 10*3/uL (ref 0.0–0.1)
Basophils Relative: 0 %
Eosinophils Absolute: 0.3 10*3/uL (ref 0.0–0.5)
Eosinophils Relative: 2 %
HCT: 29.3 % — ABNORMAL LOW (ref 36.0–46.0)
Hemoglobin: 9.3 g/dL — ABNORMAL LOW (ref 12.0–15.0)
Immature Granulocytes: 1 %
Lymphocytes Relative: 16 %
Lymphs Abs: 1.8 10*3/uL (ref 0.7–4.0)
MCH: 30.6 pg (ref 26.0–34.0)
MCHC: 31.7 g/dL (ref 30.0–36.0)
MCV: 96.4 fL (ref 80.0–100.0)
Monocytes Absolute: 0.8 10*3/uL (ref 0.1–1.0)
Monocytes Relative: 7 %
Neutro Abs: 8.7 10*3/uL — ABNORMAL HIGH (ref 1.7–7.7)
Neutrophils Relative %: 74 %
Platelets: 301 10*3/uL (ref 150–400)
RBC: 3.04 MIL/uL — ABNORMAL LOW (ref 3.87–5.11)
RDW: 12.9 % (ref 11.5–15.5)
WBC: 11.6 10*3/uL — ABNORMAL HIGH (ref 4.0–10.5)
nRBC: 0 % (ref 0.0–0.2)

## 2021-07-26 LAB — BASIC METABOLIC PANEL
Anion gap: 8 (ref 5–15)
BUN: 10 mg/dL (ref 8–23)
CO2: 31 mmol/L (ref 22–32)
Calcium: 8.1 mg/dL — ABNORMAL LOW (ref 8.9–10.3)
Chloride: 99 mmol/L (ref 98–111)
Creatinine, Ser: 0.44 mg/dL (ref 0.44–1.00)
GFR, Estimated: >60 mL/min (ref >60)
Glucose, Bld: 104 mg/dL — ABNORMAL HIGH (ref 70–99)
Potassium: 4.2 mmol/L (ref 3.5–5.1)
Sodium: 138 mmol/L (ref 135–145)

## 2021-07-26 LAB — MAGNESIUM: Magnesium: 1.8 mg/dL (ref 1.7–2.4)

## 2021-07-26 MED ORDER — CYCLOBENZAPRINE HCL 5 MG PO TABS
5.0000 mg | ORAL_TABLET | Freq: Three times a day (TID) | ORAL | Status: DC | PRN
  Administered 2021-07-26 – 2021-07-27 (×2): 5 mg via ORAL
  Filled 2021-07-26 (×2): qty 1

## 2021-07-26 MED ORDER — IPRATROPIUM-ALBUTEROL 0.5-2.5 (3) MG/3ML IN SOLN
3.0000 mL | Freq: Two times a day (BID) | RESPIRATORY_TRACT | Status: DC
Start: 2021-07-26 — End: 2021-07-28
  Administered 2021-07-26 – 2021-07-28 (×4): 3 mL via RESPIRATORY_TRACT
  Filled 2021-07-26 (×4): qty 3

## 2021-07-26 MED ORDER — LOPERAMIDE HCL 2 MG PO CAPS: 2.0000 mg | ORAL_CAPSULE | Freq: Two times a day (BID) | ORAL | Status: DC | PRN

## 2021-07-26 NOTE — Care Management Important Message (Signed)
Important Message  Patient Details IM Letter given to the Patient. Name: [name] MRN: [id number] Date of Birth: June 16, 1941   Medicare Important Message Given:  Yes     Kerin Salen 07/26/2021, 12:00 PM

## 2021-07-26 NOTE — Progress Notes (Addendum)
Progress Note   Subjective  Chief Complaint: Cdiff diarrhea Brief History: 80 yo female with PMH of diverticulosis, adenomatous colon polyps, GERD with previous esophageal stricture, hiatal hernia, DM2, CKD stage II, glaucoma, anxiety and hypertension. Admitted with diarrhea, recent C-diff infection. No improvement with Vancomycin. Subsequently started on Dificid as outpatient but too weak to complete treatment to came to ED and was admitted for diarrhea with suspected sepsis / hypokalemia / AKI.     Patient sitting up in her chair. Per patient and nurse had very small volume loose stool this morning while in bed, but otherwise no other diarrhea. Denies abdominal pain, nausea, vomiting, fever, chills. Patient states she is feeling much better other than left leg has some pain with stepping down on it and feels a little weak, she is being evaluated by physical therapy and primary team. Patient is also on 2 L nasal cannula which she is not on at home, has had some desaturations and cough, getting worked up with primary team, getting getting Lasix  From a GI perspective patient is doing better, needs to complete Dificid 10 day course Discussed hand washing, wiping down surfaces, and avoiding contamination.  Discussed can reoccur easily in the future, any worsening or recurrent symptoms, especially with ABX use please contact the office.  Follow up 4 weeks Follow up if any worsening symptoms or no improving symptoms   Objective   Vital signs in last 24 hours: Temp:  [98.1 F (36.7 C)-98.3 F (36.8 C)] 98.3 F (36.8 C) (12/16 0500) Pulse Rate:  [43-94] 84 (12/16 0500) Resp:  [20] 20 (12/15 1524) BP: (134-144)/(73-87) 134/73 (12/16 0500) SpO2:  [94 %-100 %] 97 % (12/16 0810) Last BM Date: 07/25/21  General:   female in no acute distress  Heart:  regular rate and rhythm, no murmurs or gallops Pulm: Clear anteriorly; no wheezing, 2 L [place] Abdomen:  Soft, Normal bowel sounds. Non  tender. Extremities:  no edema Neurologic:  Alert and  oriented x4;  grossly normal neurologically.  Psychiatric: Cooperative. Normal mood and affect.  Intake/Output from previous day: 12/15 0701 - 12/16 0700 In: -  Out: 800 [Urine:800] Intake/Output this shift: No intake/output data recorded.  Lab Results: Recent Labs    07/24/21 0400 07/25/21 0354 07/26/21 0412  WBC 14.2* 14.2* 11.6*  HGB 10.2* 9.7* 9.3*  HCT 31.2* 30.9* 29.3*  PLT 327 317 301   BMET Recent Labs    07/24/21 0400 07/25/21 0354 07/26/21 0412  NA 139 139 138  K 3.7 4.4 4.2  CL 105 104 99  CO2 27 27 31   GLUCOSE 140* 147* 104*  BUN 13 13 10   CREATININE 0.43* 0.44 0.44  CALCIUM 7.8* 8.0* 8.1*   LFT No results for input(s): PROT, ALBUMIN, AST, ALT, ALKPHOS, BILITOT, BILIDIR, IBILI in the last 72 hours. PT/INR No results for input(s): LABPROT, INR in the last 72 hours.  Studies/Results: DG CHEST PORT 1 VIEW  Result Date: 07/25/2021 CLINICAL DATA:  80 year old female with history of increasing shortness of breath and difficulty breathing this evening. EXAM: PORTABLE CHEST 1 VIEW COMPARISON:  Chest x-ray 07/23/2021. FINDINGS: Areas of interstitial prominence and patchy multifocal airspace disease are noted throughout the lungs bilaterally, most confluent in the right mid to lower lung and medial aspect of the left lower lobe. Small right pleural effusion. No definite left pleural effusion. No evidence of pulmonary edema. No pneumothorax. Heart size is normal. Upper mediastinal contours are within normal limits. Atherosclerotic calcifications in the  thoracic aorta. Orthopedic fixation hardware in the lower cervical spine incidentally noted. IMPRESSION: 1. Multilobar bilateral bronchopneumonia (right greater than left) with small right-sided parapneumonic pleural effusion. 2. Aortic atherosclerosis. Electronically Signed   By: Vinnie Langton M.D.   On: 07/25/2021 05:53   DG Ankle Left Port  Result Date:  07/24/2021 CLINICAL DATA:  Left ankle pain. EXAM: PORTABLE LEFT ANKLE - 2 VIEW COMPARISON:  None. FINDINGS: Evaluation of the forefoot is limited due to positioning. No acute fracture identified. No dislocation. There is degenerative changes of the ankle. Fixation K-wires noted in the first metatarsal as well as fixation screw in the second toe. The soft tissues are grossly unremarkable IMPRESSION: No acute fracture or dislocation. Electronically Signed   By: Anner Crete M.D.   On: 07/24/2021 19:49   VAS [place] LOWER EXTREMITY VENOUS (DVT)  Result Date: 07/25/2021  Lower Venous DVT Study Patient Name:  [name]  Date of Exam:   07/25/2021 Medical Rec #: [id number]       Accession #:    [id number] Date of Birth: [birth year]       Patient Gender: F Patient Age:   19 years Exam Location:  [facility] Procedure:      VAS [place] LOWER EXTREMITY VENOUS (DVT) Referring Phys: Jolaine Artist MEMON --------------------------------------------------------------------------------  Indications: Pain.  Risk Factors: None identified. Limitations: Poor ultrasound/tissue interface and patient positioning, patient movement. Comparison Study: No prior studies. Performing Technologist: Oliver Hum RVT  Examination Guidelines: A complete evaluation includes B-mode imaging, spectral Doppler, color Doppler, and power Doppler as needed of all accessible portions of each vessel. Bilateral testing is considered an integral part of a complete examination. Limited examinations for reoccurring indications may be performed as noted. The reflux portion of the exam is performed with the patient in reverse Trendelenburg.  +-----+---------------+---------+-----------+----------+--------------+  RIGHT Compressibility Phasicity Spontaneity Properties Thrombus Aging  +-----+---------------+---------+-----------+----------+--------------+  CFV   Full            Yes       Yes                                     +-----+---------------+---------+-----------+----------+--------------+   +---------+---------------+---------+-----------+----------+--------------+  LEFT      Compressibility Phasicity Spontaneity Properties Thrombus Aging  +---------+---------------+---------+-----------+----------+--------------+  CFV       Full            Yes       Yes                                    +---------+---------------+---------+-----------+----------+--------------+  SFJ       Full                                                             +---------+---------------+---------+-----------+----------+--------------+  FV Prox   Full                                                             +---------+---------------+---------+-----------+----------+--------------+  FV Mid    Full                                                             +---------+---------------+---------+-----------+----------+--------------+  FV Distal Full                                                             +---------+---------------+---------+-----------+----------+--------------+  PFV       Full                                                             +---------+---------------+---------+-----------+----------+--------------+  POP       Full            Yes       Yes                                    +---------+---------------+---------+-----------+----------+--------------+  PTV       Full                                                             +---------+---------------+---------+-----------+----------+--------------+  PERO      Full                                                             +---------+---------------+---------+-----------+----------+--------------+    Summary: RIGHT: - No evidence of common femoral vein obstruction.  LEFT: - There is no evidence of deep vein thrombosis in the lower extremity.  - No cystic structure found in the popliteal fossa.  *See table(s) above for measurements and observations. Electronically signed  by Monica Martinez MD on 07/25/2021 at 7:40:17 PM.    Final        Impression/plan:   Acute on chronic ( mild) diarrhea in setting of recent C-diff ( mid November). P ersistent diarrhea after course of Vancomycin.  Negative C-diff antigen and toxin in hospital on 07/21/21.  Diarrhea resolving  --Colonoscopy May 2022 ( diverticulosis, small TA, random colon biopsies negative for microscopic colitis) --No colon wall thickening on CTAP --Afebrile. WBC still elevated but better overall 17.6 >> 14.2-->11.6 --Continue BID Dificid , on day # 7. Needs total of 10 days --Continue imodium as needed   Non cardiogenic pulmonary edema with hypoxia On 2 L O2  Getting lasix by primary.   [place] anemia,  new.  Baseline hgb 12.3 >>10.6 >> 9.7-->9.3.  Possibly dilutional related to IV fluids.                           --  Colonoscopy May 2022 ( diverticulosis, small TA, random colon biopsies negative for microscopic colitis)   Oral candidiasis secondary to antibiotics.  --Continue Nystatin S+S   Debility / weakness. PT following.      LOS: 6 days   Vladimir Crofts  07/26/2021, 10:51 AM  ________________________________________________________________________  Velora Heckler GI MD note:  I personally examined the patient, reviewed the data and agree with the assessment and plan described above.  Her diarrhea has completley stopped. No BM in 48 hours or so now.  Breathing is a bit better after lasix.  Still has leg pains.    She needs to complete the BID dificid (currently day7/10). Can take imodium PRN only   Seems like GI issues are much improved. WE are signing off for now but still available 24/7 for questions or concerns.  We will arrange GI office follow up in about 3-4 weeks.   Owens Loffler, MD [facility] G[facility]enter[facility] Pager [phone]

## 2021-07-26 NOTE — Plan of Care (Signed)
  Problem: Education: Goal: Knowledge of General Education information will improve Description Including pain rating scale, medication(s)/side effects and non-pharmacologic comfort measures Outcome: Progressing   Problem: Education: Goal: Knowledge of General Education information will improve Description Including pain rating scale, medication(s)/side effects and non-pharmacologic comfort measures Outcome: Progressing   Problem: Health Behavior/Discharge Planning: Goal: Ability to manage health-related needs will improve Outcome: Progressing   Problem: Clinical Measurements: Goal: Ability to maintain clinical measurements within normal limits will improve Outcome: Progressing   

## 2021-07-26 NOTE — Progress Notes (Signed)
Physical Therapy Treatment Patient Details Name: [name] LECOUNT MRN: [id number] DOB: March 12, 1941 Today's Date: 07/26/2021   History of Present Illness 80 yo female admitted with Cdiff, sepsis. Hx of DM, Cdiff, SVt, ACDF 2013, vertigo    PT Comments    Pt demonstrating good mobility progression today, ambulatory 60+ ft in hallway and overall requiring light steadying assist during mobility. PT's main concern is pt lives home alone, if pt had frequent support at home or could stay with family pt may be appropriate for home with HHPT. At this time, SNF is still appropriate given pt's PLOF being independent and she lives alone. PT to continue to follow.    Recommendations for follow up therapy are one component of a multi-disciplinary discharge planning process, led by the attending physician.  Recommendations may be updated based on patient status, additional functional criteria and insurance authorization.  Follow Up Recommendations  Skilled nursing-short term rehab (<3 hours/day)     Assistance Recommended at Discharge Frequent or constant Supervision/Assistance  Equipment Recommendations  Rolling walker (2 wheels)    Recommendations for Other Services       Precautions / Restrictions Precautions Precautions: Fall Restrictions Weight Bearing Restrictions: No     Mobility  Bed Mobility Overal bed mobility: Needs Assistance Bed Mobility: Supine to Sit     Supine to sit: HOB elevated;Min guard     General bed mobility comments: close guard for safety, cues for bedrail use    Transfers Overall transfer level: Needs assistance Equipment used: Rolling walker (2 wheels) Transfers: Sit to/from Stand;Bed to chair/wheelchair/BSC Sit to Stand: Min assist Stand pivot transfers: Min assist         General transfer comment: min assist for power up, rise, steadying; min assist for pivot to [facility] for steadying and guiding hips onto BSC. STS x5 from recliner for strengthening     Ambulation/Gait Ambulation/Gait assistance: Min assist Gait Distance (Feet): 60 Feet Assistive device: Rolling walker (2 wheels) Gait Pattern/deviations: Step-through pattern;Decreased stride length;Trunk flexed Gait velocity: decr     General Gait Details: light steadying assist, especially during directional changes   Stairs             Wheelchair Mobility    Modified Rankin (Stroke Patients Only)       Balance Overall balance assessment: Needs assistance Sitting-balance support: No upper extremity supported;Feet supported Sitting balance-Leahy Scale: Good     Standing balance support: Bilateral upper extremity supported Standing balance-Leahy Scale: Poor                              Cognition Arousal/Alertness: Awake/alert Behavior During Therapy: WFL for tasks assessed/performed Overall Cognitive Status: Within Functional Limits for tasks assessed                                          Exercises      General Comments General comments (skin integrity, edema, etc.): 1LO2 removed during session, reapplied at end of session      Pertinent Vitals/Pain Pain Assessment: Faces Faces Pain Scale: Hurts little more Pain Location: LLE and top of foot Pain Descriptors / Indicators: Grimacing;Restless;Cramping;Spasm Pain Intervention(s): Limited activity within patient's tolerance;Monitored during session;Repositioned    Home Living  Prior Function            PT Goals (current goals can now be found in the care plan section) Acute Rehab PT Goals Patient Stated Goal: family requesting CIR PT Goal Formulation: With patient/family Time For Goal Achievement: 08/05/21 Potential to Achieve Goals: Good Progress towards PT goals: Progressing toward goals    Frequency    Min 3X/week      PT Plan Current plan remains appropriate    Co-evaluation              AM-PAC PT "6 Clicks"  Mobility   Outcome Measure  Help needed turning from your back to your side while in a flat bed without using bedrails?: A Little Help needed moving from lying on your back to sitting on the side of a flat bed without using bedrails?: A Little Help needed moving to and from a bed to a chair (including a wheelchair)?: A Little Help needed standing up from a chair using your arms (e.g., wheelchair or bedside chair)?: A Little Help needed to walk in hospital room?: A Little Help needed climbing 3-5 steps with a railing? : A Lot 6 Click Score: 17    End of Session Equipment Utilized During Treatment: Gait belt Activity Tolerance: Patient limited by pain Patient left: in chair;with call bell/phone within reach;with chair alarm set Nurse Communication: Mobility status PT Visit Diagnosis: Muscle weakness (generalized) (M62.81);Difficulty in walking, not elsewhere classified (R26.2);History of falling (Z91.81);Pain Pain - Right/Left: Left Pain - part of body: Ankle and joints of foot;Leg     Time: 1449-1516 PT Time Calculation (min) (ACUTE ONLY): 27 min  Charges:  $Gait Training: 8-22 mins $Therapeutic Activity: 8-22 mins                     Stacie Glaze, PT DPT Acute Rehabilitation Services Pager [phone]  Office [phone]    Louis Matte 07/26/2021, 4:39 PM

## 2021-07-26 NOTE — Plan of Care (Signed)
°  Problem: Education: Goal: Knowledge of General Education information will improve Description: Including pain rating scale, medication(s)/side effects and non-pharmacologic comfort measures Outcome: Progressing   Problem: Clinical Measurements: Goal: Ability to maintain clinical measurements within normal limits will improve Outcome: Progressing Goal: Cardiovascular complication will be avoided Outcome: Progressing   Problem: Coping: Goal: Level of anxiety will decrease Outcome: Progressing   Problem: Elimination: Goal: Will not experience complications related to bowel motility Outcome: Progressing Goal: Will not experience complications related to urinary retention Outcome: Progressing   Problem: Pain Managment: Goal: General experience of comfort will improve Outcome: Progressing   Problem: Safety: Goal: Ability to remain free from injury will improve Outcome: Progressing   Problem: Skin Integrity: Goal: Risk for impaired skin integrity will decrease Outcome: Progressing   Problem: Clinical Measurements: Goal: Will remain free from infection Outcome: Not Progressing Goal: Diagnostic test results will improve Outcome: Not Progressing Goal: Respiratory complications will improve Outcome: Not Progressing   Problem: Activity: Goal: Risk for activity intolerance will decrease Outcome: Not Progressing   Problem: Nutrition: Goal: Adequate nutrition will be maintained Outcome: Not Progressing

## 2021-07-26 NOTE — Progress Notes (Signed)
PROGRESS NOTE    [name]  GEX:[id number] DOB: [birth year] DOA: 07/20/2021 PCP: [name], MD    Brief Narrative:  [name] was admitted to the hospital with the working diagnosis of diarrhea post C diff colitis.    80 yo female with the past medical history of GERD, hypertension, dyslipidemia, who presented with cough and diarrhea. She was diagnosed with C diff as outpatient after taking antibiotic therapy for 10 days (amoxicillin) for an upper respiratory tract infection. She took oral vancomycin for 10 days, but continue to have diarrhea. She developed general weakness that prompted her to come to the hospital. On her initial physical examination blood pressure 159/76, HR 106, rr 27 and oxygen saturation 94%. Lungs  with decreased breath sounds, heart with S1 and S2 present, abdomen soft and non tender, no lower extremity edema.    Sodium 137 potassium 2.7 chloride 99, bicarb 29, glucose 132, BUN 6, creatinine 0.53, white count 14.1, globin 11.6, hematocrit 35.2, platelets 265 SARS COVID-19 negative.   Urinalysis specific gravity 1.011 Stool negative for C. Difficile   CT abdomen with diffuse colonic diverticulosis without diverticulitis no inflammatory changes.  Bilateral nonobstructing renal calculi, largest in the right kidney measuring 10 mm   131 bpm, left axis deviation, normal intervals, sinus rhythm with PVC, no significant st segment or t wave changes    Patient has been placed on fidoxamicin    12/15 chest film with increase interstitial infiltrates more on the right lower lobe, consistent with acute pulmonary edema (non cardiogenic).    Assessment & Plan:   Principal Problem:   C. difficile diarrhea Active Problems:   Hyperlipidemia associated with type 2 diabetes mellitus ([place])   Hypertension associated with diabetes ([place])   GERD (gastroesophageal reflux disease)   Diabetes mellitus without complication ([facility])   Sepsis ([place])   CKD (chronic kidney  disease) stage 2, GFR 60-89 ml/min   Hypokalemia     C diff diarrhea Patient with no further diarrhea, plan to continue with fidaxomicin to complete 10 days.  Patient very weak and deconditioned    2. Hypokalemia due to GI losses/ CKD stage 2.  Stable renal function and electrolytes with serum cr at 0,44, K is 4,2 and serum bicarbonate at 31. Diarrhea has resolved.    3. Non cardiogenic pulmonary edema with acute hypoxemic respiratory failure.  Chest film from 12./15 personally reviewed with bilateral interstitial infiltrates more predominant on the right base.  Improvement in volume status after furosemide, plan to wean off supplemental oxygen    4. T2DM/ dyslipidemia  Continue glucose cover and monitoring with insulin sliding scale, patient is tolerating po well. Continue with statin therapy.    5. HTN  Blood pressure control with diltiazem, and irbesartan.   6. Anxiety/ depression. Continue with alprazolam and escitalopram.   Status is: Inpatient  Remains inpatient appropriate because: pending transfer to SNF   DVT prophylaxis: Enoxaparin   Code Status:    full  Family Communication:   No family at the bedside     Consultants:  GI    Antimicrobials:  Fidamaxcin     Subjective: Patient with no diarrhea, no nausea or vomiting, at the time of my examination no leg pain or cramps. Continue to be very weak and deconditioned   Objective: Vitals:   07/25/21 2004 07/25/21 2126 07/26/21 0500 07/26/21 0810  BP:  (!) 144/73 134/73   Pulse:  94 84   Resp:      Temp:  98.1 F (36.7 C) 98.3 F (36.8 C)   TempSrc:  Oral Oral   SpO2: 94% 98% 100% 97%  Weight:      Height:        Intake/Output Summary (Last 24 hours) at 07/26/2021 1236 Last data filed at 07/25/2021 2004 Gross per 24 hour  Intake --  Output 800 ml  Net -800 ml   Filed Weights   07/21/21 0300  Weight: 53.1 kg    Examination:   General: Not in pain or dyspnea, deconditioned  Neurology:  Awake and alert, non focal  E ENT: mild pallor, no icterus, oral mucosa moist Cardiovascular: No JVD. S1-S2 present, rhythmic, no gallops, rubs, or murmurs. No lower extremity edema. Pulmonary: positive breath sounds bilaterally, with no wheezing, rhonchi or rales. Gastrointestinal. Abdomen soft and non tender Skin. No rashes Musculoskeletal: no joint deformities     Data Reviewed: I have personally reviewed following labs and imaging studies  CBC: Recent Labs  Lab 07/22/21 0354 07/23/21 0403 07/24/21 0400 07/25/21 0354 07/26/21 0412  WBC 17.6* 15.8* 14.2* 14.2* 11.6*  NEUTROABS 14.6* 13.2* 11.3* 10.6* 8.7*  HGB 10.6* 10.9* 10.2* 9.7* 9.3*  HCT 32.2* 33.4* 31.2* 30.9* 29.3*  MCV 92.8 92.5 94.3 97.8 96.4  PLT 295 303 327 317 007   Basic Metabolic Panel: Recent Labs  Lab 07/20/21 2302 07/21/21 0433 07/22/21 0354 07/23/21 0403 07/24/21 0400 07/25/21 0354 07/26/21 0412  NA 137   < > 136 136 139 139 138  K 2.7*   < > 3.0* 3.4* 3.7 4.4 4.2  CL 99   < > 101 104 105 104 99  CO2 29   < > 25 24 27 27 31   GLUCOSE 132*   < > 143* 141* 140* 147* 104*  BUN 6*   < > 13 14 13 13 10   CREATININE 0.53   < > 0.42* 0.45 0.43* 0.44 0.44  CALCIUM 7.2*   < > 7.5* 7.5* 7.8* 8.0* 8.1*  MG  --   --  1.9 2.1 1.8 1.9 1.8  PHOS 1.6*  --   --   --   --   --   --    < > = values in this interval not displayed.   GFR: Estimated Creatinine Clearance: 40.9 mL/min (by C-G formula based on SCr of 0.44 mg/dL). Liver Function Tests: Recent Labs  Lab 07/20/21 1155 07/20/21 2302 07/21/21 0433  AST 20  --  17  ALT 14  --  12  ALKPHOS 54  --  53  BILITOT 0.8  --  1.4*  PROT 6.9  --  6.5  ALBUMIN 3.4* 2.8* 3.0*   No results for input(s): LIPASE, AMYLASE in the last 168 hours. No results for input(s): AMMONIA in the last 168 hours. Coagulation Profile: Recent Labs  Lab 07/21/21 0433  INR 1.2   Cardiac Enzymes: No results for input(s): CKTOTAL, CKMB, CKMBINDEX, TROPONINI in the last 168  hours. BNP (last 3 results) No results for input(s): PROBNP in the last 8760 hours. HbA1C: No results for input(s): HGBA1C in the last 72 hours. CBG: No results for input(s): GLUCAP in the last 168 hours. Lipid Profile: No results for input(s): CHOL, HDL, LDLCALC, TRIG, CHOLHDL, LDLDIRECT in the last 72 hours. Thyroid Function Tests: No results for input(s): TSH, T4TOTAL, FREET4, T3FREE, THYROIDAB in the last 72 hours. Anemia Panel: No results for input(s): VITAMINB12, FOLATE, FERRITIN, TIBC, IRON, RETICCTPCT in the last 72 hours.    Radiology Studies: I have  reviewed all of the imaging during this hospital visit personally     Scheduled Meds:  diltiazem  240 mg Oral Daily   dorzolamide-timolol  1 drop Both Eyes BID   enoxaparin (LOVENOX) injection  40 mg Subcutaneous Q24H   escitalopram  20 mg Oral Daily   feeding supplement  237 mL Oral TID BM   fidaxomicin  200 mg Oral BID   fluticasone  2 spray Each Nare Daily   gabapentin  100 mg Oral TID   guaiFENesin  600 mg Oral BID   ipratropium-albuterol  3 mL Nebulization BID   irbesartan  300 mg Oral Daily   nystatin  5 mL Oral QID   Continuous Infusions:   LOS: 6 days        [name] Gerome Apley, MD

## 2021-07-26 NOTE — Progress Notes (Signed)
Occupational Therapy Treatment Patient Details Name: [name] MRN: [id number] DOB: February 07, 1941 Today's Date: 07/26/2021   History of present illness 80 yo female admitted with Cdiff, sepsis. Hx of DM, Cdiff, SVt, ACDF 2013, vertigo   OT comments  Patient continues to report pain in left foot (top, ankle, side of foot) but able to ambulate to bathroom with walker with therapist providing verbal cues for proper technique and min assist for intermittent steadying. Patient abel to don socks in seated position, able to perform toileting with min guard in seated position and min assist for steadying for toilet transfer. Patient able to wash up lower body seated on toilet. Patient became increasingly restless seated on toilet with some constant and writhing body movements. When asked if she was in pain she said "no" and "I've gotten restless here." She was a little more unsteady on return to bed with walker and immediately reported pain when returned to supine and reported spasms/pain in bilateral hips and legs. Therapist requested pain medicine from RN. Patient has made progress but she is unable to manage her self safely or independently at home (she lives alone). Therapist continues to recommend short term rehab.    Recommendations for follow up therapy are one component of a multi-disciplinary discharge planning process, led by the attending physician.  Recommendations may be updated based on patient status, additional functional criteria and insurance authorization.    Follow Up Recommendations  Skilled nursing-short term rehab (<3 hours/day)    Assistance Recommended at Discharge Frequent or constant Supervision/Assistance  Equipment Recommendations  Other (comment) (TBD)    Recommendations for Other Services      Precautions / Restrictions Precautions Precautions: Fall Precaution Comments: L ankle pain, restlessness, pain/spasms in back when she lays down Restrictions Weight Bearing  Restrictions: No       Mobility Bed Mobility   Bed Mobility: Sit to Supine       Sit to supine: Supervision   General bed mobility comments: Supervision to return to supine after activity. patient immediately reported pain in left hip then spasms in bilateral hips and in legs. Patient positioned on left side with pillow between legs for comfort.    Transfers Overall transfer level: Needs assistance Equipment used: Rolling walker (2 wheels) Transfers: Sit to/from Stand;Bed to chair/wheelchair/BSC Sit to Stand: Min assist           General transfer comment: Min assist to stand with RW and ambulate to bathroom and back. Min assist for intermittent steadying, Patiet became more restless the longer she ambulated but without significant reports of pain except in left ankle/side of foot but she compensated with walker. ON toilet she was restless but unable to describe why. Was less steady on return to bed.     Balance Overall balance assessment: Needs assistance Sitting-balance support: No upper extremity supported;Feet supported Sitting balance-Leahy Scale: Good     Standing balance support: Bilateral upper extremity supported Standing balance-Leahy Scale: Poor                             ADL either performed or assessed with clinical judgement   ADL Overall ADL's : Needs assistance/impaired             Lower Body Bathing: Minimal assistance Lower Body Bathing Details (indicate cue type and reason): partial bath seated on toilet     Lower Body Dressing: Minimal assistance;Sit to/from stand Lower Body Dressing Details (indicate cue type  and reason): able to don socks in seated position, min assist to get right foot in underwear,  - requires min assist wtih standing for steadying Toilet Transfer: Minimal assistance;Regular Toilet;Rolling walker (2 wheels);Grab bars Toilet Transfer Details (indicate cue type and reason): min assist to steady Toileting-  Clothing Manipulation and Hygiene: Minimal assistance;Sit to/from stand              Extremity/Trunk Assessment              Vision Baseline Vision/History: 1 Wears glasses Patient Visual Report: No change from baseline     Perception     Praxis      Cognition Arousal/Alertness: Awake/alert Behavior During Therapy: WFL for tasks assessed/performed Overall Cognitive Status: Within Functional Limits for tasks assessed                                            Exercises     Shoulder Instructions       General Comments      Pertinent Vitals/ Pain       Pain Assessment: Faces Faces Pain Scale: Hurts even more Pain Location: L hip/low back/ankle/foot Pain Descriptors / Indicators: Grimacing;Restless;Cramping;Spasm Pain Intervention(s): Limited activity within patient's tolerance;Patient requesting pain meds-RN notified  Home Living                                          Prior Functioning/Environment              Frequency  Min 2X/week        Progress Toward Goals  OT Goals(current goals can now be found in the care plan section)  Progress towards OT goals: Progressing toward goals  Acute Rehab OT Goals Patient Stated Goal: to be independent OT Goal Formulation: With patient Time For Goal Achievement: 08/06/21 Potential to Achieve Goals: Good  Plan Discharge plan remains appropriate    Co-evaluation                 AM-PAC OT "6 Clicks" Daily Activity     Outcome Measure   Help from another person eating meals?: None Help from another person taking care of personal grooming?: A Little Help from another person toileting, which includes using toliet, bedpan, or urinal?: A Little Help from another person bathing (including washing, rinsing, drying)?: A Little Help from another person to put on and taking off regular upper body clothing?: A Little Help from another person to put on and taking off  regular lower body clothing?: A Little 6 Click Score: 19    End of Session Equipment Utilized During Treatment: Gait belt;Rolling walker (2 wheels)  OT Visit Diagnosis: Other abnormalities of gait and mobility (R26.89);Unsteadiness on feet (R26.81);Muscle weakness (generalized) (M62.81)   Activity Tolerance Patient limited by pain   Patient Left in bed;with call bell/phone within reach;with bed alarm set   Nurse Communication Patient requests pain meds        Time: 2694-8546 OT Time Calculation (min): 27 min  Charges: OT General Charges $OT Visit: 1 Visit OT Treatments $Self Care/Home Management : 8-22 mins $Therapeutic Activity: 8-22 mins  Raigen Jagielski, OTR/L [place]  Office [phone] Pager: [employer] 07/26/2021, 11:12 AM

## 2021-07-27 NOTE — Progress Notes (Signed)
PROGRESS NOTE    KEBRINA FRIEND  IWP:[id number] DOB: 14-Jul-1941 DOA: 07/20/2021 PCP: [name], MD    Brief Narrative:  Mrs. Micallef was admitted to the hospital with the working diagnosis of diarrhea post C diff colitis.    80 yo female with the past medical history of GERD, hypertension, dyslipidemia, who presented with cough and diarrhea. She was diagnosed with C diff as outpatient after taking antibiotic therapy for 10 days (amoxicillin) for an upper respiratory tract infection. She took oral vancomycin for 10 days, but continue to have diarrhea. She developed general weakness that prompted her to come to the hospital. On her initial physical examination blood pressure 159/76, HR 106, rr 27 and oxygen saturation 94%. Lungs  with decreased breath sounds, heart with S1 and S2 present, abdomen soft and non tender, no lower extremity edema.    Sodium 137 potassium 2.7 chloride 99, bicarb 29, glucose 132, BUN 6, creatinine 0.53, white count 14.1, globin 11.6, hematocrit 35.2, platelets 265 SARS COVID-19 negative.   Urinalysis specific gravity 1.011 Stool negative for C. Difficile   CT abdomen with diffuse colonic diverticulosis without diverticulitis no inflammatory changes.  Bilateral nonobstructing renal calculi, largest in the right kidney measuring 10 mm   131 bpm, left axis deviation, normal intervals, sinus rhythm with PVC, no significant st segment or t wave changes    Patient has been placed on fidoxamicin    12/15 chest film with increase interstitial infiltrates more on the right lower lobe, consistent with acute pulmonary edema (non cardiogenic).    Patient medically stable to be transfer to SNF   Assessment & Plan:   Principal Problem:   C. difficile diarrhea Active Problems:   Hyperlipidemia associated with type 2 diabetes mellitus ([place])   Hypertension associated with diabetes ([place])   GERD (gastroesophageal reflux disease)   Diabetes mellitus without  complication (HCC)   Sepsis (HCC)   CKD (chronic kidney disease) stage 2, GFR 60-89 ml/min   Hypokalemia     C diff diarrhea  No further diarrhea, but patient very weak and deconditioned  Plan to continue with fidaxomicin to complete 10 days.  Continue with nutritional supplementation, PT and OT.   Discontinue telemetry monitoring    2. Hypokalemia due to GI losses/ CKD stage 2.  Patient is tolerating po well, renal function has been stable. No blood work for today.    3. Non cardiogenic pulmonary edema with acute hypoxemic respiratory failure.  Chest film from 12./15 personally reviewed with bilateral interstitial infiltrates more predominant on the right base.   Resolved pulmonary edema, plan to wean off to room air.    4. T2DM/ dyslipidemia  Her glucose has been stable, insulin therapy has been discontinued. Continue checking capillary glucose as needed.    5. HTN  Continue with diltiazem, and irbesartan for blood pressure control.   6. Anxiety/ depression.  On alprazolam and escitalopram   Status is: Inpatient  Remains inpatient appropriate because: pending transfer to SNF    DVT prophylaxis: Enoxaparin   Code Status:    full  Family Communication:   No family at the bedside     Consultants:   GI     Subjective: Patient is feeling better, no legs cramps, no nausea or vomiting, no chest pain or chest pain.  Continue to be very weak and deconditioned,   Objective: Vitals:   07/26/21 1935 07/26/21 2134 07/27/21 0436 07/27/21 0913  BP:  (!) 143/71 (!) 149/79   Pulse:  86 71   Resp:      Temp:  98.1 F (36.7 C) 98.2 F (36.8 C)   TempSrc:  Oral    SpO2: 99% 97% 97% 97%  Weight:      Height:        Intake/Output Summary (Last 24 hours) at 07/27/2021 1222 Last data filed at 07/27/2021 0400 Gross per 24 hour  Intake --  Output 1300 ml  Net -1300 ml   Filed Weights   07/21/21 0300  Weight: 53.1 kg    Examination:   General: Not in pain or  dyspnea, deconditioned  Neurology: Awake and alert, non focal  E ENT: mild pallor, no icterus, oral mucosa moist Cardiovascular: No JVD. S1-S2 present, rhythmic, no gallops, rubs, or murmurs. No lower extremity edema. Pulmonary: positive breath sounds bilaterally, adequate air movement, no wheezing, rhonchi or rales. Gastrointestinal. Abdomen soft and non tender Skin. No rashes Musculoskeletal: no joint deformities     Data Reviewed: I have personally reviewed following labs and imaging studies  CBC: Recent Labs  Lab 07/22/21 0354 07/23/21 0403 07/24/21 0400 07/25/21 0354 07/26/21 0412  WBC 17.6* 15.8* 14.2* 14.2* 11.6*  NEUTROABS 14.6* 13.2* 11.3* 10.6* 8.7*  HGB 10.6* 10.9* 10.2* 9.7* 9.3*  HCT 32.2* 33.4* 31.2* 30.9* 29.3*  MCV 92.8 92.5 94.3 97.8 96.4  PLT 295 303 327 317 387   Basic Metabolic Panel: Recent Labs  Lab 07/20/21 2302 07/21/21 0433 07/22/21 0354 07/23/21 0403 07/24/21 0400 07/25/21 0354 07/26/21 0412  NA 137   < > 136 136 139 139 138  K 2.7*   < > 3.0* 3.4* 3.7 4.4 4.2  CL 99   < > 101 104 105 104 99  CO2 29   < > 25 24 27 27 31   GLUCOSE 132*   < > 143* 141* 140* 147* 104*  BUN 6*   < > 13 14 13 13 10   CREATININE 0.53   < > 0.42* 0.45 0.43* 0.44 0.44  CALCIUM 7.2*   < > 7.5* 7.5* 7.8* 8.0* 8.1*  MG  --   --  1.9 2.1 1.8 1.9 1.8  PHOS 1.6*  --   --   --   --   --   --    < > = values in this interval not displayed.   GFR: Estimated Creatinine Clearance: 40.9 mL/min (by C-G formula based on SCr of 0.44 mg/dL). Liver Function Tests: Recent Labs  Lab 07/20/21 2302 07/21/21 0433  AST  --  17  ALT  --  12  ALKPHOS  --  53  BILITOT  --  1.4*  PROT  --  6.5  ALBUMIN 2.8* 3.0*   No results for input(s): LIPASE, AMYLASE in the last 168 hours. No results for input(s): AMMONIA in the last 168 hours. Coagulation Profile: Recent Labs  Lab 07/21/21 0433  INR 1.2   Cardiac Enzymes: No results for input(s): CKTOTAL, CKMB, CKMBINDEX, TROPONINI  in the last 168 hours. BNP (last 3 results) No results for input(s): PROBNP in the last 8760 hours. HbA1C: No results for input(s): HGBA1C in the last 72 hours. CBG: No results for input(s): GLUCAP in the last 168 hours. Lipid Profile: No results for input(s): CHOL, HDL, LDLCALC, TRIG, CHOLHDL, LDLDIRECT in the last 72 hours. Thyroid Function Tests: No results for input(s): TSH, T4TOTAL, FREET4, T3FREE, THYROIDAB in the last 72 hours. Anemia Panel: No results for input(s): VITAMINB12, FOLATE, FERRITIN, TIBC, IRON, RETICCTPCT in the last 72 hours.  Radiology Studies: I have reviewed all of the imaging during this hospital visit personally     Scheduled Meds:  diltiazem  240 mg Oral Daily   dorzolamide-timolol  1 drop Both Eyes BID   enoxaparin (LOVENOX) injection  40 mg Subcutaneous Q24H   escitalopram  20 mg Oral Daily   feeding supplement  237 mL Oral TID BM   fidaxomicin  200 mg Oral BID   fluticasone  2 spray Each Nare Daily   gabapentin  100 mg Oral TID   guaiFENesin  600 mg Oral BID   ipratropium-albuterol  3 mL Nebulization BID   irbesartan  300 mg Oral Daily   nystatin  5 mL Oral QID   Continuous Infusions:   LOS: 7 days        Shogo Larkey Gerome Apley, MD

## 2021-07-28 NOTE — Progress Notes (Addendum)
ADM: C-DIFF HOME REGIMEN: ALBUTEROL INHALER PRN ASSESSMENT: NO WOB, NO SOB, PT ON RA, BS-CL-DIM, PT IN CHAIR MEDICAL HX: GERD, C-DIFF, DM2,SVT,HLD,HTN X-RAY: NONE CURRENT LAST ONE ON 07/25/21 CURRENT THERAPY: DUONEB BID, ALBUTEROL Q4 PRN PLAN: ALBUTEROL Q4 PRN RT CHANGE THERAPY DUE TO PT CONDITION. PT ON RA WITH NO CURRENT X-RAY.

## 2021-07-28 NOTE — Progress Notes (Signed)
Pt attempted to ambulate to hallway walker and nurse assistance.  Pt stated bottom of right foot had sharp pain and she was unable to bear weight.  Pt made it to doorway and we had to turn back.  She stated this was the worst her pain has been since being here.  Administered tramadol 30 min before walking and gabapentin after.   Will continue to monitor.

## 2021-07-28 NOTE — TOC Progression Note (Signed)
Transition of Care [facility]) - Progression Note    Patient Details  Name: [name] MRN: [id number] Date of Birth: 17-Sep-1940  Transition of Care [facility]) CM/SW Contact  Ross Ludwig, [place] Phone Number: 07/28/2021, 11:59 AM  Clinical Narrative:     30 day [place] number has been received, [phone] valid through 08/24/21.  CSW to continue to follow patient's progress throughout discharge planning.   Expected Discharge Plan: [place] Barriers to Discharge: No Barriers Identified  Expected Discharge Plan and Services Expected Discharge Plan: [place] arrangements for the past 2 months: [place]                                       Social Determinants of Health (SDOH) Interventions    Readmission Risk Interventions No flowsheet data found.

## 2021-07-28 NOTE — Progress Notes (Signed)
PROGRESS NOTE    LOWEN MANSOURI  ZJI:[id number] DOB: 1940-10-22 DOA: 07/20/2021 PCP: [name], MD    Brief Narrative:  Mrs. Bassinger was admitted to the hospital with the working diagnosis of diarrhea post C diff colitis.    80 yo female with the past medical history of GERD, hypertension, dyslipidemia, who presented with cough and diarrhea. She was diagnosed with C diff as outpatient after taking antibiotic therapy for 10 days (amoxicillin) for an upper respiratory tract infection. She took oral vancomycin for 10 days, but continue to have diarrhea. She developed general weakness that prompted her to come to the hospital. On her initial physical examination blood pressure 159/76, HR 106, rr 27 and oxygen saturation 94%. Lungs  with decreased breath sounds, heart with S1 and S2 present, abdomen soft and non tender, no lower extremity edema.    Sodium 137 potassium 2.7 chloride 99, bicarb 29, glucose 132, BUN 6, creatinine 0.53, white count 14.1, globin 11.6, hematocrit 35.2, platelets 265 SARS COVID-19 negative.   Urinalysis specific gravity 1.011 Stool negative for C. Difficile   CT abdomen with diffuse colonic diverticulosis without diverticulitis no inflammatory changes.  Bilateral nonobstructing renal calculi, largest in the right kidney measuring 10 mm   131 bpm, left axis deviation, normal intervals, sinus rhythm with PVC, no significant st segment or t wave changes    Patient has been placed on fidoxamicin, because high pretest probability for persistent C diff.     12/15 chest film with increase interstitial infiltrates more on the right lower lobe, consistent with acute pulmonary edema (non cardiogenic).    Patient medically stable to be transfer to SNF     Assessment & Plan:   Principal Problem:   C. difficile diarrhea Active Problems:   Hyperlipidemia associated with type 2 diabetes mellitus ([place])   Hypertension associated with diabetes ([place])   GERD  (gastroesophageal reflux disease)   Diabetes mellitus without complication (HCC)   Sepsis (HCC)   CKD (chronic kidney disease) stage 2, GFR 60-89 ml/min   Hypokalemia     C diff diarrhea  Continue with fidaxomicin to complete 10 days.  Her diarrhea has resolved. .  Patient very weak and deconditioned, pending to be transfer to SNF.   2. Hypokalemia due to GI losses/ CKD stage 2.  Patient has been tolerating po well, will check electrolytes and renal function in am.    3. Non cardiogenic pulmonary edema with acute hypoxemic respiratory failure.  Chest film from 12./15 personally reviewed with bilateral interstitial infiltrates more predominant on the right base.   Dyspnea has improved, patient is on room air.    4. T2DM/ dyslipidemia  Patient has been tolerating po well, will check a metabolic pannel in am. Continue with statin therapy    5. HTN  On diltiazem, and irbesartan for blood pressure control.   6. Anxiety/ depression.  Continue with alprazolam and escitalopram     Status is: Inpatient  Remains inpatient appropriate because: Pending to be transfer to SNF    DVT prophylaxis:  Enoxaparin   Code Status:    full  Family Communication:  No family at the bedside       Consultants:  GI     Subjective: Patient with no diarrhea, no nausea or vomiting, continue to be very weak and deconditioned.  Lower extremity pain with ambulation   Objective: Vitals:   07/27/21 1300 07/27/21 1926 07/27/21 2255 07/28/21 0356  BP: (!) 121/54  126/77 138/76  Pulse:  76  77 79  Resp:   20 20  Temp: 98 F (36.7 C)  98.5 F (36.9 C) 99 F (37.2 C)  TempSrc: Oral  Oral Oral  SpO2: 98% 97% 93% 94%  Weight:      Height:        Intake/Output Summary (Last 24 hours) at 07/28/2021 0831 Last data filed at 07/28/2021 0600 Gross per 24 hour  Intake 480 ml  Output 1300 ml  Net -820 ml   Filed Weights   07/21/21 0300  Weight: 53.1 kg    Examination:   General: Not in  pain or dyspnea, deconditioned  Neurology: Awake and alert, non focal  E ENT: mild pallor, no icterus, oral mucosa moist Cardiovascular: No JVD. S1-S2 present, rhythmic, no gallops, rubs, or murmurs. No lower extremity edema. Pulmonary: positive breath sounds bilaterally, adequate air movement, no wheezing, rhonchi or rales. Gastrointestinal. Abdomen soft and non tender not distended Skin. No rashes Musculoskeletal: no joint deformities     Data Reviewed: I have personally reviewed following labs and imaging studies  CBC: Recent Labs  Lab 07/22/21 0354 07/23/21 0403 07/24/21 0400 07/25/21 0354 07/26/21 0412  WBC 17.6* 15.8* 14.2* 14.2* 11.6*  NEUTROABS 14.6* 13.2* 11.3* 10.6* 8.7*  HGB 10.6* 10.9* 10.2* 9.7* 9.3*  HCT 32.2* 33.4* 31.2* 30.9* 29.3*  MCV 92.8 92.5 94.3 97.8 96.4  PLT 295 303 327 317 308   Basic Metabolic Panel: Recent Labs  Lab 07/22/21 0354 07/23/21 0403 07/24/21 0400 07/25/21 0354 07/26/21 0412  NA 136 136 139 139 138  K 3.0* 3.4* 3.7 4.4 4.2  CL 101 104 105 104 99  CO2 25 24 27 27 31   GLUCOSE 143* 141* 140* 147* 104*  BUN 13 14 13 13 10   CREATININE 0.42* 0.45 0.43* 0.44 0.44  CALCIUM 7.5* 7.5* 7.8* 8.0* 8.1*  MG 1.9 2.1 1.8 1.9 1.8   GFR: Estimated Creatinine Clearance: 40.9 mL/min (by C-G formula based on SCr of 0.44 mg/dL). Liver Function Tests: No results for input(s): AST, ALT, ALKPHOS, BILITOT, PROT, ALBUMIN in the last 168 hours. No results for input(s): LIPASE, AMYLASE in the last 168 hours. No results for input(s): AMMONIA in the last 168 hours. Coagulation Profile: No results for input(s): INR, PROTIME in the last 168 hours. Cardiac Enzymes: No results for input(s): CKTOTAL, CKMB, CKMBINDEX, TROPONINI in the last 168 hours. BNP (last 3 results) No results for input(s): PROBNP in the last 8760 hours. HbA1C: No results for input(s): HGBA1C in the last 72 hours. CBG: No results for input(s): GLUCAP in the last 168 hours. Lipid  Profile: No results for input(s): CHOL, HDL, LDLCALC, TRIG, CHOLHDL, LDLDIRECT in the last 72 hours. Thyroid Function Tests: No results for input(s): TSH, T4TOTAL, FREET4, T3FREE, THYROIDAB in the last 72 hours. Anemia Panel: No results for input(s): VITAMINB12, FOLATE, FERRITIN, TIBC, IRON, RETICCTPCT in the last 72 hours.    Radiology Studies: I have reviewed all of the imaging during this hospital visit personally     Scheduled Meds:  diltiazem  240 mg Oral Daily   dorzolamide-timolol  1 drop Both Eyes BID   enoxaparin (LOVENOX) injection  40 mg Subcutaneous Q24H   escitalopram  20 mg Oral Daily   feeding supplement  237 mL Oral TID BM   fidaxomicin  200 mg Oral BID   fluticasone  2 spray Each Nare Daily   gabapentin  100 mg Oral TID   guaiFENesin  600 mg Oral BID   ipratropium-albuterol  3  mL Nebulization BID   irbesartan  300 mg Oral Daily   nystatin  5 mL Oral QID   Continuous Infusions:   LOS: 8 days        Zarion Oliff Gerome Apley, MD

## 2021-07-29 ENCOUNTER — Ambulatory Visit: Payer: Medicare Other | Admitting: Physician Assistant

## 2021-07-29 LAB — BASIC METABOLIC PANEL
Anion gap: 7 (ref 5–15)
BUN: 12 mg/dL (ref 8–23)
CO2: 31 mmol/L (ref 22–32)
Calcium: 8.6 mg/dL — ABNORMAL LOW (ref 8.9–10.3)
Chloride: 100 mmol/L (ref 98–111)
Creatinine, Ser: 0.58 mg/dL (ref 0.44–1.00)
GFR, Estimated: >60 mL/min (ref >60)
Glucose, Bld: 117 mg/dL — ABNORMAL HIGH (ref 70–99)
Potassium: 4 mmol/L (ref 3.5–5.1)
Sodium: 138 mmol/L (ref 135–145)

## 2021-07-29 LAB — CBC
HCT: 31.9 % — ABNORMAL LOW (ref 36.0–46.0)
Hemoglobin: 10.1 g/dL — ABNORMAL LOW (ref 12.0–15.0)
MCH: 30 pg (ref 26.0–34.0)
MCHC: 31.7 g/dL (ref 30.0–36.0)
MCV: 94.7 fL (ref 80.0–100.0)
Platelets: 437 10*3/uL — ABNORMAL HIGH (ref 150–400)
RBC: 3.37 MIL/uL — ABNORMAL LOW (ref 3.87–5.11)
RDW: 12.4 % (ref 11.5–15.5)
WBC: 8.8 10*3/uL (ref 4.0–10.5)
nRBC: 0 % (ref 0.0–0.2)

## 2021-07-29 IMAGING — CT CT ABD-PELV W/ CM
2 of 6 series · 15 of 46 positions shown, 17 images · IV contrast (APPLIED)
Comparison: 02/19/2017

CLINICAL DATA: Nausea vomiting. Abdominal pain over the last 10
days. Dizziness.

EXAM:
CT ABDOMEN AND PELVIS WITH CONTRAST
TECHNIQUE: Multidetector CT imaging of the abdomen and pelvis was performed
using the standard protocol following bolus administration of
intravenous contrast.
CONTRAST:  75mL OMNIPAQUE IOHEXOL 300 MG/ML  SOLN

[Series 2: axial st · axial · 0.68mm/px · z∈[-436,-76]mm · 12 of 86 slices shown, 14 images]
[im 7/86  soft-tissue]
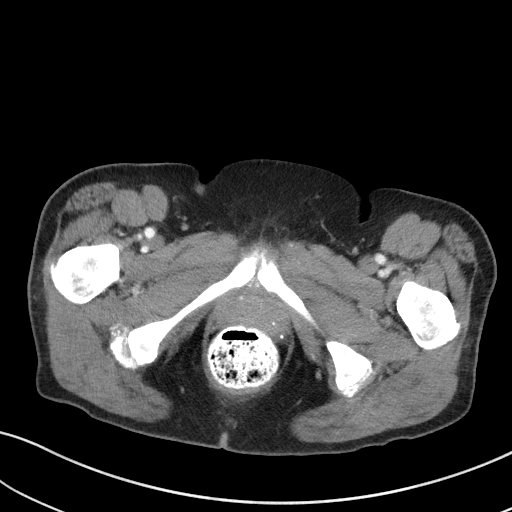
[im 7/86  bone]
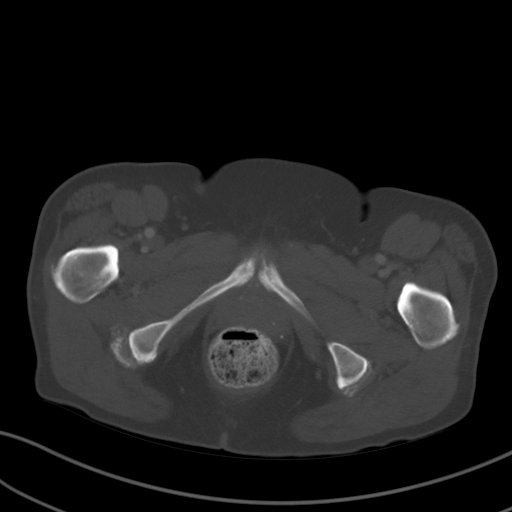
[im 14/86  soft-tissue]
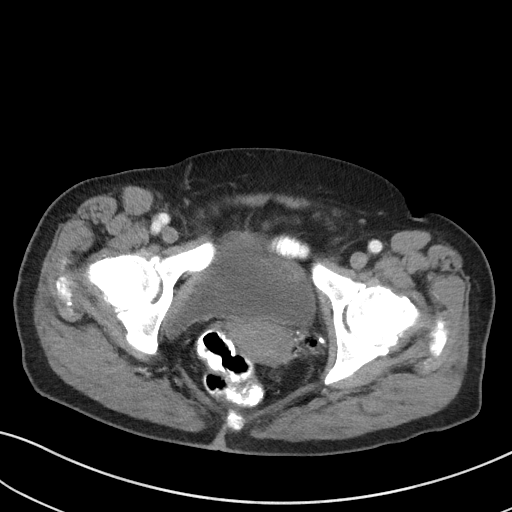
[im 20/86  soft-tissue]
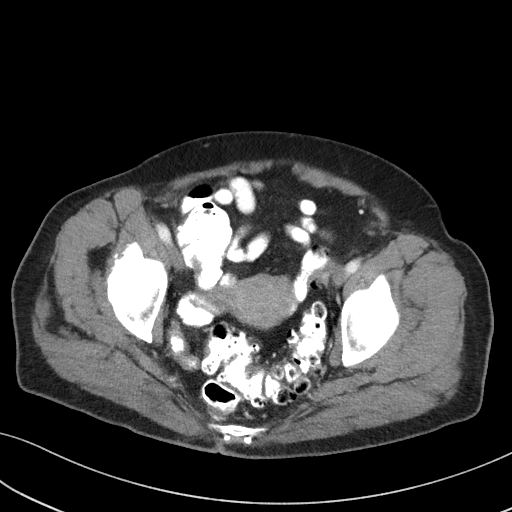
[im 27/86  soft-tissue]
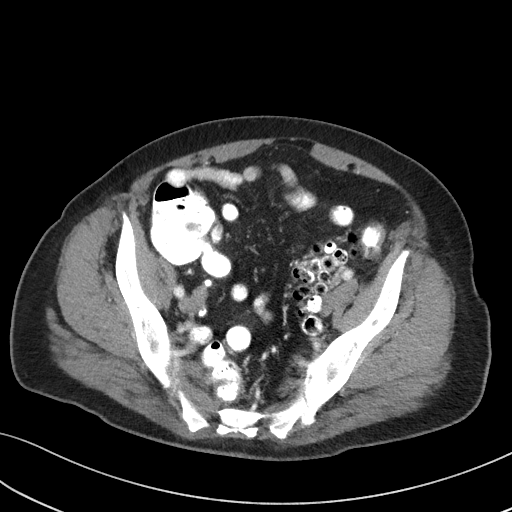
[im 33/86  soft-tissue]
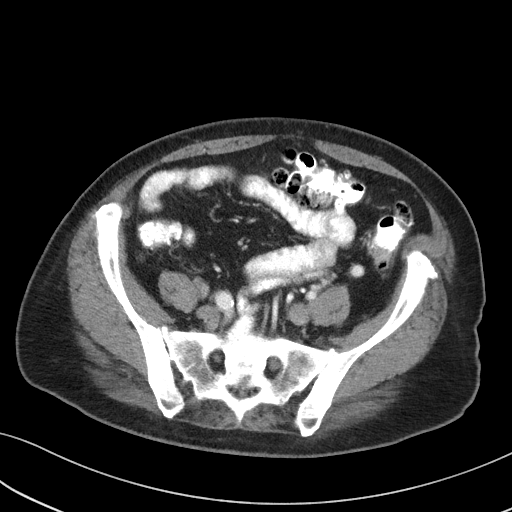
[im 40/86  soft-tissue]
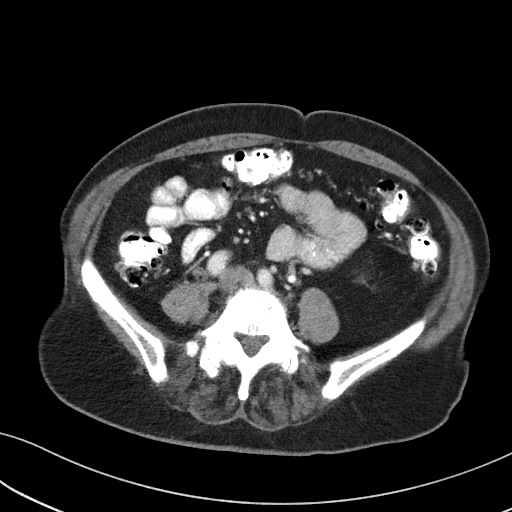
[im 46/86  soft-tissue]
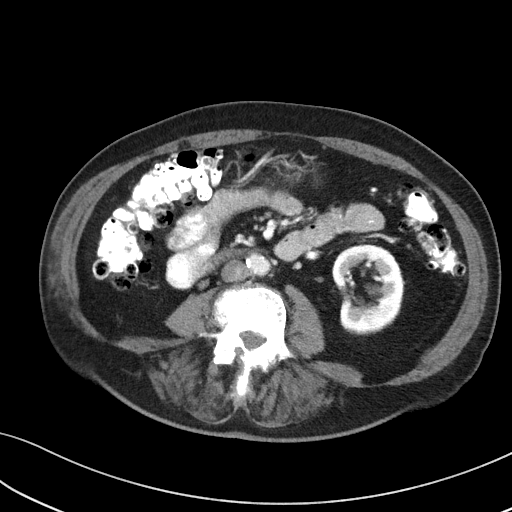
[im 53/86  soft-tissue]
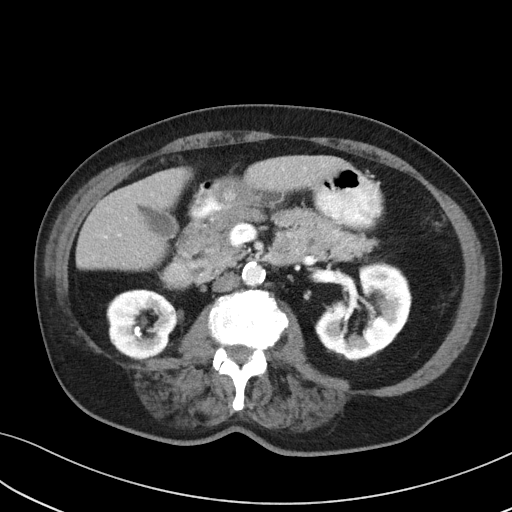
[im 59/86  soft-tissue]
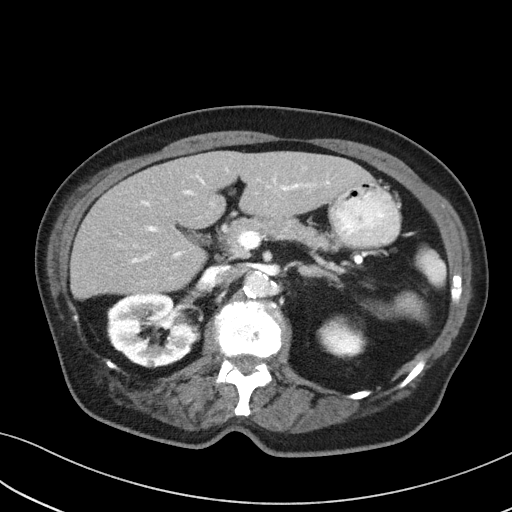
[im 59/86  bone]
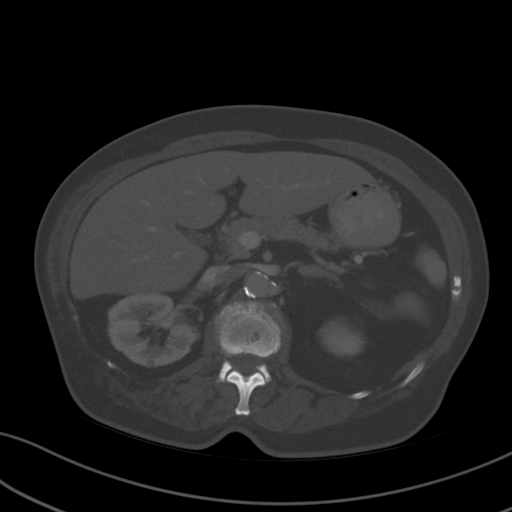
[im 66/86  soft-tissue]
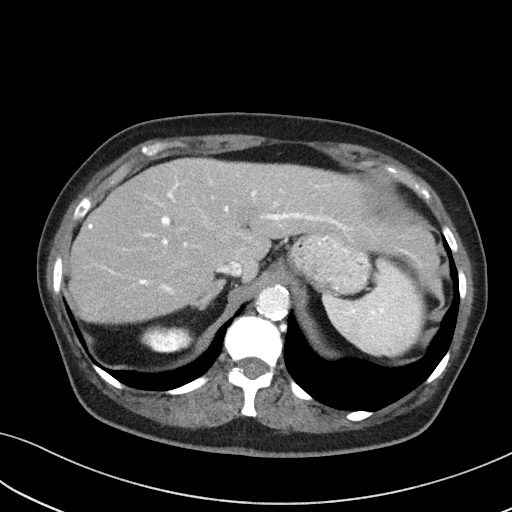
[im 72/86  soft-tissue]
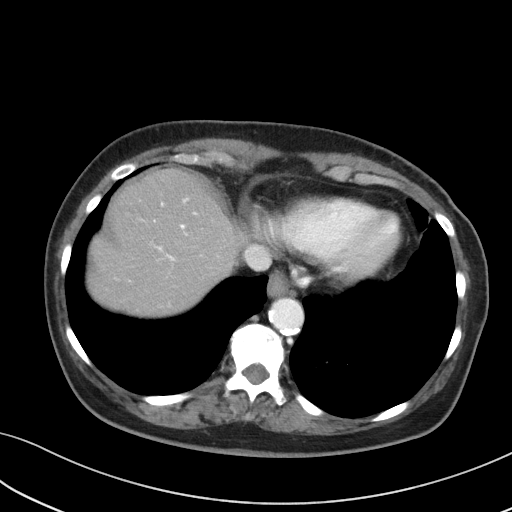
[im 79/86  soft-tissue]
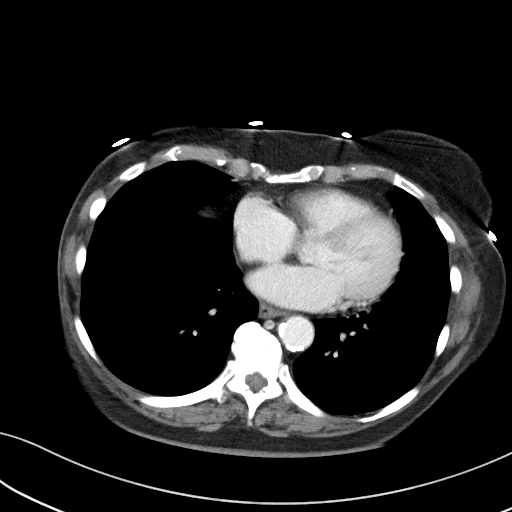

[Series 8: coronal st · coronal · 0.68mm/px · 3 of 80 slices shown]
[im 27/80  soft-tissue]
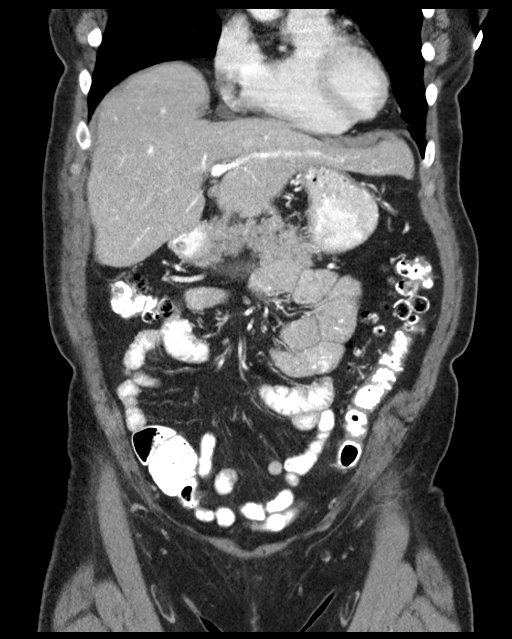
[im 36/80  soft-tissue]
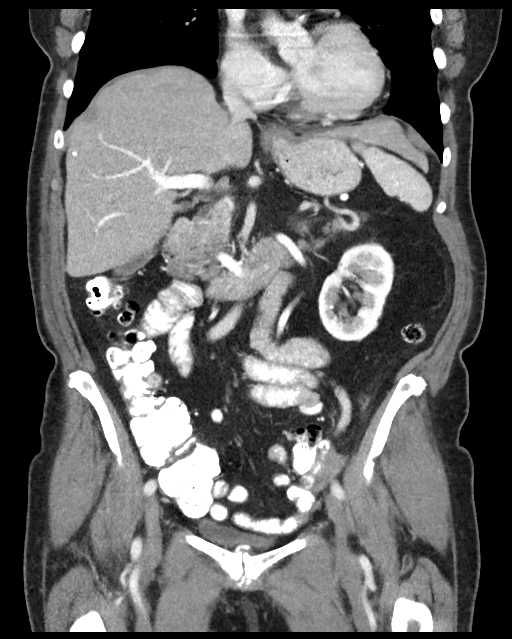
[im 44/80  soft-tissue]
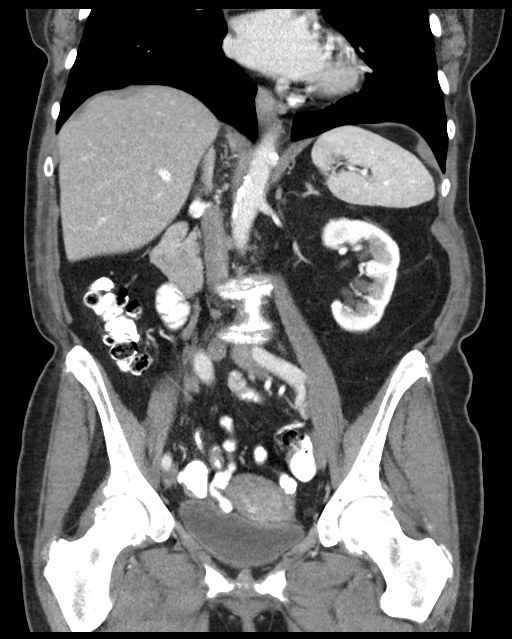

[15 of 46 positions shown; findings below may reference images not displayed]

FINDINGS: Lower chest: Descending thoracic aortic atherosclerotic
calcification. Small type 1 hiatal hernia.

Hepatobiliary: Small punctate calcifications in the parenchyma
compatible with old granulomatous disease. Gallbladder unremarkable.
No appreciable biliary dilatation.

Pancreas: Unremarkable

Spleen: Unremarkable

Adrenals/Urinary Tract: Fullness of the right adrenal gland without
overt mass.

Small hypodense lesions are present in both kidneys and are
statistically likely to be cysts but technically nonspecific.
Scarring in the right mid kidney anteriorly on image [DATE], stable.
No hydronephrosis or hydroureter. Calcifications are present
adjacent to the left distal ureter but are not thought to be in the
ureter.

Right kidney lower pole calculus 1.0 cm in long axis, image 35/2.
Right mid kidney calculus 0.3 cm in long axis, image 32/2. No
definite left renal calculi are identified.

Stomach/Bowel: Colonic diverticulosis, with mild acute
diverticulitis in the distal descending colon shown on images 76
through 80 of series 9. No significant extraluminal gas or abscess.

Vascular/Lymphatic: Aortoiliac atherosclerotic vascular disease.

Reproductive: Unremarkable

Other: No supplemental non-categorized findings.

Musculoskeletal: Low position of the anorectal junction compatible
with pelvic floor laxity.

Lumbar spondylosis and degenerative disc disease resulting in right
foraminal narrowing at L3-4, L4-5, and L5-S1; and left foraminal
narrowing at L3-4 and L5-S1.
IMPRESSION: 1. Mild acute diverticulitis in the distal descending colon. No
extraluminal gas or abscess.
2. Other imaging findings of potential clinical significance: Small
type 1 hiatal hernia. Small hypodense lesions in both kidneys are
statistically likely to be cysts but technically nonspecific due to
small size. Lumbar spondylosis and degenerative disc disease causing
multilevel impingement. Low position of the anorectal junction
compatible with pelvic floor laxity. Nonobstructive right
nephrolithiasis. Scarring in the right mid kidney anteriorly.

Aortic Atherosclerosis ([id number]).

## 2021-07-29 MED ORDER — COLCHICINE 0.6 MG PO TABS
0.6000 mg | ORAL_TABLET | Freq: Two times a day (BID) | ORAL | Status: AC
  Administered 2021-07-29 – 2021-07-30 (×3): 0.6 mg via ORAL
  Filled 2021-07-29 (×3): qty 1

## 2021-07-29 MED ORDER — PREDNISONE 20 MG PO TABS
20.0000 mg | ORAL_TABLET | Freq: Every day | ORAL | Status: DC
  Administered 2021-07-30 – 2021-07-31 (×2): 20 mg via ORAL
  Filled 2021-07-29 (×2): qty 1

## 2021-07-29 NOTE — Progress Notes (Signed)
Physical Therapy Treatment Patient Details Name: [name] MRN: [id number] DOB: 11/01/1940 Today's Date: 07/29/2021   History of Present Illness 80 yo female admitted with Cdiff, sepsis. Hx of DM, Cdiff, SVt, ACDF 2013, vertigo    PT Comments    Progressing with mobility.    Recommendations for follow up therapy are one component of a multi-disciplinary discharge planning process, led by the attending physician.  Recommendations may be updated based on patient status, additional functional criteria and insurance authorization.  Follow Up Recommendations  Skilled nursing-short term rehab (<3 hours/day)     Assistance Recommended at Discharge Frequent or constant Supervision/Assistance  Equipment Recommendations  Rolling walker (2 wheels)    Recommendations for Other Services       Precautions / Restrictions Precautions Precautions: Fall Precaution Comments: gouty pain? Restrictions Weight Bearing Restrictions: No     Mobility  Bed Mobility Overal bed mobility: Needs Assistance Bed Mobility: Supine to Sit;Sit to Supine     Supine to sit: Min guard;HOB elevated Sit to supine: Min guard;HOB elevated   General bed mobility comments: Min guard for safety. Relied on bedrail.    Transfers Overall transfer level: Needs assistance Equipment used: Rolling walker (2 wheels) Transfers: Sit to/from Stand Sit to Stand: Min assist           General transfer comment: Assist to power up, stabilize, control descent. Cues for safety, technique, hand placement.    Ambulation/Gait Ambulation/Gait assistance: Min assist Gait Distance (Feet): 40 Feet Assistive device: Rolling walker (2 wheels) Gait Pattern/deviations: Step-to pattern;Trunk flexed       General Gait Details: Assist to steady throughout distance. Intermittent assist to manage RW. Cues for safety, posture, RW proximity, step length(s). Slow gait speed.   Stairs             Wheelchair Mobility     Modified Rankin (Stroke Patients Only)       Balance Overall balance assessment: Needs assistance         Standing balance support: Bilateral upper extremity supported Standing balance-Leahy Scale: Poor                              Cognition Arousal/Alertness: Awake/alert Behavior During Therapy: WFL for tasks assessed/performed Overall Cognitive Status: Within Functional Limits for tasks assessed                                          Exercises General Exercises - Lower Extremity Ankle Circles/Pumps: AROM;Both;10 reps Long Arc Quad: AROM;Both;10 reps;Seated    General Comments        Pertinent Vitals/Pain Pain Assessment: Faces Faces Pain Scale: Hurts little more Pain Location: bil feet Pain Descriptors / Indicators: Discomfort;Sore Pain Intervention(s): Limited activity within patient's tolerance;Monitored during session;Repositioned    Home Living                          Prior Function            PT Goals (current goals can now be found in the care plan section) Progress towards PT goals: Progressing toward goals    Frequency    Min 3X/week      PT Plan Current plan remains appropriate    Co-evaluation              AM-PAC  PT "6 Clicks" Mobility   Outcome Measure  Help needed turning from your back to your side while in a flat bed without using bedrails?: A Little Help needed moving from lying on your back to sitting on the side of a flat bed without using bedrails?: A Little Help needed moving to and from a bed to a chair (including a wheelchair)?: A Little Help needed standing up from a chair using your arms (e.g., wheelchair or bedside chair)?: A Little Help needed to walk in hospital room?: A Little Help needed climbing 3-5 steps with a railing? : A Lot 6 Click Score: 17    End of Session Equipment Utilized During Treatment: Gait belt Activity Tolerance: Patient limited by fatigue;Patient  limited by pain Patient left: in bed;with call bell/phone within reach;with family/visitor present;with bed alarm set   PT Visit Diagnosis: Muscle weakness (generalized) (M62.81);Difficulty in walking, not elsewhere classified (R26.2);History of falling (Z91.81);Pain Pain - Right/Left:  (bil) Pain - part of body: Ankle and joints of foot     Time: 1350-1409 PT Time Calculation (min) (ACUTE ONLY): 19 min  Charges:  $Gait Training: 8-22 mins                         Doreatha Massed, PT Acute Rehabilitation  Office: [phone] Pager: [phone]

## 2021-07-29 NOTE — Progress Notes (Signed)
PROGRESS NOTE    [name]  XIH:[id number] DOB: 01-16-1941 DOA: 07/20/2021 PCP: [name], MD    Brief Narrative:  [name] was admitted to the hospital with the working diagnosis of diarrhea post C diff colitis.    80 yo female with the past medical history of GERD, hypertension, dyslipidemia, who presented with cough and diarrhea. She was diagnosed with C diff as outpatient after taking antibiotic therapy for 10 days (amoxicillin) for an upper respiratory tract infection. She took oral vancomycin for 10 days, but continue to have diarrhea. She developed general weakness that prompted her to come to the hospital. On her initial physical examination blood pressure 159/76, HR 106, rr 27 and oxygen saturation 94%. Lungs  with decreased breath sounds, heart with S1 and S2 present, abdomen soft and non tender, no lower extremity edema.    Sodium 137 potassium 2.7 chloride 99, bicarb 29, glucose 132, BUN 6, creatinine 0.53, white count 14.1, globin 11.6, hematocrit 35.2, platelets 265 SARS COVID-19 negative.   Urinalysis specific gravity 1.011 Stool negative for C. Difficile   CT abdomen with diffuse colonic diverticulosis without diverticulitis no inflammatory changes.  Bilateral nonobstructing renal calculi, largest in the right kidney measuring 10 mm   131 bpm, left axis deviation, normal intervals, sinus rhythm with PVC, no significant st segment or t wave changes    Patient has been placed on fidoxamicin, because high pretest probability for persistent C diff.     12/15 chest film with increase interstitial infiltrates more on the right lower lobe, consistent with acute pulmonary edema (non cardiogenic).    Patient medically stable to be transfer to SNF Has right lower extremity pain with difficult ambulation.     Assessment & Plan:   Principal Problem:   C. difficile diarrhea Active Problems:   Hyperlipidemia associated with type 2 diabetes mellitus ([place])    Hypertension associated with diabetes ([place])   GERD (gastroesophageal reflux disease)   Diabetes mellitus without complication (HCC)   Sepsis ([place])   CKD (chronic kidney disease) stage 2, GFR 60-89 ml/min   Hypokalemia     C diff diarrhea  On fidaxomicin to complete 10 days.  Discontinue enteric precautions, patient with no more diarrhea.   2. Hypokalemia due to GI losses/ CKD stage 2.  Renal function with serum cr at 0,58 K is 4,0 and serum bicarbonate at 31.   3. Non cardiogenic pulmonary edema with acute hypoxemic respiratory failure.  Chest film from 12./15 personally reviewed with bilateral interstitial infiltrates more predominant on the right base.  Pulmonary edema has resolved.     4. T2DM/ dyslipidemia  Fasting glucose is 117, patient with poor oral intake, continue to hold on insulin therapy.    5. HTN  Continue blood pressure control with diltiazem, and irbesartan   6. Anxiety/ depression.  On alprazolam and escitalopram with good toleration.   7. Acute feet arthritis. Suspected gout. Patient has inflammatory changes on right foot 2nd toe and left foot base of 5th metatarsal Tender to palpation and positive erythema.  No pus or open wound.   I suspect gout flare.  Will add cholchicne and short course of steroids Check uric acid level in am.   Status is: Inpatient  Remains inpatient appropriate because: pending transfer to SNF   DVT prophylaxis: Enoxaparin   Code Status:    full  Family Communication:  I spoke with patient's family at the bedside, we talked in detail about patient's condition, plan of care  and prognosis and all questions were addressed. I spoke over the phone with the patient's daughter about patient's  condition, plan of care, prognosis and all questions were addressed.    Consultants:  GI   Antimicrobials:  Fidaxomicin     Subjective: Patient is feeling better, but continue to be very weak and deconditioned. Has bilateral feet pain  that makes it difficult for her to ambulate, no further diarrhea,   Objective: Vitals:   07/28/21 0920 07/28/21 0923 07/28/21 1940 07/29/21 0613  BP:   129/61 (!) 154/71  Pulse:   87 88  Resp:   18 18  Temp:      TempSrc:      SpO2: 94% 94% 97% 95%  Weight:      Height:        Intake/Output Summary (Last 24 hours) at 07/29/2021 1154 Last data filed at 07/28/2021 1911 Gross per 24 hour  Intake 240 ml  Output 400 ml  Net -160 ml   Filed Weights   07/21/21 0300  Weight: 53.1 kg    Examination:   General: Not in pain or dyspnea, deconditioned  Neurology: Awake and alert, non focal  E ENT: no pallor, no icterus, oral mucosa moist Cardiovascular: No JVD. S1-S2 present, rhythmic, no gallops, rubs, or murmurs. No lower extremity edema. Pulmonary: vesicular breath sounds bilaterally, adequate air movement, no wheezing, rhonchi or rales. Gastrointestinal. Abdomen soft and non tender Skin. No rashes Musculoskeletal: left foot 2nd toe with erythema and very tender to palpation, right foot base of 5th metatarsal erythema and tender to palpation.    Data Reviewed: I have personally reviewed following labs and imaging studies  CBC: Recent Labs  Lab 07/23/21 0403 07/24/21 0400 07/25/21 0354 07/26/21 0412 07/29/21 0344  WBC 15.8* 14.2* 14.2* 11.6* 8.8  NEUTROABS 13.2* 11.3* 10.6* 8.7*  --   HGB 10.9* 10.2* 9.7* 9.3* 10.1*  HCT 33.4* 31.2* 30.9* 29.3* 31.9*  MCV 92.5 94.3 97.8 96.4 94.7  PLT 303 327 317 301 767*   Basic Metabolic Panel: Recent Labs  Lab 07/23/21 0403 07/24/21 0400 07/25/21 0354 07/26/21 0412 07/29/21 0344  NA 136 139 139 138 138  K 3.4* 3.7 4.4 4.2 4.0  CL 104 105 104 99 100  CO2 24 27 27 31 31   GLUCOSE 141* 140* 147* 104* 117*  BUN 14 13 13 10 12   CREATININE 0.45 0.43* 0.44 0.44 0.58  CALCIUM 7.5* 7.8* 8.0* 8.1* 8.6*  MG 2.1 1.8 1.9 1.8  --    GFR: Estimated Creatinine Clearance: 40.9 mL/min (by C-G formula based on SCr of 0.58 mg/dL). Liver  Function Tests: No results for input(s): AST, ALT, ALKPHOS, BILITOT, PROT, ALBUMIN in the last 168 hours. No results for input(s): LIPASE, AMYLASE in the last 168 hours. No results for input(s): AMMONIA in the last 168 hours. Coagulation Profile: No results for input(s): INR, PROTIME in the last 168 hours. Cardiac Enzymes: No results for input(s): CKTOTAL, CKMB, CKMBINDEX, TROPONINI in the last 168 hours. BNP (last 3 results) No results for input(s): PROBNP in the last 8760 hours. HbA1C: No results for input(s): HGBA1C in the last 72 hours. CBG: No results for input(s): GLUCAP in the last 168 hours. Lipid Profile: No results for input(s): CHOL, HDL, LDLCALC, TRIG, CHOLHDL, LDLDIRECT in the last 72 hours. Thyroid Function Tests: No results for input(s): TSH, T4TOTAL, FREET4, T3FREE, THYROIDAB in the last 72 hours. Anemia Panel: No results for input(s): VITAMINB12, FOLATE, FERRITIN, TIBC, IRON, RETICCTPCT in the last 72  hours.    Radiology Studies: I have reviewed all of the imaging during this hospital visit personally     Scheduled Meds:  diltiazem  240 mg Oral Daily   dorzolamide-timolol  1 drop Both Eyes BID   enoxaparin (LOVENOX) injection  40 mg Subcutaneous Q24H   escitalopram  20 mg Oral Daily   feeding supplement  237 mL Oral TID BM   fidaxomicin  200 mg Oral BID   fluticasone  2 spray Each Nare Daily   gabapentin  100 mg Oral TID   guaiFENesin  600 mg Oral BID   irbesartan  300 mg Oral Daily   nystatin  5 mL Oral QID   Continuous Infusions:   LOS: 9 days        Moira Umholtz Gerome Apley, MD

## 2021-07-30 LAB — RESP PANEL BY RT-PCR (FLU A&B, COVID) ARPGX2
Influenza A by PCR: NEGATIVE
Influenza B by PCR: NEGATIVE
SARS Coronavirus 2 by RT PCR: NEGATIVE

## 2021-07-30 LAB — URIC ACID: Uric Acid, Serum: 3.4 mg/dL (ref 2.5–7.1)

## 2021-07-30 MED ORDER — COLCHICINE 0.6 MG PO TABS
0.6000 mg | ORAL_TABLET | Freq: Every day | ORAL | Status: DC
  Administered 2021-07-30: 17:00:00 0.6 mg via ORAL
  Filled 2021-07-30: qty 1

## 2021-07-30 NOTE — Progress Notes (Signed)
PROGRESS NOTE    [name]  WCH:[id number] DOB: Apr 18, 1941 DOA: 07/20/2021 PCP: [name], MD    Brief Narrative:  [name] was admitted to the hospital with the working diagnosis of diarrhea post C diff colitis.  Prolonged hospitalization, bilateral feet gout flare    80 yo female with the past medical history of GERD, hypertension, dyslipidemia, who presented with cough and diarrhea. She was diagnosed with C diff as outpatient after taking antibiotic therapy for 10 days (amoxicillin) for an upper respiratory tract infection. She took oral vancomycin for 10 days, but continue to have diarrhea. She developed general weakness that prompted her to come to the hospital. On her initial physical examination blood pressure 159/76, HR 106, rr 27 and oxygen saturation 94%. Lungs  with decreased breath sounds, heart with S1 and S2 present, abdomen soft and non tender, no lower extremity edema.    Sodium 137 potassium 2.7 chloride 99, bicarb 29, glucose 132, BUN 6, creatinine 0.53, white count 14.1, globin 11.6, hematocrit 35.2, platelets 265 SARS COVID-19 negative.   Urinalysis specific gravity 1.011 Stool negative for C. Difficile   CT abdomen with diffuse colonic diverticulosis without diverticulitis no inflammatory changes.  Bilateral nonobstructing renal calculi, largest in the right kidney measuring 10 mm   131 bpm, left axis deviation, normal intervals, sinus rhythm with PVC, no significant st segment or t wave changes    Patient has been placed on fidoxamicin, because high pretest probability for persistent C diff.     12/15 chest film with increase interstitial infiltrates more on the right lower lobe, consistent with acute pulmonary edema (non cardiogenic).    Patient medically stable to be transfer to SNF Has right lower extremity pain with difficult ambulation due to bilateral feet gout flare.    Assessment & Plan:   Principal Problem:   C. difficile  diarrhea Active Problems:   Hyperlipidemia associated with type 2 diabetes mellitus ([place])   Hypertension associated with diabetes ([place])   GERD (gastroesophageal reflux disease)   Diabetes mellitus without complication (HCC)   Sepsis ([place])   CKD (chronic kidney disease) stage 2, GFR 60-89 ml/min   Hypokalemia   C diff diarrhea  No completed fidaxomicin (10 days).  Diarrhea has resolved and patient is tolerating well po.    2. Hypokalemia due to GI losses/ CKD stage 2.  Patient is tolerating well po, no blood work for today    3. Non cardiogenic pulmonary edema with acute hypoxemic respiratory failure.  Chest film from 12./15 personally reviewed with bilateral interstitial infiltrates more predominant on the right base.   Pulmonary edema has resolved.     4. T2DM/ dyslipidemia  Continue to follow up capillary glucose as needed.    5. HTN  On diltiazem, and irbesartan   6. Anxiety/ depression.  Continue with alprazolam and escitalopram with good toleration.    7. Acute feet arthritis. Suspected gout. Patient has inflammatory changes on right foot 2nd toe and left foot base of 5th metatarsal Tender to palpation and positive erythema.  No pus or open wound.    Improved after colchicine and steroids, more on the right foot, left side continue to be signs of inflammation, but able to ambulate better.   Status is: Inpatient  Remains inpatient appropriate because: pending transfer to SNF    DVT prophylaxis: Enoxaparin   Code Status:    full  Family Communication:  I spoke with patient's daughter at the bedside, we talked in detail about  patient's condition, plan of care and prognosis and all questions were addressed.    Consultants:  GI     Subjective: Patient is feeling better, improved ambulation and improved bilateral feet pain, more the left than the right. No diarrhea, improved strength but not back to baseline   Objective: Vitals:   07/29/21 0613 07/29/21 1248  07/29/21 2009 07/30/21 1253  BP: (!) 154/71 (!) 165/81 133/61 125/66  Pulse: 88 89 83 79  Resp: 18 16 18 20   Temp:  98 F (36.7 C) 98.8 F (37.1 C) 98 F (36.7 C)  TempSrc:  Oral  Oral  SpO2: 95% 97% 97% 96%  Weight:      Height:        Intake/Output Summary (Last 24 hours) at 07/30/2021 1315 Last data filed at 07/29/2021 2100 Gross per 24 hour  Intake 240 ml  Output 250 ml  Net -10 ml   Filed Weights   07/21/21 0300  Weight: 53.1 kg    Examination:   General: Not in pain or dyspnea , deconditioned  Neurology: Awake and alert, non focal  E ENT: no pallor, no icterus, oral mucosa moist Cardiovascular: No JVD. S1-S2 present, rhythmic, no gallops, rubs, or murmurs. No lower extremity edema. Pulmonary: vesicular breath sounds bilaterally, adequate air movement, no wheezing, rhonchi or rales. Gastrointestinal. Abdomen soft and non tender Skin. No rashes Musculoskeletal:  improved erythema at the left foot 2nd toe continue to have erythema and tenderness at the base of the 5th digit.      Data Reviewed: I have personally reviewed following labs and imaging studies  CBC: Recent Labs  Lab 07/24/21 0400 07/25/21 0354 07/26/21 0412 07/29/21 0344  WBC 14.2* 14.2* 11.6* 8.8  NEUTROABS 11.3* 10.6* 8.7*  --   HGB 10.2* 9.7* 9.3* 10.1*  HCT 31.2* 30.9* 29.3* 31.9*  MCV 94.3 97.8 96.4 94.7  PLT 327 317 301 474*   Basic Metabolic Panel: Recent Labs  Lab 07/24/21 0400 07/25/21 0354 07/26/21 0412 07/29/21 0344  NA 139 139 138 138  K 3.7 4.4 4.2 4.0  CL 105 104 99 100  CO2 27 27 31 31   GLUCOSE 140* 147* 104* 117*  BUN 13 13 10 12   CREATININE 0.43* 0.44 0.44 0.58  CALCIUM 7.8* 8.0* 8.1* 8.6*  MG 1.8 1.9 1.8  --    GFR: Estimated Creatinine Clearance: 40.9 mL/min (by C-G formula based on SCr of 0.58 mg/dL). Liver Function Tests: No results for input(s): AST, ALT, ALKPHOS, BILITOT, PROT, ALBUMIN in the last 168 hours. No results for input(s): LIPASE, AMYLASE in  the last 168 hours. No results for input(s): AMMONIA in the last 168 hours. Coagulation Profile: No results for input(s): INR, PROTIME in the last 168 hours. Cardiac Enzymes: No results for input(s): CKTOTAL, CKMB, CKMBINDEX, TROPONINI in the last 168 hours. BNP (last 3 results) No results for input(s): PROBNP in the last 8760 hours. HbA1C: No results for input(s): HGBA1C in the last 72 hours. CBG: No results for input(s): GLUCAP in the last 168 hours. Lipid Profile: No results for input(s): CHOL, HDL, LDLCALC, TRIG, CHOLHDL, LDLDIRECT in the last 72 hours. Thyroid Function Tests: No results for input(s): TSH, T4TOTAL, FREET4, T3FREE, THYROIDAB in the last 72 hours. Anemia Panel: No results for input(s): VITAMINB12, FOLATE, FERRITIN, TIBC, IRON, RETICCTPCT in the last 72 hours.    Radiology Studies: I have reviewed all of the imaging during this hospital visit personally     Scheduled Meds:  diltiazem  240 mg  Oral Daily   dorzolamide-timolol  1 drop Both Eyes BID   enoxaparin (LOVENOX) injection  40 mg Subcutaneous Q24H   escitalopram  20 mg Oral Daily   feeding supplement  237 mL Oral TID BM   fluticasone  2 spray Each Nare Daily   gabapentin  100 mg Oral TID   guaiFENesin  600 mg Oral BID   irbesartan  300 mg Oral Daily   nystatin  5 mL Oral QID   predniSONE  20 mg Oral Q breakfast   Continuous Infusions:   LOS: 10 days        Calli Bashor Gerome Apley, MD

## 2021-07-30 NOTE — TOC Progression Note (Signed)
Transition of Care [facility]) - Progression Note    Patient Details  Name: [name] MRN: [id number] Date of Birth: 08/06/1941  [facility] CM/SW Contact  Purcell Mouton, RN Phone Number: 07/30/2021, 3:07 PM  Clinical Narrative:    Pt and daughter selected [facility]. A call was made for bed. Waiting on Tiburcio Bash to approve.   Expected Discharge Plan: [place] Barriers to Discharge: No Barriers Identified  Expected Discharge Plan and Services Expected Discharge Plan: [place] arrangements for the past 2 months: [place]                                       Social Determinants of Health (SDOH) Interventions    Readmission Risk Interventions No flowsheet data found.

## 2021-07-30 NOTE — Plan of Care (Signed)
°  Problem: Education: Goal: Knowledge of General Education information will improve Description: Including pain rating scale, medication(s)/side effects and non-pharmacologic comfort measures Outcome: Progressing   Problem: Education: Goal: Knowledge of General Education information will improve Description: Including pain rating scale, medication(s)/side effects and non-pharmacologic comfort measures Outcome: Progressing   Problem: Coping: Goal: Level of anxiety will decrease Outcome: Progressing   Problem: Elimination: Goal: Will not experience complications related to urinary retention Outcome: Progressing   Problem: Pain Managment: Goal: General experience of comfort will improve Outcome: Progressing   Problem: Safety: Goal: Ability to remain free from injury will improve Outcome: Progressing   Problem: Skin Integrity: Goal: Risk for impaired skin integrity will decrease Outcome: Progressing

## 2021-07-30 NOTE — Care Management Important Message (Signed)
Important Message  Patient Details IM Letter given to the Patient. Name: [name] MRN: [id number] Date of Birth: 1941-05-22   Medicare Important Message Given:  Yes     Kerin Salen 07/30/2021, 11:31 AM

## 2021-07-31 MED ORDER — COLCHICINE 0.3 MG HALF TABLET: 0.3000 mg | ORAL_TABLET | Freq: Every day | ORAL | Status: DC

## 2021-07-31 MED ORDER — TRAMADOL HCL 50 MG PO TABS
50.0000 mg | ORAL_TABLET | Freq: Four times a day (QID) | ORAL | Status: DC | PRN
  Administered 2021-07-31: 11:00:00 50 mg via ORAL
  Filled 2021-07-31: qty 1

## 2021-07-31 MED ORDER — ALPRAZOLAM 0.25 MG PO TABS
0.2500 mg | ORAL_TABLET | Freq: Every day | ORAL | 0 refills | Status: DC | PRN
Start: 2021-07-31 — End: 2021-10-10

## 2021-07-31 MED ORDER — TRAMADOL HCL 50 MG PO TABS: 50.0000 mg | ORAL_TABLET | Freq: Four times a day (QID) | ORAL | 0 refills | Status: DC | PRN

## 2021-07-31 MED ORDER — COLCHICINE 0.6 MG PO TABS
0.3000 mg | ORAL_TABLET | Freq: Two times a day (BID) | ORAL | 0 refills | Status: DC
Start: 2021-07-31 — End: 2021-07-31

## 2021-07-31 MED ORDER — ENSURE ENLIVE PO LIQD: 237.0000 mL | Freq: Three times a day (TID) | ORAL | 0 refills | Status: DC

## 2021-07-31 MED ORDER — PREDNISONE 20 MG PO TABS: 20.0000 mg | ORAL_TABLET | Freq: Every day | ORAL | 0 refills | Status: AC

## 2021-07-31 MED ORDER — COLCHICINE 0.3 MG HALF TABLET
0.3000 mg | ORAL_TABLET | Freq: Two times a day (BID) | ORAL | Status: DC
  Administered 2021-07-31: 11:00:00 0.3 mg via ORAL
  Filled 2021-07-31: qty 1

## 2021-07-31 MED ORDER — COLCHICINE 0.6 MG PO TABS: 0.3000 mg | ORAL_TABLET | Freq: Two times a day (BID) | ORAL | 0 refills | Status: DC

## 2021-07-31 NOTE — Progress Notes (Signed)
Phone [phone] In person visit   Subjective:   [name] is a 80 y.o. year old very pleasant female patient who presents for/with See problem oriented charting Chief Complaint  Patient presents with   Follow-up    Pt has been in hospital with c-diff and she has questions regarding her medicines and kidney disease.   Hypertension   Diabetes   This visit occurred during the SARS-CoV-2 public health emergency.  Safety protocols were in place, including screening questions prior to the visit, additional usage of staff PPE, and extensive cleaning of exam room while observing appropriate contact time as indicated for disinfecting solutions.   Past Medical History-  Patient Active Problem List   Diagnosis Date Noted   Diabetes mellitus without complication ([place]) 40/81/4481    Priority: High   History of supraventricular tachycardia 08/14/2011    Priority: High   Osteopenia of left femoral neck 08/16/2021    Priority: Medium    Aortic atherosclerosis ([place]) 07/21/2019    Priority: Medium    GERD (gastroesophageal reflux disease) 07/14/2014    Priority: Medium    Primary open angle glaucoma of both eyes, mild stage 05/29/2014    Priority: Medium    IBS (irritable bowel syndrome)- diarrhea predominant 01/26/2013    Priority: Medium    Glaucoma associated with ocular disorder 11/05/2009    Priority: Medium    Hyperlipidemia associated with type 2 diabetes mellitus ([place]) 09/22/2007    Priority: Medium    Major depression in full remission ([place])- Lexapro 20mg . Xanax once a week or less. 03/11/2007    Priority: Medium    Hypertension associated with diabetes ([place]) 03/11/2007    Priority: Medium    Sensorineural hearing loss (SNHL), bilateral 04/06/2020    Priority: Low   History of colonic polyps 11/28/2011    Priority: Low   Senile nuclear sclerosis 06/29/2011    Priority: Low   Cervical disc disorder with radiculopathy of cervical region 11/05/2009    Priority: Low    Rosacea 03/11/2007    Priority: Low   SVT (supraventricular tachycardia) ([place]) 08/16/2021   Hypokalemia 07/21/2021   C. difficile diarrhea 07/20/2021   Vertigo 04/06/2020   Knee pain, left 11/02/2013    Medications- reviewed and updated Current Outpatient Medications  Medication Sig Dispense Refill   acetaminophen (TYLENOL) 325 MG tablet Take 650 mg by mouth every 6 (six) hours as needed for moderate pain.     albuterol (VENTOLIN HFA) 108 (90 Base) MCG/ACT inhaler Inhale 2 puffs into the lungs every 6 (six) hours as needed for wheezing or shortness of breath (or cough). 1 each 0   ALPRAZolam (XANAX) 0.25 MG tablet Take 1 tablet (0.25 mg total) by mouth daily as needed for anxiety. 10 tablet 0   Calcium Carbonate-Vitamin D (CALCIUM + D PO) Take 1 tablet by mouth daily.     diltiazem (CARDIZEM CD) 240 MG 24 hr capsule TAKE 1 CAPSULE BY MOUTH EVERY DAY (Patient taking differently: 240 mg daily.) 90 capsule 3   dorzolamide-timolol (COSOPT) 22.3-6.8 MG/ML ophthalmic solution Place 1 drop into both eyes 2 (two) times daily.     escitalopram (LEXAPRO) 20 MG tablet TAKE 1 TABLET BY MOUTH EVERY DAY (Patient taking differently: Take 20 mg by mouth daily.) 90 tablet 3   fluticasone (FLONASE) 50 MCG/ACT nasal spray SPRAY 2 SPRAYS INTO EACH NOSTRIL EVERY DAY 16 mL 0   rosuvastatin (CRESTOR) 10 MG tablet Take 1 tablet (10 mg total) by mouth once a week.  13 tablet 3   valsartan (DIOVAN) 320 MG tablet TAKE 1 TABLET BY MOUTH EVERY DAY 90 tablet 3   colchicine 0.6 MG tablet Take 0.5 tablets (0.3 mg total) by mouth 2 (two) times daily for 3 days. 3 tablet 0   No current facility-administered medications for this visit.     Objective:  BP 130/62    Pulse 66    Temp (!) 97.1 F (36.2 C)    Ht 5\' 1"  (1.549 m)    Wt 108 lb 3.2 oz (49.1 kg)    LMP 08/12/1991    SpO2 100%    BMI 20.44 kg/m  Gen: NAD, resting comfortably CV: RRR no murmurs rubs or gallops Lungs: CTAB no crackles, wheeze, rhonchi Abdomen:  soft/nontender/nondistended/normal bowel sounds.  Ext: no edema Skin: warm, dry     Assessment and Plan   #Hospital follow-up for C. difficile S: Patient was hospitalized from 07/20/2021 to 07/31/2021.  She was discharged to SNF.  She was treated for C. difficile colitis and had prolonged hospitalization which also included gout flares.  She was treated with  fidoxamicin x10 days (was told to avoid pantoprazole in the future).  She also developed noncardiogenic pulmonary edema in the hospital but this improved without diuresis.  She dealt with some electrolyte abnormalities in the hospital and needs to have these rechecked  Unfortunately patient called in with recurrent diarrhea concern on 08/01/2021 from the SNF  She was also seen yesterday for left thigh pain for 1 month-slight corneal abnormality noted-thought possibly herpetic-was referred to Dr. Susa Simmonds for eval/tx-scheduled on 1123.  She was prescribed erythromycin topically.  No more diarrhea. Got out about 1.5 weeks ago.  A/P:  thankfully seems to have resolved- need to monitor CBC and CMP today at this point. Has GI follow up in 6 days- hoping will not have further recurrence of C. Diff -will be working with PT at house to regain strength   # Diabetes S: Diet controlled. New diagnosis in 2020 after 2 out of 3 last A1c was over 6.5. Lab Results  Component Value Date   HGBA1C 6.1 (H) 07/21/2021   HGBA1C 6.6 (H) 02/06/2021   HGBA1C 6.5 (H) 07/25/2020   A/P:  stable/diet controlled on recent a1c stay off meds  #hyperlipidemia associated with diabetes/aortic atherosclerosis- incidental finding on prior imaging  S:  trial 10mg  atorvastatin once a week 07/21/2019 later titrated to 20 mg twice a week- she did not do well with this was getting muscle aches when on three times a week Lab Results  Component Value Date   CHOL 184 04/18/2021   HDL 33.60 (L) 04/18/2021   LDLCALC 132 (H) 07/25/2020   LDLDIRECT 95.0 04/18/2021   TRIG  326.0 (H) 04/18/2021   CHOLHDL 5 04/18/2021   A/P: Cholesterol has been poorly controlled-she did not tolerate 3 times a week atorvastatin 20 mg-we opted to try rosuvastatin 10 mg once a week and we can recheck cholesterol at next visit when back on medication  #hypertension/history of SVT. #history of hypokalemia with C. diff S: compliant with Cardizem 240mg  every day and Diovan 320mg  every day -Currently off hydrochlorothiazideafter recent hospitalization and low potassium No obvious recurrence of SVT on Cardizem. BP Readings from Last 3 Encounters:  08/16/21 130/62  07/31/21 136/67  07/16/21 122/80  A/P: Controlled BP. Continue current medications.   SVT without recurrence- contiue diltiazem in case recurrence  #Depression/anxiety- lost son to MI 2019 and daughter to pancreatic cancer 2020 and another son to  MI 2021 then granddaughter lost husband in [facility] 2021 S: Patient compliant with Lexapro 20 mg every day. Patient took sparing alprazolam- not needing much- took one on Monday morning and half last night Depression screen [facility] 2/9 07/16/2021 02/06/2021 07/25/2020  Decreased Interest 0 0 0  Down, Depressed, Hopeless 0 1 1  PHQ - 2 Score 0 1 1  Altered sleeping 0 0 1  Tired, decreased energy 1 0 1  Change in appetite 1 0 0  Feeling bad or failure about yourself  0 1 0  Trouble concentrating 0 0 0  Moving slowly or fidgety/restless 0 0 0  Suicidal thoughts 0 0 0  PHQ-9 Score 2 2 3   Difficult doing work/chores - Not difficult at all Not difficult at all  Some recent data might be hidden  A/P: Reasonable control/would consider full remission-continue current medication  #Gout flare in her feet-uric acid was not elevated with acute flare.  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 Future Appointments  Date Time Provider [place]  08/22/2021  2:00 PM Willia Craze, NP [facility]    Lab/Order associations:   ICD-10-CM   1. C. difficile colitis  A04.72     2. Hyperlipidemia associated with type 2 diabetes mellitus (HCC)  E11.69 CBC with Differential/Platelet   E78.5 Comprehensive metabolic panel    TSH    3. Diabetes mellitus without complication (HCC)  G99.2 CBC with Differential/Platelet    Comprehensive metabolic panel    4. Major depressive disorder with single episode, in full remission ([place])  F32.5     5. Aortic atherosclerosis (H[facility])  I70.0     6. Gastroesophageal reflux disease without esophagitis  K21.9     7. Hypertension associated with diabetes ([place])  E11.59    I15.2     8. Osteopenia of left femoral neck  M85.852     9. SVT (supraventricular tachycardia) (H[facility])  I47.1       Meds ordered this encounter  Medications   rosuvastatin (CRESTOR) 10 MG tablet    Sig: Take 1 tablet (10 mg total) by mouth once a week.    Dispense:  13 tablet    Refill:  3    I,Jada Bradford,acting as a scribe for Garret Reddish, MD.,have documented all relevant documentation on the behalf of Garret Reddish, MD,as directed by  Garret Reddish, MD while in the presence of Garret Reddish, MD.  I, Garret Reddish, MD, have reviewed all documentation for this visit. The documentation on 08/16/21 for the exam,  diagnosis, procedures, and orders are all accurate and complete.  Return precautions advised.  Garret Reddish, MD

## 2021-07-31 NOTE — Patient Instructions (Addendum)
Cholesterol has been poorly controlled-she did not tolerate 3 times a week atorvastatin 20 mg-we opted to try rosuvastatin 10 mg once a week (start after you stop gout medicine colchicine) and we can recheck cholesterol at next visit when back on medication  Please stop by lab before you go If you have mychart- we will send your results within 3 business days of [place] receiving them.  If you do not have mychart- we will call you about results within 5 business days of [place] receiving them.  *please also note that you will see labs on mychart as soon as they post. I will later go in and write notes on them- will say "notes from Dr. Yong Channel"   Recommended follow up: Return in about 6 months (around 02/13/2022) for follow up- or sooner if needed.

## 2021-07-31 NOTE — Progress Notes (Signed)
Occupational Therapy Treatment Patient Details Name: [name] MRN: [id number] DOB: April 12, 1941 Today's Date: 07/31/2021   History of present illness 80 yo female admitted with Cdiff, sepsis. Hx of DM, Cdiff, SVt, ACDF 2013, vertigo   OT comments  Patient is a 80 year old female who was noted to have had some improvement in physical assistance for ADLs during this session compared to last session. Patient continues to have decreased safety awareness with decreased standing balance impacting independence in ADLs. Patient would continue to benefit from skilled OT services at this time while admitted and after d/c to address noted deficits in order to improve overall safety and independence in ADLs.     Recommendations for follow up therapy are one component of a multi-disciplinary discharge planning process, led by the attending physician.  Recommendations may be updated based on patient status, additional functional criteria and insurance authorization.    Follow Up Recommendations  Skilled nursing-short term rehab (<3 hours/day)    Assistance Recommended at Discharge Frequent or constant Supervision/Assistance  Equipment Recommendations  Other (comment)    Recommendations for Other Services      Precautions / Restrictions Precautions Precautions: Fall Restrictions Weight Bearing Restrictions: No       Mobility Bed Mobility Overal bed mobility: Needs Assistance Bed Mobility: Supine to Sit     Supine to sit: Min guard;HOB elevated          Transfers Overall transfer level: Needs assistance Equipment used: Rolling walker (2 wheels) Transfers: Sit to/from Stand Sit to Stand: Min assist Stand pivot transfers: Min assist         General transfer comment: Assist to power up, stabilize, control descent. Cues for safety, technique, hand placement.     Balance                                           ADL either performed or assessed with  clinical judgement   ADL Overall ADL's : Needs assistance/impaired Eating/Feeding: Set up;Sitting Eating/Feeding Details (indicate cue type and reason): in recliner Grooming: Set up;Standing;Wash/dry face;Oral care Grooming Details (indicate cue type and reason): at sink with increased time Upper Body Bathing: Standing;Minimal assistance Upper Body Bathing Details (indicate cue type and reason): at sink Lower Body Bathing: Minimal assistance Lower Body Bathing Details (indicate cue type and reason): partial bath seated on toilet Upper Body Dressing : Minimal assistance;Sitting       Toilet Transfer: Minimal assistance;Regular Toilet;Rolling walker (2 wheels);Grab bars Toilet Transfer Details (indicate cue type and reason): min assist to steady and to avoid obstacles in the room Toileting- Clothing Manipulation and Hygiene: Sit to/from stand;Min guard              Extremity/Trunk Assessment              Vision       Perception     Praxis      Cognition Arousal/Alertness: Awake/alert Behavior During Therapy: WFL for tasks assessed/performed Overall Cognitive Status: Within Functional Limits for tasks assessed                                 General Comments: some mild problem solving issues (repeated cues, increased time) vs hoh?          Exercises     Shoulder Instructions  General Comments      Pertinent Vitals/ Pain       Pain Assessment: Faces Faces Pain Scale: Hurts little more Pain Location: bil feet Pain Descriptors / Indicators: Discomfort;Sore Pain Intervention(s): Monitored during session;Repositioned  Home Living                                          Prior Functioning/Environment              Frequency  Min 2X/week        Progress Toward Goals  OT Goals(current goals can now be found in the care plan section)  Progress towards OT goals: Progressing toward goals     Plan Discharge  plan remains appropriate    Co-evaluation                 AM-PAC OT "6 Clicks" Daily Activity     Outcome Measure   Help from another person eating meals?: None Help from another person taking care of personal grooming?: A Little Help from another person toileting, which includes using toliet, bedpan, or urinal?: A Little Help from another person bathing (including washing, rinsing, drying)?: A Little Help from another person to put on and taking off regular upper body clothing?: A Little Help from another person to put on and taking off regular lower body clothing?: A Little 6 Click Score: 19    End of Session Equipment Utilized During Treatment: Gait belt;Rolling walker (2 wheels)  OT Visit Diagnosis: Other abnormalities of gait and mobility (R26.89);Unsteadiness on feet (R26.81);Muscle weakness (generalized) (M62.81)   Activity Tolerance Patient tolerated treatment well   Patient Left in bed;with call bell/phone within reach;with bed alarm set   Nurse Communication Mobility status        Time: 7253-6644 OT Time Calculation (min): 30 min  Charges: OT General Charges $OT Visit: 1 Visit OT Treatments $Self Care/Home Management : 23-37 mins  Jackelyn Poling OTR/L, MS Acute Rehabilitation Department Office# [phone] Pager# [phone]   Marcellina Millin 07/31/2021, 10:19 AM

## 2021-07-31 NOTE — Discharge Summary (Signed)
Physician Discharge Summary  [name] QQI:[id number] DOB: [birth year] DOA: 07/20/2021  PCP: [name], MD  Admit date: 07/20/2021 Discharge date: 07/31/2021  Admitted From: Home  Disposition:  SNF   Recommendations for Outpatient Follow-up and new medication changes:  Follow up with Dr. Yong Channel in 7 to 10 days.  Continue with prednisone and colchicine for 3 more days, for acute gout flare Discontinue HCTZ for now  Discontinue pantoprazole due to recurrent C diff   Home Health: na  Equipment/Devices: na    Discharge Condition: stable  CODE STATUS: full  Diet recommendation:  heart healthy   Brief/Interim Summary: [name] was admitted to the hospital with the working diagnosis of diarrhea post C diff colitis.  Prolonged hospitalization, bilateral feet gout flare    80 yo female with the past medical history of GERD, hypertension, dyslipidemia, who presented with cough and diarrhea. She was diagnosed with C diff as outpatient after taking antibiotic therapy for 10 days (amoxicillin) for an upper respiratory tract infection. She took oral vancomycin for 10 days, but continue to have diarrhea. She developed general weakness that prompted her to come to the hospital. On her initial physical examination blood pressure 159/76, HR 106, rr 27 and oxygen saturation 94%. Lungs  with decreased breath sounds, heart with S1 and S2 present, abdomen soft and non tender, no lower extremity edema.    Sodium 137 potassium 2.7 chloride 99, bicarb 29, glucose 132, BUN 6, creatinine 0.53, white count 14.1, globin 11.6, hematocrit 35.2, platelets 265 SARS COVID-19 negative.   Urinalysis specific gravity 1.011 Stool negative for C. Difficile   CT abdomen with diffuse colonic diverticulosis without diverticulitis no inflammatory changes.  Bilateral nonobstructing renal calculi, largest in the right kidney measuring 10 mm   131 bpm, left axis deviation, normal intervals, sinus rhythm with  PVC, no significant st segment or t wave changes    Patient has been placed on fidoxamicin, because high pretest probability for persistent C diff.     12/15 chest film with increase interstitial infiltrates more on the right lower lobe, consistent with acute pulmonary edema (non cardiogenic).    Patient medically stable to be transfer to SNF Has right lower extremity pain with difficult ambulation due to bilateral feet gout flare.    Clinically continue to improve, plan to transfer to SNF to continue with physical and occupational therapy.   Recurrent C. difficile diarrhea.  Patient received supportive medical therapy including 10 days of fidaxomicin. Her diarrhea slowly improved, at the time of her discharge it has resolved. For now avoid pantoprazole, and will recommend follow-up as an outpatient.  2.  Hypokalemia due to GI losses, chronic kidney disease stage II.  Her kidney function remained stable, electrolytes were corrected. At her discharge sodium 138, potassium 4.0, chloride 100, bicarb 31, glucose 117, BUN 12, creatinine 0.58.  3.  Noncardiogenic pulmonary edema due to volume overload with acute hypoxemic respiratory failure. She developed signs of hypervolemia, chest radiograph showed bilateral interstitial infiltrates. Patient was placed on supplemental oxygen per nasal cannula, received diuresis with furosemide. Her respiratory failure resolved, at time of her discharge she is on room air.  4.  Type 2 diabetes mellitus.  Dyslipidemia.  Her glucose remained stable during her hospitalization. Patient is no longer taking statin therapy.  Recommend outpatient follow-up.  5.  Hypertension.  Continue blood pressure control with diltiazem and valsartan. Hold on hydrochlorothiazide due to potential hyperuricemia.  6.  Acute bilateral feet arthritis, acute gout flare.  Clinical signs of acute arthritis, improved with colchicine and prednisone. Uric acid 3.4. Plan to follow-up as  an outpatient.   Discharge Diagnoses:  Principal Problem:   C. difficile diarrhea Active Problems:   Hyperlipidemia associated with type 2 diabetes mellitus ([place])   Hypertension associated with diabetes ([place])   GERD (gastroesophageal reflux disease)   Diabetes mellitus without complication (HCC)   Sepsis ([place])   CKD (chronic kidney disease) stage 2, GFR 60-89 ml/min   Hypokalemia    Discharge Instructions   Allergies as of 07/31/2021       Reactions   Codeine Nausea Only        Medication List     STOP taking these medications    atorvastatin 20 MG tablet Commonly known as: LIPITOR   Dificid 200 MG Tabs tablet Generic drug: fidaxomicin   hydrochlorothiazide 12.5 MG capsule Commonly known as: MICROZIDE   meclizine 25 MG tablet Commonly known as: ANTIVERT   ondansetron 4 MG tablet Commonly known as: Zofran   pantoprazole 20 MG tablet Commonly known as: PROTONIX   traMADol 50 MG tablet Commonly known as: ULTRAM       TAKE these medications    acetaminophen 325 MG tablet Commonly known as: TYLENOL Take 650 mg by mouth every 6 (six) hours as needed for moderate pain.   albuterol 108 (90 Base) MCG/ACT inhaler Commonly known as: VENTOLIN HFA Inhale 2 puffs into the lungs every 6 (six) hours as needed for wheezing or shortness of breath (or cough).   ALPRAZolam 0.25 MG tablet Commonly known as: XANAX Take 1 tablet (0.25 mg total) by mouth daily as needed for anxiety.   benzonatate 100 MG capsule Commonly known as: TESSALON Take 100 mg by mouth 2 (two) times daily as needed for cough.   CALCIUM + D PO Take 1 tablet by mouth daily.   colchicine 0.6 MG tablet Take 0.5 tablets (0.3 mg total) by mouth 2 (two) times daily for 3 days.   diltiazem 240 MG 24 hr capsule Commonly known as: CARDIZEM CD TAKE 1 CAPSULE BY MOUTH EVERY DAY What changed:  how much to take how to take this   dorzolamide-timolol 22.3-6.8 MG/ML ophthalmic solution Commonly  known as: COSOPT Place 1 drop into both eyes 2 (two) times daily.   escitalopram 20 MG tablet Commonly known as: LEXAPRO TAKE 1 TABLET BY MOUTH EVERY DAY   feeding supplement Liqd Take 237 mLs by mouth 3 (three) times daily between meals.   fluticasone 50 MCG/ACT nasal spray Commonly known as: FLONASE Place 2 sprays into both nostrils daily.   predniSONE 20 MG tablet Commonly known as: DELTASONE Take 1 tablet (20 mg total) by mouth daily with breakfast for 3 days.   valsartan 320 MG tablet Commonly known as: DIOVAN TAKE 1 TABLET BY MOUTH EVERY DAY        Follow-up Information     Willia Craze, NP Follow up.   Specialty: Gastroenterology Why: Jan 12th at 2 pm, arrive 15 mins early to your appointment. Contact information: [place] [address] [phone]                Allergies  Allergen Reactions   Codeine Nausea Only    Consultations: GI    Procedures/Studies: DG Chest 2 View  Result Date: 07/20/2021 CLINICAL DATA:  Shortness of breath EXAM: CHEST - 2 VIEW COMPARISON:  Chest x-ray dated July 16, 2021 FINDINGS: Cardiac and mediastinal contours are unchanged within normal limits.  Mild bibasilar opacities. No evidence of large pleural effusion or pneumothorax. IMPRESSION: Mild bibasilar opacities, likely due to atelectasis. Electronically Signed   By: Yetta Glassman M.D.   On: 07/20/2021 11:50   DG Chest 2 View  Result Date: 07/17/2021 CLINICAL DATA:  Cough and congestion. EXAM: CHEST - 2 VIEW COMPARISON:  06/27/2021. FINDINGS: Mediastinum and hilar structures normal. Heart size stable. No pulmonary venous congestion. Lung volumes with mild bibasilar atelectasis. Pleuroparenchymal thickening consistent scarring again noted. No pleural effusion or pneumothorax prior cervicothoracic fusion. Degenerative change thoracic spine. IMPRESSION: Low lung volumes with mild bibasilar atelectasis. Electronically Signed   By: Marcello Moores  Register  M.D.   On: 07/17/2021 07:13   DG Knee 1-2 Views Right  Result Date: 07/21/2021 CLINICAL DATA:  Fall, right knee pain EXAM: RIGHT KNEE - 1-2 VIEW COMPARISON:  None. FINDINGS: Chondrocalcinosis. Degenerative joint disease with joint space narrowing and spurring. Small joint effusion. No acute bony abnormality. Specifically, no fracture, subluxation, or dislocation. Soft tissues are intact. IMPRESSION: No acute bony abnormality. Chondrocalcinosis, degenerative changes and small joint effusion. Electronically Signed   By: Rolm Baptise M.D.   On: 07/21/2021 20:16   CT ABDOMEN PELVIS W CONTRAST  Result Date: 07/22/2021 CLINICAL DATA:  Diffuse abdominal pain and diarrhea for 1 month, C difficile infection EXAM: CT ABDOMEN AND PELVIS WITH CONTRAST TECHNIQUE: Multidetector CT imaging of the abdomen and pelvis was performed using the standard protocol following bolus administration of intravenous contrast. CONTRAST:  72mL OMNIPAQUE IOHEXOL 350 MG/ML SOLN COMPARISON:  06/24/2019 FINDINGS: Lower chest: There are trace bilateral pleural effusions. No acute airspace disease. Hepatobiliary: No focal liver abnormality is seen. No gallstones, gallbladder wall thickening, or biliary dilatation. Pancreas: Unremarkable. No pancreatic ductal dilatation or surrounding inflammatory changes. Spleen: Normal in size without focal abnormality. Adrenals/Urinary Tract: Stable nonspecific bilateral adrenal thickening. There are bilateral nonobstructing renal calculi unchanged, largest in the lower pole right kidney measuring 10 mm and largest in the upper pole left kidney measuring less than 2 mm. No obstructive uropathy within either kidney. Stable right renal cortical scarring and scattered bilateral renal cortical hypodensities compatible with cysts. Bladder is unremarkable. Stomach/Bowel: No bowel obstruction or ileus. There is diffuse colonic diverticulosis without evidence of acute diverticulitis. Normal appendix right lower  quadrant. No bowel wall thickening or inflammatory change. Vascular/Lymphatic: Aortic atherosclerosis. No enlarged abdominal or pelvic lymph nodes. Reproductive: Uterus and bilateral adnexa are unremarkable. Other: No free fluid or free gas. Small fat containing umbilical hernia unchanged. No bowel herniation. Musculoskeletal: No acute or destructive bony lesions. Extensive multilevel thoracolumbar spondylosis. Reconstructed images demonstrate no additional findings. IMPRESSION: 1. Diffuse colonic diverticulosis without diverticulitis. No inflammatory changes to suggest C difficile colitis. 2. Bilateral nonobstructing renal calculi, largest in the right kidney measuring 10 mm. 3. Trace bilateral pleural effusions. 4.  Aortic Atherosclerosis (ICD10-I70.0). Electronically Signed   By: Randa Ngo M.D.   On: 07/22/2021 16:42   DG CHEST PORT 1 VIEW  Result Date: 07/25/2021 CLINICAL DATA:  80 year old female with history of increasing shortness of breath and difficulty breathing this evening. EXAM: PORTABLE CHEST 1 VIEW COMPARISON:  Chest x-ray 07/23/2021. FINDINGS: Areas of interstitial prominence and patchy multifocal airspace disease are noted throughout the lungs bilaterally, most confluent in the right mid to lower lung and medial aspect of the left lower lobe. Small right pleural effusion. No definite left pleural effusion. No evidence of pulmonary edema. No pneumothorax. Heart size is normal. Upper mediastinal contours are within normal limits. Atherosclerotic calcifications in the  thoracic aorta. Orthopedic fixation hardware in the lower cervical spine incidentally noted. IMPRESSION: 1. Multilobar bilateral bronchopneumonia (right greater than left) with small right-sided parapneumonic pleural effusion. 2. Aortic atherosclerosis. Electronically Signed   By: Vinnie Langton M.D.   On: 07/25/2021 05:53   DG CHEST PORT 1 VIEW  Result Date: 07/23/2021 CLINICAL DATA:  Shortness of breath. EXAM: PORTABLE  CHEST 1 VIEW COMPARISON:  Chest x-ray dated July 20, 2021. FINDINGS: The heart size and mediastinal contours are within normal limits. Both lungs are clear. The visualized skeletal structures are unremarkable. IMPRESSION: No active disease. Electronically Signed   By: Titus Dubin M.D.   On: 07/23/2021 13:38   DG Ankle Left Port  Result Date: 07/24/2021 CLINICAL DATA:  Left ankle pain. EXAM: PORTABLE LEFT ANKLE - 2 VIEW COMPARISON:  None. FINDINGS: Evaluation of the forefoot is limited due to positioning. No acute fracture identified. No dislocation. There is degenerative changes of the ankle. Fixation K-wires noted in the first metatarsal as well as fixation screw in the second toe. The soft tissues are grossly unremarkable IMPRESSION: No acute fracture or dislocation. Electronically Signed   By: Anner Crete M.D.   On: 07/24/2021 19:49   VAS [place] LOWER EXTREMITY VENOUS (DVT)  Result Date: 07/25/2021  Lower Venous DVT Study Patient Name:  [name]  Date of Exam:   07/25/2021 Medical Rec #: [id number]       Accession #:    [id number] Date of Birth: 12/08/1940       Patient Gender: F Patient Age:   67 years Exam Location:  [facility] Procedure:      VAS [place] LOWER EXTREMITY VENOUS (DVT) Referring Phys: Jolaine Artist MEMON --------------------------------------------------------------------------------  Indications: Pain.  Risk Factors: None identified. Limitations: Poor ultrasound/tissue interface and patient positioning, patient movement. Comparison Study: No prior studies. Performing Technologist: Oliver Hum RVT  Examination Guidelines: A complete evaluation includes B-mode imaging, spectral Doppler, color Doppler, and power Doppler as needed of all accessible portions of each vessel. Bilateral testing is considered an integral part of a complete examination. Limited examinations for reoccurring indications may be performed as noted. The reflux portion of the exam is performed with  the patient in reverse Trendelenburg.  +-----+---------------+---------+-----------+----------+--------------+  RIGHT Compressibility Phasicity Spontaneity Properties Thrombus Aging  +-----+---------------+---------+-----------+----------+--------------+  CFV   Full            Yes       Yes                                    +-----+---------------+---------+-----------+----------+--------------+   +---------+---------------+---------+-----------+----------+--------------+  LEFT      Compressibility Phasicity Spontaneity Properties Thrombus Aging  +---------+---------------+---------+-----------+----------+--------------+  CFV       Full            Yes       Yes                                    +---------+---------------+---------+-----------+----------+--------------+  SFJ       Full                                                             +---------+---------------+---------+-----------+----------+--------------+  FV Prox   Full                                                             +---------+---------------+---------+-----------+----------+--------------+  FV Mid    Full                                                             +---------+---------------+---------+-----------+----------+--------------+  FV Distal Full                                                             +---------+---------------+---------+-----------+----------+--------------+  PFV       Full                                                             +---------+---------------+---------+-----------+----------+--------------+  POP       Full            Yes       Yes                                    +---------+---------------+---------+-----------+----------+--------------+  PTV       Full                                                             +---------+---------------+---------+-----------+----------+--------------+  PERO      Full                                                              +---------+---------------+---------+-----------+----------+--------------+    Summary: RIGHT: - No evidence of common femoral vein obstruction.  LEFT: - There is no evidence of deep vein thrombosis in the lower extremity.  - No cystic structure found in the popliteal fossa.  *See table(s) above for measurements and observations. Electronically signed by Monica Martinez MD on 07/25/2021 at 7:40:17 PM.    Final        Subjective: Patient is feeling better, no nausea or vomiting, her bilateral feet pain continue to improve. No diarrhea, but continue to be very weak and deconditioned   Discharge Exam: Vitals:   07/30/21 2037 07/31/21 0552  BP: 122/67 136/67  Pulse: 80 70  Resp: 17 17  Temp: 97.8 F (36.6 C) 97.6 F (36.4 C)  SpO2: 97% 97%   Vitals:   07/29/21 2009 07/30/21 1253 07/30/21 2037 07/31/21 0552  BP: 133/61 125/66 122/67 136/67  Pulse: 83 79 80 70  Resp: 18 20 17 17   Temp: 98.8 F (37.1 C) 98 F (36.7 C) 97.8 F (36.6 C) 97.6 F (36.4 C)  TempSrc:  Oral Oral Oral  SpO2: 97% 96% 97% 97%  Weight:      Height:        General: Not in pain or dyspnea  Neurology: Awake and alert, non focal  E ENT: no pallor, no icterus, oral mucosa moist Cardiovascular: No JVD. S1-S2 present, rhythmic, no gallops, rubs, or murmurs. No lower extremity edema. Pulmonary: positive breath sounds bilaterally, adequate air movement, no wheezing, rhonchi or rales. Gastrointestinal. Abdomen soft and non tender Skin. No rashes Musculoskeletal: no joint deformities   The results of significant diagnostics from this hospitalization (including imaging, microbiology, ancillary and laboratory) are listed below for reference.     Microbiology: Recent Results (from the past 240 hour(s))  C Difficile Quick Screen w PCR reflex     Status: None   Collection Time: 07/21/21  4:48 PM   Specimen: STOOL  Result Value Ref Range Status   C Diff antigen NEGATIVE NEGATIVE Final   C Diff toxin NEGATIVE  NEGATIVE Final   C Diff interpretation No C. difficile detected.  Final    Comment: Performed at [facility], [place] [address], [place],  [address]  Resp Panel by RT-PCR (Flu A&B, Covid) Nasopharyngeal Swab     Status: None   Collection Time: 07/30/21  3:20 PM   Specimen: Nasopharyngeal Swab; Nasopharyngeal(NP) swabs in vial transport medium  Result Value Ref Range Status   SARS Coronavirus 2 by RT PCR NEGATIVE NEGATIVE Final    Comment: (NOTE) SARS-CoV-2 target nucleic acids are NOT DETECTED.  The SARS-CoV-2 RNA is generally detectable in upper respiratory specimens during the acute phase of infection. The lowest concentration of SARS-CoV-2 viral copies this assay can detect is 138 copies/mL. A negative result does not preclude SARS-Cov-2 infection and should not be used as the sole basis for treatment or other patient management decisions. A negative result may occur with  improper specimen collection/handling, submission of specimen other than nasopharyngeal swab, presence of viral mutation(s) within the areas targeted by this assay, and inadequate number of viral copies(<138 copies/mL). A negative result must be combined with clinical observations, patient history, and epidemiological information. The expected result is Negative.  Fact Sheet for Patients:  EntrepreneurPulse.com.au  Fact Sheet for Healthcare Providers:  IncredibleEmployment.be  This test is no t yet approved or cleared by the [place] FDA and  has been authorized for detection and/or diagnosis of SARS-CoV-2 by FDA under an Emergency Use Authorization (EUA). This EUA will remain  in effect (meaning this test can be used) for the duration of the COVID-19 declaration under Section 564(b)(1) of the Act, 21 U.S.C.section 360bbb-3(b)(1), unless the authorization is terminated  or revoked sooner.       Influenza A by PCR NEGATIVE NEGATIVE Final    Influenza B by PCR NEGATIVE NEGATIVE Final    Comment: (NOTE) The Xpert Xpress SARS-CoV-2/FLU/RSV plus assay is intended as an aid in the diagnosis of influenza from Nasopharyngeal swab specimens and should not be used as a sole basis for treatment. Nasal washings and aspirates are unacceptable for Xpert Xpress SARS-CoV-2/FLU/RSV testing.  Fact Sheet for Patients: EntrepreneurPulse.com.au  Fact Sheet for Healthcare Providers: IncredibleEmployment.be  This test is  not yet approved or cleared by the [place] and has been authorized for detection and/or diagnosis of SARS-CoV-2 by FDA under an Emergency Use Authorization (EUA). This EUA will remain in effect (meaning this test can be used) for the duration of the COVID-19 declaration under Section 564(b)(1) of the Act, 21 U.S.C. section 360bbb-3(b)(1), unless the authorization is terminated or revoked.  Performed at [facility], [place] [address], [place], [place] [address]      Labs: BNP (last 3 results) Recent Labs    07/20/21 1155  BNP 220.2*   Basic Metabolic Panel: Recent Labs  Lab 07/25/21 0354 07/26/21 0412 07/29/21 0344  NA 139 138 138  K 4.4 4.2 4.0  CL 104 99 100  CO2 27 31 31   GLUCOSE 147* 104* 117*  BUN 13 10 12   CREATININE 0.44 0.44 0.58  CALCIUM 8.0* 8.1* 8.6*  MG 1.9 1.8  --    Liver Function Tests: No results for input(s): AST, ALT, ALKPHOS, BILITOT, PROT, ALBUMIN in the last 168 hours. No results for input(s): LIPASE, AMYLASE in the last 168 hours. No results for input(s): AMMONIA in the last 168 hours. CBC: Recent Labs  Lab 07/25/21 0354 07/26/21 0412 07/29/21 0344  WBC 14.2* 11.6* 8.8  NEUTROABS 10.6* 8.7*  --   HGB 9.7* 9.3* 10.1*  HCT 30.9* 29.3* 31.9*  MCV 97.8 96.4 94.7  PLT 317 301 437*   Cardiac Enzymes: No results for input(s): CKTOTAL, CKMB, CKMBINDEX, TROPONINI in the last 168 hours. BNP: Invalid input(s):  POCBNP CBG: No results for input(s): GLUCAP in the last 168 hours. D-Dimer No results for input(s): DDIMER in the last 72 hours. Hgb A1c No results for input(s): HGBA1C in the last 72 hours. Lipid Profile No results for input(s): CHOL, HDL, LDLCALC, TRIG, CHOLHDL, LDLDIRECT in the last 72 hours. Thyroid function studies No results for input(s): TSH, T4TOTAL, T3FREE, THYROIDAB in the last 72 hours.  Invalid input(s): FREET3 Anemia work up No results for input(s): VITAMINB12, FOLATE, FERRITIN, TIBC, IRON, RETICCTPCT in the last 72 hours. Urinalysis    Component Value Date/Time   COLORURINE YELLOW 07/20/2021 1531   APPEARANCEUR HAZY (A) 07/20/2021 1531   LABSPEC 1.011 07/20/2021 1531   PHURINE 5.0 07/20/2021 1531   GLUCOSEU NEGATIVE 07/20/2021 1531   HGBUR MODERATE (A) 07/20/2021 1531   BILIRUBINUR NEGATIVE 07/20/2021 1531   BILIRUBINUR neg 04/18/2021 1538   KETONESUR NEGATIVE 07/20/2021 1531   PROTEINUR NEGATIVE 07/20/2021 1531   UROBILINOGEN 0.2 04/18/2021 1538   UROBILINOGEN 0.2 01/22/2015 1434   NITRITE NEGATIVE 07/20/2021 1531   LEUKOCYTESUR LARGE (A) 07/20/2021 1531   Sepsis Labs Invalid input(s): PROCALCITONIN,  WBC,  LACTICIDVEN Microbiology Recent Results (from the past 240 hour(s))  C Difficile Quick Screen w PCR reflex     Status: None   Collection Time: 07/21/21  4:48 PM   Specimen: STOOL  Result Value Ref Range Status   C Diff antigen NEGATIVE NEGATIVE Final   C Diff toxin NEGATIVE NEGATIVE Final   C Diff interpretation No C. difficile detected.  Final    Comment: Performed at [facility], [place] [address], [place],  [address]  Resp Panel by RT-PCR (Flu A&B, Covid) Nasopharyngeal Swab     Status: None   Collection Time: 07/30/21  3:20 PM   Specimen: Nasopharyngeal Swab; Nasopharyngeal(NP) swabs in vial transport medium  Result Value Ref Range Status   SARS Coronavirus 2 by RT PCR NEGATIVE NEGATIVE Final    Comment:  (  NOTE) SARS-CoV-2 target nucleic acids are NOT DETECTED.  The SARS-CoV-2 RNA is generally detectable in upper respiratory specimens during the acute phase of infection. The lowest concentration of SARS-CoV-2 viral copies this assay can detect is 138 copies/mL. A negative result does not preclude SARS-Cov-2 infection and should not be used as the sole basis for treatment or other patient management decisions. A negative result may occur with  improper specimen collection/handling, submission of specimen other than nasopharyngeal swab, presence of viral mutation(s) within the areas targeted by this assay, and inadequate number of viral copies(<138 copies/mL). A negative result must be combined with clinical observations, patient history, and epidemiological information. The expected result is Negative.  Fact Sheet for Patients:  EntrepreneurPulse.com.au  Fact Sheet for Healthcare Providers:  IncredibleEmployment.be  This test is no t yet approved or cleared by the [place] FDA and  has been authorized for detection and/or diagnosis of SARS-CoV-2 by FDA under an Emergency Use Authorization (EUA). This EUA will remain  in effect (meaning this test can be used) for the duration of the COVID-19 declaration under Section 564(b)(1) of the Act, 21 U.S.C.section 360bbb-3(b)(1), unless the authorization is terminated  or revoked sooner.       Influenza A by PCR NEGATIVE NEGATIVE Final   Influenza B by PCR NEGATIVE NEGATIVE Final    Comment: (NOTE) The Xpert Xpress SARS-CoV-2/FLU/RSV plus assay is intended as an aid in the diagnosis of influenza from Nasopharyngeal swab specimens and should not be used as a sole basis for treatment. Nasal washings and aspirates are unacceptable for Xpert Xpress SARS-CoV-2/FLU/RSV testing.  Fact Sheet for Patients: EntrepreneurPulse.com.au  Fact Sheet for Healthcare  Providers: IncredibleEmployment.be  This test is not yet approved or cleared by the [place] FDA and has been authorized for detection and/or diagnosis of SARS-CoV-2 by FDA under an Emergency Use Authorization (EUA). This EUA will remain in effect (meaning this test can be used) for the duration of the COVID-19 declaration under Section 564(b)(1) of the Act, 21 U.S.C. section 360bbb-3(b)(1), unless the authorization is terminated or revoked.  Performed at [facility], [place] [address], [place], [place] [address]      Time coordinating discharge: 45 minutes  SIGNED:   Tawni Millers, MD  [facility] 07/31/2021, 10:03 AM

## 2021-07-31 NOTE — Plan of Care (Signed)
°  Problem: Education: Goal: Knowledge of General Education information will improve Description: Including pain rating scale, medication(s)/side effects and non-pharmacologic comfort measures Outcome: Progressing   Problem: Education: Goal: Knowledge of General Education information will improve Description: Including pain rating scale, medication(s)/side effects and non-pharmacologic comfort measures Outcome: Progressing   Problem: Activity: Goal: Risk for activity intolerance will decrease Outcome: Progressing   Problem: Coping: Goal: Level of anxiety will decrease Outcome: Progressing   Problem: Elimination: Goal: Will not experience complications related to bowel motility Outcome: Progressing Goal: Will not experience complications related to urinary retention Outcome: Progressing   Problem: Pain Managment: Goal: General experience of comfort will improve Outcome: Progressing   Problem: Safety: Goal: Ability to remain free from injury will improve Outcome: Progressing   Problem: Skin Integrity: Goal: Risk for impaired skin integrity will decrease Outcome: Progressing

## 2021-07-31 NOTE — TOC Progression Note (Signed)
Transition of Care [facility]) - Progression Note    Patient Details  Name: [name] MRN: [id number] Date of Birth: 03-Jul-1941  [facility] CM/SW Contact  Purcell Mouton, RN Phone Number: 07/31/2021, 11:08 AM  Clinical Narrative:    Pt is aware of her discharge to [facility].  Expected Discharge Plan: [place] Barriers to Discharge: No Barriers Identified  Expected Discharge Plan and Services Expected Discharge Plan: [place] arrangements for the past 2 months: [place] Expected Discharge Date: 07/31/21                                     Social Determinants of Health (SDOH) Interventions    Readmission Risk Interventions No flowsheet data found.

## 2021-08-01 ENCOUNTER — Telehealth: Payer: Self-pay | Admitting: Internal Medicine

## 2021-08-01 NOTE — Telephone Encounter (Signed)
Pt daughter Lattie Haw states that pt was recently treated for D diff that "resolved". Lattie Haw stated after that; the pt developed Gout and was treated with an antibiotic. Recently the pt has developed loose stools again. Lattie Haw requested that [facility]; the place where the pt is living to retest for Cdiff. Lattie Haw was notified that after those results have come back to notify our office so that we can prescribe what pt needs accordingly based on the test results. Lattie Haw verbalized understanding with all questions answered.

## 2021-08-01 NOTE — Telephone Encounter (Signed)
Patients daughter called stating that her mother is having diarrhea again. She is in a rehab facility, and they have to get permission to give her Imodium. Seeking advice, please advise.

## 2021-08-09 ENCOUNTER — Other Ambulatory Visit: Payer: Self-pay | Admitting: Family Medicine

## 2021-08-16 ENCOUNTER — Other Ambulatory Visit: Payer: Self-pay

## 2021-08-16 ENCOUNTER — Other Ambulatory Visit: Payer: Self-pay | Admitting: Family Medicine

## 2021-08-16 ENCOUNTER — Ambulatory Visit (INDEPENDENT_AMBULATORY_CARE_PROVIDER_SITE_OTHER): Payer: Medicare Other | Admitting: Family Medicine

## 2021-08-16 ENCOUNTER — Encounter: Payer: Self-pay | Admitting: Family Medicine

## 2021-08-16 VITALS — BP 130/62 | HR 66 | Temp 97.1°F | Ht 61.0 in | Wt 108.2 lb

## 2021-08-16 DIAGNOSIS — E1169 Type 2 diabetes mellitus with other specified complication: Secondary | ICD-10-CM | POA: Diagnosis not present

## 2021-08-16 DIAGNOSIS — I471 Supraventricular tachycardia: Secondary | ICD-10-CM

## 2021-08-16 DIAGNOSIS — A0472 Enterocolitis due to Clostridium difficile, not specified as recurrent: Secondary | ICD-10-CM | POA: Diagnosis not present

## 2021-08-16 DIAGNOSIS — E785 Hyperlipidemia, unspecified: Secondary | ICD-10-CM

## 2021-08-16 DIAGNOSIS — M85852 Other specified disorders of bone density and structure, left thigh: Secondary | ICD-10-CM

## 2021-08-16 DIAGNOSIS — F325 Major depressive disorder, single episode, in full remission: Secondary | ICD-10-CM | POA: Diagnosis not present

## 2021-08-16 DIAGNOSIS — K219 Gastro-esophageal reflux disease without esophagitis: Secondary | ICD-10-CM

## 2021-08-16 DIAGNOSIS — I152 Hypertension secondary to endocrine disorders: Secondary | ICD-10-CM

## 2021-08-16 DIAGNOSIS — E119 Type 2 diabetes mellitus without complications: Secondary | ICD-10-CM

## 2021-08-16 DIAGNOSIS — E1159 Type 2 diabetes mellitus with other circulatory complications: Secondary | ICD-10-CM

## 2021-08-16 DIAGNOSIS — I700 Atherosclerosis of aorta: Secondary | ICD-10-CM

## 2021-08-16 LAB — CBC WITH DIFFERENTIAL/PLATELET
Basophils Absolute: 0 10*3/uL (ref 0.0–0.1)
Basophils Relative: 0.6 % (ref 0.0–3.0)
Eosinophils Absolute: 0.1 10*3/uL (ref 0.0–0.7)
Eosinophils Relative: 1.9 % (ref 0.0–5.0)
HCT: 38.6 % (ref 36.0–46.0)
Hemoglobin: 12.4 g/dL (ref 12.0–15.0)
Lymphocytes Relative: 25.1 % (ref 12.0–46.0)
Lymphs Abs: 1.8 10*3/uL (ref 0.7–4.0)
MCHC: 32.1 g/dL (ref 30.0–36.0)
MCV: 92.8 fl (ref 78.0–100.0)
Monocytes Absolute: 0.6 10*3/uL (ref 0.1–1.0)
Monocytes Relative: 7.9 % (ref 3.0–12.0)
Neutro Abs: 4.7 10*3/uL (ref 1.4–7.7)
Neutrophils Relative %: 64.5 % (ref 43.0–77.0)
Platelets: 245 10*3/uL (ref 150.0–400.0)
RBC: 4.16 Mil/uL (ref 3.87–5.11)
RDW: 13.9 % (ref 11.5–15.5)
WBC: 7.3 10*3/uL (ref 4.0–10.5)

## 2021-08-16 LAB — COMPREHENSIVE METABOLIC PANEL
ALT: 20 U/L (ref 0–35)
AST: 13 U/L (ref 0–37)
Albumin: 3.8 g/dL (ref 3.5–5.2)
Alkaline Phosphatase: 58 U/L (ref 39–117)
BUN: 13 mg/dL (ref 6–23)
CO2: 30 mEq/L (ref 19–32)
Calcium: 9.5 mg/dL (ref 8.4–10.5)
Chloride: 101 mEq/L (ref 96–112)
Creatinine, Ser: 0.65 mg/dL (ref 0.40–1.20)
GFR: 81.87 mL/min (ref >60.00)
Glucose, Bld: 121 mg/dL — ABNORMAL HIGH (ref 70–99)
Potassium: 4.2 mEq/L (ref 3.5–5.1)
Sodium: 140 mEq/L (ref 135–145)
Total Bilirubin: 0.7 mg/dL (ref 0.2–1.2)
Total Protein: 6.5 g/dL (ref 6.0–8.3)

## 2021-08-16 LAB — TSH: TSH: 0.87 u[IU]/mL (ref 0.35–5.50)

## 2021-08-16 MED ORDER — ROSUVASTATIN CALCIUM 10 MG PO TABS: 10.0000 mg | ORAL_TABLET | ORAL | 3 refills | Status: DC

## 2021-08-22 ENCOUNTER — Ambulatory Visit: Payer: Medicare Other | Admitting: Nurse Practitioner

## 2021-09-06 LAB — HM MAMMOGRAPHY

## 2021-09-12 ENCOUNTER — Telehealth: Payer: Self-pay | Admitting: Internal Medicine

## 2021-09-12 ENCOUNTER — Encounter: Payer: Self-pay | Admitting: Internal Medicine

## 2021-09-12 ENCOUNTER — Ambulatory Visit (INDEPENDENT_AMBULATORY_CARE_PROVIDER_SITE_OTHER): Payer: Medicare Other | Admitting: Internal Medicine

## 2021-09-12 ENCOUNTER — Other Ambulatory Visit (INDEPENDENT_AMBULATORY_CARE_PROVIDER_SITE_OTHER): Payer: Medicare Other

## 2021-09-12 VITALS — BP 120/64 | HR 74 | Ht 61.0 in | Wt 112.0 lb

## 2021-09-12 DIAGNOSIS — R197 Diarrhea, unspecified: Secondary | ICD-10-CM

## 2021-09-12 DIAGNOSIS — E876 Hypokalemia: Secondary | ICD-10-CM

## 2021-09-12 DIAGNOSIS — Z8619 Personal history of other infectious and parasitic diseases: Secondary | ICD-10-CM

## 2021-09-12 LAB — BASIC METABOLIC PANEL
BUN: 14 mg/dL (ref 6–23)
CO2: 32 mEq/L (ref 19–32)
Calcium: 9.3 mg/dL (ref 8.4–10.5)
Chloride: 103 mEq/L (ref 96–112)
Creatinine, Ser: 0.63 mg/dL (ref 0.40–1.20)
GFR: 82.45 mL/min (ref >60.00)
Glucose, Bld: 98 mg/dL (ref 70–99)
Potassium: 3.6 mEq/L (ref 3.5–5.1)
Sodium: 143 mEq/L (ref 135–145)

## 2021-09-12 NOTE — Telephone Encounter (Signed)
Patient stated she is returning your call, please advise.

## 2021-09-12 NOTE — Progress Notes (Signed)
[name] 81 y.o. [birth year] [id number]  Assessment & Plan:   Encounter Diagnoses  Name Primary?   Watery diarrhea Yes   History of Clostridioides difficile infection    Hypokalemia     I am not convinced she has had C. difficile because I find it extremely rare if not a never situation that people respond to vancomycin.  That said she did seem to resolve after treatment with Dificid however this was short-lived.  It is curious that she had a C. difficile toxin that tested for a and B+ so it could have been the be toxin positive on her initial test (that tends not to be pathogenic) but subsequently had negative antigen and toxin testing at the hospital and apparently also at the SNF.  These recurrent symptoms merit investigation, I am going to have her do a C. difficile PCR toxin test and if that is positive I would retreat her.  Would need to consider a long taper versus perhaps Rebyota or the monoclonal antibody treatment.  If its negative she may need a colonoscopy, especially if the fecal calprotectin test I am doing is positive.  We will recheck a be met as well given her history of dehydration and hypokalemia.  In the meantime take 2 loperamide morning and evening before her meals and then add 2 at bedtime if needed.  CC: [name]    Subjective:   Chief Complaint: Diarrhea after C. difficile treatment  HPI 81 year old white woman presenting with her daughter, she has a history of IBS diarrhea predominance status post a colonoscopy with negative biopsies for microscopic colitis in May 2022, who had a GI pathogen profile C. difficile toxin AB test positive when she presented with diarrhea in November.  She was treated with vancomycin but continued to have profuse watery diarrhea and then became severely dehydrated and was admitted to the hospital.  Prior to being admitted to the hospital because of the persistent symptoms I had prescribed Dificid she took a  couple of those and then finished while she was in the hospital and or SNF stay after hospitalization in December 2022.  It sounds like her diarrhea did resolve for a week or 2.  However it has come back on and she is having several watery stools a day including overnight.  She is taking Imodium A-D 2-4 a day with benefit but persistent problems.  She has some mild chronic lower abdominal pain that persists.  Having 6-8 stools a day.  She is trying to keep up with hydration.  She saw [name] in the first week of January and did have normal electrolytes and renal function.  Her CBC was totally normal as well.  When she was hospitalized for C. difficile quick screen was negative.  C. difficile testing at the SNF was also negative.   Wt Readings from Last 3 Encounters:  09/12/21 112 lb (50.8 kg)  08/16/21 108 lb 3.2 oz (49.1 kg)  07/21/21 117 lb 1 oz (53.1 kg)    Allergies  Allergen Reactions   Codeine Nausea Only   Current Meds  Medication Sig   acetaminophen (TYLENOL) 325 MG tablet Take 650 mg by mouth every 6 (six) hours as needed for moderate pain.   albuterol (VENTOLIN HFA) 108 (90 Base) MCG/ACT inhaler Inhale 2 puffs into the lungs every 6 (six) hours as needed for wheezing or shortness of breath (or cough).   ALPRAZolam (XANAX) 0.25 MG tablet Take 1 tablet (0.25 mg  total) by mouth daily as needed for anxiety.   Calcium Carbonate-Vitamin D (CALCIUM + D PO) Take 1 tablet by mouth daily.   diltiazem (CARDIZEM CD) 240 MG 24 hr capsule TAKE 1 CAPSULE BY MOUTH EVERY DAY (Patient taking differently: 240 mg daily.)   dorzolamide-timolol (COSOPT) 22.3-6.8 MG/ML ophthalmic solution Place 1 drop into both eyes 2 (two) times daily.   escitalopram (LEXAPRO) 20 MG tablet TAKE 1 TABLET BY MOUTH EVERY DAY (Patient taking differently: Take 20 mg by mouth daily.)   fluticasone (FLONASE) 50 MCG/ACT nasal spray SPRAY 2 SPRAYS INTO EACH NOSTRIL EVERY DAY   rosuvastatin (CRESTOR) 10 MG tablet Take 1 tablet  (10 mg total) by mouth once a week.   valsartan (DIOVAN) 320 MG tablet TAKE 1 TABLET BY MOUTH EVERY DAY   Past Medical History:  Diagnosis Date   Adenomatous colon polyp 08/11/2004   Anxiety    Arthritis    Benign esophageal stricture 10/14/2012   CERUMEN IMPACTION, BILATERAL 12/13/2007   CERVICAL RADICULOPATHY 11/05/2009   Chronic kidney disease    kidney stones   DEPRESSION 03/11/2007   DIVERTICULOSIS, COLON 03/11/2007   Enteritis due to Clostridium difficile    Esophagitis, reflux 10/14/2012   GERD (gastroesophageal reflux disease) 10/14/2012   Glaucoma    GLAUCOMA ASSOCIATED W/UNSPEC OCULAR DISORDER 11/05/2009   Hearing loss    HEMORRHOIDS, EXTERNAL W/O COMPLICATION 94/85/4627   Qualifier: Diagnosis of  By: Jimmye Norman, LPN, Winfield Cunas    HYPERLIPIDEMIA 09/22/2007   HYPERTENSION 03/11/2007   Kidney stone    Osteopenia 02/09/2015   T score -1.4 FRAX 7.3%/0.6%   Rectocele    Rosacea 03/11/2007   Tachycardia    Past Surgical History:  Procedure Laterality Date   ANTERIOR CERVICAL DECOMP/DISCECTOMY FUSION  08/14/2011   Procedure: ANTERIOR CERVICAL DECOMPRESSION/DISCECTOMY FUSION 1 LEVEL/HARDWARE REMOVAL;  Surgeon: [name], Montoya;  Location: [facility];  Service: Neurosurgery;  Laterality: N/A;  Cervical Six-Seven anterior cervical decompression with fusion interbody prothesis plating and bonegraft with removal of hardware of Cervical Four to Cervical Six    CERVICAL FUSION     C4-6   COLONOSCOPY     multiple   ESOPHAGOGASTRODUODENOSCOPY  10/14/2012   Dr. [name]   KIDNEY SURGERY     age 69   TONSILLECTOMY     Social History   Social History Narrative   Lives alone. Widowed. 2 living children (lost son to MI 2019 and daughter to pancreatic cancer 2020, another son MI 2021) and 7 grandchildren. 3 greatgrandkids      Retired from [employer] after 5 years and later took care of children.       Hobbies: time with grandkids, shopping, go out to eat. Travels once a year  with girlfriends.    family history includes Cerebral palsy in her sister; Diabetes in her maternal grandmother; Glaucoma in her mother; Heart attack in her son and son; Heart disease in her father; Hypertension in her father; Lung cancer in her father; Mental illness in her mother; Pancreatic cancer in her daughter; Stroke in her paternal aunt, paternal aunt, paternal uncle, paternal uncle, paternal uncle, and sister.   Review of Systems As above  Objective:   Physical Exam BP 120/64    Pulse 74    Ht '5\' 1"'  (1.549 m)    Wt 112 lb (50.8 kg)    LMP 08/12/1991    BMI 21.16 kg/m  Petite elderly ww NAD Lungs cta Cor NL S1S2 Abd soft and mildly tender  LLQ which is chronic

## 2021-09-12 NOTE — Telephone Encounter (Signed)
Pt was made aware of recent labs: Pt verbalized understanding with all questions answered.

## 2021-09-12 NOTE — Patient Instructions (Signed)
Your provider has requested that you go to the basement level for lab work before leaving today. Press "B" on the elevator. The lab is located at the first door on the left as you exit the elevator.  Due to recent changes in healthcare laws, you may see the results of your imaging and laboratory studies on MyChart before your provider has had a chance to review them.  We understand that in some cases there may be results that are confusing or concerning to you. Not all laboratory results come back in the same time frame and the provider may be waiting for multiple results in order to interpret others.  Please give [place] 48 hours in order for your provider to thoroughly review all the results before contacting the office for clarification of your results.   Take over the counter Imodium  A-D (loperamide) as follows: 2 tablets before breakfast, 2 tablets before supper and if needed take 2 tablets before bedtime.  Keep eating and drinking.  I appreciate the opportunity to care for you. [name], MD, [facility]

## 2021-09-12 NOTE — H&P (View-Only) (Signed)
[name] 81 y.o. October 20, 1940 [id number]  Assessment & Plan:   Encounter Diagnoses  Name Primary?   Watery diarrhea Yes   History of Clostridioides difficile infection    Hypokalemia     I am not convinced she has had C. difficile because I find it extremely rare if not a never situation that people respond to vancomycin.  That said she did seem to resolve after treatment with Dificid however this was short-lived.  It is curious that she had a C. difficile toxin that tested for a and B+ so it could have been the be toxin positive on her initial test (that tends not to be pathogenic) but subsequently had negative antigen and toxin testing at the hospital and apparently also at the SNF.  These recurrent symptoms merit investigation, I am going to have her do a C. difficile PCR toxin test and if that is positive I would retreat her.  Would need to consider a long taper versus perhaps Rebyota or the monoclonal antibody treatment.  If its negative she may need a colonoscopy, especially if the fecal calprotectin test I am doing is positive.  We will recheck a be met as well given her history of dehydration and hypokalemia.  In the meantime take 2 loperamide morning and evening before her meals and then add 2 at bedtime if needed.  CC: [name], MD    Subjective:   Chief Complaint: Diarrhea after C. difficile treatment  HPI 81 year old white woman presenting with her daughter, she has a history of IBS diarrhea predominance status post a colonoscopy with negative biopsies for microscopic colitis in May 2022, who had a GI pathogen profile C. difficile toxin AB test positive when she presented with diarrhea in November.  She was treated with vancomycin but continued to have profuse watery diarrhea and then became severely dehydrated and was admitted to the hospital.  Prior to being admitted to the hospital because of the persistent symptoms I had prescribed Dificid she took a  couple of those and then finished while she was in the hospital and or SNF stay after hospitalization in December 2022.  It sounds like her diarrhea did resolve for a week or 2.  However it has come back on and she is having several watery stools a day including overnight.  She is taking Imodium A-D 2-4 a day with benefit but persistent problems.  She has some mild chronic lower abdominal pain that persists.  Having 6-8 stools a day.  She is trying to keep up with hydration.  She saw Dr. Yong Channel in the first week of January and did have normal electrolytes and renal function.  Her CBC was totally normal as well.  When she was hospitalized for C. difficile quick screen was negative.  C. difficile testing at the SNF was also negative.   Wt Readings from Last 3 Encounters:  09/12/21 112 lb (50.8 kg)  08/16/21 108 lb 3.2 oz (49.1 kg)  07/21/21 117 lb 1 oz (53.1 kg)    Allergies  Allergen Reactions   Codeine Nausea Only   Current Meds  Medication Sig   acetaminophen (TYLENOL) 325 MG tablet Take 650 mg by mouth every 6 (six) hours as needed for moderate pain.   albuterol (VENTOLIN HFA) 108 (90 Base) MCG/ACT inhaler Inhale 2 puffs into the lungs every 6 (six) hours as needed for wheezing or shortness of breath (or cough).   ALPRAZolam (XANAX) 0.25 MG tablet Take 1 tablet (0.25 mg  total) by mouth daily as needed for anxiety.   Calcium Carbonate-Vitamin D (CALCIUM + D PO) Take 1 tablet by mouth daily.   diltiazem (CARDIZEM CD) 240 MG 24 hr capsule TAKE 1 CAPSULE BY MOUTH EVERY DAY (Patient taking differently: 240 mg daily.)   dorzolamide-timolol (COSOPT) 22.3-6.8 MG/ML ophthalmic solution Place 1 drop into both eyes 2 (two) times daily.   escitalopram (LEXAPRO) 20 MG tablet TAKE 1 TABLET BY MOUTH EVERY DAY (Patient taking differently: Take 20 mg by mouth daily.)   fluticasone (FLONASE) 50 MCG/ACT nasal spray SPRAY 2 SPRAYS INTO EACH NOSTRIL EVERY DAY   rosuvastatin (CRESTOR) 10 MG tablet Take 1 tablet  (10 mg total) by mouth once a week.   valsartan (DIOVAN) 320 MG tablet TAKE 1 TABLET BY MOUTH EVERY DAY   Past Medical History:  Diagnosis Date   Adenomatous colon polyp 08/11/2004   Anxiety    Arthritis    Benign esophageal stricture 10/14/2012   CERUMEN IMPACTION, BILATERAL 12/13/2007   CERVICAL RADICULOPATHY 11/05/2009   Chronic kidney disease    kidney stones   DEPRESSION 03/11/2007   DIVERTICULOSIS, COLON 03/11/2007   Enteritis due to Clostridium difficile    Esophagitis, reflux 10/14/2012   GERD (gastroesophageal reflux disease) 10/14/2012   Glaucoma    GLAUCOMA ASSOCIATED W/UNSPEC OCULAR DISORDER 11/05/2009   Hearing loss    HEMORRHOIDS, EXTERNAL W/O COMPLICATION 76/81/1572   Qualifier: Diagnosis of  By: Jimmye Norman, LPN, Winf[place] Cunas    HYPERLIPIDEMIA 09/22/2007   HYPERTENSION 03/11/2007   Kidney stone    Osteopenia 02/09/2015   T score -1.4 FRAX 7.3%/0.6%   Rectocele    Rosacea 03/11/2007   Tachycardia    Past Surgical History:  Procedure Laterality Date   ANTERIOR CERVICAL DECOMP/DISCECTOMY FUSION  08/14/2011   Procedure: ANTERIOR CERVICAL DECOMPRESSION/DISCECTOMY FUSION 1 LEVEL/HARDWARE REMOVAL;  Surgeon: [name], MD;  Location: [facility];  Service: Neurosurgery;  Laterality: N/A;  Cervical Six-Seven anterior cervical decompression with fusion interbody prothesis plating and bonegraft with removal of hardware of Cervical Four to Cervical Six    CERVICAL FUSION     C4-6   COLONOSCOPY     multiple   ESOPHAGOGASTRODUODENOSCOPY  10/14/2012   Dr. [name]   KIDNEY SURGERY     age 38   TONSILLECTOMY     Social History   Social History Narrative   Lives alone. Widowed. 2 living children (lost son to MI 2019 and daughter to pancreatic cancer 2020, another son MI 2021) and 7 grandchildren. 3 greatgrandkids      Retired from [employer] after 5 years and later took care of children.       Hobbies: time with grandkids, shopping, go out to eat. Travels once a year  with girlfriends.    family history includes Cerebral palsy in her sister; Diabetes in her maternal grandmother; Glaucoma in her mother; Heart attack in her son and son; Heart disease in her father; Hypertension in her father; Lung cancer in her father; Mental illness in her mother; Pancreatic cancer in her daughter; Stroke in her paternal aunt, paternal aunt, paternal uncle, paternal uncle, paternal uncle, and sister.   Review of Systems As above  Objective:   Physical Exam BP 120/64    Pulse 74    Ht '5\' 1"'  (1.549 m)    Wt 112 lb (50.8 kg)    LMP 08/12/1991    BMI 21.16 kg/m  Petite elderly ww NAD Lungs cta Cor NL S1S2 Abd soft and mildly tender  LLQ which is chronic

## 2021-09-13 ENCOUNTER — Telehealth: Payer: Self-pay | Admitting: Internal Medicine

## 2021-09-13 DIAGNOSIS — A0472 Enterocolitis due to Clostridium difficile, not specified as recurrent: Secondary | ICD-10-CM

## 2021-09-13 LAB — CLOSTRIDIUM DIFFICILE TOXIN B, QUALITATIVE, REAL-TIME PCR: Toxigenic C. Difficile by PCR: DETECTED — AB

## 2021-09-13 MED ORDER — VANCOMYCIN HCL 125 MG PO CAPS: 125.0000 mg | ORAL_CAPSULE | Freq: Four times a day (QID) | ORAL | 0 refills | Status: AC

## 2021-09-13 NOTE — Telephone Encounter (Signed)
+   recurrent c diff Cannot get to pharmacy tonight Has 8 dificid tablets left from recent infection  I recommended she start dificid tonight and then [facility] vancomycin 125 mg QID x 14 days We will need to consider a taper from there  Dr. Carlean Purl can determine the taper when he returns.  I will send in the vanco. I explained this all to her and let her know she could call me back this weekend if any issues.   Time provided for questions and answers and she thanked me for the call.  She may possibly be a candidate for Rebyota?

## 2021-09-15 ENCOUNTER — Other Ambulatory Visit: Payer: Self-pay | Admitting: Family Medicine

## 2021-09-15 LAB — CALPROTECTIN, FECAL: Calprotectin, Fecal: 305 ug/g — ABNORMAL HIGH (ref 0–120)

## 2021-09-16 NOTE — Telephone Encounter (Signed)
Follow Up Call made to Pt Daughter Lattie Haw: Lattie Haw stated that Vielka has been taking the medication as prescribed  although she stated that the pt is still having diarrhea. Lattie Haw notified that it does take several days for the medication to start taking effect: Lattie Haw notified for the pt to drink Lots of fluids. Lattie Haw Verbalized understanding with all questions answered.

## 2021-09-18 NOTE — Telephone Encounter (Signed)
Dr. Carlean Purl Pt Pt states that the  vancomycin is not working at all and that she has been taking the medication for 5 days.  Pt states that she is still having At Least 6 diarrhea stools during the day and at least 3 at night.  Pt states that this is the 3rd treatment for the Cdiff.  Pt was first diagnosed on 06/28/2021 per Lab results.  Please advise as DOD

## 2021-09-18 NOTE — Telephone Encounter (Signed)
Patient called requesting to speak with you because she said the medication is not working.

## 2021-09-19 ENCOUNTER — Other Ambulatory Visit: Payer: Self-pay

## 2021-09-19 DIAGNOSIS — R197 Diarrhea, unspecified: Secondary | ICD-10-CM

## 2021-09-24 ENCOUNTER — Telehealth: Payer: Self-pay | Admitting: Internal Medicine

## 2021-09-24 NOTE — Telephone Encounter (Signed)
Pt stated that she has not received any information about her upcoming procedure. Pt and daughter [name] notified that letter was sent by another RN named Patty on 09/19/2021 with all the prep instructions for the procedure. Pt and pt daughter [name] verbalized understanding with all questions answered.

## 2021-09-24 NOTE — Telephone Encounter (Signed)
Inbound call from patient calling in with questions about upcoming procedure and what she is to do. States she have not received or heard anything.

## 2021-09-26 ENCOUNTER — Encounter (HOSPITAL_COMMUNITY): Payer: Self-pay | Admitting: Gastroenterology

## 2021-09-26 ENCOUNTER — Encounter (HOSPITAL_COMMUNITY)
Admission: RE | Disposition: A | Discharge status: Home / Self Care | Payer: Self-pay | Source: Home / Self Care | Attending: Gastroenterology

## 2021-09-26 ENCOUNTER — Ambulatory Visit (HOSPITAL_COMMUNITY)
Admission: RE | Admit: 2021-09-26 | Discharge: 2021-09-26 | Disposition: A | Discharge status: Home / Self Care | Payer: Medicare Other | Attending: Gastroenterology | Admitting: Gastroenterology

## 2021-09-26 ENCOUNTER — Ambulatory Visit (HOSPITAL_COMMUNITY): Payer: Medicare Other | Admitting: Registered Nurse

## 2021-09-26 ENCOUNTER — Ambulatory Visit (HOSPITAL_BASED_OUTPATIENT_CLINIC_OR_DEPARTMENT_OTHER): Payer: Medicare Other | Admitting: Registered Nurse

## 2021-09-26 DIAGNOSIS — E119 Type 2 diabetes mellitus without complications: Secondary | ICD-10-CM | POA: Insufficient documentation

## 2021-09-26 DIAGNOSIS — F32A Depression, unspecified: Secondary | ICD-10-CM | POA: Diagnosis not present

## 2021-09-26 DIAGNOSIS — K5730 Diverticulosis of large intestine without perforation or abscess without bleeding: Secondary | ICD-10-CM

## 2021-09-26 DIAGNOSIS — F418 Other specified anxiety disorders: Secondary | ICD-10-CM | POA: Diagnosis not present

## 2021-09-26 DIAGNOSIS — K529 Noninfective gastroenteritis and colitis, unspecified: Secondary | ICD-10-CM | POA: Insufficient documentation

## 2021-09-26 DIAGNOSIS — I10 Essential (primary) hypertension: Secondary | ICD-10-CM | POA: Diagnosis not present

## 2021-09-26 DIAGNOSIS — F419 Anxiety disorder, unspecified: Secondary | ICD-10-CM | POA: Diagnosis not present

## 2021-09-26 DIAGNOSIS — R197 Diarrhea, unspecified: Secondary | ICD-10-CM

## 2021-09-26 HISTORY — PX: COLONOSCOPY: SHX5424

## 2021-09-26 HISTORY — PX: BIOPSY: SHX5522

## 2021-09-26 SURGERY — COLONOSCOPY
Anesthesia: Monitor Anesthesia Care

## 2021-09-26 MED ORDER — LACTATED RINGERS IV SOLN: INTRAVENOUS | Status: DC | PRN

## 2021-09-26 MED ORDER — PROPOFOL 10 MG/ML IV BOLUS
INTRAVENOUS | Status: DC | PRN
  Administered 2021-09-26: 50 mg via INTRAVENOUS

## 2021-09-26 MED ORDER — PROPOFOL 500 MG/50ML IV EMUL
INTRAVENOUS | Status: DC | PRN
Start: 2021-09-26 — End: 2021-09-26
  Administered 2021-09-26: 300 ug/kg/min via INTRAVENOUS

## 2021-09-26 MED ORDER — SODIUM CHLORIDE 0.9 % IV SOLN: INTRAVENOUS | Status: DC

## 2021-09-26 NOTE — Discharge Instructions (Signed)

## 2021-09-26 NOTE — Anesthesia Procedure Notes (Addendum)
Procedure Name: MAC Date/Time: 09/26/2021 10:15 AM Performed by: Lissa Morales, CRNA Pre-anesthesia Checklist: Patient identified, Emergency Drugs available, Suction available, Patient being monitored and Timeout performed Patient Re-evaluated:Patient Re-evaluated prior to induction Oxygen Delivery Method: Simple face mask Preoxygenation: Pre-oxygenation with 100% oxygen Placement Confirmation: positive ETCO2

## 2021-09-26 NOTE — Op Note (Signed)
[facility] Patient Name: [name] Procedure Date: 09/26/2021 MRN: [id number] Attending MD: Milus Banister , MD Date of Birth: 02-16-1941 CSN: [id number] Age: 81 Admit Type: Outpatient Procedure:                Colonoscopy Indications:              Chronic diarrhea, unclear etiology, ? persistent C.                            diff Providers:                Milus Banister, MD, Doristine Johns, RN, Luan Moore, Technician, Enrigue Catena, CRNA Referring MD:             [name], MD Medicines:                Monitored Anesthesia Care Complications:            No immediate complications. Estimated blood loss:                            None. Estimated Blood Loss:     Estimated blood loss: none. Procedure:                Pre-Anesthesia Assessment:                           - Prior to the procedure, a History and Physical                            was performed, and patient medications and                            allergies were reviewed. The patient's tolerance of                            previous anesthesia was also reviewed. The risks                            and benefits of the procedure and the sedation                            options and risks were discussed with the patient.                            All questions were answered, and informed consent                            was obtained. Prior Anticoagulants: The patient has                            taken no previous anticoagulant or antiplatelet                            agents. ASA  Grade Assessment: IV - A patient with                            severe systemic disease that is a constant threat                            to life. After reviewing the risks and benefits,                            the patient was deemed in satisfactory condition to                            undergo the procedure.                           After obtaining informed consent, the  colonoscope                            was passed under direct vision. Throughout the                            procedure, the patient's blood pressure, pulse, and                            oxygen saturations were monitored continuously. The                            CF-[id number] ([id number]) Olympus colonoscope was                            introduced through the anus and advanced to the the                            terminal ileum. After obtaining informed consent,                            the colonoscope was passed under direct vision.                            Throughout the procedure, the patient's blood                            pressure, pulse, and oxygen saturations were                            monitored continuously. The colonoscopy was                            performed without difficulty. The patient tolerated                            the procedure well. The quality of the bowel  preparation was good. The terminal ileum, ileocecal                            valve, appendiceal orifice, and rectum were                            photographed. Scope In: 10:20:54 AM Scope Out: 10:30:08 AM Scope Withdrawal Time: 0 hours 5 minutes 58 seconds  Total Procedure Duration: 0 hours 9 minutes 14 seconds  Findings:      The terminal ileum appeared normal.      Multiple small and large-mouthed diverticula were found in the entire       colon.      The colon mucosa was normal. No pseudomembranes.      Biopsies were taken from the colon randomly to check for microscopic       changes.      The exam was otherwise without abnormality on direct and retroflexion       views. Impression:               - The examined portion of the ileum was normal.                           - Diverticulosis in the entire examined colon.                           - The examination was otherwise normal on direct                            and retroflexion views. No  pseudomembranes.                           - Biopsies were taken with a cold forceps from the                            entire colon to check for microscopic changes.                           - She is going to start taking a single OTC imodium                            pill every morning on a scheduled basis for now,                            can repeat later in day if needed. Moderate Sedation:      Not Applicable - Patient had care per Anesthesia. Recommendation:           - Discharge patient to home.                           - Await final pathology results. Procedure Code(s):        --- Professional ---                           [phone], Colonoscopy, flexible; with biopsy, single  or multiple Diagnosis Code(s):        --- Professional ---                           K52.9, Noninfective gastroenteritis and colitis,                            unspecified                           K57.30, Diverticulosis of large intestine without                            perforation or abscess without bleeding CPT copyright 2019 American Medical Association. All rights reserved. The codes documented in this report are preliminary and upon coder review may  be revised to meet current compliance requirements. Milus Banister, MD 09/26/2021 10:46:12 AM This report has been signed electronically. Number of Addenda: 0

## 2021-09-26 NOTE — Progress Notes (Signed)
Gatorade with miralax prep. Drank all. Unable to give self enema. Results are watery yellow.

## 2021-09-26 NOTE — Interval H&P Note (Signed)
History and Physical Interval Note:  09/26/2021 10:08 AM  [name]  has presented today for surgery, with the diagnosis of diarrhea.  The various methods of treatment have been discussed with the patient and family. After consideration of risks, benefits and other options for treatment, the patient has consented to  Procedure(s): FLEXIBLE SIGMOIDOSCOPY (N/A) as a surgical intervention.  The patient's history has been reviewed, patient examined, no change in status, stable for surgery.  I have reviewed the patient's chart and labs.  Questions were answered to the patient's satisfaction.     Milus Banister

## 2021-09-26 NOTE — Anesthesia Postprocedure Evaluation (Signed)
Anesthesia Post Note  Patient: [name]  Procedure(s) Performed: COLONOSCOPY BIOPSY     Patient location during evaluation: Endoscopy Anesthesia Type: MAC Level of consciousness: oriented, awake and alert and awake Pain management: pain level controlled Vital Signs Assessment: post-procedure vital signs reviewed and stable Respiratory status: spontaneous breathing, nonlabored ventilation, respiratory function stable and patient connected to nasal cannula oxygen Cardiovascular status: blood pressure returned to baseline and stable Postop Assessment: no headache, no backache and no apparent nausea or vomiting Anesthetic complications: no   No notable events documented.  Last Vitals:  Vitals:   09/26/21 1100 09/26/21 1108  BP: (!) 160/61 (!) 157/50  Pulse: 70 66  Resp: 20 16  Temp:    SpO2: 100% 99%    Last Pain:  Vitals:   09/26/21 1100  TempSrc:   PainSc: 0-No pain                 Santa Lighter

## 2021-09-26 NOTE — Anesthesia Preprocedure Evaluation (Addendum)
Anesthesia Evaluation  Patient identified by MRN, date of birth, ID band Patient awake    Reviewed: Allergy & Precautions, NPO status , Patient's Chart, lab work & pertinent test results  Airway Mallampati: II  TM Distance: >3 FB Neck ROM: Full    Dental  (+) Teeth Intact, Dental Advisory Given, Caps,    Pulmonary neg pulmonary ROS,    Pulmonary exam normal breath sounds clear to auscultation       Cardiovascular hypertension, Pt. on medications Normal cardiovascular exam Rhythm:Regular Rate:Normal     Neuro/Psych PSYCHIATRIC DISORDERS Anxiety Depression  Neuromuscular disease    GI/Hepatic Neg liver ROS, GERD  Medicated,Diarrhea    Endo/Other  diabetes  Renal/GU Renal InsufficiencyRenal disease     Musculoskeletal  (+) Arthritis ,   Abdominal   Peds  Hematology negative hematology ROS (+)   Anesthesia Other Findings Day of surgery medications reviewed with the patient.  Reproductive/Obstetrics                             Anesthesia Physical Anesthesia Plan  ASA: 3  Anesthesia Plan: MAC   Post-op Pain Management:    Induction: Intravenous  PONV Risk Score and Plan: 2 and TIVA and Treatment may vary due to age or medical condition  Airway Management Planned: Natural Airway and Nasal Cannula  Additional Equipment:   Intra-op Plan:   Post-operative Plan:   Informed Consent: I have reviewed the patients History and Physical, chart, labs and discussed the procedure including the risks, benefits and alternatives for the proposed anesthesia with the patient or authorized representative who has indicated his/her understanding and acceptance.     Dental advisory given  Plan Discussed with: CRNA  Anesthesia Plan Comments:         Anesthesia Quick Evaluation

## 2021-09-26 NOTE — Transfer of Care (Signed)
Immediate Anesthesia Transfer of Care Note  Patient: [name]  Procedure(s) Performed: COLONOSCOPY BIOPSY  Patient Location: PACU  Anesthesia Type:MAC  Level of Consciousness: sedated and patient cooperative  Airway & Oxygen Therapy: Patient Spontanous Breathing and Patient connected to face mask oxygen  Post-op Assessment: Report given to RN and Post -op Vital signs reviewed and stable  Post vital signs: stable  Last Vitals:  Vitals Value Taken Time  BP    Temp    Pulse 67 09/26/21 1036  Resp 17 09/26/21 1036  SpO2 100 % 09/26/21 1036  Vitals shown include unvalidated device data.  Last Pain:  Vitals:   09/26/21 0932  TempSrc: Temporal  PainSc: 0-No pain         Complications: No notable events documented.

## 2021-09-27 LAB — SURGICAL PATHOLOGY

## 2021-10-01 ENCOUNTER — Telehealth: Payer: Self-pay

## 2021-10-01 NOTE — Telephone Encounter (Signed)
Returned call to patient regarding increasing imodium to 2 pills every morning shortly after waking, and patient stated that she has been taking Imodium 2 tablets in the am and Imodium 1 tablet at lunch and continues to have diarrhea.  Patient asked " Is [name] turning my care over to [name] and not seeing me anymore?"  I explained to the patient that [name] was just returning a call to her to make her aware of pathology report after colonoscopy in hospital and that [name] would still be her gastroenterologist.    She would like [name] recommendations on how she should proceed with further treating her diarrhea.

## 2021-10-01 NOTE — Telephone Encounter (Signed)
-----   Message from Milus Banister, MD sent at 10/01/2021 11:13 AM EST ----- Increase her imodium to 2 pills every morning shortly after waking.   ----- Message ----- From: Stevan Born, CMA Sent: 10/01/2021  10:51 AM EST To: Milus Banister, MD  Patient advised of normal biopsies from colonoscopy.  Patient states she continues to have diarrhea since starting daily Imodium.  Patient states she had diarrhea 8 times in the night last night.  Please advise.

## 2021-10-02 MED ORDER — PREDNISONE 10 MG PO TABS: ORAL_TABLET | ORAL | 0 refills | Status: DC

## 2021-10-02 NOTE — Telephone Encounter (Signed)
Spoke to patient.  She took Imodium to 3 times daily yesterday and that helped.  Still suffering with watery diarrhea though she thinks that was better.  So far C. difficile is positive but colonoscopy without signs of C. difficile and normal biopsies.  More than 1 occasion.  She did have an elevated fecal calprotectin.  Previous stool studies negative for other infections.   Obviously we do not have a clear cause but the inflammatory marker was elevated so perhaps she has some sort of inflammatory enteritis, she could even have Crohn's disease I suppose that we have not seen it.   I am going to treat semiempirically with prednisone taper that was prescribed.  Meds ordered this encounter  Medications   predniSONE (DELTASONE) 10 MG tablet    Sig: Take 4 tablets (40 mg total) by mouth daily with breakfast for 3 days, THEN 3 tablets (30 mg total) daily with breakfast for 3 days, THEN 2 tablets (20 mg total) daily with breakfast for 6 days, THEN 1 tablet (10 mg total) daily with breakfast for 6 days, THEN 0.5 tablets (5 mg total) daily with breakfast for 6 days.    Dispense:  42 tablet    Refill:  0    She will continue with Imodium she can take to 3 times daily.  She needs a follow-up with me in 3 to 4 weeks we can use a work in spot if we need to.  Please call her back with that appointment information.

## 2021-10-02 NOTE — Addendum Note (Signed)
Addended by: Gatha Mayer on: 10/02/2021 08:28 AM   Modules accepted: Orders

## 2021-10-02 NOTE — Telephone Encounter (Signed)
Pt scheduled for an Follow Up appointment with Dr. Carlean Purl: on 10/23/2021 at 9:10: Pt made aware Pt verbalized understanding with all questions answered.

## 2021-10-02 NOTE — Addendum Note (Signed)
Addended by: Gatha Mayer on: 10/02/2021 08:34 AM   Modules accepted: Orders

## 2021-10-04 ENCOUNTER — Other Ambulatory Visit: Payer: Self-pay | Admitting: Family Medicine

## 2021-10-09 ENCOUNTER — Other Ambulatory Visit: Payer: Self-pay | Admitting: Family Medicine

## 2021-10-23 ENCOUNTER — Other Ambulatory Visit: Payer: Medicare Other

## 2021-10-23 ENCOUNTER — Encounter: Payer: Self-pay | Admitting: Internal Medicine

## 2021-10-23 ENCOUNTER — Ambulatory Visit (INDEPENDENT_AMBULATORY_CARE_PROVIDER_SITE_OTHER): Payer: Medicare Other | Admitting: Internal Medicine

## 2021-10-23 VITALS — BP 110/70 | HR 71 | Ht 61.0 in | Wt 112.0 lb

## 2021-10-23 DIAGNOSIS — Z8619 Personal history of other infectious and parasitic diseases: Secondary | ICD-10-CM | POA: Diagnosis not present

## 2021-10-23 DIAGNOSIS — K529 Noninfective gastroenteritis and colitis, unspecified: Secondary | ICD-10-CM

## 2021-10-23 DIAGNOSIS — K580 Irritable bowel syndrome with diarrhea: Secondary | ICD-10-CM | POA: Diagnosis not present

## 2021-10-23 MED ORDER — CHOLESTYRAMINE 4 G PO PACK: 4.0000 g | PACK | Freq: Every day | ORAL | 3 refills | Status: DC

## 2021-10-23 NOTE — Progress Notes (Signed)
? ?ETOLA MULL 81 y.o. [birth year] [id number] ? ?Assessment & Plan:  ? ?Encounter Diagnoses  ?Name Primary?  ? Chronic diarrhea Yes  ? Irritable bowel syndrome with diarrhea   ? History of Clostridioides difficile infection   ? ? ?She is improved.  It seems like she may be more near her baseline with IBS-D which has been in the background for a long time.  I think she did have C. difficile at 1 point but I do not think she has an active C. difficile illness. ? ?I am going to recheck a calprotectin.  I suppose she could have some sort of inflammatory bowel disease we have not found. ? ?Starting cholestyramine for symptomatic relief as well.  Follow-up plans pending the above. ? ?Orders Placed This Encounter  ?Procedures  ? Calprotectin, Fecal  ? ?Meds ordered this encounter  ?Medications  ? cholestyramine (QUESTRAN) 4 g packet  ?  Sig: Take 1 packet (4 g total) by mouth daily with supper.  ?  Dispense:  90 each  ?  Refill:  3  ? ?CC: [name], MD ? ? ? ? ?Subjective:  ? ?Chief Complaint: Diarrhea ? ?HPI ?Alveda is an 81 year old white woman whose had a lot of problems with diarrhea suspected in part to be due to C. difficile plus or minus IBS over the past several months and has been hospitalized in December 2022 with dehydration and severe diarrhea.   Recall that she has had positive C. difficile testing as well as an elevated fecal calprotectin after an office visit with me in early February with persistent issues.  At that point I was puzzled because she never really seem to respond to vancomycin at all and she had some response to Dificid but it was short-lived.  She was treated with Imodium and because of the persistent symptoms in the clinical conundrum had a colonoscopy in the middle of February by Dr. Ardis Hughs.  It was normal.  See below.  She was treated with a course of prednisone and is improved but still has issues with several soft stools a day.  They occur in the morning after breakfast  sometimes after supper and then overnight at times.  There is still some incontinence butIt is rare fortunately.  She is eating okay and maintaining her weight.  She is not using any Imodium.  No abdominal cramps. ? ?Wt Readings from Last 3 Encounters:  ?10/23/21 112 lb (50.8 kg)  ?09/26/21 111 lb 15.9 oz (50.8 kg)  ?09/12/21 112 lb (50.8 kg)  ? ?Recent studies ?Colonoscopy 09/26/2021 ?The examined portion of the ileum was normal. ?- Diverticulosis in the entire examined colon. ?- The examination was otherwise normal on direct and retroflexion views. No ?pseudomembranes. ?- Biopsies were taken with a cold forceps from the entire colon to check for microscopic  ?changes.  NORMAL ? ?Fecal calprotectin 09/12/2021 305 normal 0-1 20 ?C. difficile toxin by PCR + 09/12/2021 ?Allergies  ?Allergen Reactions  ? Codeine Nausea Only  ? ?Current Meds  ?Medication Sig  ? acetaminophen (TYLENOL) 500 MG tablet Take 1,000 mg by mouth at bedtime as needed for moderate pain (Sleep).  ? albuterol (VENTOLIN HFA) 108 (90 Base) MCG/ACT inhaler Inhale 2 puffs into the lungs every 6 (six) hours as needed for wheezing or shortness of breath (or cough). (Patient taking differently: Inhale 2 puffs into the lungs every 6 (six) hours as needed (or cough/allergies).)  ? ALPRAZolam (XANAX) 0.25 MG tablet TAKE 1 TABLET (0.25 MG  TOTAL) BY MOUTH DAILY AS NEEDED. FOR ANXIETY  ? Calcium Carbonate-Vitamin D (CALCIUM + D PO) Take 600 mg by mouth daily.  ? diltiazem (CARDIZEM CD) 240 MG 24 hr capsule TAKE 1 CAPSULE BY MOUTH EVERY DAY (Patient taking differently: 240 mg daily.)  ? dorzolamide-timolol (COSOPT) 22.3-6.8 MG/ML ophthalmic solution Place 1 drop into both eyes 2 (two) times daily.  ? escitalopram (LEXAPRO) 20 MG tablet TAKE 1 TABLET BY MOUTH EVERY DAY (Patient taking differently: Take 20 mg by mouth daily.)  ? hydrochlorothiazide (MICROZIDE) 12.5 MG capsule Take 12.5 mg by mouth daily.  ? melatonin 5 MG TABS Take 5 mg by mouth at bedtime as needed  (Sleep).  ? pantoprazole (PROTONIX) 20 MG tablet TAKE 1 TABLET BY MOUTH TWICE A DAY  ? rosuvastatin (CRESTOR) 10 MG tablet Take 1 tablet (10 mg total) by mouth once a week.  ? valsartan (DIOVAN) 320 MG tablet TAKE 1 TABLET BY MOUTH EVERY DAY  ? vitamin B-12 (CYANOCOBALAMIN) 1000 MCG tablet Take 1,000 mcg by mouth daily.  ? ?Past Medical History:  ?Diagnosis Date  ? Adenomatous colon polyp 08/11/2004  ? Anxiety   ? Arthritis   ? Benign esophageal stricture 10/14/2012  ? C. difficile diarrhea   ? CERUMEN IMPACTION, BILATERAL 12/13/2007  ? CERVICAL RADICULOPATHY 11/05/2009  ? Chronic kidney disease   ? kidney stones  ? DEPRESSION 03/11/2007  ? DIVERTICULOSIS, COLON 03/11/2007  ? Esophagitis, reflux 10/14/2012  ? GERD (gastroesophageal reflux disease) 10/14/2012  ? Glaucoma   ? GLAUCOMA ASSOCIATED W/UNSPEC OCULAR DISORDER 11/05/2009  ? Hearing loss   ? HEMORRHOIDS, EXTERNAL W/O COMPLICATION 17/51/0258  ? Qualifier: Diagnosis of  By: Jimmye Norman LPN, [place] Cunas   ? HYPERLIPIDEMIA 09/22/2007  ? HYPERTENSION 03/11/2007  ? Kidney stone   ? Osteopenia 02/09/2015  ? T score -1.4 FRAX 7.3%/0.6%  ? Rectocele   ? Rosacea 03/11/2007  ? Tachycardia   ? ?Past Surgical History:  ?Procedure Laterality Date  ? ANTERIOR CERVICAL DECOMP/DISCECTOMY FUSION  08/14/2011  ? Procedure: ANTERIOR CERVICAL DECOMPRESSION/DISCECTOMY FUSION 1 LEVEL/HARDWARE REMOVAL;  Surgeon: [name], MD;  Location: [facility];  Service: Neurosurgery;  Laterality: N/A;  Cervical Six-Seven anterior cervical decompression with fusion interbody prothesis plating and bonegraft with removal of hardware of Cervical Four to Cervical Six   ? BIOPSY  09/26/2021  ? Procedure: BIOPSY;  Surgeon: Milus Banister, MD;  Location: [facility];  Service: Endoscopy;;  ? CERVICAL FUSION    ? C4-6  ? COLONOSCOPY    ? multiple  ? COLONOSCOPY N/A 09/26/2021  ? Procedure: COLONOSCOPY;  Surgeon: Milus Banister, MD;  Location: [facility];  Service: Endoscopy;  Laterality: N/A;  ?  ESOPHAGOGASTRODUODENOSCOPY  10/14/2012  ? Dr. [name]  ? KIDNEY SURGERY    ? age 10  ? TONSILLECTOMY    ? ?Social History  ? ?Social History Narrative  ? Lives alone. Widowed. 2 living children (lost son to MI 2019 and daughter to pancreatic cancer 2020, another son MI 2021) and 7 grandchildren. 3 greatgrandkids  ?   ? Retired from [employer] after 5 years and later took care of children.   ?   ? Hobbies: time with grandkids, shopping, go out to eat. Travels once a year with girlfriends.   ? ?family history includes Cerebral palsy in her sister; Diabetes in her maternal grandmother; Glaucoma in her mother; Heart attack in her son and son; Heart disease in her father; Hypertension in her father; Lung cancer in her father;  Mental illness in her mother; Pancreatic cancer in her daughter; Stroke in her paternal aunt, paternal aunt, paternal uncle, paternal uncle, paternal uncle, and sister. ? ? ?Review of Systems ?As per HPI ? ?Objective:  ? Physical Exam ?BP 110/70   Pulse 71   Ht '5\' 1"'$  (1.549 m)   Wt 112 lb (50.8 kg)   LMP 08/12/1991   BMI 21.16 kg/m?  ?Abd soft NT BS + ? ?

## 2021-10-23 NOTE — Patient Instructions (Addendum)
Your provider has requested that you go to the basement level for lab work before leaving today. Press "B" on the elevator. The lab is located at the first door on the left as you exit the elevator. ? ?Due to recent changes in healthcare laws, you may see the results of your imaging and laboratory studies on MyChart before your provider has had a chance to review them.  We understand that in some cases there may be results that are confusing or concerning to you. Not all laboratory results come back in the same time frame and the provider may be waiting for multiple results in order to interpret others.  Please give [place] 48 hours in order for your provider to thoroughly review all the results before contacting the office for clarification of your results.  ? ?We have sent the following medications to your pharmacy for you to pick up at your convenience: ?Cholestyramine, start after collecting the stool sample ? ?I appreciate the opportunity to care for you. ?[name], MD, [facility] ?

## 2021-10-27 LAB — CALPROTECTIN, FECAL: Calprotectin, Fecal: 26 ug/g (ref 0–120)

## 2021-10-28 ENCOUNTER — Other Ambulatory Visit: Payer: Self-pay | Admitting: Internal Medicine

## 2021-12-27 ENCOUNTER — Telehealth: Payer: Self-pay | Admitting: Family Medicine

## 2021-12-27 NOTE — Telephone Encounter (Signed)
Copied from [place] [phone]. Topic: Medicare AWV >> Dec 27, 2021 10:40 AM Harris-Coley, Hannah Beat wrote: Reason for CRM: Left message for patient to schedule Annual Wellness Visit.  Please schedule with Nurse Health Advisor Charlott Rakes, RN at [facility].  Please call [phone] ask for [facility]

## 2021-12-31 ENCOUNTER — Other Ambulatory Visit: Payer: Self-pay | Admitting: Family Medicine

## 2022-02-12 ENCOUNTER — Telehealth: Payer: Self-pay | Admitting: Internal Medicine

## 2022-02-13 ENCOUNTER — Other Ambulatory Visit: Payer: Self-pay

## 2022-02-13 ENCOUNTER — Ambulatory Visit (INDEPENDENT_AMBULATORY_CARE_PROVIDER_SITE_OTHER): Payer: Medicare Other | Admitting: Physician Assistant

## 2022-02-13 ENCOUNTER — Encounter: Payer: Self-pay | Admitting: Physician Assistant

## 2022-02-13 ENCOUNTER — Other Ambulatory Visit (INDEPENDENT_AMBULATORY_CARE_PROVIDER_SITE_OTHER): Payer: Medicare Other

## 2022-02-13 ENCOUNTER — Ambulatory Visit (HOSPITAL_COMMUNITY)
Admission: RE | Admit: 2022-02-13 | Discharge: 2022-02-13 | Disposition: A | Discharge status: Home / Self Care | Payer: Medicare Other | Source: Ambulatory Visit | Attending: Physician Assistant | Admitting: Physician Assistant

## 2022-02-13 VITALS — BP 110/60 | HR 73 | Ht 61.0 in | Wt 113.0 lb

## 2022-02-13 DIAGNOSIS — E876 Hypokalemia: Secondary | ICD-10-CM

## 2022-02-13 DIAGNOSIS — K5900 Constipation, unspecified: Secondary | ICD-10-CM | POA: Diagnosis present

## 2022-02-13 DIAGNOSIS — R1032 Left lower quadrant pain: Secondary | ICD-10-CM | POA: Insufficient documentation

## 2022-02-13 DIAGNOSIS — R197 Diarrhea, unspecified: Secondary | ICD-10-CM

## 2022-02-13 DIAGNOSIS — K625 Hemorrhage of anus and rectum: Secondary | ICD-10-CM | POA: Diagnosis not present

## 2022-02-13 DIAGNOSIS — R1031 Right lower quadrant pain: Secondary | ICD-10-CM | POA: Diagnosis present

## 2022-02-13 LAB — COMPREHENSIVE METABOLIC PANEL
ALT: 10 U/L (ref 0–35)
AST: 12 U/L (ref 0–37)
Albumin: 4.3 g/dL (ref 3.5–5.2)
Alkaline Phosphatase: 55 U/L (ref 39–117)
BUN: 13 mg/dL (ref 6–23)
CO2: 32 mEq/L (ref 19–32)
Calcium: 9.8 mg/dL (ref 8.4–10.5)
Chloride: 100 mEq/L (ref 96–112)
Creatinine, Ser: 0.78 mg/dL (ref 0.40–1.20)
GFR: 70.39 mL/min (ref >60.00)
Glucose, Bld: 114 mg/dL — ABNORMAL HIGH (ref 70–99)
Potassium: 3.4 mEq/L — ABNORMAL LOW (ref 3.5–5.1)
Sodium: 141 mEq/L (ref 135–145)
Total Bilirubin: 1 mg/dL (ref 0.2–1.2)
Total Protein: 7.1 g/dL (ref 6.0–8.3)

## 2022-02-13 LAB — CBC
HCT: 38.8 % (ref 36.0–46.0)
Hemoglobin: 12.9 g/dL (ref 12.0–15.0)
MCHC: 33.3 g/dL (ref 30.0–36.0)
MCV: 94 fl (ref 78.0–100.0)
Platelets: 298 10*3/uL (ref 150.0–400.0)
RBC: 4.13 Mil/uL (ref 3.87–5.11)
RDW: 12.6 % (ref 11.5–15.5)
WBC: 8.5 10*3/uL (ref 4.0–10.5)

## 2022-02-13 LAB — MAGNESIUM: Magnesium: 1.7 mg/dL (ref 1.5–2.5)

## 2022-02-13 MED ORDER — IOHEXOL 300 MG/ML  SOLN
100.0000 mL | Freq: Once | INTRAMUSCULAR | Status: AC | PRN
  Administered 2022-02-13: 100 mL via INTRAVENOUS

## 2022-02-13 MED ORDER — HYDROCORTISONE (PERIANAL) 2.5 % EX CREA: 1.0000 | TOPICAL_CREAM | Freq: Two times a day (BID) | CUTANEOUS | 2 refills | Status: DC

## 2022-02-13 MED ORDER — AMOXICILLIN-POT CLAVULANATE 875-125 MG PO TABS: 1.0000 | ORAL_TABLET | Freq: Two times a day (BID) | ORAL | 0 refills | Status: AC

## 2022-02-13 NOTE — Patient Instructions (Addendum)
Take IBGARD daily Please go to the ER if you have any severe AB pain, unable to hold down food/water, blood in stool or vomit, chest pain, shortness of breath, or any worsening symptoms.   We have sent the following medications to your pharmacy for you to pick up at your convenience: hydrocortisone  Your provider has requested that you go to the basement level for lab work before leaving today. Press "B" on the elevator. The lab is located at the first door on the left as you exit the elevator.  You have been scheduled for a CT scan of the abdomen and pelvis at [facility], 1st floor Radiology. You are scheduled on today  at 1200 pm. You should arrive 15 minutes prior to your appointment time for registration.  Please pick up 2 bottles of contrast from [place] at least 3 days prior to your scan. The solution may taste better if refrigerated, but do NOT add ice or any other liquid to this solution. Shake well before drinking.   Please follow the written instructions below on the day of your exam:   1) Drink 1 bottle of contrast @ 1000 pm (2 hours prior to your exam)  Remember to shake well before drinking and do NOT pour over ice.     Drink 1 bottle of contrast @ 1100 pm (1 hour prior to your exam)   You may take any medications as prescribed with a small amount of water, if necessary. If you take any of the following medications: METFORMIN, GLUCOPHAGE, GLUCOVANCE, AVANDAMET, RIOMET, FORTAMET, [place] MET, JANUMET, GLUMETZA or METAGLIP, you MAY be asked to HOLD this medication 48 hours AFTER the exam.   The purpose of you drinking the oral contrast is to aid in the visualization of your intestinal tract. The contrast solution may cause some diarrhea. Depending on your individual set of symptoms, you may also receive an intravenous injection of x-ray contrast/dye. Plan on being at [facility] for 45 minutes or longer, depending on the type of exam you are having performed.   If you have  any questions regarding your exam or if you need to reschedule, you may call [facility] at [phone] between the hours of 8:00 am and 5:00 pm, Monday-Friday.     About Hemorrhoids  Hemorrhoids are swollen veins in the lower rectum and anus.  Also called piles, hemorrhoids are a common problem.  Hemorrhoids may be internal (inside the rectum) or external (around the anus).  Internal Hemorrhoids  Internal hemorrhoids are often painless, but they rarely cause bleeding.  The internal veins may stretch and fall down (prolapse) through the anus to the outside of the body.  The veins may then become irritated and painful.  External Hemorrhoids  External hemorrhoids can be easily seen or felt around the anal opening.  They are under the skin around the anus.  When the swollen veins are scratched or broken by straining, rubbing or wiping they sometimes bleed.  How Hemorrhoids Occur  Veins in the rectum and around the anus tend to swell under pressure.  Hemorrhoids can result from increased pressure in the veins of your anus or rectum.  Some sources of pressure are:  Straining to have a bowel movement because of constipation Waiting too long to have a bowel movement Coughing and sneezing often Sitting for extended periods of time, including on the toilet Diarrhea Obesity Trauma or injury to the anus Some liver diseases Stress Family history of hemorrhoids Pregnancy  Pregnant women should try to avoid becoming constipated, because they are more likely to have hemorrhoids during pregnancy.  In the last trimester of pregnancy, the enlarged uterus may press on blood vessels and causes hemorrhoids.  In addition, the strain of childbirth sometimes causes hemorrhoids after the birth.  Symptoms of Hemorrhoids  Some symptoms of hemorrhoids include: Swelling and/or a tender lump around the anus Itching, mild burning and bleeding around the anus Painful bowel movements with or without  constipation Bright red blood covering the stool, on toilet paper or in the toilet bowel.   Symptoms usually go away within a few days.  Always talk to your doctor about any bleeding to make sure it is not from some other causes.  Diagnosing and Treating Hemorrhoids  Diagnosis is made by an examination by your healthcare provider.  Special test can be performed by your doctor.    Most cases of hemorrhoids can be treated with: High-fiber diet: Eat more high-fiber foods, which help prevent constipation.  Ask for more detailed fiber information on types and sources of fiber from your healthcare provider. Fluids: Drink plenty of water.  This helps soften bowel movements so they are easier to pass. Sitz baths and cold packs: Sitting in lukewarm water two or three times a day for 15 minutes cleases the anal area and may relieve discomfort.  If the water is too hot, swelling around the anus will get worse.  Placing a cloth-covered ice pack on the anus for ten minutes four times a day can also help reduce selling.  Gently pushing a prolapsed hemorrhoid back inside after the bath or ice pack can be helpful. Medications: For mild discomfort, your healthcare provider may suggest over-the-counter pain medication or prescribe a cream or ointment for topical use.  The cream may contain witch hazel, zinc oxide or petroleum jelly.  Medicated suppositories are also a treatment option.  Always consult your doctor before applying medications or creams. Procedures and surgeries: There are also a number of procedures and surgeries to shrink or remove hemorrhoids in more serious cases.  Talk to your physician about these options.  You can often prevent hemorrhoids or keep them from becoming worse by maintaining a healthy lifestyle.  Eat a fiber-rich diet of fruits, vegetables and whole grains.  Also, drink plenty of water and exercise regularly.   2007, [employer] Doc.30

## 2022-02-13 NOTE — Progress Notes (Signed)
02/13/2022 [name] [id number] 26-Aug-1940  Referring provider: [name], MD Primary GI doctor: Dr. Carlean Purl  ASSESSMENT AND PLAN:   Acute abdominal pain with episode of constipation, nausea but no vomiting. No fever, chills.  In the office she is having peritoneal signs with rebound tenderness,  no dizziness, not in acute stress. Has weakness but denies SOB, CP.  Will proceed with stat labs and stat CT AB and pelvis rule out diverticulitis, perforation, obstruction, colitis ER precautions given.  Could be constipation/IBS/pelvic floor component if negative.  07/2021 normal CT AB and pelvis other than diverticulosis 09/2021 unremarkable colonoscopy Remotely elevated fecal cal at 300 with repeat 26, CRP was 21 in Dec.  Normal thyroid, last A1C 6.1 in Dec/2022 Negative celiac 09/2020 -     Comprehensive metabolic panel; Future -     CBC; Future -     CT ABDOMEN PELVIS W CONTRAST; Future  Constipation, unspecified constipation type -     Comprehensive metabolic panel; Future -     CBC; Future -     CT ABDOMEN PELVIS W CONTRAST; Future  Rectal bleeding Negative hemoccult in the office Some external erythema, will send in hydrocotisone, likely from internal hemorrhoids.   Chronic diarrhea has resolved, likely pelvic floor component 07/2021 normal CT AB and pelvis 09/2021 unremarkable colonoscopy Remotely elevated fecal cal at 300 with repeat 26, CRP was 21 in Dec.  Normal thyroid, last A1C 6.1 in Dec/2022 Negative celiac 09/2020    History of Present Illness:  81 y.o. female  with a past medical history of hypertension, diabetes, SVT, HOH, personal history of colonic polyps, IBS diarrhea predominant, GERD and history of C. difficile and others listed below, returns to clinic today for evaluation of chronic diarrhea that in the past has required hospitalization for dehydration  10/23/2021 office visit Dr. Carlean Purl for chronic diarrhea, never got bile acid  sequetrant.  Tested positive for C. difficile with elevated fecal calprotectin in early February however did not respond to vancomycin short-lived response to Dificid 09/2021 colonoscopy with Dr. Ardis Hughs for chronic diarrhea which was unremarkable, negative for microscopic colitis  She states in the last month bowels have not been/dealing with it, she was having one BM daily, formed occ loose. She takes IBGard when she goes out to eat. She will take imodium if she has more than 2-3 BM's.   She was out of town Friday, came back Friday night. BM prior to this was Thursday.   Saturday morning she had AB pain, lower AB felt like cutting, and had need to have BM but could not go.  She had waves of nausea but no vomiting. No fever, chills.  Monday she took 2 pills to help with BM unknown and took suppository, then on Tuesday she had cramps with very small volume Hard stool with some soft pieces.  She never felt cleaned out until today, she felt something protruding from her rectum that she would push back in. With some rectal pain.  Small volume blood in the stool.  No new medications, no supplements.  Colon 12/2020 showed decreased rectal tone and hemorrhoids.    Recent studies Colonoscopy 09/26/2021 The examined portion of the ileum was normal. - Diverticulosis in the entire examined colon. - The examination was otherwise normal on direct and retroflexion views. No pseudomembranes. - Biopsies were taken with a cold forceps from the entire colon to check for microscopic  changes.  NORMAL  07/22/2021 CT abdomen pelvis with contrast for  diarrhea and abdominal pain Diffuse colonic diverticulosis without diverticulitis no inflammatory changes suggestive of C. difficile, bilateral nonobstructing renal calculi measuring up to 10 mm, trace bilateral pleural effusions, aortic atherosclerosis.  Unremarkable liver, gallstones, pancreas   Fecal calprotectin 09/12/2021 305 normal  C. difficile toxin by PCR +  09/12/2021  Current Medications:    Current Outpatient Medications (Cardiovascular):    diltiazem (CARDIZEM CD) 240 MG 24 hr capsule, TAKE 1 CAPSULE BY MOUTH EVERY DAY (Patient taking differently: 240 mg daily.)   hydrochlorothiazide (MICROZIDE) 12.5 MG capsule, Take 12.5 mg by mouth daily.   rosuvastatin (CRESTOR) 10 MG tablet, Take 1 tablet (10 mg total) by mouth once a week.   valsartan (DIOVAN) 320 MG tablet, TAKE 1 TABLET BY MOUTH EVERY DAY  Current Outpatient Medications (Respiratory):    albuterol (VENTOLIN HFA) 108 (90 Base) MCG/ACT inhaler, Inhale 2 puffs into the lungs every 6 (six) hours as needed for wheezing or shortness of breath (or cough). (Patient not taking: Reported on 02/13/2022)  Current Outpatient Medications (Analgesics):    acetaminophen (TYLENOL) 500 MG tablet, Take 1,000 mg by mouth at bedtime as needed for moderate pain (Sleep).  Current Outpatient Medications (Hematological):    vitamin B-12 (CYANOCOBALAMIN) 1000 MCG tablet, Take 1,000 mcg by mouth daily.  Current Outpatient Medications (Other):    ALPRAZolam (XANAX) 0.25 MG tablet, TAKE 1 TABLET (0.25 MG TOTAL) BY MOUTH DAILY AS NEEDED. FOR ANXIETY   Calcium Carbonate-Vitamin D (CALCIUM + D PO), Take 600 mg by mouth daily.   dorzolamide-timolol (COSOPT) 22.3-6.8 MG/ML ophthalmic solution, Place 1 drop into both eyes 2 (two) times daily.   escitalopram (LEXAPRO) 20 MG tablet, TAKE 1 TABLET BY MOUTH EVERY DAY (Patient taking differently: Take 20 mg by mouth daily.)   melatonin 5 MG TABS, Take 5 mg by mouth at bedtime as needed (Sleep).   pantoprazole (PROTONIX) 20 MG tablet, TAKE 1 TABLET BY MOUTH EVERY DAY  Surgical History:  She  has a past surgical history that includes Kidney surgery; Cervical fusion; Tonsillectomy; Colonoscopy; Anterior cervical decomp/discectomy fusion (08/14/2011); Esophagogastroduodenoscopy (10/14/2012); Colonoscopy (N/A, 09/26/2021); and biopsy (09/26/2021). Family History:  Her family history  includes Cerebral palsy in her sister; Diabetes in her maternal grandmother; Glaucoma in her mother; Heart attack in her son and son; Heart disease in her father; Hypertension in her father; Lung cancer in her father; Mental illness in her mother; Pancreatic cancer in her daughter; Stroke in her paternal aunt, paternal aunt, paternal uncle, paternal uncle, paternal uncle, and sister. Social History:   reports that she has never smoked. She has never used smokeless tobacco. She reports that she does not drink alcohol and does not use drugs.  Current Medications, Allergies, Past Medical History, Past Surgical History, Family History and Social History were reviewed in [employer] record.  Physical Exam: BP 110/60   Pulse 73   Ht '5\' 1"'$  (1.549 m)   Wt 113 lb (51.3 kg)   LMP 08/12/1991   BMI 21.35 kg/m  General:   Pleasant, well developed female in no acute distress Heart : Regular rate and rhythm; no murmurs Pulm: Clear anteriorly; no wheezing Abdomen:  Soft, Obese AB, Sluggish bowel sounds. marked tenderness in the lower abdomen. With guarding and With rebound, No organomegaly appreciated. Rectal: External rectal tags some erythema on rectum but no fissures,, decreased rectal tone with cavernous rectum, appreciated internal hemorrhoids, tender, no masses, light brown stool, hemoccult Negative Extremities:  without  edema. Neurologic:  Alert and  oriented x4;  No focal deficits.  Psych:  Cooperative. Normal mood and affect.   Vladimir Crofts, PA-C 02/13/22

## 2022-02-17 ENCOUNTER — Ambulatory Visit: Payer: Medicare Other | Admitting: Family Medicine

## 2022-02-20 ENCOUNTER — Telehealth: Payer: Self-pay | Admitting: Physician Assistant

## 2022-02-20 NOTE — Telephone Encounter (Signed)
Spoke with patient regarding stool sample. Patient has been advised to complete course of antibiotics & then come in for stool samples if she is still experiencing diarrhea. Patient verbalized all understanding, no further questions.

## 2022-02-20 NOTE — Telephone Encounter (Signed)
Patient saw [name] about a week ago and she prescribed amoxicillin.  She was called later that day and told she needed to do a stool sample.  Her question is does she do the stool sample now or after she's completed the amoxicillin.  Please call patient and advise.  Thank you.

## 2022-02-26 ENCOUNTER — Other Ambulatory Visit: Payer: Self-pay | Admitting: Family Medicine

## 2022-02-26 ENCOUNTER — Other Ambulatory Visit: Payer: Self-pay | Admitting: Internal Medicine

## 2022-02-27 ENCOUNTER — Other Ambulatory Visit: Payer: Medicare Other

## 2022-02-27 DIAGNOSIS — R197 Diarrhea, unspecified: Secondary | ICD-10-CM

## 2022-03-01 LAB — GI PROFILE, STOOL, PCR
Adenovirus F 40/41: NOT DETECTED
Astrovirus: NOT DETECTED
C difficile toxin A/B: DETECTED — AB
Campylobacter: NOT DETECTED
Cryptosporidium: NOT DETECTED
Cyclospora cayetanensis: NOT DETECTED
Entamoeba histolytica: NOT DETECTED
Enteroaggregative E coli: NOT DETECTED
Enteropathogenic E coli: NOT DETECTED
Enterotoxigenic E coli: NOT DETECTED
Giardia lamblia: NOT DETECTED
Norovirus GI/GII: DETECTED — AB
Plesiomonas shigelloides: NOT DETECTED
Rotavirus A: NOT DETECTED
Salmonella: NOT DETECTED
Sapovirus: NOT DETECTED
Shiga-toxin-producing E coli: NOT DETECTED
Shigella/Enteroinvasive E coli: NOT DETECTED
Vibrio cholerae: NOT DETECTED
Vibrio: NOT DETECTED
Yersinia enterocolitica: NOT DETECTED

## 2022-03-01 LAB — CLOSTRIDIUM DIFFICILE BY PCR: Toxigenic C. Difficile by PCR: POSITIVE — AB

## 2022-03-02 ENCOUNTER — Other Ambulatory Visit: Payer: Self-pay | Admitting: Physician Assistant

## 2022-03-02 MED ORDER — VANCOMYCIN HCL 125 MG PO CAPS: ORAL_CAPSULE | ORAL | 0 refills | Status: DC

## 2022-03-03 ENCOUNTER — Other Ambulatory Visit: Payer: Self-pay

## 2022-03-03 MED ORDER — DIFICID 200 MG PO TABS: 200.0000 mg | ORAL_TABLET | Freq: Two times a day (BID) | ORAL | 0 refills | Status: AC

## 2022-03-04 ENCOUNTER — Telehealth: Payer: Self-pay | Admitting: Physician Assistant

## 2022-03-04 NOTE — Telephone Encounter (Signed)
Inbound call from patient stating [employer] was going to be sending a fax to our office to be filled out. Patient stated there is a part In the paperwork that she needs to complete and is requesting a call when it is received.

## 2022-03-05 NOTE — Telephone Encounter (Signed)
Patient calling,requested a call back today.

## 2022-03-05 NOTE — Telephone Encounter (Signed)
Received fax from [employer]. Placed on PA's desk for signature.

## 2022-03-05 NOTE — Telephone Encounter (Signed)
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 I've called and spoken with their program, and they are sending a new fax over. Once received, both patient & provider will need to fill out form, and then fax to [phone].

## 2022-03-05 NOTE — Telephone Encounter (Signed)
Inbound call from patient inquiring if we have received fax from [place]. Advised patient we have yet to receive fax but someone will be giving her a call when we receive paperwork. Patient verbalized understanding. Please give a call to advise.  Thank you

## 2022-03-06 NOTE — Telephone Encounter (Signed)
Spoke with patient & she is going to come by the office today to fill out form.

## 2022-03-06 NOTE — Telephone Encounter (Signed)
Patient completed form & it has been faxed to [employer].

## 2022-03-07 NOTE — Telephone Encounter (Signed)
Patient returned your call, I advised patient that she will be getting medication tomorrow. Patient stated she understood. If there is any other information patient needs to know, please give her a call back.

## 2022-03-07 NOTE — Telephone Encounter (Signed)
Spoke with [employer] & they stated patient was approved for dificid assistance and that form would need to be faxed to pharmacist at [phone]. Patient should receive medication tomorrow. Form faxed. Left message for patient to call back.

## 2022-03-10 NOTE — Telephone Encounter (Signed)
Patient called in stating she didn't receive the medication on Saturday. I called and spoke with pharmacist with [facility] who stated the medication is scheduled to ship out today & arrive by tomorrow. Pt is aware, and notified to call back if she still doesn't receive it.

## 2022-03-25 ENCOUNTER — Other Ambulatory Visit: Payer: Self-pay | Admitting: Internal Medicine

## 2022-03-25 ENCOUNTER — Other Ambulatory Visit: Payer: Self-pay | Admitting: Family Medicine

## 2022-03-26 ENCOUNTER — Other Ambulatory Visit: Payer: Self-pay | Admitting: *Deleted

## 2022-03-26 NOTE — Patient Outreach (Signed)
  Care Coordination   03/26/2022 Name: [name] MRN: [id number] DOB: 1941-05-11   Care Coordination Outreach Attempts:  An unsuccessful telephone outreach was attempted today to offer the patient information about available care coordination services as a benefit of their health plan.   Follow Up Plan:  Additional outreach attempts will be made to offer the patient care coordination information and services.   Encounter Outcome:  No Answer  Care Coordination Interventions Activated:  No   Care Coordination Interventions:  No, not indicated    Raina Mina, RN Care Management Coordinator [place] Office [phone]

## 2022-04-03 ENCOUNTER — Other Ambulatory Visit: Payer: Self-pay | Admitting: *Deleted

## 2022-04-03 ENCOUNTER — Encounter: Payer: Self-pay | Admitting: *Deleted

## 2022-04-03 NOTE — Patient Outreach (Signed)
  Care Coordination   Initial Visit Note   04/03/2022 Name: [name] MRN: [id number] DOB: 09-18-1940  [name] is a 81 y.o. year old female who sees [name], Courtney Mars, [name] for primary care. I spoke with  [name] by phone today  What matters to the patients health and wellness today?  No needs    Goals Addressed               This Visit's Progress     No needs (pt-stated)        Care Coordination Interventions: Reviewed medications with patient and discussed purpose of all medications Reviewed scheduled/upcoming provider appointments including pending appointments and requested pt contact her provider's office to schedule her AWV (receptive). Screening for signs and symptoms of depression related to chronic disease state  Assessed social determinant of health barriers          SDOH assessments and interventions completed:  Yes  SDOH Interventions Today    Flowsheet Row Most Recent Value  SDOH Interventions   Food Insecurity Interventions Intervention Not Indicated  Housing Interventions Intervention Not Indicated        Care Coordination Interventions Activated:  Yes  Care Coordination Interventions:  Yes, provided   Follow up plan: No further intervention required.   Encounter Outcome:  Pt. Visit Completed   Courtney Mina, RN Care Management Coordinator [place] Office [phone]

## 2022-04-03 NOTE — Patient Instructions (Signed)
Visit Information  Thank you for taking time to visit with me today. Please don't hesitate to contact me if I can be of assistance to you.   Following are the goals we discussed today:   Goals Addressed               This Visit's Progress     No needs (pt-stated)        Care Coordination Interventions: Reviewed medications with patient and discussed purpose of all medications Reviewed scheduled/upcoming provider appointments including pending appointments and requested pt contact her provider's office to schedule her AWV (receptive). Screening for signs and symptoms of depression related to chronic disease state  Assessed social determinant of health barriers          Please call the care guide team at [phone] if you need to cancel or reschedule your appointment.   If you are experiencing a Mental Health or [place] or need someone to talk to, please call the Suicide and Crisis Lifeline: 988  Patient verbalizes understanding of instructions and care plan provided today and agrees to view in [place]. Active MyChart status and patient understanding of how to access instructions and care plan via MyChart confirmed with patient.     No Follow up needed  Raina Mina, RN Care Management Coordinator [place] Office [phone]

## 2022-04-05 ENCOUNTER — Other Ambulatory Visit: Payer: Self-pay | Admitting: Family Medicine

## 2022-04-22 ENCOUNTER — Ambulatory Visit (INDEPENDENT_AMBULATORY_CARE_PROVIDER_SITE_OTHER): Payer: Medicare Other | Admitting: Internal Medicine

## 2022-04-22 ENCOUNTER — Encounter: Payer: Self-pay | Admitting: Internal Medicine

## 2022-04-22 VITALS — BP 124/66 | HR 56 | Ht 61.5 in | Wt 116.0 lb

## 2022-04-22 DIAGNOSIS — K580 Irritable bowel syndrome with diarrhea: Secondary | ICD-10-CM

## 2022-04-22 DIAGNOSIS — A0472 Enterocolitis due to Clostridium difficile, not specified as recurrent: Secondary | ICD-10-CM

## 2022-04-22 DIAGNOSIS — N816 Rectocele: Secondary | ICD-10-CM | POA: Diagnosis not present

## 2022-04-22 DIAGNOSIS — K641 Second degree hemorrhoids: Secondary | ICD-10-CM

## 2022-04-22 DIAGNOSIS — K648 Other hemorrhoids: Secondary | ICD-10-CM | POA: Diagnosis not present

## 2022-04-22 NOTE — Patient Instructions (Signed)
Great to see you today.   Avoid straining when you go to the bathroom. Use a step stool as instructed.  Due to recent changes in healthcare laws, you may see the results of your imaging and laboratory studies on MyChart before your provider has had a chance to review them.  We understand that in some cases there may be results that are confusing or concerning to you. Not all laboratory results come back in the same time frame and the provider may be waiting for multiple results in order to interpret others.  Please give [place] 48 hours in order for your provider to thoroughly review all the results before contacting the office for clarification of your results.    I appreciate the opportunity to care for you. [name], MD, [facility]

## 2022-04-22 NOTE — Progress Notes (Signed)
[name] 81 y.o. [birth year] [id number]  Assessment & Plan:   Encounter Diagnoses  Name Primary?   C. difficile diarrhea Yes   Irritable bowel syndrome with diarrhea    Prolapsed internal hemorrhoids vs rectal prolapse    Rectocele    She has recovered from her second episode of C. difficile diarrhea.  IBS appears to be under control as well or at least stable.  Sometimes she still has some urge incontinence.  I am most suspicious of prolapsed internal hemorrhoids but cannot rule out rectal prolapse.  I think she probably has significant pelvic organ prolapse as well.  She wants to observe and continue to manually reduce this prolapse as needed.  I educated and cautioned her about straining to stool and recommended a stepstool/squatty potty arrangement when defecating.  We talked about gynecology follow-up and she was not inclined.  Was last seen by Dr. Phineas Montoya and rectocele was noted, this was 2017 and she did not wish to pursue any treatment.  He had noted a second-degree rectocele then no significant cystocele and that the cervix appeared well supported.  She will return as needed. Subjective:   Chief Complaint: Follow-up of C. difficile  Gastrointestinal problems summary:  IBS-diarrhea predominant-multiple colonoscopies without other causes, urgent fecal incontinence at times has been managed with Imodium mostly last colonoscopy 09/30/2021 with diverticulosis, normal random biopsies  C. difficile colitis 2022- 2023-x2 treated with vancomycin and then Dificid July 2023  History of colonic polyps, off recall given age 81 - 2 serrated adenomas ,largest 1 cm 2008 no polyps 11/28/2011 7 mm polyp - tubular adenoma  2023 no polyps  Dysphagia-tortuous esophagus EGD 01/02/2021 suspect some dysmotility, treated with 53 French Maloney dilator.  Small hiatal hernia also.  On PPI-GERD issues  Chronic recurrent right lower quadrant pain, diverticulitis versus symptomatic  diverticulosis versus IBS-often hard to distinguish clinically, she has had CT documented diverticulitis in the past     HPI 81 year old white woman with GI problems as above, presents for follow-up after treatment of C. difficile colitis this summer using Dificid.  It took some time to get that treatment but it was approved and she did not have to pay anything for it.  Her C. difficile colitis diarrhea has resolved.  She still has some intermittent abdominal pain on the right side at times and will have some urge defecation incontinence.  She says about once a week something protrudes from her rectum and she has to push it back in.  She wants me to evaluate her for this.  This has developed recently.  There is no rectal bleeding.  There is no known history of rectal prolapse and she does not recall having hemorrhoid symptoms at least not with bleeding but she does have a known history of rectocele.  Allergies  Allergen Reactions   Codeine Nausea Only   Current Meds  Medication Sig   acetaminophen (TYLENOL) 500 MG tablet Take 1,000 mg by mouth at bedtime as needed for moderate pain (Sleep).   ALPRAZolam (XANAX) 0.25 MG tablet TAKE 1 TABLET (0.25 MG TOTAL) BY MOUTH DAILY AS NEEDED. FOR ANXIETY   Calcium Carbonate-Vitamin D (CALCIUM + D PO) Take 600 mg by mouth daily.   diltiazem (CARDIZEM CD) 240 MG 24 hr capsule TAKE 1 CAPSULE BY MOUTH EVERY DAY   dorzolamide-timolol (COSOPT) 22.3-6.8 MG/ML ophthalmic solution Place 1 drop into both eyes 2 (two) times daily.   escitalopram (LEXAPRO) 20 MG tablet TAKE 1 TABLET BY MOUTH EVERY  DAY (Patient taking differently: Take 20 mg by mouth daily.)   hydrochlorothiazide (HYDRODIURIL) 25 MG tablet TAKE 1 TABLET BY MOUTH EVERY DAY   hydrochlorothiazide (MICROZIDE) 12.5 MG capsule Take 12.5 mg by mouth daily.   melatonin 5 MG TABS Take 5 mg by mouth at bedtime as needed (Sleep).   pantoprazole (PROTONIX) 20 MG tablet TAKE 1 TABLET BY MOUTH EVERY DAY    rosuvastatin (CRESTOR) 10 MG tablet Take 1 tablet (10 mg total) by mouth once a week.   valsartan (DIOVAN) 320 MG tablet TAKE 1 TABLET BY MOUTH EVERY DAY   vitamin B-12 (CYANOCOBALAMIN) 1000 MCG tablet Take 1,000 mcg by mouth daily.   Past Medical History:  Diagnosis Date   Adenomatous colon polyp 08/11/2004   Anxiety    Arthritis    Benign esophageal stricture 10/14/2012   C. difficile diarrhea    CERUMEN IMPACTION, BILATERAL 12/13/2007   CERVICAL RADICULOPATHY 11/05/2009   Chronic kidney disease    kidney stones   DEPRESSION 03/11/2007   DIVERTICULOSIS, COLON 03/11/2007   Esophagitis, reflux 10/14/2012   GERD (gastroesophageal reflux disease) 10/14/2012   Glaucoma    GLAUCOMA ASSOCIATED W/UNSPEC OCULAR DISORDER 11/05/2009   Hearing loss    HEMORRHOIDS, EXTERNAL W/O COMPLICATION 89/16/9450   Qualifier: Diagnosis of  By: Courtney Norman, LPN, [name]    HYPERLIPIDEMIA 09/22/2007   HYPERTENSION 03/11/2007   Irritable bowel syndrome    Kidney stone    Osteopenia 02/09/2015   T score -1.4 FRAX 7.3%/0.6%   Rectocele    Rosacea 03/11/2007   Tachycardia    Past Surgical History:  Procedure Laterality Date   ANTERIOR CERVICAL DECOMP/DISCECTOMY FUSION  08/14/2011   Procedure: ANTERIOR CERVICAL DECOMPRESSION/DISCECTOMY FUSION 1 LEVEL/HARDWARE REMOVAL;  Surgeon: Courtney Shoals, MD;  Location: [facility];  Service: Neurosurgery;  Laterality: N/A;  Cervical Six-Seven anterior cervical decompression with fusion interbody prothesis plating and bonegraft with removal of hardware of Cervical Four to Cervical Six    BIOPSY  09/26/2021   Procedure: BIOPSY;  Surgeon: Courtney Banister, MD;  Location: [facility];  Service: Endoscopy;;   CERVICAL FUSION     C4-6   COLONOSCOPY     multiple   COLONOSCOPY N/A 09/26/2021   Procedure: COLONOSCOPY;  Surgeon: Courtney Banister, MD;  Location: [facility];  Service: Endoscopy;  Laterality: N/A;   ESOPHAGOGASTRODUODENOSCOPY  10/14/2012   Dr. Silvano Montoya    KIDNEY SURGERY     age 29   TONSILLECTOMY     Social History   Social History Narrative   Lives alone. Widowed. 2 living children (lost son to MI 2019 and daughter to pancreatic cancer 2020, another son MI 2021) and 7 grandchildren. 3 greatgrandkids      Retired from [employer] after 5 years and later took care of children.       Hobbies: time with grandkids, shopping, go out to eat. Travels once a year with girlfriends.    family history includes Cerebral palsy in her sister; Diabetes in her maternal grandmother; Glaucoma in her mother; Heart attack in her son and son; Heart disease in her father; Hypertension in her father; Lung cancer in her father; Mental illness in her mother; Pancreatic cancer in her daughter; Stroke in her paternal aunt, paternal aunt, paternal uncle, paternal uncle, paternal uncle, and sister.   Review of Systems  As per HPI Objective:   Physical Exam BP 124/66   Pulse (!) 56   Ht 5' 1.5" (1.562 Montoya)   Wt 116 lb (  52.6 kg)   LMP 08/12/1991   SpO2 97%   BMI 21.56 kg/Montoya  Elderly [employer] NAD  FEMALE CMA present for exam   Anoderm inspection revealed external hemorrhoids, trace prolapse w/ valsalva Digital exam revealed sl decreased normal resting tone and voluntary squeeze was abnormal - descent of rectum noted when asked to tighten sphincter Large rectocele present. No mass, soft brown stool  Inspection of female genitalia shows submucosal bulge in the posterior wall of the vagina  Anoscopy  Gr 2 internal hemorrhoids all positions,

## 2022-05-05 ENCOUNTER — Encounter: Payer: Self-pay | Admitting: *Deleted

## 2022-05-09 ENCOUNTER — Telehealth: Payer: Self-pay | Admitting: Internal Medicine

## 2022-05-09 NOTE — Telephone Encounter (Signed)
Patient called stated she has lost her bottom. She said she is miserable and requested to speak with a nurse right away.

## 2022-05-09 NOTE — Telephone Encounter (Signed)
Pt stated that she is in severe pain per rectum and and feels like her bowels are falling out and she is having to push them back in: Pt states that she is having severe burning as well: Pt stated that her last BM was two days ago and now all she has is smears:  Pt was recommended to go to the ED for evaluation and treatment:  Pt stated that she did not want to go to the ED and sit all day and she might try her PCP: Pt was notified to call her PCP office right away and see if they want to see her for this and schedule an appointment for today or if they want her to proceed to the ED:  Pt was notified to call our office with an update after being evaluated: Pt verbalized understanding with all questions answered.

## 2022-05-12 NOTE — Telephone Encounter (Signed)
Inbound call from patient stating that the diarrhea has subsided but is having terrible pain in her stomach and her rectum and feels weak like she has no energy. Patient is requesting a call back to discuss what she needs to do moving forward. Please advise.

## 2022-05-12 NOTE — Telephone Encounter (Signed)
Dr. Carlean Purl Pt Please see previous notes from Friday: Pt stated that she took a laxative on Friday night and had a large liquid BM and multiple loose BM throughout the night, along with loose stools Saturday and Sunday: Pt stating that she is still having abdominal pain and rectal pain and weak as water:  Pt states that she is still having to "push her bowels back inside after BMs"  Pt had recent OV with Dr. Carlean Purl on 04/22/2022: stated as follows:     "She says about once a week something protrudes from her rectum and she has to push it back in.  She wants me to evaluate her for this.  This has developed recently.  There is no rectal bleeding.  There is no known history of rectal prolapse and she does not recall having hemorrhoid symptoms at least not with bleeding but she does have a known history of rectocele."   Pt was advised that we have no soon appointments  for Office Visits and that she may have to go to the ED for evaluation and treatment Please advise as DOD if any other recommendations

## 2022-05-12 NOTE — Telephone Encounter (Signed)
Pt was made aware of Dr. Tarri Glenn recommendations: Pt verbalized understanding with all questions answered.

## 2022-05-14 NOTE — Telephone Encounter (Signed)
Dr. Carlean Purl Pt Patient called states she called [facility] on [address] to schedule an appointment but she was told she needs a referral in order of her to schedule. Patient called back wondering if a referral can be call sent for her:   Please see notes below:   Would you like referral to come from our office or notify pt to contact her PCP for referral: Last seen Dr. Phineas Real in 2017 with Gynecology Please advise:

## 2022-05-14 NOTE — Telephone Encounter (Signed)
Patient called states she called [facility] on [address] [place] to schedule an appointment but she was told she needs a referral in order of her to schedule. Patient called back wondering if a referral can be call in for her. Requesting a call back.

## 2022-05-14 NOTE — Telephone Encounter (Signed)
[name] pt please route accordingly.

## 2022-05-15 ENCOUNTER — Other Ambulatory Visit: Payer: Self-pay

## 2022-05-15 DIAGNOSIS — K648 Other hemorrhoids: Secondary | ICD-10-CM

## 2022-05-15 DIAGNOSIS — N816 Rectocele: Secondary | ICD-10-CM

## 2022-05-15 NOTE — Telephone Encounter (Signed)
Pt was made aware of Dr. Tarri Glenn recommendation: Ambulatory referral sent to OBGYN, Pt made aware Pt verbalized understanding with all questions answered.

## 2022-05-23 ENCOUNTER — Encounter: Payer: Self-pay | Admitting: Obstetrics & Gynecology

## 2022-05-23 ENCOUNTER — Ambulatory Visit (INDEPENDENT_AMBULATORY_CARE_PROVIDER_SITE_OTHER): Payer: Medicare Other | Admitting: Obstetrics & Gynecology

## 2022-05-23 VITALS — BP 110/78 | HR 80 | Ht 59.75 in | Wt 118.0 lb

## 2022-05-23 DIAGNOSIS — N816 Rectocele: Secondary | ICD-10-CM

## 2022-05-23 NOTE — Progress Notes (Signed)
    KYIRA VOLKERT 1940/08/27 [id number]        81 y.o.  [id number]   RP: Bulging at the vulva with difficulty passing stools  HPI: Bulging at the vulva with discomfort.  Sometimes having a difficult time passing stools and having to push the bulge back in.  Postmenopause with no PMB.  No pelvic pain.  Urine normal.   OB History  Gravida Para Term Preterm AB Living  '5 5 5     5  '$ SAB IAB Ectopic Multiple Live Births               # Outcome Date GA Lbr Len/2nd Weight Sex Delivery Anes PTL Lv  5 Term           4 Term           3 Term           2 Term           1 Term             Past medical history,surgical history, problem list, medications, allergies, family history and social history were all reviewed and documented in the EPIC chart.   Directed ROS with pertinent positives and negatives documented in the history of present illness/assessment and plan.  Exam:  Vitals:   05/23/22 1356  BP: 110/78  Pulse: 80  SpO2: 95%  Weight: 118 lb (53.5 kg)  Height: 4' 11.75" (1.518 m)   General appearance:  Normal  Gynecologic exam: Bimanual exam:  Uterus AV, normal volume, mobile, NT.  No adnexal mass, NT.   Laying down and standing with Valsalva:  Rectocele grade 3/3.  Pessary fitting.  Comfortable with Milex Ring #1 with support.  Patient's pessary put in place.    Rectal exam normal   Assessment/Plan:  81 y.o. G5P5L5   1. Baden-Walker grade 3 rectocele  Bulging at the vulva with discomfort.  Sometimes having a difficult time passing stools and having to push the bulge back in.  Postmenopause with no PMB.  No pelvic pain.  Urine normal.  On exam: Laying down and standing with Valsalva:  Rectocele grade 3/3.  Counseling on and management of symptomatic Rectocele grade 3/3 done.  Pessary management vs surgical correction discussed.  Decision to proceed with Pessary fitting today.  Comfortable with Milex Ring #1 with support.  Patient's pessary put in place. Precautions reviewed.   F/U in 2 weeks to reassessment.   Princess Bruins MD, 2:28 PM 05/23/2022

## 2022-05-27 ENCOUNTER — Telehealth: Payer: Self-pay

## 2022-05-27 ENCOUNTER — Ambulatory Visit (INDEPENDENT_AMBULATORY_CARE_PROVIDER_SITE_OTHER): Payer: Medicare Other | Admitting: Obstetrics & Gynecology

## 2022-05-27 ENCOUNTER — Encounter: Payer: Self-pay | Admitting: Obstetrics & Gynecology

## 2022-05-27 VITALS — BP 116/74 | HR 64

## 2022-05-27 DIAGNOSIS — N816 Rectocele: Secondary | ICD-10-CM | POA: Diagnosis not present

## 2022-05-27 DIAGNOSIS — Z4689 Encounter for fitting and adjustment of other specified devices: Secondary | ICD-10-CM

## 2022-05-27 NOTE — Progress Notes (Signed)
    MAURY BAMBA 1941/06/15 [id number]        81 y.o.  [id number]   RP: Pessary refitting as the previous one fell out  HPI: Bulging at the vulva with discomfort.  Sometimes having a difficult time passing stools and having to push the bulge back in. Patient had a pessary fitting for a symptomatic Rectocele grade 3/3 on 05/23/22.  A Milex ring #1 with support was fitted.  Patient was not tolerating insertion of a #2 pessary very well.  The pessary fell out as patient stood up at home.     OB History  Gravida Para Term Preterm AB Living  '5 5 5     5  '$ SAB IAB Ectopic Multiple Live Births               # Outcome Date GA Lbr Len/2nd Weight Sex Delivery Anes PTL Lv  5 Term           4 Term           3 Term           2 Term           1 Term             Past medical history,surgical history, problem list, medications, allergies, family history and social history were all reviewed and documented in the EPIC chart.   Directed ROS with pertinent positives and negatives documented in the history of present illness/assessment and plan.  Exam:  Vitals:   05/27/22 1203  BP: 116/74  Pulse: 64  SpO2: 99%   General appearance:  Normal  Gynecologic exam: Vulva normal.  Pessary fitting with Milex ring #2 with support.  Patient experiencing pain at insertion. Patient went to the bathroom and the pessary fell out.     Assessment/Plan:  81 y.o. [id number]   1. Encounter for fitting and adjustment of pessary Bulging at the vulva with discomfort.  Sometimes having a difficult time passing stools and having to push the bulge back in. Patient had a pessary fitting for a symptomatic Rectocele grade 3/3 on 05/23/22.  A Milex ring #1 with support was fitted.  Patient was not tolerating insertion of a #2 pessary very well.  The pessary fell out as patient stood up at home.  Pessary fitting with Milex ring #2 with support.  Patient experiencing pain at insertion. Patient went to the bathroom and the  pessary fell out. Given that patient is 81 yo with significant cardiovascular risks, decision to refer to Dr Simon Rhein for evaluation and management, before resorting to surgical correction.  2. Baden-Walker grade 3 rectocele  Sxic grade 3/3 rectocele with bulging at the vulva with discomfort.  Sometimes having a difficult time passing stools and having to push the bulge back in.  Princess Bruins MD, 12:07 PM 05/27/2022

## 2022-05-27 NOTE — Telephone Encounter (Signed)
Referral placed.

## 2022-05-27 NOTE — Telephone Encounter (Signed)
-----   Message from Princess Bruins, MD sent at 05/27/2022 12:34 PM EDT ----- Regarding: Refer to Uro-Gyn Dr Simon Rhein Sxic grade 3/3 Rectocele with pessary failure.  Refer for evaluation and management.

## 2022-06-03 ENCOUNTER — Ambulatory Visit (INDEPENDENT_AMBULATORY_CARE_PROVIDER_SITE_OTHER): Payer: Medicare Other

## 2022-06-03 DIAGNOSIS — Z0000 Encounter for general adult medical examination without abnormal findings: Secondary | ICD-10-CM | POA: Diagnosis not present

## 2022-06-03 NOTE — Progress Notes (Signed)
I connected with  [name] on 06/03/22 by a audio enabled telemedicine application and verified that I am speaking with the correct person using two identifiers.  Patient Location: Home  Provider Location: Home Office  I discussed the limitations of evaluation and management by telemedicine. The patient expressed understanding and agreed to proceed.   Subjective:   [name] is a 81 y.o. female who presents for Medicare Annual (Subsequent) preventive examination.  Review of Systems     Cardiac Risk Factors include: advanced age (>78mn, >>70women);diabetes mellitus;hypertension;dyslipidemia     Objective:    There were no vitals filed for this visit. There is no height or weight on file to calculate BMI.     06/03/2022    8:07 AM 09/26/2021    9:12 AM 07/21/2021    3:47 AM 07/20/2021   11:26 AM 12/03/2020    9:34 AM 03/14/2020   11:26 AM 07/21/2019   10:45 AM  Advanced Directives  Does Patient Have a Medical Advance Directive? Yes Yes  No Yes Yes Yes  Type of AParamed[place]of [place] will [place] Creek[place] will   [place]edar CrestLiving will  Living will;Healthcare Power of Attorney  Does patient want to make changes to medical advance directive?     No - Patient declined  No - Patient declined  Copy of [place] Chart? No - copy requested No - copy requested     No - copy requested  Would patient like information on creating a medical advance directive?   No - Patient declined        Current Medications (verified) Outpatient Encounter Medications as of 06/03/2022  Medication Sig   acetaminophen (TYLENOL) 500 MG tablet Take 1,000 mg by mouth at bedtime as needed for moderate pain (Sleep).   albuterol (VENTOLIN HFA) 108 (90 Base) MCG/ACT inhaler Inhale 2 puffs into the lungs every 6 (six) hours as needed for wheezing or shortness of breath (or cough).   ALPRAZolam (XANAX) 0.25 MG tablet  TAKE 1 TABLET (0.25 MG TOTAL) BY MOUTH DAILY AS NEEDED. FOR ANXIETY   Calcium Carbonate-Vitamin D (CALCIUM + D PO) Take 600 mg by mouth daily.   diltiazem (CARDIZEM CD) 240 MG 24 hr capsule TAKE 1 CAPSULE BY MOUTH EVERY DAY   dorzolamide-timolol (COSOPT) 22.3-6.8 MG/ML ophthalmic solution Place 1 drop into both eyes 2 (two) times daily.   escitalopram (LEXAPRO) 20 MG tablet TAKE 1 TABLET BY MOUTH EVERY DAY (Patient taking differently: Take 20 mg by mouth daily.)   hydrochlorothiazide (HYDRODIURIL) 25 MG tablet TAKE 1 TABLET BY MOUTH EVERY DAY   melatonin 5 MG TABS Take 5 mg by mouth at bedtime as needed (Sleep).   rosuvastatin (CRESTOR) 10 MG tablet Take 1 tablet (10 mg total) by mouth once a week.   valsartan (DIOVAN) 320 MG tablet TAKE 1 TABLET BY MOUTH EVERY DAY   No facility-administered encounter medications on file as of 06/03/2022.    Allergies (verified) Codeine   History: Past Medical History:  Diagnosis Date   Adenomatous colon polyp 08/11/2004   Anxiety    Arthritis    Benign esophageal stricture 10/14/2012   C. difficile diarrhea    CERUMEN IMPACTION, BILATERAL 12/13/2007   CERVICAL RADICULOPATHY 11/05/2009   Chronic kidney disease    kidney stones   DEPRESSION 03/11/2007   DIVERTICULOSIS, COLON 03/11/2007   Esophagitis, reflux 10/14/2012   GERD (gastroesophageal reflux disease) 10/14/2012   Glaucoma  GLAUCOMA ASSOCIATED W/UNSPEC OCULAR DISORDER 11/05/2009   Hearing loss    HEMORRHOIDS, EXTERNAL W/O COMPLICATION 66/44/0347   Qualifier: Diagnosis of  By: Jimmye Norman, LPN, Bonnye M    HYPERLIPIDEMIA 09/22/2007   HYPERTENSION 03/11/2007   Irritable bowel syndrome    Kidney stone    Osteopenia 02/09/2015   T score -1.4 FRAX 7.3%/0.6%   Rectocele    Rosacea 03/11/2007   Tachycardia    Past Surgical History:  Procedure Laterality Date   ANTERIOR CERVICAL DECOMP/DISCECTOMY FUSION  08/14/2011   Procedure: ANTERIOR CERVICAL DECOMPRESSION/DISCECTOMY FUSION 1  LEVEL/HARDWARE REMOVAL;  Surgeon: [name], MD;  Location: [facility];  Service: Neurosurgery;  Laterality: N/A;  Cervical Six-Seven anterior cervical decompression with fusion interbody prothesis plating and bonegraft with removal of hardware of Cervical Four to Cervical Six    BIOPSY  09/26/2021   Procedure: BIOPSY;  Surgeon: Milus Banister, MD;  Location: [facility];  Service: Endoscopy;;   CERVICAL FUSION     C4-6   COLONOSCOPY     multiple   COLONOSCOPY N/A 09/26/2021   Procedure: COLONOSCOPY;  Surgeon: Milus Banister, MD;  Location: [facility];  Service: Endoscopy;  Laterality: N/A;   ESOPHAGOGASTRODUODENOSCOPY  10/14/2012   Dr. [name]   KIDNEY SURGERY     age 56   TONSILLECTOMY     Family History  Problem Relation Age of Onset   Mental illness Mother        comitted suicide   Glaucoma Mother    Lung cancer Father        smoker   Hypertension Father    Heart disease Father        age 30, smoker   Cerebral palsy Sister    Stroke Sister        multiple   Diabetes Maternal Grandmother    Stroke Paternal Aunt    Stroke Paternal Uncle    Stroke Paternal Uncle    Stroke Paternal Uncle    Stroke Paternal Aunt    Pancreatic cancer Daughter    Heart attack Son        102   Heart attack Son        23   Colon cancer Neg Hx    Stomach cancer Neg Hx    Esophageal cancer Neg Hx    Rectal cancer Neg Hx    Social History   Socioeconomic History   Marital status: Widowed    Spouse name: Not on file   Number of children: Not on file   Years of education: Not on file   Highest education level: Not on file  Occupational History   Occupation: retired    Fish farm manager: RETIRED  Tobacco Use   Smoking status: Never   Smokeless tobacco: Never  Vaping Use   Vaping Use: Never used  Substance and Sexual Activity   Alcohol use: No    Alcohol/week: 0.0 standard drinks of alcohol   Drug use: No   Sexual activity: Not Currently    Birth control/protection:  Post-menopausal    Comment: 1st intercourse 81 yo-Fewer than 5 partners  Other Topics Concern   Not on file  Social History Narrative   Lives alone. Widowed. 2 living children (lost son to MI 2019 and daughter to pancreatic cancer 2020, another son MI 2021) and 7 grandchildren. 3 greatgrandkids      Retired from [employer] after 5 years and later took care of children.       Hobbies: time with  grandkids, shopping, go out to eat. Travels once a year with girlfriends.    Social Determinants of Health   Financial Resource Strain: Low Risk  (06/03/2022)   Overall Financial Resource Strain (CARDIA)    Difficulty of Paying Living Expenses: Not hard at all  Food Insecurity: No Food Insecurity (06/03/2022)   Hunger Vital Sign    Worried About Running Out of Food in the Last Year: Never true    Ran Out of Food in the Last Year: Never true  Transportation Needs: No Transportation Needs (06/03/2022)   PRAPARE - Hydrologist (Medical): No    Lack of Transportation (Non-Medical): No  Physical Activity: Inactive (06/03/2022)   Exercise Vital Sign    Days of Exercise per Week: 0 days    Minutes of Exercise per Session: 0 min  Stress: No Stress Concern Present (06/03/2022)   [place]    Feeling of Stress : Only a little  Social Connections: Moderately Integrated (06/03/2022)   Social Connection and Isolation Panel [NHANES]    Frequency of Communication with Friends and Family: More than three times a week    Frequency of Social Gatherings with Friends and Family: More than three times a week    Attends Religious Services: More than 4 times per year    Active Member of [employer] or Organizations: Yes    Attends Archivist Meetings: 1 to 4 times per year    Marital Status: Widowed    Tobacco Counseling Counseling given: Not Answered   Clinical Intake:  Pre-visit preparation completed:  Yes  Pain : No/denies pain     Nutritional Risks: None Diabetes: Yes CBG done?: No Did pt. bring in CBG monitor from home?: No  How often do you need to have someone help you when you read instructions, pamphlets, or other written materials from your doctor or pharmacy?: 1 - Never  Diabetic?Nutrition Risk Assessment:  Has the patient had any N/V/D within the last 2 months?  No  Does the patient have any non-healing wounds?  No  Has the patient had any unintentional weight loss or weight gain?  No   Diabetes:  Is the patient diabetic?  Yes  If diabetic, was a CBG obtained today?  No  Did the patient bring in their glucometer from home?  No  How often do you monitor your CBG's? N/A.   Financial Strains and Diabetes Management:  Are you having any financial strains with the device, your supplies or your medication? No .  Does the patient want to be seen by Chronic Care Management for management of their diabetes?  No  Would the patient like to be referred to a Nutritionist or for Diabetic Management?  No   Diabetic Exams:  Diabetic Eye Exam: Overdue for diabetic eye exam. Pt has been advised about the importance in completing this exam. Patient advised to call and schedule an eye exam. Diabetic Foot Exam: Overdue, Pt has been advised about the importance in completing this exam. Pt is scheduled for diabetic foot exam on next appt .   Interpreter Needed?: No  Information entered by :: Charlott Rakes, LPN   Activities of Daily Living    06/03/2022    8:09 AM 07/21/2021    3:10 AM  In your present state of health, do you have any difficulty performing the following activities:  Hearing? 0 1  Vision? 0 1  Difficulty concentrating or making  decisions? 0 0  Walking or climbing stairs? 0 0  Comment very careful   Dressing or bathing? 0 1  Doing errands, shopping? 0 0  Preparing Food and eating ? N   Using the Toilet? N   In the past six months, have you accidently  leaked urine? N   Do you have problems with loss of bowel control? Y   Managing your Medications? N   Managing your Finances? N   Housekeeping or managing your Housekeeping? N     Patient Care Team: [name], MD as PCP - General (Family Medicine) Drake Leach, Walker as Consulting Physician (Optometry) Carlean Purl Ofilia Neas, MD as Consulting Physician (Gastroenterology) Princess Bruins, MD as Consulting Physician (Obstetrics and Gynecology)  Indicate any recent Medical Services you may have received from other than [facility] providers in the past year (date may be approximate).     Assessment:   This is a routine wellness examination for [place].  Hearing/Vision screen Hearing Screening - Comments:: Pt  wears a hearing aid  Vision Screening - Comments:: Pt follows up with provider in [place] for annul eye exams   Dietary issues and exercise activities discussed: Current Exercise Habits: The patient does not participate in regular exercise at present   Goals Addressed             This Visit's Progress    Patient Stated       Get back to walking        Depression Screen    06/03/2022    8:05 AM 04/03/2022   11:45 AM 07/16/2021    1:21 PM 02/06/2021    9:41 AM 07/25/2020   11:03 AM 01/24/2020   10:37 AM 07/21/2019   11:00 AM  PHQ 2/9 Scores  PHQ - 2 Score 1 0 0 1 1 0 3  PHQ- 9 Score   '2 2 3 1 4    '$ Fall Risk    06/03/2022    8:09 AM 02/06/2021    9:41 AM 07/25/2020   11:03 AM 07/21/2019   10:43 AM 03/23/2019   10:02 AM  Fall Risk   Falls in the past year? 0 0 0 0 0  Number falls in past yr: 0 0 0  0  Injury with Fall? 0 0 0 0 0  Risk for fall due to : No Fall Risks;Impaired vision;Impaired balance/gait No Fall Risks     Follow up Falls prevention discussed Falls evaluation completed  Falls evaluation completed;Education provided;Falls prevention discussed     FALL RISK PREVENTION PERTAINING TO THE HOME:  Any stairs in or around the home? Yes  If so, are  there any without handrails? No  Home free of loose throw rugs in walkways, pet beds, electrical cords, etc? Yes  Adequate lighting in your home to reduce risk of falls? Yes   ASSISTIVE DEVICES UTILIZED TO PREVENT FALLS:  Life alert? No  Use of a cane, walker or w/c? No  Grab bars in the bathroom? No  Shower chair or bench in shower? No  Elevated toilet seat or a handicapped toilet? No   TIMED UP AND GO:  Was the test performed? No .   Cognitive Function:        06/03/2022    8:10 AM 07/21/2019   10:44 AM  6CIT Screen  What Year? 0 points 0 points  What month? 0 points 0 points  What time? 0 points 0 points  Count back from 20 0 points 0 points  Months in reverse 2 points 0 points  Repeat phrase 4 points 2 points  Total Score 6 points 2 points    Immunizations Immunization History  Administered Date(s) Administered   Fluad Quad(high Dose 65+) 05/24/2021   Influenza Split 06/25/2011, 06/11/2012   Influenza Whole 06/29/2009, 06/27/2010   Influenza, High Dose Seasonal PF 06/10/2013, 06/02/2014, 06/09/2019, 05/25/2020   Influenza,inj,Quad PF,6+ Mos 06/09/2019   Influenza-Unspecified 05/18/2015, 05/15/2016, 05/28/2017, 05/26/2018   PFIZER Comirnaty(Gray Top)Covid-19 Tri-Sucrose Vaccine 11/22/2020   PFIZER(Purple Top)SARS-COV-2 Vaccination 09/15/2019, 10/10/2019, 05/11/2020   Pfizer Covid-19 Vaccine Bivalent Booster 24yr & up 05/10/2021   Pneumococcal Conjugate-13 07/14/2014   Pneumococcal Polysaccharide-23 09/11/2005   Td 11/10/1998, 10/20/2008   Tdap 02/06/2015   Zoster Recombinat (Shingrix) 02/07/2021, 04/25/2021   Zoster, Live 03/22/2008    TDAP status: Up to date  Flu Vaccine status: Due, Education has been provided regarding the importance of this vaccine. Advised may receive this vaccine at local pharmacy or Health Dept. Aware to provide a copy of the vaccination record if obtained from local pharmacy or Health Dept. Verbalized acceptance and  understanding.  Pneumococcal vaccine status: Up to date  Covid-19 vaccine status: Completed vaccines  Qualifies for Shingles Vaccine? Yes   Zostavax completed Yes   Shingrix Completed?: Yes  Screening Tests Health Maintenance  Topic Date Due   Medicare Annual Wellness (AWV)  Never done   OPHTHALMOLOGY EXAM  Never done   Diabetic kidney evaluation - Urine ACR  Never done   COVID-19 Vaccine (6 - Pfizer risk series) 07/05/2021   FOOT EXAM  07/25/2021   HEMOGLOBIN A1C  01/19/2022   INFLUENZA VACCINE  03/11/2022   Diabetic kidney evaluation - GFR measurement  02/14/2023   TETANUS/TDAP  02/05/2025   Pneumonia Vaccine 81 Years old  Completed   DEXA SCAN  Completed   Zoster Vaccines- Shingrix  Completed   HPV VACCINES  Aged Out    Health Maintenance  Health Maintenance Due  Topic Date Due   Medicare Annual Wellness (AWV)  Never done   OPHTHALMOLOGY EXAM  Never done   Diabetic kidney evaluation - Urine ACR  Never done   COVID-19 Vaccine (6 - Pfizer risk series) 07/05/2021   FOOT EXAM  07/25/2021   HEMOGLOBIN A1C  01/19/2022   INFLUENZA VACCINE  03/11/2022    Colorectal cancer screening: Type of screening: Colonoscopy. Completed 09/26/21. Repeat every as directed  years  Mammogram status: Completed 09/06/21. Repeat every year  Bone Density status: Completed 02/13/15. Results reflect: Bone density results: OSTEOPENIA. Repeat every 2 years.   Additional Screening:   Vision Screening: Recommended annual ophthalmology exams for early detection of glaucoma and other disorders of the eye. Is the patient up to date with their annual eye exam?  no Who is the provider or what is the name of the office in which the patient attends annual eye exams? Unsure of providers name  If pt is not established with a provider, would they like to be referred to a provider to establish care? No .   Dental Screening: Recommended annual dental exams for proper oral hygiene  Community Resource  Referral / Chronic Care Management: CRR required this visit?  No   CCM required this visit?  No      Plan:     I have personally reviewed and noted the following in the patient's chart:   Medical and social history Use of alcohol, tobacco or illicit drugs  Current medications and supplements including opioid prescriptions. Patient is not currently  taking opioid prescriptions. Functional ability and status Nutritional status Physical activity Advanced directives List of other physicians Hospitalizations, surgeries, and ER visits in previous 12 months Vitals Screenings to include cognitive, depression, and falls Referrals and appointments  In addition, I have reviewed and discussed with patient certain preventive protocols, quality metrics, and best practice recommendations. A written personalized care plan for preventive services as well as general preventive health recommendations were provided to patient.     Willette Brace, LPN   [id number]   Nurse Notes: none

## 2022-06-03 NOTE — Patient Instructions (Signed)
Ms. Oblinger , Thank you for taking time to come for your Medicare Wellness Visit. I appreciate your ongoing commitment to your health goals. Please review the following plan we discussed and let me know if I can assist you in the future.   These are the goals we discussed:  Goals       Exercise 150 minutes per week (moderate activity)      Generally will try to walk briskly 5 days a week       No needs (pt-stated)      Care Coordination Interventions: Reviewed medications with patient and discussed purpose of all medications Reviewed scheduled/upcoming provider appointments including pending appointments and requested pt contact her provider's office to schedule her AWV (receptive). Screening for signs and symptoms of depression related to chronic disease state  Assessed social determinant of health barriers        Patient Stated      Get back to walking         This is a list of the screening recommended for you and due dates:  Health Maintenance  Topic Date Due   Medicare Annual Wellness Visit  Never done   Eye exam for diabetics  Never done   Yearly kidney health urinalysis for diabetes  Never done   COVID-19 Vaccine (6 - Pfizer risk series) 07/05/2021   Complete foot exam   07/25/2021   Hemoglobin A1C  01/19/2022   Flu Shot  03/11/2022   Yearly kidney function blood test for diabetes  02/14/2023   Tetanus Vaccine  02/05/2025   Pneumonia Vaccine  Completed   DEXA scan (bone density measurement)  Completed   Zoster (Shingles) Vaccine  Completed   HPV Vaccine  Aged Out    Advanced directives: Please bring a copy of your health care power of attorney and living will to the office at your convenience.   Conditions/risks identified: get back to walking   Next appointment: Follow up in one year for your annual wellness visit     Preventive Care 65 Years and Older, Female Preventive care refers to lifestyle choices and visits with your health care provider that can  promote health and wellness. What does preventive care include? A yearly physical exam. This is also called an annual well check. Dental exams once or twice a year. Routine eye exams. Ask your health care provider how often you should have your eyes checked. Personal lifestyle choices, including: Daily care of your teeth and gums. Regular physical activity. Eating a healthy diet. Avoiding tobacco and drug use. Limiting alcohol use. Practicing safe sex. Taking low-dose aspirin every day. Taking vitamin and mineral supplements as recommended by your health care provider. What happens during an annual well check? The services and screenings done by your health care provider during your annual well check will depend on your age, overall health, lifestyle risk factors, and family history of disease. Counseling  Your health care provider may ask you questions about your: Alcohol use. Tobacco use. Drug use. Emotional well-being. Home and relationship well-being. Sexual activity. Eating habits. History of falls. Memory and ability to understand (cognition). Work and work Statistician. Reproductive health. Screening  You may have the following tests or measurements: Height, weight, and BMI. Blood pressure. Lipid and cholesterol levels. These may be checked every 5 years, or more frequently if you are over 85 years old. Skin check. Lung cancer screening. You may have this screening every year starting at age 69 if you have a 30-pack-year history  of smoking and currently smoke or have quit within the past 15 years. Fecal occult blood test (FOBT) of the stool. You may have this test every year starting at age 34. Flexible sigmoidoscopy or colonoscopy. You may have a sigmoidoscopy every 5 years or a colonoscopy every 10 years starting at age 56. Hepatitis C blood test. Hepatitis B blood test. Sexually transmitted disease (STD) testing. Diabetes screening. This is done by checking your  blood sugar (glucose) after you have not eaten for a while (fasting). You may have this done every 1-3 years. Bone density scan. This is done to screen for osteoporosis. You may have this done starting at age 27. Mammogram. This may be done every 1-2 years. Talk to your health care provider about how often you should have regular mammograms. Talk with your health care provider about your test results, treatment options, and if necessary, the need for more tests. Vaccines  Your health care provider may recommend certain vaccines, such as: Influenza vaccine. This is recommended every year. Tetanus, diphtheria, and acellular pertussis (Tdap, Td) vaccine. You may need a Td booster every 10 years. Zoster vaccine. You may need this after age 71. Pneumococcal 13-valent conjugate (PCV13) vaccine. One dose is recommended after age 89. Pneumococcal polysaccharide (PPSV23) vaccine. One dose is recommended after age 33. Talk to your health care provider about which screenings and vaccines you need and how often you need them. This information is not intended to replace advice given to you by your health care provider. Make sure you discuss any questions you have with your health care provider. Document Released: 08/24/2015 Document Revised: 04/16/2016 Document Reviewed: 05/29/2015 Elsevier Interactive Patient Education  2017 [place] Prevention in the Home Falls can cause injuries. They can happen to people of all ages. There are many things you can do to make your home safe and to help prevent falls. What can I do on the outside of my home? Regularly fix the edges of walkways and driveways and fix any cracks. Remove anything that might make you trip as you walk through a door, such as a raised step or threshold. Trim any bushes or trees on the path to your home. Use bright outdoor lighting. Clear any walking paths of anything that might make someone trip, such as rocks or tools. Regularly  check to see if handrails are loose or broken. Make sure that both sides of any steps have handrails. Any raised decks and porches should have guardrails on the edges. Have any leaves, snow, or ice cleared regularly. Use sand or salt on walking paths during winter. Clean up any spills in your garage right away. This includes oil or grease spills. What can I do in the bathroom? Use night lights. Install grab bars by the toilet and in the tub and shower. Do not use towel bars as grab bars. Use non-skid mats or decals in the tub or shower. If you need to sit down in the shower, use a plastic, non-slip stool. Keep the floor dry. Clean up any water that spills on the floor as soon as it happens. Remove soap buildup in the tub or shower regularly. Attach bath mats securely with double-sided non-slip rug tape. Do not have throw rugs and other things on the floor that can make you trip. What can I do in the bedroom? Use night lights. Make sure that you have a light by your bed that is easy to reach. Do not use any sheets or blankets  that are too big for your bed. They should not hang down onto the floor. Have a firm chair that has side arms. You can use this for support while you get dressed. Do not have throw rugs and other things on the floor that can make you trip. What can I do in the kitchen? Clean up any spills right away. Avoid walking on wet floors. Keep items that you use a lot in easy-to-reach places. If you need to reach something above you, use a strong step stool that has a grab bar. Keep electrical cords out of the way. Do not use floor polish or wax that makes floors slippery. If you must use wax, use non-skid floor wax. Do not have throw rugs and other things on the floor that can make you trip. What can I do with my stairs? Do not leave any items on the stairs. Make sure that there are handrails on both sides of the stairs and use them. Fix handrails that are broken or loose.  Make sure that handrails are as long as the stairways. Check any carpeting to make sure that it is firmly attached to the stairs. Fix any carpet that is loose or worn. Avoid having throw rugs at the top or bottom of the stairs. If you do have throw rugs, attach them to the floor with carpet tape. Make sure that you have a light switch at the top of the stairs and the bottom of the stairs. If you do not have them, ask someone to add them for you. What else can I do to help prevent falls? Wear shoes that: Do not have high heels. Have rubber bottoms. Are comfortable and fit you well. Are closed at the toe. Do not wear sandals. If you use a stepladder: Make sure that it is fully opened. Do not climb a closed stepladder. Make sure that both sides of the stepladder are locked into place. Ask someone to hold it for you, if possible. Clearly mark and make sure that you can see: Any grab bars or handrails. First and last steps. Where the edge of each step is. Use tools that help you move around (mobility aids) if they are needed. These include: Canes. Walkers. Scooters. Crutches. Turn on the lights when you go into a dark area. Replace any light bulbs as soon as they burn out. Set up your furniture so you have a clear path. Avoid moving your furniture around. If any of your floors are uneven, fix them. If there are any pets around you, be aware of where they are. Review your medicines with your doctor. Some medicines can make you feel dizzy. This can increase your chance of falling. Ask your doctor what other things that you can do to help prevent falls. This information is not intended to replace advice given to you by your health care provider. Make sure you discuss any questions you have with your health care provider. Document Released: 05/24/2009 Document Revised: 01/03/2016 Document Reviewed: 09/01/2014 Elsevier Interactive Patient Education  2017 [employer].

## 2022-06-06 NOTE — Telephone Encounter (Signed)
Pt currently scheduled for 07/25/22. However, appt notes mention that pt is on wait list for sooner appt if cancellation.

## 2022-06-11 ENCOUNTER — Ambulatory Visit: Payer: Medicare Other | Admitting: Obstetrics & Gynecology

## 2022-06-30 ENCOUNTER — Other Ambulatory Visit: Payer: Self-pay | Admitting: Family Medicine

## 2022-07-09 ENCOUNTER — Other Ambulatory Visit: Payer: Self-pay | Admitting: Family Medicine

## 2022-07-25 ENCOUNTER — Ambulatory Visit: Payer: Medicare Other | Admitting: Obstetrics and Gynecology

## 2022-07-25 ENCOUNTER — Ambulatory Visit (INDEPENDENT_AMBULATORY_CARE_PROVIDER_SITE_OTHER): Payer: Medicare Other | Admitting: Obstetrics and Gynecology

## 2022-07-25 ENCOUNTER — Encounter: Payer: Self-pay | Admitting: Obstetrics and Gynecology

## 2022-07-25 VITALS — BP 147/76 | HR 58 | Ht 58.5 in | Wt 115.0 lb

## 2022-07-25 DIAGNOSIS — N812 Incomplete uterovaginal prolapse: Secondary | ICD-10-CM | POA: Diagnosis not present

## 2022-07-25 DIAGNOSIS — R350 Frequency of micturition: Secondary | ICD-10-CM | POA: Diagnosis not present

## 2022-07-25 DIAGNOSIS — N816 Rectocele: Secondary | ICD-10-CM

## 2022-07-25 DIAGNOSIS — N393 Stress incontinence (female) (male): Secondary | ICD-10-CM | POA: Diagnosis not present

## 2022-07-25 DIAGNOSIS — M62838 Other muscle spasm: Secondary | ICD-10-CM

## 2022-07-25 LAB — POCT URINALYSIS DIPSTICK
Bilirubin, UA: NEGATIVE
Glucose, UA: NEGATIVE
Ketones, UA: NEGATIVE
Leukocytes, UA: NEGATIVE
Nitrite, UA: NEGATIVE
Protein, UA: NEGATIVE
Spec Grav, UA: >=1.03 — AB (ref 1.010–1.025)
Urobilinogen, UA: 0.2 E.U./dL
pH, UA: 6 (ref 5.0–8.0)

## 2022-07-25 NOTE — Patient Instructions (Signed)
You have a stage 3 (out of 4) prolapse.  We discussed the fact that it is not life threatening but there are several treatment options. For treatment of pelvic organ prolapse, we discussed options for management including expectant management, conservative management, and surgical management, such as Kegels, a pessary, pelvic floor physical therapy, and specific surgical procedures.      

## 2022-07-25 NOTE — Progress Notes (Signed)
[name]  Referring Provider: Princess Bruins, MD PCP: [name], MD Date of Service: 07/25/2022  SUBJECTIVE Chief Complaint: New Patient (Initial Visit) [name] is a 81 y.o. female here for a consult for rectocele./)  History of Present Illness: [name] is a 81 y.o. White or Caucasian female seen in [place] at the request of Dr. Dellis Filbert for evaluation of prolapse.    Review of records significant for: #1 ring pessary tried and fell out. Could not tolerate #2 ring and fell out with voiding.   Urinary Symptoms: Leaks urine with cough/ sneeze and with urgency Leaks occasionally- not every day Pad use: 1 liners/ mini-pads per day.   She is bothered by her UI symptoms.  Day time voids 5.  Nocturia: 1-2 times per night to void. Voiding dysfunction: she empties her bladder well.  does not use a catheter to empty bladder.  When urinating, she feels the need to urinate multiple times in a row  UTIs:  0  UTI's in the last year.   Reports history of kidney or bladder stones- 20 years ago  Pelvic Organ Prolapse Symptoms:                  She Admits to a feeling of a bulge the vaginal area. It has been present for 1 day.  She Admits to seeing a bulge.  This bulge is bothersome. She tried two different pessaries and it would not stay in place. She is not interested in another pessary.  Bowel Symptom: Bowel movements: 1-2 time(s) per day Stool consistency: soft  or loose Straining: no.  Splinting: yes.  Incomplete evacuation: yes.  She Denies accidental bowel leakage / fecal incontinence Bowel regimen: sometimes needs immodium if bowels are too loose  Sexual Function Sexually active: no.    Pelvic Pain Admits to pelvic pain Location: bottom of stomach/ at pubic bone Pain occurs: all the time- a dull ache She saw GI and was told not related   Past Medical History:  Past Medical History:   Diagnosis Date   Adenomatous colon polyp 08/11/2004   Anxiety    Arthritis    Benign esophageal stricture 10/14/2012   C. difficile diarrhea    CERUMEN IMPACTION, BILATERAL 12/13/2007   CERVICAL RADICULOPATHY 11/05/2009   Chronic kidney disease    kidney stones   DEPRESSION 03/11/2007   DIVERTICULOSIS, COLON 03/11/2007   Esophagitis, reflux 10/14/2012   GERD (gastroesophageal reflux disease) 10/14/2012   Glaucoma    GLAUCOMA ASSOCIATED W/UNSPEC OCULAR DISORDER 11/05/2009   Hearing loss    HEMORRHOIDS, EXTERNAL W/O COMPLICATION 16/05/9603   Qualifier: Diagnosis of  By: Jimmye Norman, LPN, Bonnye M    HYPERLIPIDEMIA 09/22/2007   HYPERTENSION 03/11/2007   Irritable bowel syndrome    Kidney stone    Osteopenia 02/09/2015   T score -1.4 FRAX 7.3%/0.6%   Rectocele    Rosacea 03/11/2007   Tachycardia      Past Surgical History:   Past Surgical History:  Procedure Laterality Date   ANTERIOR CERVICAL DECOMP/DISCECTOMY FUSION  08/14/2011   Procedure: ANTERIOR CERVICAL DECOMPRESSION/DISCECTOMY FUSION 1 LEVEL/HARDWARE REMOVAL;  Surgeon: [name], MD;  Location: [facility];  Service: Neurosurgery;  Laterality: N/A;  Cervical Six-Seven anterior cervical decompression with fusion interbody prothesis plating and bonegraft with removal of hardware of Cervical Four to Cervical Six    BIOPSY  09/26/2021   Procedure: BIOPSY;  Surgeon: Milus Banister, MD;  Location:  [facility];  Service: Endoscopy;;   CERVICAL FUSION     C4-6   COLONOSCOPY     multiple   COLONOSCOPY N/A 09/26/2021   Procedure: COLONOSCOPY;  Surgeon: Milus Banister, MD;  Location: [facility];  Service: Endoscopy;  Laterality: N/A;   ESOPHAGOGASTRODUODENOSCOPY  10/14/2012   Dr. [name]   KIDNEY SURGERY     age 57- kidney stone removal   TONSILLECTOMY       Past OB/GYN History: OB History  Gravida Para Term Preterm AB Living  '5 5 5     2  '$ SAB IAB Ectopic Multiple Live Births          3    #  Outcome Date GA Lbr Len/2nd Weight Sex Delivery Anes PTL Lv  5 Term      Vag-Spont     4 Term      Vag-Spont     3 Term      Vag-Spont     2 Term      Vag-Spont     1 Term      Vag-Forceps       Menopausal: Denies vaginal bleeding since menopause Any history of abnormal pap smears: no.   Medications: She has a current medication list which includes the following prescription(s): acetaminophen, albuterol, alprazolam, calcium citrate-vitamin d, diltiazem, dorzolamide-timolol, escitalopram, hydrochlorothiazide, melatonin, rosuvastatin, and valsartan.   Allergies: Patient is allergic to codeine.   Social History:  Social History   Tobacco Use   Smoking status: Never   Smokeless tobacco: Never  Vaping Use   Vaping Use: Never used  Substance Use Topics   Alcohol use: No    Alcohol/week: 0.0 standard drinks of alcohol   Drug use: No    Relationship status: widowed She is not employed. Regular exercise: No History of abuse: No  Family History:   Family History  Problem Relation Age of Onset   Mental illness Mother        comitted suicide   Glaucoma Mother    Lung cancer Father        smoker   Hypertension Father    Heart disease Father        age 103, smoker   Cerebral palsy Sister    Stroke Sister        multiple   Pancreatic cancer Daughter    Heart attack Son        84   Heart attack Son        53   Stroke Paternal Aunt    Stroke Paternal Aunt    Stroke Paternal Uncle    Stroke Paternal Uncle    Stroke Paternal Uncle    Diabetes Maternal Grandmother    Colon cancer Neg Hx    Stomach cancer Neg Hx    Esophageal cancer Neg Hx    Rectal cancer Neg Hx      Review of Systems: Review of Systems  Constitutional:  Positive for malaise/fatigue. Negative for fever and weight loss.  Respiratory:  Positive for shortness of breath. Negative for cough and wheezing.   Cardiovascular:  Negative for chest pain, palpitations and leg swelling.  Gastrointestinal:   Positive for abdominal pain. Negative for blood in stool.  Genitourinary:  Negative for dysuria.  Musculoskeletal:  Negative for myalgias.  Skin:  Negative for rash.  Neurological:  Negative for dizziness and headaches.  Endo/Heme/Allergies:  Bruises/bleeds easily.  Psychiatric/Behavioral:  Positive for depression. The patient is nervous/anxious.  OBJECTIVE Physical Exam: Vitals:   07/25/22 0945  BP: (!) 147/76  Pulse: (!) 58  Weight: 115 lb (52.2 kg)  Height: 4' 10.5" (1.486 m)    Physical Exam Constitutional:      General: She is not in acute distress. Pulmonary:     Effort: Pulmonary effort is normal.  Abdominal:     General: There is no distension.     Palpations: Abdomen is soft.     Tenderness: There is no abdominal tenderness. There is no rebound.  Musculoskeletal:        General: No swelling. Normal range of motion.  Skin:    General: Skin is warm and dry.     Findings: No rash.  Neurological:     Mental Status: She is alert and oriented to person, place, and time.  Psychiatric:        Mood and Affect: Mood normal.        Behavior: Behavior normal.      GU / Detailed Urogynecologic Evaluation:  Pelvic Exam: Normal external female genitalia; Bartholin's and Skene's glands normal in appearance; urethral meatus normal in appearance, no urethral masses or discharge.   CST: negative  Speculum exam reveals normal vaginal mucosa with atrophy. Cervix normal appearance. Uterus normal single, nontender. Adnexa no mass, fullness, tenderness.    Pelvic floor strength I/V  Pelvic floor musculature: Right levator non-tender, Right obturator tender, Left levator non-tender, Left obturator tender  POP-Q:   POP-Q  -2                                            Aa   -2                                           Ba  -5                                              C   5                                            Gh  3                                             Pb  7.5                                            tvl   1.5                                            Ap  1.5  Bp  -5.5                                              D      Rectal Exam:  Normal external rectum  Post-Void Residual (PVR)  In order to evaluate bladder emptying, we discussed obtaining a postvoid residual and she agreed to this procedure.  Procedure: Urethra was prepped with betadine and straight catheter placed. A PVR of 50 ml was obtained.  Laboratory Results: POC urine: trace blood   ASSESSMENT AND PLAN Ms. Fohl is a 81 y.o. with:  1. Prolapse of posterior vaginal wall   2. Uterovaginal prolapse, incomplete   3. SUI (stress urinary incontinence, female)   4. Urinary frequency   5. Levator spasm    Stage I anterior, Stage II- III posterior, Stage I apical prolapse - For treatment of pelvic organ prolapse, we discussed options for management including expectant management, conservative management, and surgical management, such as Kegels, a pessary, pelvic floor physical therapy, and specific surgical procedures. - She is interested in surgery. Recommend posterior repair, perineorrhaphy, SSLF  2. SUI - For treatment of stress urinary incontinence,  non-surgical options include expectant management, weight loss, physical therapy, as well as a pessary.  Surgical options include a midurethral sling, Burch urethropexy, and transurethral injection of a bulking agent. - Interested in sling at time of prolapse repair.  - Return for simple CMG to demonstrate leakage.   3. Levator spasm - could be contributing to pelvic discomfort - we discussed referral to pelvic PT but she declines at this time  Request sent for surgery scheduling. Return for simple CMG and pre op   Jaquita Folds, MD

## 2022-08-01 NOTE — Telephone Encounter (Signed)
Pt was seen on 07/25/22 and has a return appt scheduled for 09/03/22.

## 2022-08-14 ENCOUNTER — Encounter: Payer: Self-pay | Admitting: Obstetrics and Gynecology

## 2022-08-15 ENCOUNTER — Telehealth: Payer: Self-pay | Admitting: *Deleted

## 2022-08-15 NOTE — Telephone Encounter (Signed)
Pt has been scheduled for tele pre op appt 09/01/22 @ 10:20. Med rec and consent are done.

## 2022-08-15 NOTE — Telephone Encounter (Signed)
Pt has been scheduled for tele pre op appt 09/01/22 @ 10:20. Med rec and consent are done.      Patient Consent for Virtual Visit        [name] has provided verbal consent on 08/15/2022 for a virtual visit (video or telephone).   CONSENT FOR VIRTUAL VISIT FOR:  [name]  By participating in this virtual visit I agree to the following:  I hereby voluntarily request, consent and authorize [facility] and its employed or contracted physicians, physician assistants, nurse practitioners or other licensed health care professionals (the Practitioner), to provide me with telemedicine health care services (the "Services") as deemed necessary by the treating Practitioner. I acknowledge and consent to receive the Services by the Practitioner via telemedicine. I understand that the telemedicine visit will involve communicating with the Practitioner through live audiovisual communication technology and the disclosure of certain medical information by electronic transmission. I acknowledge that I have been given the opportunity to request an in-person assessment or other available alternative prior to the telemedicine visit and am voluntarily participating in the telemedicine visit.  I understand that I have the right to withhold or withdraw my consent to the use of telemedicine in the course of my care at any time, without affecting my right to future care or treatment, and that the Practitioner or I may terminate the telemedicine visit at any time. I understand that I have the right to inspect all information obtained and/or recorded in the course of the telemedicine visit and may receive copies of available information for a reasonable fee.  I understand that some of the potential risks of receiving the Services via telemedicine include:  Delay or interruption in medical evaluation due to technological equipment failure or disruption; Information transmitted may not be sufficient (e.g.  poor resolution of images) to allow for appropriate medical decision making by the Practitioner; and/or  In rare instances, security protocols could fail, causing a breach of personal health information.  Furthermore, I acknowledge that it is my responsibility to provide information about my medical history, conditions and care that is complete and accurate to the best of my ability. I acknowledge that Practitioner's advice, recommendations, and/or decision may be based on factors not within their control, such as incomplete or inaccurate data provided by me or distortions of diagnostic images or specimens that may result from electronic transmissions. I understand that the practice of medicine is not an exact science and that Practitioner makes no warranties or guarantees regarding treatment outcomes. I acknowledge that a copy of this consent can be made available to me via my patient portal ([facility]), or I can request a printed copy by calling the office of [facility].    I understand that my insurance will be billed for this visit.   I have read or had this consent read to me. I understand the contents of this consent, which adequately explains the benefits and risks of the Services being provided via telemedicine.  I have been provided ample opportunity to ask questions regarding this consent and the Services and have had my questions answered to my satisfaction. I give my informed consent for the services to be provided through the use of telemedicine in my medical care

## 2022-08-15 NOTE — Telephone Encounter (Signed)
   Pre-operative Risk Assessment    Patient Name: [name]  DOB: 16-May-1941 MRN: [id number]      Request for Surgical Clearance    Procedure:   POSTERIOR REPAIR WITH PERINEORRHAPHY AND SACROSPINOUS FIXATION  Date of Surgery:  Clearance 09/18/22                                 Surgeon:  DR. Sherlene Shams Surgeon's Group or Practice Name:  [facility] Phone number:  [phone] Fax number:  [phone]   Type of Clearance Requested:   - Medical ; NO MEDICATIONS LISTED AS NEEDING TO BE HELD   Type of Anesthesia:  General    Additional requests/questions:    Jiles Prows   08/15/2022, 8:15 AM

## 2022-08-15 NOTE — Telephone Encounter (Signed)
   Name: MORRISSA SHEIN  DOB: September 05, 1940  MRN: [id number]  Primary Cardiologist: None   Preoperative team, please contact this patient and set up a phone call appointment for further preoperative risk assessment. Please obtain consent and complete medication review. Thank you for your help.  I confirm that guidance regarding antiplatelet and oral anticoagulation therapy has been completed and, if necessary, noted below.  No medications requested.   Mable Fill, Marissa Nestle, NP 08/15/2022, 10:13 AM [place]

## 2022-09-01 ENCOUNTER — Ambulatory Visit: Payer: Medicare Other

## 2022-09-01 NOTE — Telephone Encounter (Signed)
Pt is scheduled to see Tommye Standard, PA-C, 09/08/22, clearance will be addressed at that time.  Will route to the requesting office to make them aware.

## 2022-09-01 NOTE — Telephone Encounter (Signed)
Pt is aware that she will need an in office appointment with an EP provider.  I have send [name], EP Scheduler a message to see if she can get her in as her surgery is 09/18/22.

## 2022-09-01 NOTE — Telephone Encounter (Signed)
   Name: CLIO GERHART  DOB: October 18, 1940  MRN: [id number]  Primary Cardiologist: None  Chart reviewed as part of pre-operative protocol coverage. Because of Leontina Skidmore Ion's past medical history and time since last visit, she will require a follow-up in-office visit in order to better assess preoperative cardiovascular risk.  Pre-op covering staff: - Please schedule appointment and call patient to inform them. If patient already had an upcoming appointment within acceptable timeframe, please add "pre-op clearance" to the appointment notes so provider is aware. - Please contact requesting surgeon's office via preferred method (i.e, phone, fax) to inform them of need for appointment prior to surgery.   Lenna Sciara, NP  09/01/2022, 8:09 AM

## 2022-09-03 ENCOUNTER — Ambulatory Visit (INDEPENDENT_AMBULATORY_CARE_PROVIDER_SITE_OTHER): Payer: Medicare Other | Admitting: Obstetrics and Gynecology

## 2022-09-03 ENCOUNTER — Encounter: Payer: Self-pay | Admitting: Obstetrics and Gynecology

## 2022-09-03 VITALS — BP 109/65 | HR 69 | Wt 115.0 lb

## 2022-09-03 DIAGNOSIS — N816 Rectocele: Secondary | ICD-10-CM | POA: Diagnosis not present

## 2022-09-03 DIAGNOSIS — N812 Incomplete uterovaginal prolapse: Secondary | ICD-10-CM | POA: Diagnosis not present

## 2022-09-03 DIAGNOSIS — N393 Stress incontinence (female) (male): Secondary | ICD-10-CM | POA: Diagnosis not present

## 2022-09-03 DIAGNOSIS — Z01818 Encounter for other preprocedural examination: Secondary | ICD-10-CM

## 2022-09-03 MED ORDER — ACETAMINOPHEN 500 MG PO TABS: 500.0000 mg | ORAL_TABLET | Freq: Four times a day (QID) | ORAL | 0 refills | Status: DC | PRN

## 2022-09-03 MED ORDER — OXYCODONE HCL 5 MG PO TABS: 5.0000 mg | ORAL_TABLET | ORAL | 0 refills | Status: DC | PRN

## 2022-09-03 MED ORDER — POLYETHYLENE GLYCOL 3350 17 GM/SCOOP PO POWD: 17.0000 g | Freq: Every day | ORAL | 0 refills | Status: DC

## 2022-09-03 MED ORDER — IBUPROFEN 600 MG PO TABS: 600.0000 mg | ORAL_TABLET | Freq: Four times a day (QID) | ORAL | 0 refills | Status: DC | PRN

## 2022-09-03 NOTE — Progress Notes (Signed)
[facility] Pre-Operative visit  Subjective Chief Complaint: [name] presents for a preoperative encounter.   History of Present Illness: [name] is a 82 y.o. female who presents for preoperative visit.  She is scheduled to undergo Exam under anesthesia, posterior repair, perineorrhaphy, sacrospinous hysteropexy, midurethral sling, cystoscopy on 09/18/22.  Her symptoms include vaginal bulge and incontinence, and she was was found to have Stage 0 anterior, Stage III posterior, Stage I apical prolapse.   Simple CMG performed today  Past Medical History:  Diagnosis Date   Adenomatous colon polyp 08/11/2004   Anxiety    Arthritis    Benign esophageal stricture 10/14/2012   C. difficile diarrhea    CERUMEN IMPACTION, BILATERAL 12/13/2007   CERVICAL RADICULOPATHY 11/05/2009   Chronic kidney disease    kidney stones   DEPRESSION 03/11/2007   DIVERTICULOSIS, COLON 03/11/2007   Esophagitis, reflux 10/14/2012   GERD (gastroesophageal reflux disease) 10/14/2012   Glaucoma    GLAUCOMA ASSOCIATED W/UNSPEC OCULAR DISORDER 11/05/2009   Hearing loss    HEMORRHOIDS, EXTERNAL W/O COMPLICATION 08/65/7846   Qualifier: Diagnosis of  By: Jimmye Norman, LPN, Bonnye M    HYPERLIPIDEMIA 09/22/2007   HYPERTENSION 03/11/2007   Irritable bowel syndrome    Kidney stone    Osteopenia 02/09/2015   T score -1.4 FRAX 7.3%/0.6%   Rectocele    Rosacea 03/11/2007   Tachycardia      Past Surgical History:  Procedure Laterality Date   ANTERIOR CERVICAL DECOMP/DISCECTOMY FUSION  08/14/2011   Procedure: ANTERIOR CERVICAL DECOMPRESSION/DISCECTOMY FUSION 1 LEVEL/HARDWARE REMOVAL;  Surgeon: [name], MD;  Location: [facility];  Service: Neurosurgery;  Laterality: N/A;  Cervical Six-Seven anterior cervical decompression with fusion interbody prothesis plating and bonegraft with removal of hardware of Cervical Four to Cervical Six    BIOPSY  09/26/2021   Procedure: BIOPSY;  Surgeon:  Milus Banister, MD;  Location: [facility];  Service: Endoscopy;;   CERVICAL FUSION     C4-6   COLONOSCOPY     multiple   COLONOSCOPY N/A 09/26/2021   Procedure: COLONOSCOPY;  Surgeon: Milus Banister, MD;  Location: [facility];  Service: Endoscopy;  Laterality: N/A;   ESOPHAGOGASTRODUODENOSCOPY  10/14/2012   Dr. [name]   KIDNEY SURGERY     age 76- kidney stone removal   TONSILLECTOMY      is allergic to codeine.   Family History  Problem Relation Age of Onset   Mental illness Mother        comitted suicide   Glaucoma Mother    Lung cancer Father        smoker   Hypertension Father    Heart disease Father        age 75, smoker   Cerebral palsy Sister    Stroke Sister        multiple   Pancreatic cancer Daughter    Heart attack Son        80   Heart attack Son        25   Stroke Paternal Aunt    Stroke Paternal Aunt    Stroke Paternal Uncle    Stroke Paternal Uncle    Stroke Paternal Uncle    Diabetes Maternal Grandmother    Colon cancer Neg Hx    Stomach cancer Neg Hx    Esophageal cancer Neg Hx    Rectal cancer Neg Hx     Social History   Tobacco Use   Smoking status: Never  Smokeless tobacco: Never  Vaping Use   Vaping Use: Never used  Substance Use Topics   Alcohol use: No    Alcohol/week: 0.0 standard drinks of alcohol   Drug use: No     Review of Systems was negative for a full 10 system review except as noted in the History of Present Illness.   Current Outpatient Medications:    acetaminophen (TYLENOL) 500 MG tablet, Take 1,000 mg by mouth at bedtime as needed for moderate pain (Sleep)., Disp: , Rfl:    albuterol (VENTOLIN HFA) 108 (90 Base) MCG/ACT inhaler, Inhale 2 puffs into the lungs every 6 (six) hours as needed for wheezing or shortness of breath (or cough)., Disp: 1 each, Rfl: 0   ALPRAZolam (XANAX) 0.25 MG tablet, TAKE 1 TABLET (0.25 MG TOTAL) BY MOUTH DAILY AS NEEDED. FOR ANXIETY, Disp: 30 tablet, Rfl: 1   Calcium  Carbonate-Vitamin D (CALCIUM + D PO), Take 600 mg by mouth daily., Disp: , Rfl:    diltiazem (CARDIZEM CD) 240 MG 24 hr capsule, TAKE 1 CAPSULE BY MOUTH EVERY DAY, Disp: 90 capsule, Rfl: 3   dorzolamide-timolol (COSOPT) 22.3-6.8 MG/ML ophthalmic solution, Place 1 drop into both eyes 2 (two) times daily., Disp: , Rfl:    escitalopram (LEXAPRO) 20 MG tablet, TAKE 1 TABLET BY MOUTH EVERY DAY, Disp: 90 tablet, Rfl: 3   hydrochlorothiazide (HYDRODIURIL) 25 MG tablet, TAKE 1 TABLET BY MOUTH EVERY DAY, Disp: 90 tablet, Rfl: 3   melatonin 5 MG TABS, Take 5 mg by mouth at bedtime as needed (Sleep)., Disp: , Rfl:    rosuvastatin (CRESTOR) 10 MG tablet, TAKE 1 TABLET (10 MG TOTAL) BY MOUTH ONCE A WEEK., Disp: 12 tablet, Rfl: 4   valsartan (DIOVAN) 320 MG tablet, TAKE 1 TABLET BY MOUTH EVERY DAY, Disp: 90 tablet, Rfl: 3   Objective Vitals:   09/03/22 1105  BP: 109/65  Pulse: 69    Gen: NAD CV: S1 S2 RRR Lungs: Clear to auscultation bilaterally Abd: soft, nontender   Previous Pelvic Exam showed: POP-Q (07/25/22)   -2                                            Aa   -2                                           Ba   -5                                              C    5                                            Gh   3                                            Pb   7.5  tvl    1.5                                            Ap   1.5                                            Bp   -5.5                                              D     Verbal consent was obtained to perform simple CMG procedure:   Prolapse was reduced using 2 large cotton swabs. Urethra was prepped with betadine and a 85F catheter was placed and bladder was drained completely. The bladder was then backfilled with sterile water by gravity.  First sensation: 142m First Desire: 1353mStrong Desire: 20065mapacity: 325m30mugh stress test was positive in the standing  position. Valsalva stress test was negative.  She was was allowed to void on her own.   Interpretation: CMG showed normal sensation, and normal cystometric capacity. Findings positive for stress incontinence, negative for detrusor overactivity.      Assessment/ Plan  Assessment: The patient is a 81 y31. year old scheduled to undergo Exam under anesthesia, posterior repair, perineorrhaphy, sacrospinous hysteropexy, midurethral sling, cystoscopy. Verbal consent was obtained for these procedures.  Plan: General Surgical Consent: The patient has previously been counseled on alternative treatments, and the decision by the patient and provider was to proceed with the procedure listed above.  For all procedures, there are risks of bleeding, infection, damage to surrounding organs including but not limited to bowel, bladder, blood vessels, ureters and nerves, and need for further surgery if an injury were to occur. These risks are all low with minimally invasive surgery.   There are risks of numbness and weakness at any body site or buttock/rectal pain.  It is possible that baseline pain can be worsened by surgery, either with or without mesh. If surgery is vaginal, there is also a low risk of possible conversion to laparoscopy or open abdominal incision where indicated. Very rare risks include blood transfusion, blood clot, heart attack, pneumonia, or death.   There is also a risk of short-term postoperative urinary retention with need to use a catheter. About half of patients need to go home from surgery with a catheter, which is then later removed in the office. The risk of long-term need for a catheter is very low. There is also a risk of worsening of overactive bladder.   Sling: The effectiveness of a midurethral vaginal mesh sling is approximately 85%, and thus, there will be times when you may leak urine after surgery, especially if your bladder is full or if you have a strong cough. There is  a balance between making the sling tight enough to treat your leakage but not too tight so that you have long-term difficulty emptying your bladder. A mesh sling will not directly treat overactive bladder/urge incontinence and may worsen it.  There is an FDA safety notification on vaginal mesh procedures for prolapse but NOT mesh slings. We have extensive experience and training with mesh placement  and we have close postoperative follow up to identify any potential complications from mesh. It is important to realize that this mesh is a permanent implant that cannot be easily removed. There are rare risks of mesh exposure (2-4%), pain with intercourse (0-7%), and infection (<1%). The risk of mesh exposure if more likely in a woman with risks for poor healing (prior radiation, poorly controlled diabetes, or immunocompromised). The risk of new or worsened chronic pain after mesh implant is more common in women with baseline chronic pain and/or poorly controlled anxiety or depression. Approximately 2-4% of patients will experience longer-term post-operative voiding dysfunction that may require surgical revision of the sling. We also reviewed that postoperatively, her stream may not be as strong as before surgery.    Prolapse (with or without mesh): Risk factors for surgical failure  include things that put pressure on your pelvis and the surgical repair, including obesity, chronic cough, and heavy lifting or straining (including lifting children or adults, straining on the toilet, or lifting heavy objects such as furniture or anything weighing >25 lbs. Risks of recurrence is 20-30% with vaginal native tissue repair and a less than 10% with sacrocolpopexy with mesh.    We discussed consent for blood products. Risks for blood transfusion include allergic reactions, other reactions that can affect different body organs and managed accordingly, transmission of infectious diseases such as HIV or Hepatitis. However, the  blood is screened. Patient consents for blood products.  Pre-operative instructions:  She was instructed to not take Aspirin/NSAIDs x 7days prior to surgery.  Antibiotic prophylaxis was ordered as indicated.  Catheter use: Patient will go home with foley if needed after post-operative voiding trial.  Post-operative instructions:  She was provided with specific post-operative instructions, including precautions and signs/symptoms for which we would recommend contacting us, in addition to daytime and after-hours contact phone numbers. This was provided on a handout.   Post-operative medications: Prescriptions for motrin, tylenol, miralax, and oxycodone were sent to her pharmacy. Discussed using ibuprofen and tylenol on a schedule to limit use of narcotics.   Laboratory testing:  per anesthesia  Preoperative clearance:  She does require surgical clearance- has an appt for cardiac clearance on 09/08/22.    Post-operative follow-up:  A post-operative appointment will be made for 6 weeks from the date of surgery. If she needs a post-operative nurse visit for a voiding trial, that will be set up after she leaves the hospital.    Patient will call the clinic or use MyChart should anything change or any new issues arise.   Jaquita Folds, MD   Time spent: I spent 25 minutes dedicated to the care of this patient on the date of this encounter to include pre-visit review of records, face-to-face time with the patient discussing surgery and post visit documentation and ordering medication/ testing. Additional time was spent on the procedure.

## 2022-09-04 NOTE — H&P (Signed)
[facility] Pre-Operative H&P  Subjective Chief Complaint: [name] presents for a preoperative encounter.   History of Present Illness: [name] is a 82 y.o. female who presents for preoperative visit.  She is scheduled to undergo Exam under anesthesia, posterior repair, perineorrhaphy, sacrospinous hysteropexy, midurethral sling, cystoscopy on 09/18/22.  Her symptoms include vaginal bulge and incontinence, and she was was found to have Stage 0 anterior, Stage III posterior, Stage I apical prolapse.   Simple CMG performed today  Past Medical History:  Diagnosis Date   Adenomatous colon polyp 08/11/2004   Anxiety    Arthritis    Benign esophageal stricture 10/14/2012   C. difficile diarrhea    CERUMEN IMPACTION, BILATERAL 12/13/2007   CERVICAL RADICULOPATHY 11/05/2009   Chronic kidney disease    kidney stones   DEPRESSION 03/11/2007   DIVERTICULOSIS, COLON 03/11/2007   Esophagitis, reflux 10/14/2012   GERD (gastroesophageal reflux disease) 10/14/2012   Glaucoma    GLAUCOMA ASSOCIATED W/UNSPEC OCULAR DISORDER 11/05/2009   Hearing loss    HEMORRHOIDS, EXTERNAL W/O COMPLICATION 58/85/0277   Qualifier: Diagnosis of  By: Jimmye Norman, LPN, Bonnye M    HYPERLIPIDEMIA 09/22/2007   HYPERTENSION 03/11/2007   Irritable bowel syndrome    Kidney stone    Osteopenia 02/09/2015   T score -1.4 FRAX 7.3%/0.6%   Rectocele    Rosacea 03/11/2007   Tachycardia      Past Surgical History:  Procedure Laterality Date   ANTERIOR CERVICAL DECOMP/DISCECTOMY FUSION  08/14/2011   Procedure: ANTERIOR CERVICAL DECOMPRESSION/DISCECTOMY FUSION 1 LEVEL/HARDWARE REMOVAL;  Surgeon: [name], MD;  Location: [facility];  Service: Neurosurgery;  Laterality: N/A;  Cervical Six-Seven anterior cervical decompression with fusion interbody prothesis plating and bonegraft with removal of hardware of Cervical Four to Cervical Six    BIOPSY  09/26/2021   Procedure: BIOPSY;  Surgeon:  Milus Banister, MD;  Location: [facility];  Service: Endoscopy;;   CERVICAL FUSION     C4-6   COLONOSCOPY     multiple   COLONOSCOPY N/A 09/26/2021   Procedure: COLONOSCOPY;  Surgeon: Milus Banister, MD;  Location: [facility];  Service: Endoscopy;  Laterality: N/A;   ESOPHAGOGASTRODUODENOSCOPY  10/14/2012   Dr. [name]   KIDNEY SURGERY     age 36- kidney stone removal   TONSILLECTOMY      is allergic to codeine.   Family History  Problem Relation Age of Onset   Mental illness Mother        comitted suicide   Glaucoma Mother    Lung cancer Father        smoker   Hypertension Father    Heart disease Father        age 34, smoker   Cerebral palsy Sister    Stroke Sister        multiple   Pancreatic cancer Daughter    Heart attack Son        104   Heart attack Son        27   Stroke Paternal Aunt    Stroke Paternal Aunt    Stroke Paternal Uncle    Stroke Paternal Uncle    Stroke Paternal Uncle    Diabetes Maternal Grandmother    Colon cancer Neg Hx    Stomach cancer Neg Hx    Esophageal cancer Neg Hx    Rectal cancer Neg Hx     Social History   Tobacco Use   Smoking status: Never  Smokeless tobacco: Never  Vaping Use   Vaping Use: Never used  Substance Use Topics   Alcohol use: No    Alcohol/week: 0.0 standard drinks of alcohol   Drug use: No     Review of Systems was negative for a full 10 system review except as noted in the History of Present Illness.  No current facility-administered medications for this encounter.  Current Outpatient Medications:    acetaminophen (TYLENOL) 500 MG tablet, Take 1,000 mg by mouth at bedtime as needed for moderate pain (Sleep)., Disp: , Rfl:    acetaminophen (TYLENOL) 500 MG tablet, Take 1 tablet (500 mg total) by mouth every 6 (six) hours as needed (pain)., Disp: 30 tablet, Rfl: 0   albuterol (VENTOLIN HFA) 108 (90 Base) MCG/ACT inhaler, Inhale 2 puffs into the lungs every 6 (six) hours as needed for  wheezing or shortness of breath (or cough)., Disp: 1 each, Rfl: 0   ALPRAZolam (XANAX) 0.25 MG tablet, TAKE 1 TABLET (0.25 MG TOTAL) BY MOUTH DAILY AS NEEDED. FOR ANXIETY, Disp: 30 tablet, Rfl: 1   Calcium Carbonate-Vitamin D (CALCIUM + D PO), Take 600 mg by mouth daily., Disp: , Rfl:    diltiazem (CARDIZEM CD) 240 MG 24 hr capsule, TAKE 1 CAPSULE BY MOUTH EVERY DAY, Disp: 90 capsule, Rfl: 3   dorzolamide-timolol (COSOPT) 22.3-6.8 MG/ML ophthalmic solution, Place 1 drop into both eyes 2 (two) times daily., Disp: , Rfl:    escitalopram (LEXAPRO) 20 MG tablet, TAKE 1 TABLET BY MOUTH EVERY DAY, Disp: 90 tablet, Rfl: 3   hydrochlorothiazide (HYDRODIURIL) 25 MG tablet, TAKE 1 TABLET BY MOUTH EVERY DAY, Disp: 90 tablet, Rfl: 3   ibuprofen (ADVIL) 600 MG tablet, Take 1 tablet (600 mg total) by mouth every 6 (six) hours as needed., Disp: 30 tablet, Rfl: 0   melatonin 5 MG TABS, Take 5 mg by mouth at bedtime as needed (Sleep)., Disp: , Rfl:    oxyCODONE (OXY IR/ROXICODONE) 5 MG immediate release tablet, Take 1 tablet (5 mg total) by mouth every 4 (four) hours as needed for severe pain., Disp: 5 tablet, Rfl: 0   polyethylene glycol powder (GLYCOLAX/MIRALAX) 17 GM/SCOOP powder, Take 17 g by mouth daily. Drink 17g (1 scoop) dissolved in water per day., Disp: 255 g, Rfl: 0   rosuvastatin (CRESTOR) 10 MG tablet, TAKE 1 TABLET (10 MG TOTAL) BY MOUTH ONCE A WEEK., Disp: 12 tablet, Rfl: 4   valsartan (DIOVAN) 320 MG tablet, TAKE 1 TABLET BY MOUTH EVERY DAY, Disp: 90 tablet, Rfl: 3   Objective There were no vitals filed for this visit.   Gen: NAD CV: S1 S2 RRR Lungs: Clear to auscultation bilaterally Abd: soft, nontender   Previous Pelvic Exam showed: POP-Q (07/25/22)   -2                                            Aa   -2                                           Ba   -5  C    5                                            Gh   3                                             Pb   7.5                                            tvl    1.5                                            Ap   1.5                                            Bp   -5.5                                              D     Verbal consent was obtained to perform simple CMG procedure:   Prolapse was reduced using 2 large cotton swabs. Urethra was prepped with betadine and a 24F catheter was placed and bladder was drained completely. The bladder was then backfilled with sterile water by gravity.  First sensation: 140m First Desire: 1318mStrong Desire: 20062mapacity: 325m75mugh stress test was positive in the standing position. Valsalva stress test was negative.  She was was allowed to void on her own.   Interpretation: CMG showed normal sensation, and normal cystometric capacity. Findings positive for stress incontinence, negative for detrusor overactivity.      Assessment/ Plan  The patient is a 81 y48. year old with prolapse and incontinence scheduled to undergo Exam under anesthesia, posterior repair, perineorrhaphy, sacrospinous hysteropexy, midurethral sling, cystoscopy.    MichJaquita Folds

## 2022-09-06 NOTE — Progress Notes (Unsigned)
Cardiology Office Note Date:  09/06/2022  Patient ID:  Juline, Sanderford 22-May-1941, MRN [id number] PCP:  [name], MD  Electrophysiologist: [name]  ***refresh   Chief Complaint: *** pre-op  History of Present Illness: [name] is a 82 y.o. female with history of SVT, HTN, HLD  She saw [name] 05/13/21, She had not had symptoms of her SVT, mentions some rare postural dizziness.  Her HCTZ weaned > stopped with consideration of reduction of her dilt dose at the next visit.  Needs pre-op evaluation, pending POSTERIOR REPAIR WITH PERINEORRHAPHY AND SACROSPINOUS FIXATION scheduled for 09/18/22.  RCRI score is one (0.9%)  *** symptoms *** orthostatic symptoms?   Past Medical History:  Diagnosis Date   Adenomatous colon polyp 08/11/2004   Anxiety    Arthritis    Benign esophageal stricture 10/14/2012   C. difficile diarrhea    CERUMEN IMPACTION, BILATERAL 12/13/2007   CERVICAL RADICULOPATHY 11/05/2009   Chronic kidney disease    kidney stones   DEPRESSION 03/11/2007   DIVERTICULOSIS, COLON 03/11/2007   Esophagitis, reflux 10/14/2012   GERD (gastroesophageal reflux disease) 10/14/2012   Glaucoma    GLAUCOMA ASSOCIATED W/UNSPEC OCULAR DISORDER 11/05/2009   Hearing loss    HEMORRHOIDS, EXTERNAL W/O COMPLICATION 56/21/3086   Qualifier: Diagnosis of  By: Jimmye Norman, LPN, Bonnye M    HYPERLIPIDEMIA 09/22/2007   HYPERTENSION 03/11/2007   Irritable bowel syndrome    Kidney stone    Osteopenia 02/09/2015   T score -1.4 FRAX 7.3%/0.6%   Rectocele    Rosacea 03/11/2007   Tachycardia     Past Surgical History:  Procedure Laterality Date   ANTERIOR CERVICAL DECOMP/DISCECTOMY FUSION  08/14/2011   Procedure: ANTERIOR CERVICAL DECOMPRESSION/DISCECTOMY FUSION 1 LEVEL/HARDWARE REMOVAL;  Surgeon: [name], MD;  Location: [facility];  Service: Neurosurgery;  Laterality: N/A;  Cervical Six-Seven anterior cervical decompression with fusion interbody  prothesis plating and bonegraft with removal of hardware of Cervical Four to Cervical Six    BIOPSY  09/26/2021   Procedure: BIOPSY;  Surgeon: Milus Banister, MD;  Location: [facility];  Service: Endoscopy;;   CERVICAL FUSION     C4-6   COLONOSCOPY     multiple   COLONOSCOPY N/A 09/26/2021   Procedure: COLONOSCOPY;  Surgeon: Milus Banister, MD;  Location: [facility];  Service: Endoscopy;  Laterality: N/A;   ESOPHAGOGASTRODUODENOSCOPY  10/14/2012   Dr. [name]   KIDNEY SURGERY     age 77- kidney stone removal   TONSILLECTOMY      Current Outpatient Medications  Medication Sig Dispense Refill   acetaminophen (TYLENOL) 500 MG tablet Take 1,000 mg by mouth at bedtime as needed for moderate pain (Sleep).     acetaminophen (TYLENOL) 500 MG tablet Take 1 tablet (500 mg total) by mouth every 6 (six) hours as needed (pain). 30 tablet 0   albuterol (VENTOLIN HFA) 108 (90 Base) MCG/ACT inhaler Inhale 2 puffs into the lungs every 6 (six) hours as needed for wheezing or shortness of breath (or cough). 1 each 0   ALPRAZolam (XANAX) 0.25 MG tablet TAKE 1 TABLET (0.25 MG TOTAL) BY MOUTH DAILY AS NEEDED. FOR ANXIETY 30 tablet 1   Calcium Carbonate-Vitamin D (CALCIUM + D PO) Take 600 mg by mouth daily.     diltiazem (CARDIZEM CD) 240 MG 24 hr capsule TAKE 1 CAPSULE BY MOUTH EVERY DAY 90 capsule 3   dorzolamide-timolol (COSOPT) 22.3-6.8 MG/ML ophthalmic solution Place 1 drop into  both eyes 2 (two) times daily.     escitalopram (LEXAPRO) 20 MG tablet TAKE 1 TABLET BY MOUTH EVERY DAY 90 tablet 3   hydrochlorothiazide (HYDRODIURIL) 25 MG tablet TAKE 1 TABLET BY MOUTH EVERY DAY 90 tablet 3   ibuprofen (ADVIL) 600 MG tablet Take 1 tablet (600 mg total) by mouth every 6 (six) hours as needed. 30 tablet 0   melatonin 5 MG TABS Take 5 mg by mouth at bedtime as needed (Sleep).     oxyCODONE (OXY IR/ROXICODONE) 5 MG immediate release tablet Take 1 tablet (5 mg total) by mouth every 4 (four) hours as  needed for severe pain. 5 tablet 0   polyethylene glycol powder (GLYCOLAX/MIRALAX) 17 GM/SCOOP powder Take 17 g by mouth daily. Drink 17g (1 scoop) dissolved in water per day. 255 g 0   rosuvastatin (CRESTOR) 10 MG tablet TAKE 1 TABLET (10 MG TOTAL) BY MOUTH ONCE A WEEK. 12 tablet 4   valsartan (DIOVAN) 320 MG tablet TAKE 1 TABLET BY MOUTH EVERY DAY 90 tablet 3   No current facility-administered medications for this visit.    Allergies:   Codeine   Social History:  The patient  reports that she has never smoked. She has never used smokeless tobacco. She reports that she does not drink alcohol and does not use drugs.   Family History:  The patient's family history includes Cerebral palsy in her sister; Diabetes in her maternal grandmother; Glaucoma in her mother; Heart attack in her son and son; Heart disease in her father; Hypertension in her father; Lung cancer in her father; Mental illness in her mother; Pancreatic cancer in her daughter; Stroke in her paternal aunt, paternal aunt, paternal uncle, paternal uncle, paternal uncle, and sister.  ROS:  Please see the history of present illness.    All other systems are reviewed and otherwise negative.   PHYSICAL EXAM:  VS:  LMP 08/12/1991  BMI: There is no height or weight on file to calculate BMI. Well nourished, well developed, in no acute distress HEENT: normocephalic, atraumatic Neck: no JVD, carotid bruits or masses Cardiac:  *** RRR; no significant murmurs, no rubs, or gallops Lungs:  *** CTA b/l, no wheezing, rhonchi or rales Abd: soft, nontender MS: no deformity or *** atrophy Ext: *** no edema Skin: warm and dry, no rash Neuro:  No gross deficits appreciated Psych: euthymic mood, full affect   EKG:  Done today and reviewed by myself shows  ***  08/16/2011: TTE Study Conclusions  Left ventricle: The cavity size was normal. Systolic  function was normal. The estimated ejection fraction was in  the range of 55% to 65%. Wall  motion was normal; there were  no regional wall motion abnormalities. Doppler parameters  are consistent with abnormal left ventricular relaxation  (grade 1 diastolic dysfunction).     Recent Labs: 02/13/2022: ALT 10; BUN 13; Creatinine, Ser 0.78; Hemoglobin 12.9; Magnesium 1.7; Platelets 298.0; Potassium 3.4; Sodium 141  No results found for requested labs within last 365 days.   CrCl cannot be calculated (Patient's most recent lab result is older than the maximum 21 days allowed.).   Wt Readings from Last 3 Encounters:  09/03/22 115 lb (52.2 kg)  07/25/22 115 lb (52.2 kg)  05/23/22 118 lb (53.5 kg)     Other studies reviewed: Additional studies/records reviewed today include: summarized above  ASSESSMENT AND PLAN:  SVT ***  HTN ***  Pre-op Low cardiac risk surgery Low cardiac risk score ***  Disposition:  F/u with ***  Current medicines are reviewed at length with the patient today.  The patient did not have any concerns regarding medicines.  Venetia Night, PA-C 09/06/2022 9:28 AM     [address] [place] [address] [phone] (office)  [phone] (fax)

## 2022-09-08 ENCOUNTER — Encounter: Payer: Self-pay | Admitting: Physician Assistant

## 2022-09-08 ENCOUNTER — Ambulatory Visit: Payer: Medicare Other | Attending: Physician Assistant | Admitting: Physician Assistant

## 2022-09-08 VITALS — BP 96/60 | HR 71 | Ht 61.0 in | Wt 117.0 lb

## 2022-09-08 DIAGNOSIS — I471 Supraventricular tachycardia, unspecified: Secondary | ICD-10-CM

## 2022-09-08 DIAGNOSIS — I10 Essential (primary) hypertension: Secondary | ICD-10-CM | POA: Diagnosis not present

## 2022-09-08 DIAGNOSIS — Z01818 Encounter for other preprocedural examination: Secondary | ICD-10-CM | POA: Diagnosis not present

## 2022-09-08 NOTE — Patient Instructions (Signed)
Medication Instructions:  Your physician recommends that you continue on your current medications as directed. Please refer to the Current Medication list given to you today.   *If you need a refill on your cardiac medications before your next appointment, please call your pharmacy*   Lab Work: [place]    If you have labs (blood work) drawn today and your tests are completely normal, you will receive your results only by: [place] (if you have MyChart) OR A paper copy in the mail If you have any lab test that is abnormal or we need to change your treatment, we will call you to review the results.   Testing/Procedures:  NONE ORDERED  TODAY     Follow-Up: At [facility], you and your health needs are our priority.  As part of our continuing mission to provide you with exceptional heart care, we have created designated Provider Care Teams.  These Care Teams include your primary Cardiologist (physician) and Advanced Practice Providers (APPs -  Physician Assistants and Nurse Practitioners) who all work together to provide you with the care you need, when you need it.  We recommend signing up for the patient portal called "MyChart".  Sign up information is provided on this After Visit Summary.  MyChart is used to connect with patients for Virtual Visits (Telemedicine).  Patients are able to view lab/test results, encounter notes, upcoming appointments, etc.  Non-urgent messages can be sent to your provider as well.   To learn more about what you can do with MyChart, go to [email or web address].    Your next appointment:   1 year(s)  Provider:   You may see Dr. Curt Bears     Other Instructions

## 2022-09-11 ENCOUNTER — Encounter (HOSPITAL_BASED_OUTPATIENT_CLINIC_OR_DEPARTMENT_OTHER): Payer: Self-pay | Admitting: Obstetrics and Gynecology

## 2022-09-11 NOTE — Progress Notes (Signed)
Spoke w/ via phone for pre-op interview--- pt Lab needs dos----  [employer] results------ current EKG in epic/ chart COVID test -----patient states asymptomatic no test needed Arrive at ------- 0700 on 09-18-2022 NPO after MN NO Solid Food.  Clear liquids from MN until--- 0600 Med rec completed Medications to take morning of surgery ----- lexapro, cardizem, eye drop as usual Diabetic medication ----- n/a Patient instructed no nail polish to be worn day of surgery Patient instructed to bring photo id and insurance card day of surgery Patient aware to have Driver (ride ) / caregiver    for 24 hours after surgery -- daughter ,lisa Patient Special Instructions ----- n/a Pre-Op special Istructions -----  pt has cardiac office visit clearance by Tommye Standard PA on 09-08-2022 in epic/ chart Patient verbalized understanding of instructions that were given at this phone interview. Patient denies shortness of breath, chest pain, fever, cough at this phone interview.   Anesthesia Review:  HTN;  hx SVT Pt denies cardiac s&s and no sob.  PCP:  Dr Chauncey Cruel. Hunter (lov 08-16-2021) Cardiologist :  Dr Rayann Heman  Cassell Clement 09-08-2022) Chest x-ray :  07-25-2021 EKG :  09-08-2022 Echo : 08-16-2011 Stress test: no Cardiac Cath :  no Activity level: denies sob w/ any activity Blood Thinner/ Instructions Maryjane Hurter Dose: no ASA / Instructions/ Last Dose : no

## 2022-09-18 ENCOUNTER — Other Ambulatory Visit: Payer: Self-pay

## 2022-09-18 ENCOUNTER — Ambulatory Visit (HOSPITAL_BASED_OUTPATIENT_CLINIC_OR_DEPARTMENT_OTHER): Payer: Medicare Other | Admitting: Anesthesiology

## 2022-09-18 ENCOUNTER — Encounter (HOSPITAL_BASED_OUTPATIENT_CLINIC_OR_DEPARTMENT_OTHER)
Admission: RE | Disposition: A | Discharge status: Home / Self Care | Payer: Self-pay | Source: Home / Self Care | Attending: Obstetrics and Gynecology

## 2022-09-18 ENCOUNTER — Ambulatory Visit (HOSPITAL_BASED_OUTPATIENT_CLINIC_OR_DEPARTMENT_OTHER)
Admission: RE | Admit: 2022-09-18 | Discharge: 2022-09-18 | Disposition: A | Discharge status: Home / Self Care | Payer: Medicare Other | Attending: Obstetrics and Gynecology | Admitting: Obstetrics and Gynecology

## 2022-09-18 ENCOUNTER — Encounter (HOSPITAL_BASED_OUTPATIENT_CLINIC_OR_DEPARTMENT_OTHER): Payer: Self-pay | Admitting: Obstetrics and Gynecology

## 2022-09-18 DIAGNOSIS — I10 Essential (primary) hypertension: Secondary | ICD-10-CM | POA: Diagnosis not present

## 2022-09-18 DIAGNOSIS — Z87442 Personal history of urinary calculi: Secondary | ICD-10-CM | POA: Insufficient documentation

## 2022-09-18 DIAGNOSIS — I472 Ventricular tachycardia, unspecified: Secondary | ICD-10-CM | POA: Diagnosis not present

## 2022-09-18 DIAGNOSIS — E785 Hyperlipidemia, unspecified: Secondary | ICD-10-CM | POA: Insufficient documentation

## 2022-09-18 DIAGNOSIS — N816 Rectocele: Secondary | ICD-10-CM | POA: Diagnosis not present

## 2022-09-18 DIAGNOSIS — M1990 Unspecified osteoarthritis, unspecified site: Secondary | ICD-10-CM | POA: Insufficient documentation

## 2022-09-18 DIAGNOSIS — Z01818 Encounter for other preprocedural examination: Secondary | ICD-10-CM

## 2022-09-18 DIAGNOSIS — N189 Chronic kidney disease, unspecified: Secondary | ICD-10-CM | POA: Diagnosis not present

## 2022-09-18 DIAGNOSIS — F418 Other specified anxiety disorders: Secondary | ICD-10-CM | POA: Diagnosis not present

## 2022-09-18 DIAGNOSIS — N812 Incomplete uterovaginal prolapse: Secondary | ICD-10-CM | POA: Diagnosis present

## 2022-09-18 DIAGNOSIS — I129 Hypertensive chronic kidney disease with stage 1 through stage 4 chronic kidney disease, or unspecified chronic kidney disease: Secondary | ICD-10-CM | POA: Insufficient documentation

## 2022-09-18 DIAGNOSIS — K219 Gastro-esophageal reflux disease without esophagitis: Secondary | ICD-10-CM | POA: Diagnosis not present

## 2022-09-18 DIAGNOSIS — E119 Type 2 diabetes mellitus without complications: Secondary | ICD-10-CM | POA: Diagnosis not present

## 2022-09-18 DIAGNOSIS — N393 Stress incontinence (female) (male): Secondary | ICD-10-CM

## 2022-09-18 DIAGNOSIS — F419 Anxiety disorder, unspecified: Secondary | ICD-10-CM | POA: Diagnosis not present

## 2022-09-18 DIAGNOSIS — F32A Depression, unspecified: Secondary | ICD-10-CM | POA: Insufficient documentation

## 2022-09-18 HISTORY — PX: CYSTOSCOPY: SHX5120

## 2022-09-18 HISTORY — DX: Rectocele: N81.6

## 2022-09-18 HISTORY — DX: Other specified postprocedural states: Z85.828

## 2022-09-18 HISTORY — DX: Stress incontinence (female) (male): N39.3

## 2022-09-18 HISTORY — DX: Incomplete uterovaginal prolapse: N81.2

## 2022-09-18 HISTORY — DX: Depression, unspecified: F32.A

## 2022-09-18 HISTORY — DX: Essential (primary) hypertension: I10

## 2022-09-18 HISTORY — DX: Presence of external hearing-aid: Z97.4

## 2022-09-18 HISTORY — DX: Frequency of micturition: R35.0

## 2022-09-18 HISTORY — PX: BLADDER SUSPENSION: SHX72

## 2022-09-18 HISTORY — DX: Personal history of colonic polyps: Z86.010

## 2022-09-18 HISTORY — DX: Other specified postprocedural states: Z98.890

## 2022-09-18 HISTORY — DX: Personal history of adenomatous and serrated colon polyps: Z86.0101

## 2022-09-18 HISTORY — DX: Personal history of urinary calculi: Z87.442

## 2022-09-18 HISTORY — PX: ANTERIOR AND POSTERIOR REPAIR WITH SACROSPINOUS FIXATION: SHX6536

## 2022-09-18 HISTORY — DX: Personal history of other diseases of the digestive system: Z87.19

## 2022-09-18 HISTORY — DX: Diaphragmatic hernia without obstruction or gangrene: K44.9

## 2022-09-18 HISTORY — DX: Type 2 diabetes mellitus without complications: E11.9

## 2022-09-18 HISTORY — DX: Unspecified hemorrhoids: K64.9

## 2022-09-18 HISTORY — DX: Personal history of other infectious and parasitic diseases: Z86.19

## 2022-09-18 HISTORY — DX: Unspecified glaucoma: H40.9

## 2022-09-18 HISTORY — DX: Irritable bowel syndrome with diarrhea: K58.0

## 2022-09-18 LAB — POCT I-STAT, CHEM 8
BUN: 21 mg/dL (ref 8–23)
Calcium, Ion: 1.16 mmol/L (ref 1.15–1.40)
Chloride: 101 mmol/L (ref 98–111)
Creatinine, Ser: 0.7 mg/dL (ref 0.44–1.00)
Glucose, Bld: 129 mg/dL — ABNORMAL HIGH (ref 70–99)
HCT: 43 % (ref 36.0–46.0)
Hemoglobin: 14.6 g/dL (ref 12.0–15.0)
Potassium: 4 mmol/L (ref 3.5–5.1)
Sodium: 141 mmol/L (ref 135–145)
TCO2: 27 mmol/L (ref 22–32)

## 2022-09-18 SURGERY — ANTERIOR AND POSTERIOR REPAIR WITH SACROSPINOUS FIXATION
Anesthesia: General | Site: Vagina

## 2022-09-18 MED ORDER — CEFAZOLIN SODIUM-DEXTROSE 2-4 GM/100ML-% IV SOLN
2.0000 g | INTRAVENOUS | Status: AC
  Administered 2022-09-18: 2 g via INTRAVENOUS

## 2022-09-18 MED ORDER — ACETAMINOPHEN 500 MG PO TABS
ORAL_TABLET | ORAL | Status: AC
  Filled 2022-09-18: qty 2

## 2022-09-18 MED ORDER — CEFAZOLIN SODIUM-DEXTROSE 2-4 GM/100ML-% IV SOLN
INTRAVENOUS | Status: AC
  Filled 2022-09-18: qty 100

## 2022-09-18 MED ORDER — LIDOCAINE-EPINEPHRINE 1 %-1:100000 IJ SOLN
INTRAMUSCULAR | Status: DC | PRN
  Administered 2022-09-18 (×2): 20 mL

## 2022-09-18 MED ORDER — ACETAMINOPHEN 500 MG PO TABS
1000.0000 mg | ORAL_TABLET | Freq: Once | ORAL | Status: AC
  Administered 2022-09-18: 1000 mg via ORAL

## 2022-09-18 MED ORDER — ONDANSETRON HCL 4 MG/2ML IJ SOLN
INTRAMUSCULAR | Status: DC | PRN
  Administered 2022-09-18: 4 mg via INTRAVENOUS

## 2022-09-18 MED ORDER — LIDOCAINE HCL (CARDIAC) PF 100 MG/5ML IV SOSY
PREFILLED_SYRINGE | INTRAVENOUS | Status: DC | PRN
  Administered 2022-09-18: 60 mg via INTRAVENOUS

## 2022-09-18 MED ORDER — PHENAZOPYRIDINE HCL 100 MG PO TABS
ORAL_TABLET | ORAL | Status: AC
  Filled 2022-09-18: qty 2

## 2022-09-18 MED ORDER — DEXAMETHASONE SODIUM PHOSPHATE 4 MG/ML IJ SOLN
INTRAMUSCULAR | Status: DC | PRN
  Administered 2022-09-18: 4 mg via INTRAVENOUS

## 2022-09-18 MED ORDER — LIDOCAINE HCL (PF) 2 % IJ SOLN
INTRAMUSCULAR | Status: AC
  Filled 2022-09-18: qty 5

## 2022-09-18 MED ORDER — AMISULPRIDE (ANTIEMETIC) 5 MG/2ML IV SOLN: 10.0000 mg | Freq: Once | INTRAVENOUS | Status: DC | PRN

## 2022-09-18 MED ORDER — SODIUM CHLORIDE 0.9 % IR SOLN
Status: DC | PRN
  Administered 2022-09-18: 1000 mL via INTRAVESICAL

## 2022-09-18 MED ORDER — GLYCOPYRROLATE 0.2 MG/ML IJ SOLN
INTRAMUSCULAR | Status: DC | PRN
  Administered 2022-09-18: .1 mg via INTRAVENOUS

## 2022-09-18 MED ORDER — PROPOFOL 10 MG/ML IV BOLUS
INTRAVENOUS | Status: DC | PRN
  Administered 2022-09-18: 130 mg via INTRAVENOUS

## 2022-09-18 MED ORDER — EPHEDRINE SULFATE (PRESSORS) 50 MG/ML IJ SOLN
INTRAMUSCULAR | Status: DC | PRN
  Administered 2022-09-18 (×4): 5 mg via INTRAVENOUS

## 2022-09-18 MED ORDER — PHENAZOPYRIDINE HCL 100 MG PO TABS
200.0000 mg | ORAL_TABLET | ORAL | Status: AC
  Administered 2022-09-18: 200 mg via ORAL

## 2022-09-18 MED ORDER — FENTANYL CITRATE (PF) 100 MCG/2ML IJ SOLN: 25.0000 ug | INTRAMUSCULAR | Status: DC | PRN

## 2022-09-18 MED ORDER — LACTATED RINGERS IV SOLN
INTRAVENOUS | Status: DC
  Administered 2022-09-18: 1000 mL via INTRAVENOUS

## 2022-09-18 MED ORDER — ONDANSETRON HCL 4 MG/2ML IJ SOLN
INTRAMUSCULAR | Status: AC
  Filled 2022-09-18: qty 2

## 2022-09-18 MED ORDER — PHENYLEPHRINE 80 MCG/ML (10ML) SYRINGE FOR IV PUSH (FOR BLOOD PRESSURE SUPPORT)
PREFILLED_SYRINGE | INTRAVENOUS | Status: AC
  Filled 2022-09-18: qty 10

## 2022-09-18 MED ORDER — ACETAMINOPHEN 500 MG PO TABS: 1000.0000 mg | ORAL_TABLET | ORAL | Status: DC

## 2022-09-18 MED ORDER — EPHEDRINE 5 MG/ML INJ
INTRAVENOUS | Status: AC
  Filled 2022-09-18: qty 5

## 2022-09-18 MED ORDER — PROMETHAZINE HCL 25 MG/ML IJ SOLN: 6.2500 mg | INTRAMUSCULAR | Status: DC | PRN

## 2022-09-18 MED ORDER — ENOXAPARIN SODIUM 40 MG/0.4ML IJ SOSY
PREFILLED_SYRINGE | INTRAMUSCULAR | Status: AC
  Filled 2022-09-18: qty 0.4

## 2022-09-18 MED ORDER — ENOXAPARIN SODIUM 40 MG/0.4ML IJ SOSY
40.0000 mg | PREFILLED_SYRINGE | INTRAMUSCULAR | Status: AC
  Administered 2022-09-18: 40 mg via SUBCUTANEOUS

## 2022-09-18 MED ORDER — FENTANYL CITRATE (PF) 100 MCG/2ML IJ SOLN
INTRAMUSCULAR | Status: AC
  Filled 2022-09-18: qty 2

## 2022-09-18 MED ORDER — FENTANYL CITRATE (PF) 100 MCG/2ML IJ SOLN
INTRAMUSCULAR | Status: DC | PRN
  Administered 2022-09-18: 75 ug via INTRAVENOUS
  Administered 2022-09-18: 25 ug via INTRAVENOUS

## 2022-09-18 MED ORDER — 0.9 % SODIUM CHLORIDE (POUR BTL) OPTIME
TOPICAL | Status: DC | PRN
  Administered 2022-09-18: 500 mL

## 2022-09-18 SURGICAL SUPPLY — 43 items
ADH SKN CLS APL DERMABOND .7 (GAUZE/BANDAGES/DRESSINGS) ×3
AGENT HMST KT MTR STRL THRMB (HEMOSTASIS)
BLADE CLIPPER SENSICLIP SURGIC (BLADE) ×4 IMPLANT
BLADE SURG 15 STRL LF DISP TIS (BLADE) ×4 IMPLANT
BLADE SURG 15 STRL SS (BLADE) ×3
DERMABOND ADVANCED .7 DNX12 (GAUZE/BANDAGES/DRESSINGS) ×4 IMPLANT
DEVICE CAPIO SLIM SINGLE (INSTRUMENTS) IMPLANT
ELECT REM PT RETURN 9FT ADLT (ELECTROSURGICAL) ×3
ELECTRODE REM PT RTRN 9FT ADLT (ELECTROSURGICAL) IMPLANT
GAUZE 4X4 16PLY ~~LOC~~+RFID DBL (SPONGE) IMPLANT
GLOVE BIOGEL PI IND STRL 6.5 (GLOVE) ×3 IMPLANT
GLOVE BIOGEL PI IND STRL 7.0 (GLOVE) ×4 IMPLANT
GLOVE ECLIPSE 6.0 STRL STRAW (GLOVE) ×3 IMPLANT
GOWN STRL REUS W/TWL LRG LVL3 (GOWN DISPOSABLE) ×4 IMPLANT
HIBICLENS CHG 4% 4OZ BTL (MISCELLANEOUS) ×3 IMPLANT
HOLDER FOLEY CATH W/STRAP (MISCELLANEOUS) ×4 IMPLANT
KIT TURNOVER CYSTO (KITS) ×4 IMPLANT
MANIFOLD NEPTUNE II (INSTRUMENTS) ×4 IMPLANT
NDL MAYO 6 CRC TAPER PT (NEEDLE) IMPLANT
NEEDLE HYPO 22GX1.5 SAFETY (NEEDLE) ×3 IMPLANT
NEEDLE MAYO 6 CRC TAPER PT (NEEDLE) ×3 IMPLANT
NS IRRIG 1000ML POUR BTL (IV SOLUTION) ×3 IMPLANT
PACK CYSTO (CUSTOM PROCEDURE TRAY) ×3 IMPLANT
PACK VAGINAL WOMENS (CUSTOM PROCEDURE TRAY) ×4 IMPLANT
PAD OB MATERNITY 4.3X12.25 (PERSONAL CARE ITEMS) ×3 IMPLANT
RETRACTOR LONE STAR DISPOSABLE (INSTRUMENTS) ×4 IMPLANT
RETRACTOR STAY HOOK 5MM (MISCELLANEOUS) ×4 IMPLANT
SET IRRIG Y TYPE TUR BLADDER L (SET/KITS/TRAYS/PACK) ×3 IMPLANT
SPIKE FLUID TRANSFER (MISCELLANEOUS) ×3 IMPLANT
SUCTION FRAZIER HANDLE 10FR (MISCELLANEOUS) ×3
SUCTION TUBE FRAZIER 10FR DISP (MISCELLANEOUS) ×4 IMPLANT
SURGIFLO W/THROMBIN 8M KIT (HEMOSTASIS) IMPLANT
SUT ABS MONO DBL WITH NDL 48IN (SUTURE) IMPLANT
SUT VIC AB 0 CT1 27 (SUTURE)
SUT VIC AB 0 CT1 27XBRD ANBCTR (SUTURE) IMPLANT
SUT VIC AB 2-0 SH 27 (SUTURE) ×3
SUT VIC AB 2-0 SH 27XBRD (SUTURE) ×4 IMPLANT
SUT VICRYL 2-0 SH 8X27 (SUTURE) ×4 IMPLANT
SYR BULB EAR ULCER 3OZ GRN STR (SYRINGE) ×4 IMPLANT
SYS SLING ADV FIT BLUE TRNSVAG (Sling) IMPLANT
TOWEL OR 17X26 10 PK STRL BLUE (TOWEL DISPOSABLE) ×4 IMPLANT
TRAY FOLEY W/BAG SLVR 14FR (SET/KITS/TRAYS/PACK) ×4 IMPLANT
YANKAUER SUCT BULB TIP NO VENT (SUCTIONS) IMPLANT

## 2022-09-18 NOTE — Anesthesia Procedure Notes (Signed)
Procedure Name: LMA Insertion Date/Time: 09/18/2022 9:40 AM  Performed by: Georgeanne Nim, CRNAPre-anesthesia Checklist: Patient identified, Emergency Drugs available, Suction available, Patient being monitored and Timeout performed Patient Re-evaluated:Patient Re-evaluated prior to induction Oxygen Delivery Method: Circle system utilized Preoxygenation: Pre-oxygenation with 100% oxygen Induction Type: IV induction Ventilation: Mask ventilation without difficulty LMA: LMA inserted LMA Size: 3.0 Number of attempts: 1 Tube secured with: Tape Dental Injury: Teeth and Oropharynx as per pre-operative assessment

## 2022-09-18 NOTE — Interval H&P Note (Signed)
History and Physical Interval Note:  09/18/2022 9:06 AM  [name]  has presented today for surgery, with the diagnosis of posterior vaginal prolapse; uterovaginal prolapse incomplete; stress urinary incontinence.  The various methods of treatment have been discussed with the patient and family. After consideration of risks, benefits and other options for treatment, the patient has consented to  Procedure(s) with comments: POSTERIOR REPAIR WITH PERINEORRHAPHY AND SACROSPINOUS FIXATION (N/A)  TRANSVAGINAL TAPE (TVT) PROCEDURE (POSSIBLE) (N/A) CYSTOSCOPY (N/A) as a surgical intervention.  The patient's history has been reviewed, patient examined, no change in status, stable for surgery.  I have reviewed the patient's chart and labs.  Questions were answered to the patient's satisfaction.     [name]

## 2022-09-18 NOTE — Op Note (Signed)
Operative Note  Preoperative Diagnosis: posterior vaginal prolapse, uterovaginal prolapse, incomplete, and stress urinary incontinence  Postoperative Diagnosis: same  Procedures performed:  Posterior repair, perineorrhaphy, enterocele repair, sacrospinous ligament hysteropexy, midurethral sling, cystoscopy  Implants:  Implant Name Type Inv. Item Serial No. Manufacturer Lot No. LRB No. Used Action  SYS SLING ADV FIT BLUE TRNSVAG - [id number] Sling SYS SLING ADV FIT BLUE TRNSVAG  [place] [id number] N/A 1 Implanted    Attending Surgeon: Sherlene Shams, MD  Anesthesia: General LMA  Findings: 1. On vaginal exam, posterior and apical prolapse noted  2. On cystoscopy, normal bladder and urethra without injury, lesion or foreign body. Brisk bilateral ureteral efflux noted.    Specimens: none  Estimated blood loss: 40 mL  IV fluids: 650 mL  Urine output: 366 mL  Complications: none  Procedure in Detail:  After informed consent was obtained, the patient was taken to the operating room where she was placed under anesthesia.  She was then placed in the dorsal lithotomy position with allen stirrups,  and prepped and draped in the usual sterile fashion.  A strap was placed across her upper abdomen on top of her gown so it was not in direct contact with her skin.  Care was taken to avoid hyperflexion or hyperextension of her upper and lower extremities. A foley catheter was placed.  A lonestar self-retraining retractor was placed with 4 stay hooks. The mid urethral area was located on the anterior vaginal wall.  Two Allis clamps were placed at the level of the midurethra. 1% lidocaine with epinephrine was injected into the vaginal mucosa. A vertical incision was made between the two clamps using a 15-blade scalpel.  Using sharp dissection, Metzenbaum scissors were used to make a periurethral tunnel from the vaginal incision towards the pubic rami bilaterally for the future sling tracts. The  bladder was ensured to be empty. The trocar and attached sling were introduced into the right side of the periurethral vaginal incision, just inferior to the pubic symphysis on the right side. The trocar was guided through the endopelvic fascia and directly vertically.  While hugging the cephalad surface of the pubic bone, the trocar was guided out through the abdomen 2 fingerbreadths lateral to midline at the level of the pubic symphysis on the ipsilateral side. The trocar was placed on the left side in a similar fashion.  A 70-degree cystoscope was introduced, and 360-degree inspection revealed no trauma or trocars in the bladder, with brisk bilateral ureteral efflux.  The bladder was drained and the cystoscope was removed.  The Foley catheter was reinserted.  The sling was brought to lie beneath the mid-urethra.  A needle driver was placed behind the sling to ensure no tension.   The plastic sheath was removed from the sling and the distal ends of the sling were trimmed just below the level of the skin incisions.  Tension-free positioning of the sling was confirmed. Vaginal inspection revealed no vaginotomy or sling perforations of the mucosa.  The vaginal mucosal edges were reapproximated using 2-0 Vicryl.  The suprapubic sling incisions were closed with Dermabond.   Attention was turned to the posterior vagina.  Vaginal exam concluded that an apical procedure was also needed. Two Allis clamps were along the posterior vaginal wall defect up to the cervix, and two allis clamps were placed at the introitus. 1% lidocaine with epinephrine was injected into the vaginal mucosa and over the perineum.  A vertical incision was made between these Allis clamps with  a 15 blade scalpel, and a diamond shaped incision was made over the perineum. Excess perineal skin was removed. Allis clamps were placed along this incision and Metzenbaum scissors were used to undermine the vaginal mucosa along the incision.  The vaginal  mucosa was then sharply dissected off to the septum bilaterally and superiorly to the cervix. A large enterocele was noted.   For the sacrospinous ligament fixation (SSLF), the ischial spine was accessed on the right side via dissection with scissors and blunt dissection.  The sacrospinous ligament was palpated. Two 0 PDS suture was then placed at the sacrospinous ligament two fingerbreadths medial to the ischial spine, in order to avoid the pudendal neurovascular bundle, using a Capio needle driver.  The PDS suture was attached through the posterior cervix and held. The enterocele was reduced with serial pursestring 2-0 vicryl sutures. Posterior plication of the rectovaginal septum was then performed using plicating sutures of 2-0 Vicryl. The last distal stitch incorporated the perineal body in a U stitch fashion. The vaginal mucosal edges were trimmed and the incision reapproximated with 2-0 Vicryl in a running fashion. The SSLF suture was then tied down with excellent support of the posterior and apical vagina.  The perineal body was then reapproximated with two interrupted 0-vicryl sutures. The perineal skin was then closed with a 2-0 vicryl in a running fashion. Irrigation was performed and good hemostasis was noted. Rectal exam was performed to ensure no sutures in the rectum. The patient tolerated the procedure well and was awoken and taken to the recovery room in stable condition.   Jaquita Folds, MD

## 2022-09-18 NOTE — Discharge Instructions (Addendum)
POST OPERATIVE INSTRUCTIONS   Post Anesthesia Home Care Instructions  Activity: Get plenty of rest for the remainder of the day. A responsible adult should stay with you for 24 hours following the procedure.  For the next 24 hours, DO NOT: -Drive a car -Paediatric nurse -Drink alcoholic beverages -Take any medication unless instructed by your physician -Make any legal decisions or sign important papers.  Meals: Start with liquid foods such as gelatin or soup. Progress to regular foods as tolerated. Avoid greasy, spicy, heavy foods. If nausea and/or vomiting occur, drink only clear liquids until the nausea and/or vomiting subsides. Call your physician if vomiting continues.  Special Instructions/Symptoms: Your throat may feel dry or sore from the anesthesia or the breathing tube placed in your throat during surgery. If this causes discomfort, gargle with warm salt water. The discomfort should disappear within 24 hours.    General Instructions Recovery (not bed rest) will last approximately 6 weeks Walking is encouraged, but refrain from strenuous exercise/ housework/ heavy lifting. No lifting >10lbs  Nothing in the vagina- NO intercourse, tampons or douching Bathing:  Do not submerge in water (NO swimming, bath, hot tub, etc) until after your postop visit. You can shower starting the day after surgery.  No driving until you are not taking narcotic pain medicine and until your pain is well enough controlled that you can slam on the breaks or make sudden movements if needed.   Taking your medications Please take your acetaminophen and ibuprofen on a schedule for the first 48 hours. Take '600mg'$  ibuprofen, then take '500mg'$  acetaminophen 3 hours later, then continue to alternate ibuprofen and acetaminophen. That way you are taking each type of medication every 6 hours. Take the prescribed narcotic (oxycodone, tramadol, etc) as needed, with a maximum being every 4 hours.  Take a stool  softener daily to keep your stools soft and preventing you from straining. If you have diarrhea, you decrease your stool softener. This is explained more below. We have prescribed you Miralax.  Reasons to Call the Nurse (see last page for phone numbers) Heavy Bleeding (changing your pad every 1-2 hours) Persistent nausea/vomiting Fever (100.4 degrees or more) Incision problems (pus or other fluid coming out, redness, warmth, increased pain)  Things to Expect After Surgery Mild to Moderate pain is normal during the first day or two after surgery. If prescribed, take Ibuprofen or Tylenol first and use the stronger medicine for "break-through" pain. You can overlap these medicines because they work differently.   Constipation   To Prevent Constipation:  Eat a well-balanced diet including protein, grains, fresh fruit and vegetables.  Drink plenty of fluids. Walk regularly.  Depending on specific instructions from your physician: take Miralax daily and additionally you can add a stool softener (colace/ docusate) and fiber supplement. Continue as long as you're on pain medications.   To Treat Constipation:  If you do not have a bowel movement in 2 days after surgery, you can take 2 Tbs of Milk of Magnesia 1-2 times a day until you have a bowel movement. If diarrhea occurs, decrease the amount or stop the laxative. If no results with Milk of Magnesia, you can drink a bottle of magnesium citrate which you can purchase over the counter.  Fatigue:  This is a normal response to surgery and will improve with time.  Plan frequent rest periods throughout the day.  Gas Pain:  This is very common but can also be very painful! Drink warm liquids such as herbal  teas, bouillon or soup. Walking will help you pass more gas.  Mylicon or Gas-X can be taken over the counter.  Leaking Urine:  Varying amounts of leakage may occur after surgery.  This should improve with time. Your bladder needs at least 3 months to  recover from surgery. If you leak after surgery, be sure to mention this to your doctor at your post-op visit. If you were taking medications for overactive bladder prior to surgery, be sure to restart the medications immediately after surgery.  Incisions: If you have incisions on your abdomen, the skin glue will dissolve on its own over time. It is ok to gently rinse with soap and water over these incisions but do not scrub.  Catheter Approximately 50% of patients are unable to urinate after surgery and need to go home with a catheter. This allows your bladder to rest so it can return to full function. If you go home with a catheter, the office will call to set up a voiding trial a few days after surgery. For most patients, by this visit, they are able to urinate on their own. Long term catheter use is rare.   Return to Work  As work demands and recovery times vary widely, it is hard to predict when you will want to return to work. If you have a desk job with no strenuous physical activity, and if you would like to return sooner than generally recommended, discuss this with your provider or call our office.   Post op concerns  For non-emergent issues, please call the Urogynecology Nurse. Please leave a message and someone will contact you within one business day.  You can also send a message through [place].   AFTER HOURS (After 5:00 PM and on weekends):  For urgent matters that cannot wait until the next business day. Call our office [phone] and connect to the doctor on call.  Please reserve this for important issues.   **FOR ANY TRUE EMERGENCY ISSUES CALL 911 OR GO TO THE NEAREST EMERGENCY ROOM.** Please inform our office or the doctor on call of any emergency.     APPOINTMENTS: Call [phone]

## 2022-09-18 NOTE — Anesthesia Postprocedure Evaluation (Signed)
Anesthesia Post Note  Patient: [name]  Procedure(s) Performed: POSTERIOR REPAIR WITH PERINEORRHAPHY AND SACROSPINOUS FIXATION (Vagina ) TRANSVAGINAL TAPE (TVT) PROCEDURE (POSSIBLE) (Pelvis) CYSTOSCOPY (Bladder)     Patient location during evaluation: PACU Anesthesia Type: General Level of consciousness: sedated Pain management: pain level controlled Vital Signs Assessment: post-procedure vital signs reviewed and stable Respiratory status: spontaneous breathing and respiratory function stable Cardiovascular status: stable Postop Assessment: no apparent nausea or vomiting Anesthetic complications: no   No notable events documented.  Last Vitals:  Vitals:   09/18/22 1150 09/18/22 1200  BP:  (!) 119/55  Pulse: 65 65  Resp: 18 16  Temp:    SpO2: 93% 93%    Last Pain:  Vitals:   09/18/22 1145  TempSrc:   PainSc: 0-No pain                 Sabina Beavers DANIEL

## 2022-09-18 NOTE — Transfer of Care (Signed)
Immediate Anesthesia Transfer of Care Note  Patient: MICHIAH MASSE  Procedure(s) Performed: POSTERIOR REPAIR WITH PERINEORRHAPHY AND SACROSPINOUS FIXATION (Vagina ) TRANSVAGINAL TAPE (TVT) PROCEDURE (POSSIBLE) (Pelvis) CYSTOSCOPY (Bladder)  Patient Location: PACU  Anesthesia Type:General  Level of Consciousness: awake, alert , and patient cooperative  Airway & Oxygen Therapy: Patient Spontanous Breathing and Patient connected to nasal cannula oxygen  Post-op Assessment: Report given to RN and Post -op Vital signs reviewed and stable  Post vital signs: Reviewed and stable  Last Vitals:  Vitals Value Taken Time  BP 148/61 09/18/22 1117  Temp    Pulse 64 09/18/22 1119  Resp 15 09/18/22 1119  SpO2 99 % 09/18/22 1119  Vitals shown include unvalidated device data.  Last Pain:  Vitals:   09/18/22 0729  TempSrc: Oral  PainSc: 4       Patients Stated Pain Goal: 5 (26/33/35 4562)  Complications: No notable events documented.

## 2022-09-18 NOTE — Anesthesia Preprocedure Evaluation (Addendum)
Anesthesia Evaluation  Patient identified by MRN, date of birth, ID band Patient awake    Reviewed: Allergy & Precautions, NPO status , Patient's Chart, lab work & pertinent test results  History of Anesthesia Complications Negative for: history of anesthetic complications  Airway Mallampati: II  TM Distance: >3 FB Neck ROM: Full    Dental no notable dental hx. (+) Dental Advisory Given,    Pulmonary neg pulmonary ROS   Pulmonary exam normal        Cardiovascular hypertension, Pt. on medications Normal cardiovascular exam+ dysrhythmias Supra Ventricular Tachycardia      Neuro/Psych  PSYCHIATRIC DISORDERS Anxiety Depression     Neuromuscular disease    GI/Hepatic Neg liver ROS,GERD  Medicated,,  Endo/Other  diabetes    Renal/GU Renal InsufficiencyRenal disease     Musculoskeletal  (+) Arthritis ,    Abdominal   Peds  Hematology negative hematology ROS (+)   Anesthesia Other Findings Day of surgery medications reviewed with the patient.  Reproductive/Obstetrics                             Anesthesia Physical Anesthesia Plan  ASA: 3  Anesthesia Plan: General   Post-op Pain Management: Tylenol PO (pre-op)* and Toradol IV (intra-op)*   Induction: Intravenous  PONV Risk Score and Plan: 4 or greater and Ondansetron, Dexamethasone, Diphenhydramine and Treatment may vary due to age or medical condition  Airway Management Planned: Oral ETT and LMA  Additional Equipment:   Intra-op Plan:   Post-operative Plan: Extubation in OR  Informed Consent: I have reviewed the patients History and Physical, chart, labs and discussed the procedure including the risks, benefits and alternatives for the proposed anesthesia with the patient or authorized representative who has indicated his/her understanding and acceptance.     Dental advisory given  Plan Discussed with: Anesthesiologist and  CRNA  Anesthesia Plan Comments:         Anesthesia Quick Evaluation

## 2022-09-19 ENCOUNTER — Telehealth: Payer: Self-pay | Admitting: Obstetrics and Gynecology

## 2022-09-19 NOTE — Telephone Encounter (Signed)
Called patient and she reports she is not feeling well. Reports she did not get any rest last night as she is using her catheter.  She is scheduled Monday at 0930 for a voiding trial and catheter removal in office.  Has not yet had a bowel movement. We discussed using the Miralax and milk of magnesia to assist in her bowel movement over the weekend.  Reports she is having pain and soreness.  Discussed with patient and caregiver that the catheter should be below the level of the bladder to allow for drainage but to try and keep it off the floor.

## 2022-09-19 NOTE — Telephone Encounter (Signed)
[name] underwent Posterior repair, perineorrhaphy, enterocele repair, sacrospinous ligament hysteropexy, midurethral sling, cystoscopy on 09/18/22.   She failed her voiding trial.  She was unable to void  She was discharged with a catheter. Please call her for a routine post op check and to schedule a voiding trial for next week. Thanks!  Jaquita Folds, MD

## 2022-09-22 ENCOUNTER — Ambulatory Visit: Payer: Medicare Other

## 2022-09-22 ENCOUNTER — Other Ambulatory Visit: Payer: Self-pay | Admitting: Obstetrics and Gynecology

## 2022-09-22 ENCOUNTER — Encounter (HOSPITAL_BASED_OUTPATIENT_CLINIC_OR_DEPARTMENT_OTHER): Payer: Self-pay | Admitting: Obstetrics and Gynecology

## 2022-09-22 DIAGNOSIS — Z01818 Encounter for other preprocedural examination: Secondary | ICD-10-CM

## 2022-09-22 DIAGNOSIS — G8918 Other acute postprocedural pain: Secondary | ICD-10-CM

## 2022-09-22 MED ORDER — OXYCODONE HCL 5 MG PO TABS: 5.0000 mg | ORAL_TABLET | ORAL | 0 refills | Status: DC | PRN

## 2022-09-22 NOTE — Progress Notes (Signed)
Urogyn Nurse Voiding Trial Note  [name] underwent POSTERIOR REPAIR WITH PERINEORRHAPHY AND SACROSPINOUS FIXATION and TRANSVAGINAL TAPE (TVT) PROCEDURE (POSSIBLE) on 09/18/2022.  She presents today for a voiding trial .   Patient was identified with 2 indicators. 361m of NS was instilled into the bladder via the catheter. The catheter was removed and patient was instructed to void into the urinary hat. She voided 1076m The post void residual measured by bladder scan was 28035mShe failed the voiding trial and a catheter was replaced.   The patient received aftercare instructions and will follow up as scheduled.

## 2022-09-22 NOTE — Progress Notes (Signed)
Pt seen in office for voiding trial- did not pass, only voided 198m. Has been taking ibuprofen and tylenol but continues to have buttock pain making it difficult to sit. Has finished oxycodone but was taking it at night. Not sleeping well overall. She has been having diarrhea. She stopped miralax. Advised to take imodium and hydrate with electrolyte drink like gatorade. Will have her return to the office later this week for repeat voiding trial.

## 2022-09-26 ENCOUNTER — Ambulatory Visit (INDEPENDENT_AMBULATORY_CARE_PROVIDER_SITE_OTHER): Payer: Medicare Other

## 2022-09-26 DIAGNOSIS — Z48816 Encounter for surgical aftercare following surgery on the genitourinary system: Secondary | ICD-10-CM

## 2022-09-26 NOTE — Progress Notes (Signed)
Urogyn Nurse Voiding Trial Note  [name] underwent POSTERIOR REPAIR WITH PERINEORRHAPHY AND SACROSPINOUS FIXATION and TRANSVAGINAL TAPE (TVT) PROCEDURE (POSSIBLE) on 09/18/2022. She presents today for a voiding trial .   Patient was identified with 2 indicators. 335m of NS was instilled into the bladder via the catheter. The catheter was removed and patient was instructed to void into the urinary hat. She voided 3069m She passed the voiding trial and a catheter was not replaced.   The patient received aftercare instructions and will follow up as scheduled.

## 2022-09-26 NOTE — Patient Instructions (Signed)
Keep  schedule post- op appointment. If you have any question feel free to contact our office.

## 2022-09-29 ENCOUNTER — Encounter: Payer: Self-pay | Admitting: *Deleted

## 2022-10-28 ENCOUNTER — Encounter: Payer: Self-pay | Admitting: Obstetrics and Gynecology

## 2022-10-28 ENCOUNTER — Ambulatory Visit (INDEPENDENT_AMBULATORY_CARE_PROVIDER_SITE_OTHER): Payer: Medicare Other | Admitting: Obstetrics and Gynecology

## 2022-10-28 VITALS — BP 125/60 | HR 69

## 2022-10-28 DIAGNOSIS — Z9889 Other specified postprocedural states: Secondary | ICD-10-CM

## 2022-10-28 NOTE — Progress Notes (Signed)
[facility]  Date of Visit: 10/28/2022  History of Present Illness: [name] is a 82 y.o. female scheduled today for a post-operative visit.   Surgery: s/p Posterior repair, perineorrhaphy, enterocele repair, sacrospinous ligament hysteropexy, midurethral sling, cystoscopy on 09/26/22  She did not pass her postoperative void trial. Passed repeat voiding trial on 09/26/22.   Postoperative course has been uncomplicated.   Today she reports she does have some pain on the right side occasionally but take tylenol and it improves.   UTI in the last 6 weeks? No  Pain? Yes - right side buttock  She has returned to her normal activity (except for postop restrictions) Vaginal bulge? No  Stress incontinence: No  Urgency/frequency: Yes , wakes 2-3 hours at night. Drinking 1 cup of coffee in AM, otherwise water.  Urge incontinence: Yes  Voiding dysfunction: No  Bowel issues: No   Subjective Success: Do you usually have a bulge or something falling out that you can see or feel in the vaginal area? No  Retreatment Success: Any retreatment with surgery or pessary for any compartment? No    Medications: She has a current medication list which includes the following prescription(s): alprazolam, calcium citrate-vitamin d, diltiazem, dorzolamide-timolol, escitalopram, hydrochlorothiazide, melatonin, oxycodone, rosuvastatin, and valsartan.   Allergies: Patient is allergic to codeine.   Physical Exam: BP 125/60   Pulse 69   LMP 08/12/1991    Suprapubic Incisions: healing well.  Pelvic Examination: Vagina: Incisions healing well. Sutures are present at incision line and there is not granulation tissue. No tenderness along the anterior or posterior vagina. No apical tenderness. No pelvic masses. No visible or palpable mesh.  POP-Q: POP-Q  -1.5                                            Aa   -1.5                                           Ba  -6.5                                               C   2                                            Gh  4                                            Pb  7                                            tvl   -3  Ap  -3                                            Bp  6.5                                              D     ---------------------------------------------------------  Assessment and Plan:  1. Post-operative state     - Healing well overall. We discussed that sutures are still present but will dissolve over time.  - For OAB symptoms, we discussed starting a medication. She is not interested in treatment at this time. We discussed that it may improve with healing but if it does not, then she can return and we can discuss further options for treatment.  - Can resume regular activity including exercise and intercourse,  if desired.  - Discussed avoidance of heavy lifting and straining long term to reduce the risk of recurrence.   Return as needed  Jaquita Folds, MD

## 2022-10-29 LAB — HM MAMMOGRAPHY

## 2022-11-24 ENCOUNTER — Other Ambulatory Visit: Payer: Self-pay | Admitting: Obstetrics and Gynecology

## 2022-11-24 ENCOUNTER — Other Ambulatory Visit: Payer: Self-pay | Admitting: Family Medicine

## 2022-11-24 DIAGNOSIS — Z01818 Encounter for other preprocedural examination: Secondary | ICD-10-CM

## 2022-11-24 NOTE — Telephone Encounter (Signed)
She needs a sooner visit-overdue

## 2022-11-25 NOTE — Telephone Encounter (Signed)
Please schedule pt for sooner OV for refills per Dr. Durene Cal.

## 2022-11-28 NOTE — Telephone Encounter (Signed)
Dr Durene Cal does not have any  avl apts until 5/1 .

## 2022-12-02 NOTE — Telephone Encounter (Signed)
Her scheduled appt is the soonest that you have per front desk.

## 2022-12-10 ENCOUNTER — Encounter: Payer: Self-pay | Admitting: Family Medicine

## 2022-12-10 ENCOUNTER — Ambulatory Visit (INDEPENDENT_AMBULATORY_CARE_PROVIDER_SITE_OTHER): Payer: Medicare Other | Admitting: Family Medicine

## 2022-12-10 VITALS — BP 122/64 | HR 61 | Temp 97.6°F | Ht 61.0 in | Wt 116.4 lb

## 2022-12-10 DIAGNOSIS — E1169 Type 2 diabetes mellitus with other specified complication: Secondary | ICD-10-CM | POA: Diagnosis not present

## 2022-12-10 DIAGNOSIS — E785 Hyperlipidemia, unspecified: Secondary | ICD-10-CM

## 2022-12-10 DIAGNOSIS — Z780 Asymptomatic menopausal state: Secondary | ICD-10-CM | POA: Diagnosis not present

## 2022-12-10 DIAGNOSIS — I700 Atherosclerosis of aorta: Secondary | ICD-10-CM | POA: Diagnosis not present

## 2022-12-10 DIAGNOSIS — F325 Major depressive disorder, single episode, in full remission: Secondary | ICD-10-CM | POA: Diagnosis not present

## 2022-12-10 DIAGNOSIS — E119 Type 2 diabetes mellitus without complications: Secondary | ICD-10-CM

## 2022-12-10 LAB — CBC WITH DIFFERENTIAL/PLATELET
Basophils Absolute: 0.1 10*3/uL (ref 0.0–0.1)
Basophils Relative: 0.7 % (ref 0.0–3.0)
Eosinophils Absolute: 0.2 10*3/uL (ref 0.0–0.7)
Eosinophils Relative: 2.2 % (ref 0.0–5.0)
HCT: 35.9 % — ABNORMAL LOW (ref 36.0–46.0)
Hemoglobin: 11.8 g/dL — ABNORMAL LOW (ref 12.0–15.0)
Lymphocytes Relative: 28.4 % (ref 12.0–46.0)
Lymphs Abs: 2.2 10*3/uL (ref 0.7–4.0)
MCHC: 32.8 g/dL (ref 30.0–36.0)
MCV: 93.2 fl (ref 78.0–100.0)
Monocytes Absolute: 0.6 10*3/uL (ref 0.1–1.0)
Monocytes Relative: 8 % (ref 3.0–12.0)
Neutro Abs: 4.7 10*3/uL (ref 1.4–7.7)
Neutrophils Relative %: 60.7 % (ref 43.0–77.0)
Platelets: 288 10*3/uL (ref 150.0–400.0)
RBC: 3.85 Mil/uL — ABNORMAL LOW (ref 3.87–5.11)
RDW: 13.3 % (ref 11.5–15.5)
WBC: 7.8 10*3/uL (ref 4.0–10.5)

## 2022-12-10 LAB — COMPREHENSIVE METABOLIC PANEL
ALT: 10 U/L (ref 0–35)
AST: 11 U/L (ref 0–37)
Albumin: 4 g/dL (ref 3.5–5.2)
Alkaline Phosphatase: 48 U/L (ref 39–117)
BUN: 20 mg/dL (ref 6–23)
CO2: 28 mEq/L (ref 19–32)
Calcium: 9.3 mg/dL (ref 8.4–10.5)
Chloride: 102 mEq/L (ref 96–112)
Creatinine, Ser: 0.87 mg/dL (ref 0.40–1.20)
GFR: 61.39 mL/min (ref >60.00)
Glucose, Bld: 135 mg/dL — ABNORMAL HIGH (ref 70–99)
Potassium: 3.7 mEq/L (ref 3.5–5.1)
Sodium: 141 mEq/L (ref 135–145)
Total Bilirubin: 0.5 mg/dL (ref 0.2–1.2)
Total Protein: 6.3 g/dL (ref 6.0–8.3)

## 2022-12-10 LAB — LIPID PANEL
Cholesterol: 169 mg/dL (ref 0–200)
HDL: 36.9 mg/dL — ABNORMAL LOW (ref >39.00)
LDL Cholesterol: 93 mg/dL (ref 0–99)
NonHDL: 131.97
Total CHOL/HDL Ratio: 5
Triglycerides: 197 mg/dL — ABNORMAL HIGH (ref 0.0–149.0)
VLDL: 39.4 mg/dL (ref 0.0–40.0)

## 2022-12-10 LAB — MICROALBUMIN / CREATININE URINE RATIO
Creatinine,U: 104.4 mg/dL
Microalb Creat Ratio: 5.7 mg/g (ref 0.0–30.0)
Microalb, Ur: 5.9 mg/dL — ABNORMAL HIGH (ref 0.0–1.9)

## 2022-12-10 LAB — HEMOGLOBIN A1C: Hgb A1c MFr Bld: 6.7 % — ABNORMAL HIGH (ref 4.6–6.5)

## 2022-12-10 LAB — TSH: TSH: 0.79 u[IU]/mL (ref 0.35–5.50)

## 2022-12-10 NOTE — Progress Notes (Signed)
Phone [phone] In person visit   Subjective:   [name] is a 82 y.o. year old very pleasant female patient who presents for/with See problem oriented charting Chief Complaint  Patient presents with   medication check   Past Medical History-  Patient Active Problem List   Diagnosis Date Noted   Diabetes mellitus without complication (HCC) 03/23/2019    Priority: High   History of supraventricular tachycardia 08/14/2011    Priority: High   Osteopenia of left femoral neck 08/16/2021    Priority: Medium    Aortic atherosclerosis (HCC) 07/21/2019    Priority: Medium    GERD (gastroesophageal reflux disease) 07/14/2014    Priority: Medium    Primary open angle glaucoma of both eyes, mild stage 05/29/2014    Priority: Medium    IBS (irritable bowel syndrome)- diarrhea predominant 01/26/2013    Priority: Medium    Glaucoma associated with ocular disorder 11/05/2009    Priority: Medium    Hyperlipidemia associated with type 2 diabetes mellitus (HCC) 09/22/2007    Priority: Medium    Major depression in full remission (HCC)- Lexapro 20mg . Xanax once a week or less. 03/11/2007    Priority: Medium    Essential hypertension 03/11/2007    Priority: Medium    Sensorineural hearing loss (SNHL), bilateral 04/06/2020    Priority: Low   History of colonic polyps 11/28/2011    Priority: Low   Senile nuclear sclerosis 06/29/2011    Priority: Low   Cervical disc disorder with radiculopathy of cervical region 11/05/2009    Priority: Low   Rosacea 03/11/2007    Priority: Low   SVT (supraventricular tachycardia) 08/16/2021   Hypokalemia 07/21/2021   C. difficile diarrhea 07/20/2021   Vertigo 04/06/2020   Knee pain, left 11/02/2013    Medications- reviewed and updated Current Outpatient Medications  Medication Sig Dispense Refill   diltiazem (CARDIZEM CD) 240 MG 24 hr capsule TAKE 1 CAPSULE BY MOUTH EVERY DAY (Patient taking differently: Take 240 mg by mouth daily. TAKE 1  CAPSULE BY MOUTH EVERY DAY) 90 capsule 3   dorzolamide-timolol (COSOPT) 22.3-6.8 MG/ML ophthalmic solution Place 1 drop into both eyes 2 (two) times daily.     escitalopram (LEXAPRO) 20 MG tablet TAKE 1 TABLET BY MOUTH EVERY DAY (Patient taking differently: Take 20 mg by mouth daily.) 90 tablet 3   hydrochlorothiazide (HYDRODIURIL) 25 MG tablet TAKE 1 TABLET BY MOUTH EVERY DAY (Patient taking differently: Take 25 mg by mouth daily.) 90 tablet 3   melatonin 5 MG TABS Take 5 mg by mouth at bedtime as needed (Sleep).     rosuvastatin (CRESTOR) 10 MG tablet TAKE 1 TABLET (10 MG TOTAL) BY MOUTH ONCE A WEEK. (Patient taking differently: Take 10 mg by mouth once a week.) 12 tablet 4   valsartan (DIOVAN) 320 MG tablet TAKE 1 TABLET BY MOUTH EVERY DAY (Patient taking differently: Take 320 mg by mouth daily.) 90 tablet 3   ALPRAZolam (XANAX) 0.25 MG tablet TAKE 1 TABLET (0.25 MG TOTAL) BY MOUTH DAILY AS NEEDED. FOR ANXIETY (Patient not taking: Reported on 12/10/2022) 30 tablet 0   Calcium Carbonate-Vitamin D (CALCIUM + D PO) Take 600 mg by mouth daily. (Patient not taking: Reported on 12/10/2022)     No current facility-administered medications for this visit.     Objective:  BP 122/64   Pulse 61   Temp 97.6 F (36.4 C)   Ht 5\' 1"  (1.549 m)   Wt 116 lb 6.4 oz (52.8 kg)  LMP 08/12/1991   SpO2 97%   BMI 21.99 kg/m  Gen: NAD, resting comfortably CV: RRR no murmurs rubs or gallops Lungs: CTAB no crackles, wheeze, rhonchi Ext: no edema Skin: warm, dry  Diabetic Foot Exam - Simple   Simple Foot Form Diabetic Foot exam was performed with the following findings: Yes 12/10/2022 12:19 PM  Visual Inspection No deformities, no ulcerations, no other skin breakdown bilaterally: Yes Sensation Testing Intact to touch and monofilament testing bilaterally: Yes Pulse Check Posterior Tibialis and Dorsalis pulse intact bilaterally: Yes Comments        Assessment and Plan   #urogynecology surgery- no  longer feeling prolapse but still has urinary frequency, more OAB - she is tolerating but doesn't feel like she had this prior to surgery so she is slightly discouraged  # Diabetes S: Diet controlled.  New diagnosis in 2020 after 2 out of 3 last A1c is over 6.5. Lab Results  Component Value Date   HGBA1C 6.1 (H) 07/21/2021   HGBA1C 6.6 (H) 02/06/2021   HGBA1C 6.5 (H) 07/25/2020   A/P: hopefully stable- update a1c  today. Continue without meds for now  -does need updated diabetic eye exam -exercise has fallen back so we discussed #s may have increased hopefully not to where she needs medicine  #hyperlipidemia associated with diabetes/aortic atherosclerosis- incidental finding on prior imaging  S: rosuvastatin 10 mg weekly Lab Results  Component Value Date   CHOL 184 04/18/2021   HDL 33.60 (L) 04/18/2021   LDLCALC 132 (H) 07/25/2020   LDLDIRECT 95.0 04/18/2021   TRIG 326.0 (H) 04/18/2021   CHOLHDL 5 04/18/2021  A/P: hopefully improved- update lipid panel today. Continue current meds for now. If triglyceride(s) higher than 400 will need to be fasting in future   #hypertension/history of SVT S: compliant with Cardizem 240mg  ER and Diovan 320 mg and hydrochlorothiazide 25 mg No obvious recurrence of SVT on Cardizem. A/P: hypertension stable- continue current medicines  No recurrence of svt- wants to hold off on cardiology referral   #Depression/anxiety- lost son to MI 2019 and daughter to pancreatic cancer 2020 and another son to MI 2021 then granddaughter lost husband in [facility] 2021 S: Patient compliant with Lexapro 20 mg.  Patient takes sparing alprazolam    12/10/2022   11:36 AM 06/03/2022    8:05 AM 04/03/2022   11:45 AM  Depression screen PHQ 2/9  Decreased Interest 1 0 0  Down, Depressed, Hopeless 1 1 0  PHQ - 2 Score 2 1 0  Altered sleeping 2    Tired, decreased energy 0    Change in appetite 0    Feeling bad or failure about yourself  0    Trouble concentrating 0     Moving slowly or fidgety/restless 0    Suicidal thoughts 0    PHQ-9 Score 4    Difficult doing work/chores Not difficult at all    A/P: reasonable control- continue current medications - imperfect control but a lot of significant prior losses which still weigh on her   #GERD- with small hiatal hernia on CT S: off medications due to recurrent C diff. Tolerates maybe 3x a week reflux- advised could trial over the counter tums A/P: Tums only trial- reduces risk of C diff recurrence   Recommended follow up: Return in about 6 months (around 06/12/2023) for followup or sooner if needed.Schedule b4 you leave. Future Appointments  Date Time Provider Department Center  03/06/2023  9:20 AM Shelva Majestic, MD [facility]  [facility]  06/08/2023  8:30 AM [facility] ANNUAL WELLNESS VISIT 1 [facility]    Lab/Order associations: NOT fasting- oatmeal and orange   ICD-10-CM   1. Diabetes mellitus without complication (HCC)  E11.9 Hemoglobin A1c    Urine Microalbumin w/creat. ratio    CBC with Differential/Platelet    Comprehensive metabolic panel    Lipid panel    2. Hyperlipidemia associated with type 2 diabetes mellitus (HCC)  E11.69 TSH   E78.5     3. Aortic atherosclerosis (HCC)  I70.0     4. Major depressive disorder with single episode, in full remission (HCC) Chronic F32.5     5. Postmenopausal  Z78.0 DG Bone Density      No orders of the defined types were placed in this encounter.   Return precautions advised.  Tana Conch, MD

## 2022-12-10 NOTE — Patient Instructions (Addendum)
Get diabetic eye exam scheduled.  Tolerates maybe 3x a week reflux- advised could trial over the counter Tums  Schedule your bone density test at check out desk.  - located [address] across the street from [place] - in the basement - you DO NEED an appointment for the bone density tests.   Please stop by lab before you go If you have mychart- we will send your results within 3 business days of [place] receiving them.  If you do not have mychart- we will call you about results within 5 business days of [place] receiving them.  *please also note that you will see labs on mychart as soon as they post. I will later go in and write notes on them- will say "notes from Dr. Durene Cal"   On your way out you can push your July visit until around 6 months from today

## 2022-12-15 ENCOUNTER — Ambulatory Visit (INDEPENDENT_AMBULATORY_CARE_PROVIDER_SITE_OTHER)
Admission: RE | Admit: 2022-12-15 | Discharge: 2022-12-15 | Disposition: A | Discharge status: Home / Self Care | Payer: Medicare Other | Source: Ambulatory Visit | Attending: Family Medicine | Admitting: Family Medicine

## 2022-12-15 ENCOUNTER — Other Ambulatory Visit: Payer: Medicare Other

## 2022-12-15 DIAGNOSIS — Z780 Asymptomatic menopausal state: Secondary | ICD-10-CM

## 2022-12-17 ENCOUNTER — Other Ambulatory Visit: Payer: Self-pay | Admitting: Obstetrics and Gynecology

## 2022-12-17 DIAGNOSIS — Z01818 Encounter for other preprocedural examination: Secondary | ICD-10-CM

## 2022-12-24 NOTE — Telephone Encounter (Signed)
Was a pre-op medication, no longer needed.

## 2023-03-06 ENCOUNTER — Ambulatory Visit: Payer: Medicare Other | Admitting: Family Medicine

## 2023-03-15 ENCOUNTER — Other Ambulatory Visit: Payer: Self-pay | Admitting: Family Medicine

## 2023-03-31 ENCOUNTER — Other Ambulatory Visit: Payer: Self-pay | Admitting: Obstetrics and Gynecology

## 2023-03-31 ENCOUNTER — Other Ambulatory Visit: Payer: Self-pay | Admitting: Family Medicine

## 2023-03-31 DIAGNOSIS — Z01818 Encounter for other preprocedural examination: Secondary | ICD-10-CM

## 2023-04-03 ENCOUNTER — Other Ambulatory Visit: Payer: Self-pay | Admitting: Family Medicine

## 2023-05-28 ENCOUNTER — Other Ambulatory Visit: Payer: Self-pay | Admitting: Family Medicine

## 2023-05-28 ENCOUNTER — Other Ambulatory Visit: Payer: Self-pay | Admitting: Obstetrics and Gynecology

## 2023-05-28 DIAGNOSIS — Z01818 Encounter for other preprocedural examination: Secondary | ICD-10-CM

## 2023-06-15 ENCOUNTER — Ambulatory Visit: Payer: Medicare Other | Admitting: Family Medicine

## 2023-06-15 ENCOUNTER — Encounter: Payer: Self-pay | Admitting: Family Medicine

## 2023-06-15 VITALS — BP 120/72 | HR 73 | Temp 98.0°F | Ht 61.0 in | Wt 110.2 lb

## 2023-06-15 DIAGNOSIS — E1169 Type 2 diabetes mellitus with other specified complication: Secondary | ICD-10-CM | POA: Diagnosis not present

## 2023-06-15 DIAGNOSIS — E119 Type 2 diabetes mellitus without complications: Secondary | ICD-10-CM

## 2023-06-15 DIAGNOSIS — F325 Major depressive disorder, single episode, in full remission: Secondary | ICD-10-CM

## 2023-06-15 DIAGNOSIS — I10 Essential (primary) hypertension: Secondary | ICD-10-CM

## 2023-06-15 DIAGNOSIS — Z23 Encounter for immunization: Secondary | ICD-10-CM | POA: Diagnosis not present

## 2023-06-15 DIAGNOSIS — E785 Hyperlipidemia, unspecified: Secondary | ICD-10-CM

## 2023-06-15 LAB — COMPREHENSIVE METABOLIC PANEL
ALT: 15 U/L (ref 0–35)
AST: 15 U/L (ref 0–37)
Albumin: 4.2 g/dL (ref 3.5–5.2)
Alkaline Phosphatase: 65 U/L (ref 39–117)
BUN: 22 mg/dL (ref 6–23)
CO2: 30 meq/L (ref 19–32)
Calcium: 9.7 mg/dL (ref 8.4–10.5)
Chloride: 102 meq/L (ref 96–112)
Creatinine, Ser: 0.85 mg/dL (ref 0.40–1.20)
GFR: 62.9 mL/min (ref >60.00)
Glucose, Bld: 100 mg/dL — ABNORMAL HIGH (ref 70–99)
Potassium: 3.7 meq/L (ref 3.5–5.1)
Sodium: 142 meq/L (ref 135–145)
Total Bilirubin: 0.5 mg/dL (ref 0.2–1.2)
Total Protein: 7.2 g/dL (ref 6.0–8.3)

## 2023-06-15 LAB — CBC WITH DIFFERENTIAL/PLATELET
Basophils Absolute: 0 10*3/uL (ref 0.0–0.1)
Basophils Relative: 0.3 % (ref 0.0–3.0)
Eosinophils Absolute: 0.1 10*3/uL (ref 0.0–0.7)
Eosinophils Relative: 0.8 % (ref 0.0–5.0)
HCT: 38.6 % (ref 36.0–46.0)
Hemoglobin: 12.5 g/dL (ref 12.0–15.0)
Lymphocytes Relative: 23.6 % (ref 12.0–46.0)
Lymphs Abs: 2.7 10*3/uL (ref 0.7–4.0)
MCHC: 32.3 g/dL (ref 30.0–36.0)
MCV: 95.7 fL (ref 78.0–100.0)
Monocytes Absolute: 0.7 10*3/uL (ref 0.1–1.0)
Monocytes Relative: 6.2 % (ref 3.0–12.0)
Neutro Abs: 8 10*3/uL — ABNORMAL HIGH (ref 1.4–7.7)
Neutrophils Relative %: 69.1 % (ref 43.0–77.0)
Platelets: 357 10*3/uL (ref 150.0–400.0)
RBC: 4.04 Mil/uL (ref 3.87–5.11)
RDW: 13.2 % (ref 11.5–15.5)
WBC: 11.6 10*3/uL — ABNORMAL HIGH (ref 4.0–10.5)

## 2023-06-15 LAB — HEMOGLOBIN A1C: Hgb A1c MFr Bld: 6.6 % — ABNORMAL HIGH (ref 4.6–6.5)

## 2023-06-15 NOTE — Progress Notes (Signed)
Phone [phone] In person visit   Subjective:   [name] is a 82 y.o. year old very pleasant female patient who presents for/with See problem oriented charting Chief Complaint  Patient presents with   Annual Exam    Not fasting.   Diabetes    Sign ROI at check out    Hypertension   Past Medical History-  Patient Active Problem List   Diagnosis Date Noted   Diabetes mellitus without complication (HCC) 03/23/2019    Priority: High   History of supraventricular tachycardia 08/14/2011    Priority: High   Osteopenia of left femoral neck 08/16/2021    Priority: Medium    Aortic atherosclerosis (HCC) 07/21/2019    Priority: Medium    GERD (gastroesophageal reflux disease) 07/14/2014    Priority: Medium    Primary open angle glaucoma of both eyes, mild stage 05/29/2014    Priority: Medium    IBS (irritable bowel syndrome)- diarrhea predominant 01/26/2013    Priority: Medium    Glaucoma associated with ocular disorder 11/05/2009    Priority: Medium    Hyperlipidemia associated with type 2 diabetes mellitus (HCC) 09/22/2007    Priority: Medium    Major depression in full remission (HCC)- Lexapro 20mg . Xanax once a week or less. 03/11/2007    Priority: Medium    Essential hypertension 03/11/2007    Priority: Medium    Sensorineural hearing loss (SNHL), bilateral 04/06/2020    Priority: Low   History of colonic polyps 11/28/2011    Priority: Low   Senile nuclear sclerosis 06/29/2011    Priority: Low   Cervical disc disorder with radiculopathy of cervical region 11/05/2009    Priority: Low   Rosacea 03/11/2007    Priority: Low   SVT (supraventricular tachycardia) (HCC) 08/16/2021   Hypokalemia 07/21/2021   C. difficile diarrhea 07/20/2021   Vertigo 04/06/2020   Knee pain, left 11/02/2013    Medications- reviewed and updated Current Outpatient Medications  Medication Sig Dispense Refill   ALPRAZolam (XANAX) 0.25 MG tablet TAKE 1 TABLET (0.25 MG TOTAL) BY MOUTH  DAILY AS NEEDED. FOR ANXIETY 30 tablet 0   Calcium Carbonate-Vitamin D (CALCIUM + D PO) Take 600 mg by mouth daily.     diltiazem (CARDIZEM CD) 240 MG 24 hr capsule TAKE 1 CAPSULE BY MOUTH EVERY DAY 90 capsule 3   dorzolamide-timolol (COSOPT) 22.3-6.8 MG/ML ophthalmic solution Place 1 drop into both eyes 2 (two) times daily.     escitalopram (LEXAPRO) 20 MG tablet TAKE 1 TABLET BY MOUTH EVERY DAY 90 tablet 3   hydrochlorothiazide (HYDRODIURIL) 25 MG tablet TAKE 1 TABLET BY MOUTH EVERY DAY 90 tablet 3   melatonin 5 MG TABS Take 5 mg by mouth at bedtime as needed (Sleep).     rosuvastatin (CRESTOR) 10 MG tablet TAKE 1 TABLET (10 MG TOTAL) BY MOUTH ONCE A WEEK. (Patient taking differently: Take 10 mg by mouth once a week.) 12 tablet 4   valsartan (DIOVAN) 320 MG tablet TAKE 1 TABLET BY MOUTH EVERY DAY 90 tablet 3   No current facility-administered medications for this visit.     Objective:  BP 120/72   Pulse 73   Temp 98 F (36.7 C)   Ht 5\' 1"  (1.549 m)   Wt 110 lb 3.2 oz (50 kg)   LMP 08/12/1991   SpO2 99%   BMI 20.82 kg/m  Gen: NAD, resting comfortably CV: RRR no murmurs rubs or gallops Lungs: CTAB no crackles, wheeze, rhonchi Abdomen: soft/nontender/nondistended/normal bowel  sounds. No rebound or guarding.  Ext: no edema Skin: warm, dry Neuro: grossly normal, moves all extremities    Assessment and Plan   #social update- son moved in with her since last visit  # Diabetes- diagnosed in 2020 S: Diet controlled. Exercise has fallen off as son moved into home- encouraged to restart -does report some increased thirst- update a1c Lab Results  Component Value Date   HGBA1C 6.7 (H) 12/10/2022   HGBA1C 6.1 (H) 07/21/2021   HGBA1C 6.6 (H) 02/06/2021  A/P: hopefully stable- update a1c today. Continue without meds for now   #hyperlipidemia associated with diabetes/aortic atherosclerosis- incidental finding on prior imaging  S:  rosuvastatin 10 mg weekly Lab Results  Component  Value Date   CHOL 169 12/10/2022   HDL 36.90 (L) 12/10/2022   LDLCALC 93 12/10/2022   LDLDIRECT 95.0 04/18/2021   TRIG 197.0 (H) 12/10/2022   CHOLHDL 5 12/10/2022  A/P: reasonable control for age- continue current medications . Will tolerate LDL at least under 100  #hypertension/history of SVT- sees cardiology #history of hypokalemia with C. diff S: medication(s): Cardizem 240mg  ER, valsartan 320 mg, hydrochlorothiazide 25 mg  No obvious recurrence of SVT on Cardizem. BP Readings from Last 3 Encounters:  06/15/23 120/72  12/10/22 122/64  10/28/22 125/60  A/P: doing well- continue current medications for hypertension  SVT no recurrenc-has cardiology follow up . She's considering stopping cardiology follow up   #Depression/anxiety- lost son to MI 2019 and daughter to pancreatic cancer 2020 and another son to MI 2021 then granddaughter lost husband in [facility] 2021 S: Patient compliant with Lexapro 20 mg.  Patient takes sparing alprazolam    06/15/2023   10:57 AM 12/10/2022   11:36 AM 06/03/2022    8:05 AM  Depression screen PHQ 2/9  Decreased Interest 0 1 0  Down, Depressed, Hopeless 0 1 1  PHQ - 2 Score 0 2 1  Altered sleeping 0 2   Tired, decreased energy 0 0   Change in appetite 0 0   Feeling bad or failure about yourself  0 0   Trouble concentrating 0 0   Moving slowly or fidgety/restless 0 0   Suicidal thoughts 0 0   PHQ-9 Score 0 4   Difficult doing work/chores Not difficult at all Not difficult at all   A/P: well controlled continue current medications    # Prolapse-required surgical correction 2024  but with some urinary urgency after surgery which was discouraging for her backup but otherwise did well  Immunizations-declines COVID for now - flu shot today   Recommended follow up: No follow-ups on file. Future Appointments  Date Time Provider Department Center  09/08/2023  9:30 AM Regan Lemming, MD [facility]    Lab/Order associations: NOT  fasting   ICD-10-CM   1. Diabetes mellitus without complication (HCC)  E11.9     2. Hyperlipidemia associated with type 2 diabetes mellitus (HCC)  E11.69    E78.5     3. Major depressive disorder with single episode, in full remission (HCC)  F32.5     4. Essential hypertension  I10       No orders of the defined types were placed in this encounter.   Return precautions advised.  Tana Conch, MD

## 2023-06-15 NOTE — Patient Instructions (Addendum)
Sign release of information at the check out desk for Diabetic eye exam  Flu shot today   Please stop by lab before you go If you have mychart- we will send your results within 3 business days of [place] receiving them.  If you do not have mychart- we will call you about results within 5 business days of [place] receiving them.  *please also note that you will see labs on mychart as soon as they post. I will later go in and write notes on them- will say "notes from Dr. Durene Cal"   You are eligible to schedule your annual wellness visit with our nurse specialist Inetta Fermo.  Please consider scheduling this before you leave today  Recommended follow up: Return in about 6 months (around 12/13/2023) for followup or sooner if needed.Schedule b4 you leave.

## 2023-06-15 NOTE — Addendum Note (Signed)
Addended by: Gwenette Greet on: 06/15/2023 11:35 AM   Modules accepted: Orders

## 2023-07-02 ENCOUNTER — Encounter: Payer: Self-pay | Admitting: Family

## 2023-07-02 ENCOUNTER — Ambulatory Visit: Payer: Medicare Other | Admitting: Family

## 2023-07-02 VITALS — BP 112/67 | HR 73 | Temp 98.0°F | Ht 61.0 in | Wt 110.6 lb

## 2023-07-02 DIAGNOSIS — R519 Headache, unspecified: Secondary | ICD-10-CM

## 2023-07-02 LAB — C-REACTIVE PROTEIN: CRP: <1 mg/dL (ref 0.5–20.0)

## 2023-07-02 LAB — SEDIMENTATION RATE: Sed Rate: 28 mm/h (ref 0–30)

## 2023-07-02 LAB — CBC WITH DIFFERENTIAL/PLATELET
Basophils Absolute: 0.1 10*3/uL (ref 0.0–0.1)
Basophils Relative: 0.6 % (ref 0.0–3.0)
Eosinophils Absolute: 0.1 10*3/uL (ref 0.0–0.7)
Eosinophils Relative: 0.9 % (ref 0.0–5.0)
HCT: 37.2 % (ref 36.0–46.0)
Hemoglobin: 12.3 g/dL (ref 12.0–15.0)
Lymphocytes Relative: 33 % (ref 12.0–46.0)
Lymphs Abs: 3.1 10*3/uL (ref 0.7–4.0)
MCHC: 33.2 g/dL (ref 30.0–36.0)
MCV: 95.2 fL (ref 78.0–100.0)
Monocytes Absolute: 0.6 10*3/uL (ref 0.1–1.0)
Monocytes Relative: 6.2 % (ref 3.0–12.0)
Neutro Abs: 5.5 10*3/uL (ref 1.4–7.7)
Neutrophils Relative %: 59.3 % (ref 43.0–77.0)
Platelets: 286 10*3/uL (ref 150.0–400.0)
RBC: 3.9 Mil/uL (ref 3.87–5.11)
RDW: 13.3 % (ref 11.5–15.5)
WBC: 9.3 10*3/uL (ref 4.0–10.5)

## 2023-07-02 NOTE — Progress Notes (Signed)
Patient ID: [name], female    DOB: 11-17-1940, 82 y.o.   MRN: [id number]  Chief Complaint  Patient presents with   Facial Pain    Pt c/o right sided pain, Present for 2-3 months. Pt was seen by eye doctor on 11/15 and would like labs.    Discussed the use of AI scribe software for clinical note transcription with the patient, who gave verbal consent to proceed.  History of Present Illness   The patient, with a history of glaucoma, presents with persistent right temple pain that has been present for a couple of months. The pain is described as a dull ache that is not associated with any tingling or numbness. Pt reports not noticing the pain as much when she is busy working during the day. The pain is not exacerbated by physical activity and does not present as a throbbing sensation. The patient denies any pain upon palpation of the area. The patient has also noticed a little watering in the right eye when watching television at night, but denies any changes in vision or pain within or behind the eye. The patient recently saw an ophthalmologist who found no issues with the retina and suggested blood work to rule out other potential causes. The patient also has a history of controlled diabetes and is currently on medication for blood pressure.    Assessment & Plan:     Right Temporal Pain - Chronic, dull, non-throbbing pain. No associated visual changes or pain behind the eye. Pain is not exacerbated by pressure. Denies pain with chewing or opening/closing jaw. Possible temporal arteritis. -Order ESR, CRP, and CBC to assess for inflammation. -Consider trial of prednisone if inflammatory markers are elevated, with caution due to potential impact on blood sugar and blood pressure.  Follow-up Await lab results, to be checked on 07/03/2023 or 07/06/2023.     Subjective:    Outpatient Medications Prior to Visit  Medication Sig Dispense Refill   ALPRAZolam (XANAX) 0.25 MG tablet TAKE 1  TABLET (0.25 MG TOTAL) BY MOUTH DAILY AS NEEDED. FOR ANXIETY 30 tablet 0   Calcium Carbonate-Vitamin D (CALCIUM + D PO) Take 600 mg by mouth daily.     diltiazem (CARDIZEM CD) 240 MG 24 hr capsule TAKE 1 CAPSULE BY MOUTH EVERY DAY 90 capsule 3   dorzolamide-timolol (COSOPT) 22.3-6.8 MG/ML ophthalmic solution Place 1 drop into both eyes 2 (two) times daily.     escitalopram (LEXAPRO) 20 MG tablet TAKE 1 TABLET BY MOUTH EVERY DAY 90 tablet 3   hydrochlorothiazide (HYDRODIURIL) 25 MG tablet TAKE 1 TABLET BY MOUTH EVERY DAY 90 tablet 3   melatonin 5 MG TABS Take 5 mg by mouth at bedtime as needed (Sleep).     rosuvastatin (CRESTOR) 10 MG tablet TAKE 1 TABLET (10 MG TOTAL) BY MOUTH ONCE A WEEK. (Patient taking differently: Take 10 mg by mouth once a week.) 12 tablet 4   valsartan (DIOVAN) 320 MG tablet TAKE 1 TABLET BY MOUTH EVERY DAY 90 tablet 3   No facility-administered medications prior to visit.   Past Medical History:  Diagnosis Date   Anxiety    Arthritis    Depression    Diverticulosis of colon 03/11/2007   Frequency of urination    GERD (gastroesophageal reflux disease) 10/14/2012   Glaucoma, both eyes    Hemorrhoids    Hiatal hernia    History of adenomatous polyp of colon    History of basal cell carcinoma (BCC) excision  History of Clostridioides difficile colitis    12/ 2022;  x2 in 2023 last one 07/ 2023   History of diverticulitis of colon    History of esophageal stricture 10/14/2012   s/p  dilatation 2014  and 01-02-2021 by dr Leone Payor   History of gout 07/2021   flare-up bilateral feet   History of kidney stones    Hyperlipidemia 09/22/2007   Hypertension    Irritable bowel syndrome with diarrhea    Osteopenia 02/09/2015   T score -1.4 FRAX 7.3%/0.6%   Posterior vaginal wall prolapse    PSVT (paroxysmal supraventricular tachycardia) (HCC) 08/2011   cardiologist--- dr Johney Frame;   first dx post op ACDF 08-14-2011   Rosacea 03/11/2007   SUI (stress urinary  incontinence, female)    Type 2 diabetes, diet controlled (HCC)    followed by pcp   Uterovaginal prolapse, incomplete    Wears hearing aid in both ears    Past Surgical History:  Procedure Laterality Date   ANTERIOR AND POSTERIOR REPAIR WITH SACROSPINOUS FIXATION N/A 09/18/2022   Procedure: POSTERIOR REPAIR WITH PERINEORRHAPHY AND SACROSPINOUS FIXATION;  Surgeon: Marguerita Beards, MD;  Location: [facility] [place];  Service: Gynecology;  Laterality: N/A;  Total time requested is 1.5 hrs   ANTERIOR CERVICAL DECOMP/DISCECTOMY FUSION  05/10/2008   @[facility]  by dr Channing Mutters;   C4-6   ANTERIOR CERVICAL DECOMP/DISCECTOMY FUSION  08/14/2011   Procedure: ANTERIOR CERVICAL DECOMPRESSION/DISCECTOMY FUSION 1 LEVEL/HARDWARE REMOVAL;  Surgeon: Dorian Heckle, MD;  Location: [facility];  Service: Neurosurgery;  Laterality: N/A;  Cervical Six-Seven anterior cervical decompression with fusion interbody prothesis plating and bonegraft with removal of hardware of Cervical Four to Cervical Six    BIOPSY  09/26/2021   Procedure: BIOPSY;  Surgeon: Rachael Fee, MD;  Location: [facility];  Service: Endoscopy;;   BLADDER SUSPENSION N/A 09/18/2022   Procedure: TRANSVAGINAL TAPE (TVT) PROCEDURE (POSSIBLE);  Surgeon: Marguerita Beards, MD;  Location: [facility];  Service: Gynecology;  Laterality: N/A;   BUNIONECTOMY     1980s   COLONOSCOPY N/A 09/26/2021   Procedure: COLONOSCOPY;  Surgeon: Rachael Fee, MD;  Location: [facility];  Service: Endoscopy;  Laterality: N/A;   CYSTOSCOPY N/A 09/18/2022   Procedure: CYSTOSCOPY;  Surgeon: Marguerita Beards, MD;  Location: [facility];  Service: Gynecology;  Laterality: N/A;   ESOPHAGOGASTRODUODENOSCOPY (EGD) WITH ESOPHAGEAL DILATION  01/02/2021   dr Leone Payor   KIDNEY SURGERY  1960   age 35-   Open kidney stone removal   KNEE ARTHROSCOPY W/ MENISCAL REPAIR Left 05/2014   TONSILLECTOMY     child   Allergies  Allergen  Reactions   Codeine Nausea Only      Objective:    Physical Exam Vitals and nursing note reviewed.  Constitutional:      Appearance: Normal appearance.  HENT:     Head: No contusion, masses or right periorbital erythema.     Jaw: No tenderness, swelling or pain on movement.  Cardiovascular:     Rate and Rhythm: Normal rate and regular rhythm.  Pulmonary:     Effort: Pulmonary effort is normal.     Breath sounds: Normal breath sounds.  Musculoskeletal:        General: Normal range of motion.  Skin:    General: Skin is warm and dry.  Neurological:     Mental Status: She is alert.  Psychiatric:        Mood and Affect: Mood normal.  Behavior: Behavior normal.    BP 112/67 (BP Location: Left Arm, Patient Position: Sitting, Cuff Size: Normal)   Pulse 73   Temp 98 F (36.7 C) (Temporal)   Ht 5\' 1"  (1.549 m)   Wt 110 lb 9.6 oz (50.2 kg)   LMP 08/12/1991   SpO2 96%   BMI 20.90 kg/m  Wt Readings from Last 3 Encounters:  07/02/23 110 lb 9.6 oz (50.2 kg)  06/15/23 110 lb 3.2 oz (50 kg)  12/10/22 116 lb 6.4 oz (52.8 kg)       Dulce Sellar, NP

## 2023-07-06 NOTE — Progress Notes (Signed)
Let Tanyell know her labs all came back negative. She should follow up with her ophthalmologist and Dr Durene Cal, thanks.

## 2023-08-22 ENCOUNTER — Other Ambulatory Visit: Payer: Self-pay | Admitting: Family Medicine

## 2023-09-08 ENCOUNTER — Encounter: Payer: Self-pay | Admitting: Cardiology

## 2023-09-08 ENCOUNTER — Ambulatory Visit: Payer: Medicare Other | Attending: Cardiology | Admitting: Cardiology

## 2023-09-08 VITALS — BP 136/80 | HR 58 | Ht 61.0 in | Wt 111.4 lb

## 2023-09-08 DIAGNOSIS — I10 Essential (primary) hypertension: Secondary | ICD-10-CM | POA: Diagnosis present

## 2023-09-08 DIAGNOSIS — I471 Supraventricular tachycardia, unspecified: Secondary | ICD-10-CM

## 2023-09-08 NOTE — Progress Notes (Signed)
  Electrophysiology Office Note:   Date:  09/08/2023  ID:  [name], DOB 26-Jan-1941, MRN [id number]  Primary Cardiologist: None Primary Heart Failure: None Electrophysiologist: Edem Tiegs Jorja Loa, MD      History of Present Illness:   [name] is a 83 y.o. female with h/o SVT, hypertension, hyperlipidemia seen today for routine electrophysiology followup.   Since last being seen in our clinic the patient reports doing well.  She has had minimal episodes of SVT.  Unfortunately, her son died 2 weeks ago.  He was in the hospital for what sounds like MRSA sepsis.  He had was an alcoholic as well.  She had palpitations around that time, but has done well since that.  she denies chest pain, palpitations, dyspnea, PND, orthopnea, nausea, vomiting, dizziness, syncope, edema, weight gain, or early satiety.   Review of systems complete and found to be negative unless listed in HPI.   EP Information / Studies Reviewed:    EKG is ordered today. Personal review as below.  EKG Interpretation Date/Time:  Tuesday September 08 2023 09:42:05 EST Ventricular Rate:  58 PR Interval:  200 QRS Duration:  104 QT Interval:  402 QTC Calculation: 394 R Axis:   -64  Text Interpretation: Sinus bradycardia Left anterior fascicular block Minimal voltage criteria for LVH, may be normal variant ( Cornell product ) Septal infarct , age undetermined When compared with ECG of 20-Jul-2021 21:22, Sinus rhythm Confirmed by [employer], Shela Esses ([id number]) on 09/08/2023 9:46:37 AM     Risk Assessment/Calculations:             Physical Exam:   VS:  BP 136/80 (BP Location: Left Arm, Patient Position: Sitting, Cuff Size: Normal)   Pulse (!) 58   Ht 5\' 1"  (1.549 m)   Wt 111 lb 6.4 oz (50.5 kg)   LMP 08/12/1991   SpO2 97%   BMI 21.05 kg/m    Wt Readings from Last 3 Encounters:  09/08/23 111 lb 6.4 oz (50.5 kg)  07/02/23 110 lb 9.6 oz (50.2 kg)  06/15/23 110 lb 3.2 oz (50 kg)     GEN: Well nourished, well  developed in no acute distress NECK: No JVD; No carotid bruits CARDIAC: Regular rate and rhythm, no murmurs, rubs, gallops RESPIRATORY:  Clear to auscultation without rales, wheezing or rhonchi  ABDOMEN: Soft, non-tender, non-distended EXTREMITIES:  No edema; No deformity   ASSESSMENT AND PLAN:    1.  SVT: Currently on diltiazem.  Overall well-controlled.  No changes.  2.  Hypertension: Currently well-controlled  Follow up with EP APP in 12 months  Signed, Max Nuno Jorja Loa, MD

## 2023-09-25 ENCOUNTER — Ambulatory Visit: Payer: Medicare Other

## 2023-09-25 ENCOUNTER — Ambulatory Visit: Payer: Medicare Other | Admitting: Family

## 2023-09-25 ENCOUNTER — Encounter: Payer: Self-pay | Admitting: Family

## 2023-09-25 VITALS — Temp 97.6°F | Ht 61.0 in | Wt 112.4 lb

## 2023-09-25 DIAGNOSIS — M25552 Pain in left hip: Secondary | ICD-10-CM

## 2023-09-25 NOTE — Progress Notes (Signed)
Patient ID: [name], female    DOB: [birth year], 83 y.o.   MRN: [id number]  Chief Complaint  Patient presents with   Leg Pain    Pt c/o left leg pain, present for 3 weeks. Has tried tylenol and advil which does help slightly. Pt denies injury.        Discussed the use of AI scribe software for clinical note transcription with the patient, who gave verbal consent to proceed.  History of Present Illness   [name] is an 83 year old female who presents with left hip pain. She has been experiencing left hip pain radiating laterally down her leg for a few weeks. The pain is described as a 'wake up pain' that occurs at night, disturbing her sleep. It is deep and not tender to touch. No previous episodes of similar pain have been noted. No burning sensation, tingling, pins and needles, electrical sensations, or numbness associated with the pain. She has been taking Tylenol and Advil with some relief and has tried melatonin to aid sleep. She has not used ice or heat for the pain. She mentions no new or strenuous activities, nor any falls that could have triggered the pain. She typically sleeps on her right side and occasionally on her left side if uncomfortable, but never on her back. Her past medical history includes bunion surgery on her foot, which required two procedures and resulted in the fusion of two toes. She reports no current pain from this condition, as long as she wears appropriate shoes.       Assessment & Plan:     Left Hip Pain - New onset deep pain, worse at night, not associated with trauma or strenuous activity. No relief with Tylenol or Advil. Possible sciatica vs bursitis. Advised to avoid sitting for long periods. -Order hip X-ray to rule out fracture or joint pathology. -Advise to apply Voltaren gel (pt has at home) up to four times a day. -Continue Tylenol and Advil as needed for pain. -Consider ice application for inflammation up to 20 minutes 3 times per  day. -Call next week if pain is not improving, will consider Ortho referral.      Subjective:    Outpatient Medications Prior to Visit  Medication Sig Dispense Refill   ALPRAZolam (XANAX) 0.25 MG tablet TAKE 1 TABLET (0.25 MG TOTAL) BY MOUTH DAILY AS NEEDED. FOR ANXIETY 30 tablet 0   Calcium Carbonate-Vitamin D (CALCIUM + D PO) Take 600 mg by mouth daily.     diltiazem (CARDIZEM CD) 240 MG 24 hr capsule TAKE 1 CAPSULE BY MOUTH EVERY DAY 90 capsule 3   dorzolamide-timolol (COSOPT) 22.3-6.8 MG/ML ophthalmic solution Place 1 drop into both eyes 2 (two) times daily.     escitalopram (LEXAPRO) 20 MG tablet TAKE 1 TABLET BY MOUTH EVERY DAY 90 tablet 3   hydrochlorothiazide (HYDRODIURIL) 25 MG tablet TAKE 1 TABLET BY MOUTH EVERY DAY 90 tablet 3   melatonin 5 MG TABS Take 5 mg by mouth at bedtime as needed (Sleep).     rosuvastatin (CRESTOR) 10 MG tablet TAKE 1 TABLET (10 MG TOTAL) BY MOUTH ONCE A WEEK. 12 tablet 4   valsartan (DIOVAN) 320 MG tablet TAKE 1 TABLET BY MOUTH EVERY DAY 90 tablet 3   No facility-administered medications prior to visit.   Past Medical History:  Diagnosis Date   Anxiety    Arthritis    Depression    Diverticulosis of colon 03/11/2007   Frequency of  urination    GERD (gastroesophageal reflux disease) 10/14/2012   Glaucoma, both eyes    Hemorrhoids    Hiatal hernia    History of adenomatous polyp of colon    History of basal cell carcinoma (BCC) excision    History of Clostridioides difficile colitis    12/ 2022;  x2 in 2023 last one 07/ 2023   History of diverticulitis of colon    History of esophageal stricture 10/14/2012   s/p  dilatation 2014  and 01-02-2021 by dr Leone Payor   History of gout 07/2021   flare-up bilateral feet   History of kidney stones    Hyperlipidemia 09/22/2007   Hypertension    Irritable bowel syndrome with diarrhea    Osteopenia 02/09/2015   T score -1.4 FRAX 7.3%/0.6%   Posterior vaginal wall prolapse    PSVT (paroxysmal  supraventricular tachycardia) (HCC) 08/2011   cardiologist--- dr Johney Frame;   first dx post op ACDF 08-14-2011   Rosacea 03/11/2007   SUI (stress urinary incontinence, female)    Type 2 diabetes, diet controlled (HCC)    followed by pcp   Uterovaginal prolapse, incomplete    Wears hearing aid in both ears    Past Surgical History:  Procedure Laterality Date   ANTERIOR AND POSTERIOR REPAIR WITH SACROSPINOUS FIXATION N/A 09/18/2022   Procedure: POSTERIOR REPAIR WITH PERINEORRHAPHY AND SACROSPINOUS FIXATION;  Surgeon: Marguerita Beards, MD;  Location: [facility];  Service: Gynecology;  Laterality: N/A;  Total time requested is 1.5 hrs   ANTERIOR CERVICAL DECOMP/DISCECTOMY FUSION  05/10/2008   @[facility]  by dr Channing Mutters;   C4-6   ANTERIOR CERVICAL DECOMP/DISCECTOMY FUSION  08/14/2011   Procedure: ANTERIOR CERVICAL DECOMPRESSION/DISCECTOMY FUSION 1 LEVEL/HARDWARE REMOVAL;  Surgeon: Dorian Heckle, MD;  Location: [facility];  Service: Neurosurgery;  Laterality: N/A;  Cervical Six-Seven anterior cervical decompression with fusion interbody prothesis plating and bonegraft with removal of hardware of Cervical Four to Cervical Six    BIOPSY  09/26/2021   Procedure: BIOPSY;  Surgeon: Rachael Fee, MD;  Location: [facility];  Service: Endoscopy;;   BLADDER SUSPENSION N/A 09/18/2022   Procedure: TRANSVAGINAL TAPE (TVT) PROCEDURE (POSSIBLE);  Surgeon: Marguerita Beards, MD;  Location: [facility];  Service: Gynecology;  Laterality: N/A;   BUNIONECTOMY     1980s   COLONOSCOPY N/A 09/26/2021   Procedure: COLONOSCOPY;  Surgeon: Rachael Fee, MD;  Location: [facility];  Service: Endoscopy;  Laterality: N/A;   CYSTOSCOPY N/A 09/18/2022   Procedure: CYSTOSCOPY;  Surgeon: Marguerita Beards, MD;  Location: [facility];  Service: Gynecology;  Laterality: N/A;   ESOPHAGOGASTRODUODENOSCOPY (EGD) WITH ESOPHAGEAL DILATION  01/02/2021   dr Leone Payor   KIDNEY SURGERY   1960   age 61-   Open kidney stone removal   KNEE ARTHROSCOPY W/ MENISCAL REPAIR Left 05/2014   TONSILLECTOMY     child   Allergies  Allergen Reactions   Codeine Nausea Only      Objective:    Physical Exam Vitals and nursing note reviewed.  Constitutional:      Appearance: Normal appearance.  Cardiovascular:     Rate and Rhythm: Normal rate and regular rhythm.  Pulmonary:     Effort: Pulmonary effort is normal.     Breath sounds: Normal breath sounds.  Musculoskeletal:        General: Normal range of motion.     Left hip: Tenderness present. No bony tenderness. Normal range of motion. Normal strength.  Skin:    General: Skin is warm and dry.  Neurological:     Mental Status: She is alert.  Psychiatric:        Mood and Affect: Mood normal.        Behavior: Behavior normal.    Temp 97.6 F (36.4 C) (Temporal)   Ht 5\' 1"  (1.549 m)   Wt 112 lb 6.4 oz (51 kg)   LMP 08/12/1991   BMI 21.24 kg/m  Wt Readings from Last 3 Encounters:  09/25/23 112 lb 6.4 oz (51 kg)  09/08/23 111 lb 6.4 oz (50.5 kg)  07/02/23 110 lb 9.6 oz (50.2 kg)       Dulce Sellar, NP

## 2023-10-02 ENCOUNTER — Telehealth: Payer: Self-pay | Admitting: *Deleted

## 2023-10-02 NOTE — Telephone Encounter (Signed)
Copied from CRM [id number]. Topic: Clinical - Lab/Test Results >> Oct 01, 2023  1:12 PM Corin V wrote: Reason for CRM: Patient is wanting to review her Xray results from last week.

## 2023-10-02 NOTE — Telephone Encounter (Signed)
 Left message on voicemail to call office.

## 2023-10-02 NOTE — Telephone Encounter (Signed)
Spoke to pt told her I contacted Radiology to read the x-ray. They are running behind but will move it up to be read today. Told her as soon as Judeth Cornfield gets the report we will be in touch. Pt verbalized understanding.

## 2023-10-02 NOTE — Telephone Encounter (Signed)
Patient returned call. Requests to be called. 

## 2023-10-02 NOTE — Progress Notes (Signed)
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 Let me know if you would like to see our sports medicine provider for recommendations on treatment or I can send you to our physical therapy providers to help the pain you are having.

## 2023-10-02 NOTE — Telephone Encounter (Signed)
Called Radiology and spoke to Diane asked her to please have x-ray rad from 09/25/2023. Diane said she will put it as a STAT read so we should see report soon.

## 2023-10-05 ENCOUNTER — Other Ambulatory Visit: Payer: Self-pay | Admitting: Family Medicine

## 2023-10-06 ENCOUNTER — Telehealth: Payer: Self-pay | Admitting: Family Medicine

## 2023-10-06 NOTE — Telephone Encounter (Signed)
 I called and reviewed results with pt but didn't have access to the result note to document on that. Pt does not wish to see PT nor sports meds at this time, if she changes her mind she will contact us.

## 2023-10-06 NOTE — Telephone Encounter (Unsigned)
 Copied from CRM [id number]. Topic: Clinical - Lab/Test Results >> Oct 01, 2023  1:12 PM Corin V wrote: Reason for CRM: Patient is wanting to review her Xray results from last week. >> Oct 06, 2023 11:39 AM [place] J wrote: Patient would like a call back for her imaging results.

## 2023-10-14 NOTE — Progress Notes (Signed)
 Spoke with pt about xray results would like to do PT in [place] will put in referral.

## 2023-10-15 ENCOUNTER — Other Ambulatory Visit: Payer: Self-pay

## 2023-10-15 DIAGNOSIS — M1990 Unspecified osteoarthritis, unspecified site: Secondary | ICD-10-CM

## 2023-10-15 DIAGNOSIS — M25552 Pain in left hip: Secondary | ICD-10-CM

## 2023-11-03 LAB — HM MAMMOGRAPHY

## 2023-11-06 ENCOUNTER — Encounter: Payer: Self-pay | Admitting: Family Medicine

## 2023-11-18 ENCOUNTER — Other Ambulatory Visit (HOSPITAL_BASED_OUTPATIENT_CLINIC_OR_DEPARTMENT_OTHER): Payer: Self-pay

## 2023-11-18 ENCOUNTER — Emergency Department (HOSPITAL_BASED_OUTPATIENT_CLINIC_OR_DEPARTMENT_OTHER)
Admission: EM | Admit: 2023-11-18 | Discharge: 2023-11-18 | Disposition: A | Discharge status: Home / Self Care | Attending: Emergency Medicine | Admitting: Emergency Medicine

## 2023-11-18 ENCOUNTER — Other Ambulatory Visit: Payer: Self-pay

## 2023-11-18 ENCOUNTER — Encounter (HOSPITAL_BASED_OUTPATIENT_CLINIC_OR_DEPARTMENT_OTHER): Payer: Self-pay | Admitting: Emergency Medicine

## 2023-11-18 DIAGNOSIS — N3001 Acute cystitis with hematuria: Secondary | ICD-10-CM | POA: Diagnosis not present

## 2023-11-18 DIAGNOSIS — I10 Essential (primary) hypertension: Secondary | ICD-10-CM | POA: Insufficient documentation

## 2023-11-18 DIAGNOSIS — N3000 Acute cystitis without hematuria: Secondary | ICD-10-CM

## 2023-11-18 DIAGNOSIS — R319 Hematuria, unspecified: Secondary | ICD-10-CM | POA: Diagnosis present

## 2023-11-18 DIAGNOSIS — Z87442 Personal history of urinary calculi: Secondary | ICD-10-CM | POA: Diagnosis not present

## 2023-11-18 DIAGNOSIS — R42 Dizziness and giddiness: Secondary | ICD-10-CM | POA: Diagnosis not present

## 2023-11-18 DIAGNOSIS — Z79899 Other long term (current) drug therapy: Secondary | ICD-10-CM | POA: Diagnosis not present

## 2023-11-18 LAB — URINALYSIS, ROUTINE W REFLEX MICROSCOPIC
Bilirubin Urine: NEGATIVE
Glucose, UA: NEGATIVE mg/dL
Ketones, ur: NEGATIVE mg/dL
Nitrite: NEGATIVE
Protein, ur: 30 mg/dL — AB
Specific Gravity, Urine: 1.017 (ref 1.005–1.030)
WBC, UA: >50 WBC/hpf (ref 0–5)
pH: 5.5 (ref 5.0–8.0)

## 2023-11-18 LAB — CBC
HCT: 33.7 % — ABNORMAL LOW (ref 36.0–46.0)
Hemoglobin: 11.2 g/dL — ABNORMAL LOW (ref 12.0–15.0)
MCH: 31.2 pg (ref 26.0–34.0)
MCHC: 33.2 g/dL (ref 30.0–36.0)
MCV: 93.9 fL (ref 80.0–100.0)
Platelets: 230 10*3/uL (ref 150–400)
RBC: 3.59 MIL/uL — ABNORMAL LOW (ref 3.87–5.11)
RDW: 12.5 % (ref 11.5–15.5)
WBC: 10.8 10*3/uL — ABNORMAL HIGH (ref 4.0–10.5)
nRBC: 0 % (ref 0.0–0.2)

## 2023-11-18 LAB — BASIC METABOLIC PANEL WITH GFR
Anion gap: 10 (ref 5–15)
BUN: 20 mg/dL (ref 8–23)
CO2: 30 mmol/L (ref 22–32)
Calcium: 8.5 mg/dL — ABNORMAL LOW (ref 8.9–10.3)
Chloride: 98 mmol/L (ref 98–111)
Creatinine, Ser: 1.03 mg/dL — ABNORMAL HIGH (ref 0.44–1.00)
GFR, Estimated: 54 mL/min — ABNORMAL LOW (ref >60)
Glucose, Bld: 128 mg/dL — ABNORMAL HIGH (ref 70–99)
Potassium: 3.2 mmol/L — ABNORMAL LOW (ref 3.5–5.1)
Sodium: 138 mmol/L (ref 135–145)

## 2023-11-18 LAB — RESP PANEL BY RT-PCR (RSV, FLU A&B, COVID)  RVPGX2
Influenza A by PCR: NEGATIVE
Influenza B by PCR: NEGATIVE
Resp Syncytial Virus by PCR: NEGATIVE
SARS Coronavirus 2 by RT PCR: NEGATIVE

## 2023-11-18 MED ORDER — CEPHALEXIN 250 MG PO CAPS
500.0000 mg | ORAL_CAPSULE | Freq: Once | ORAL | Status: AC
  Administered 2023-11-18: 500 mg via ORAL
  Filled 2023-11-18: qty 2

## 2023-11-18 MED ORDER — CEPHALEXIN 500 MG PO CAPS
500.0000 mg | ORAL_CAPSULE | Freq: Two times a day (BID) | ORAL | 0 refills | Status: AC
Start: 2023-11-18 — End: 2023-11-23
  Filled 2023-11-18: qty 10, 5d supply, fill #0

## 2023-11-18 NOTE — ED Provider Notes (Signed)
 [place] [facility]MENT AT [facility] Provider Note   CSN: [id number] Arrival date & time: 11/18/23  1117     History  Chief Complaint  Patient presents with   Dizziness   Hematuria    [name] is a 83 y.o. female.  Patient here with some blood in her urine and some urinary frequency and discomfort.  Some lightheadedness body aches viral symptoms ear pain.  Denies any chest pain shortness of breath cough sputum production.  Denies any fever at home.  Denies any abdominal pain flank pain.  History of high cholesterol arthritis SVT irritable bowel syndrome hypertension kidney stones.  The history is provided by the patient.       Home Medications Prior to Admission medications   Medication Sig Start Date End Date Taking? Authorizing Provider  cephALEXin (KEFLEX) 500 MG capsule Take 1 capsule (500 mg total) by mouth 2 (two) times daily for 5 days. 11/18/23 11/23/23 Yes Tushar Enns, DO  pantoprazole (PROTONIX) 20 MG tablet Take 20 mg by mouth daily. 09/26/23  Yes [provider]  ALPRAZolam (XANAX) 0.25 MG tablet TAKE 1 TABLET (0.25 MG TOTAL) BY MOUTH DAILY AS NEEDED. FOR ANXIETY 10/05/23   Shelva Majestic, MD  Calcium Carbonate-Vitamin D (CALCIUM + D PO) Take 600 mg by mouth daily.    [provider]  diltiazem (CARDIZEM CD) 240 MG 24 hr capsule TAKE 1 CAPSULE BY MOUTH EVERY DAY 03/31/23   Shelva Majestic, MD  dorzolamide-timolol (COSOPT) 22.3-6.8 MG/ML ophthalmic solution Place 1 drop into both eyes 2 (two) times daily. 06/29/17   [provider]  escitalopram (LEXAPRO) 20 MG tablet TAKE 1 TABLET BY MOUTH EVERY DAY 04/03/23   Shelva Majestic, MD  hydrochlorothiazide (HYDRODIURIL) 25 MG tablet TAKE 1 TABLET BY MOUTH EVERY DAY 03/16/23   Shelva Majestic, MD  melatonin 5 MG TABS Take 5 mg by mouth at bedtime as needed (Sleep).    [provider]  rosuvastatin (CRESTOR) 10 MG tablet TAKE 1 TABLET (10 MG TOTAL) BY MOUTH ONCE A  WEEK. 08/24/23   Shelva Majestic, MD  valsartan (DIOVAN) 320 MG tablet TAKE 1 TABLET BY MOUTH EVERY DAY 03/16/23   Shelva Majestic, MD      Allergies    Codeine    Review of Systems   Review of Systems  Physical Exam Updated Vital Signs BP (!) 130/57 (BP Location: Right Arm)   Pulse 76   Temp 98.4 F (36.9 C) (Oral)   Resp 20   Ht 5\' 1"  (1.549 m)   Wt 51 kg   LMP 08/12/1991   SpO2 98%   BMI 21.24 kg/m  Physical Exam Vitals and nursing note reviewed.  Constitutional:      General: She is not in acute distress.    Appearance: She is well-developed. She is not ill-appearing.  HENT:     Head: Normocephalic and atraumatic.     Nose: Nose normal.     Mouth/Throat:     Mouth: Mucous membranes are moist.  Eyes:     Extraocular Movements: Extraocular movements intact.     Conjunctiva/sclera: Conjunctivae normal.     Pupils: Pupils are equal, round, and reactive to light.  Cardiovascular:     Rate and Rhythm: Normal rate and regular rhythm.     Pulses: Normal pulses.     Heart sounds: Normal heart sounds. No murmur heard. Pulmonary:     Effort: Pulmonary effort is normal. No respiratory distress.  Breath sounds: Normal breath sounds.  Abdominal:     Palpations: Abdomen is soft.     Tenderness: There is no abdominal tenderness. There is no left CVA tenderness.  Musculoskeletal:        General: No swelling.     Cervical back: Normal range of motion and neck supple.  Skin:    General: Skin is warm and dry.     Capillary Refill: Capillary refill takes less than 2 seconds.  Neurological:     Mental Status: She is alert.  Psychiatric:        Mood and Affect: Mood normal.     ED Results / Procedures / Treatments   Labs (all labs ordered are listed, but only abnormal results are displayed) Labs Reviewed  BASIC METABOLIC PANEL WITH GFR - Abnormal; Notable for the following components:      Result Value   Potassium 3.2 (*)    Glucose, Bld 128 (*)    Creatinine, Ser  1.03 (*)    Calcium 8.5 (*)    GFR, Estimated 54 (*)    All other components within normal limits  CBC - Abnormal; Notable for the following components:   WBC 10.8 (*)    RBC 3.59 (*)    Hemoglobin 11.2 (*)    HCT 33.7 (*)    All other components within normal limits  URINALYSIS, ROUTINE W REFLEX MICROSCOPIC - Abnormal; Notable for the following components:   APPearance CLOUDY (*)    Hgb urine dipstick MODERATE (*)    Protein, ur 30 (*)    Leukocytes,Ua LARGE (*)    Bacteria, UA RARE (*)    All other components within normal limits  RESP PANEL BY RT-PCR (RSV, FLU A&B, COVID)  RVPGX2  URINE CULTURE  CBG MONITORING, ED    EKG EKG Interpretation Date/Time:  Wednesday November 18 2023 11:28:30 EDT Ventricular Rate:  78 PR Interval:  174 QRS Duration:  94 QT Interval:  352 QTC Calculation: 401 R Axis:   -61  Text Interpretation: Normal sinus rhythm Left axis deviation Confirmed by Virgina Norfolk [phone]) on 11/18/2023 12:24:31 PM  Radiology No results found.  Procedures Procedures    Medications Ordered in ED Medications  cephALEXin (KEFLEX) capsule 500 mg (has no administration in time range)    ED Course/ Medical Decision Making/ A&P                                 Medical Decision Making Amount and/or Complexity of Data Reviewed Labs: ordered.  Risk Prescription drug management.   [name] is here with generalized weakness, blood in her urine.  She has history of kidney stone depression anxiety hypertension.  Normal vitals.  No fever.  She has no abdominal pain no flank pain.  Seems less likely to be kidney stone.  Differential diagnosis likely UTI versus viral process will check basic labs urinalysis viral testing.  She is neurologically intact.  I am no concern for stroke or other acute neurologic problem.  Have no concern for intra-abdominal process at this time.  Overall urinalysis seems positive for infection.  There is no significant leukocytosis anemia  electrolyte abnormality otherwise.  Have no concern for sepsis.  She is very comfortable and have no concern for kidney stone at this time.  Will treat with Keflex.  She understands return if she develops abdominal pain flank pain, fever, worsening symptoms.  Recommend close follow-up with  primary care doctor to make sure hematuria is resolved or she might need referral to urology.  Discharged in good condition.  This chart was dictated using voice recognition software.  Despite best efforts to proofread,  errors can occur which can change the documentation meaning.         Final Clinical Impression(s) / ED Diagnoses Final diagnoses:  Acute cystitis without hematuria    Rx / DC Orders ED Discharge Orders          Ordered    cephALEXin (KEFLEX) 500 MG capsule  2 times daily        11/18/23 1300              Dorreen Valiente, DO 11/18/23 1301

## 2023-11-18 NOTE — ED Notes (Signed)
 Spoke with lab about urine culture add on.

## 2023-11-18 NOTE — Discharge Instructions (Addendum)
 Take next dose of antibiotic either before bedtime tonight or in the morning tomorrow.  Return if you develop severe pain, fever, abdominal pain as we discussed.  Follow-up with your primary care doctor.  If you continue to notice blood in the urine but remaining symptomatic as well please follow-up care doctor about that as well you may need referral to urology.  My hope is that antibiotic will clear this up.

## 2023-11-18 NOTE — Telephone Encounter (Signed)
 Team it does not look like she ever got this message about the prior albumin to creatinine ratio lab error-she has a visit upcoming next month on May 5-I would like to discuss with her in person-I may adjust her blood pressure medicines to try to be more proactive but prefer to discuss in person

## 2023-11-18 NOTE — ED Triage Notes (Signed)
 Pt via pov from home with with dizziness, body aches, ear ache, chils x 2 days. States she had one episode of hematuria yesterday. She is having increased frequency of urination.denies pain upon urination. Pt alert & oriented, nad noted.

## 2023-11-20 ENCOUNTER — Ambulatory Visit: Admitting: Family

## 2023-11-20 ENCOUNTER — Encounter: Payer: Self-pay | Admitting: Family

## 2023-11-20 ENCOUNTER — Ambulatory Visit: Payer: Self-pay

## 2023-11-20 VITALS — BP 132/76 | HR 83 | Temp 99.5°F | Ht 61.0 in | Wt 110.4 lb

## 2023-11-20 DIAGNOSIS — N3000 Acute cystitis without hematuria: Secondary | ICD-10-CM | POA: Diagnosis not present

## 2023-11-20 DIAGNOSIS — R531 Weakness: Secondary | ICD-10-CM

## 2023-11-20 DIAGNOSIS — R110 Nausea: Secondary | ICD-10-CM | POA: Diagnosis not present

## 2023-11-20 DIAGNOSIS — E876 Hypokalemia: Secondary | ICD-10-CM

## 2023-11-20 LAB — URINE CULTURE: Culture: >=100000 — AB

## 2023-11-20 MED ORDER — ONDANSETRON 4 MG PO TBDP: 4.0000 mg | ORAL_TABLET | Freq: Three times a day (TID) | ORAL | 0 refills | Status: AC | PRN

## 2023-11-20 NOTE — Telephone Encounter (Signed)
 Copied from CRM [id number]. Topic: Clinical - Red Word Triage >> Nov 20, 2023 10:53 AM [place] A wrote: [facility] that prompted transfer to Nurse Triage: Patient was ER Wednesday for UTI she misread prescription and has doubled the amount she should take. Patient is not getting better   Chief Complaint: Seen in ED with UTI 11/18/23, on antibiotic, no better. Weak. Symptoms: Above Frequency: This week Pertinent Negatives: Patient denies fever Disposition: [] ED /[] Urgent Care (no appt availability in office) / [x] Appointment(In office/virtual)/ []  [facility]/ [] Home Care/ [] Refused Recommended Disposition /[] [facility]/ []  Follow-up with PCP Additional Notes: Agrees with appointment. Pt. Doubled up on antibiotics by mistake.  Reason for Disposition  Urinating more frequently than usual (i.e., frequency)  Answer Assessment - Initial Assessment Questions 1. SYMPTOM: "What's the main symptom you're concerned about?" (e.g., frequency, incontinence)     Weak, no appetite 2. ONSET: "When did the    start?"     Tuesday 3. PAIN: "Is there any pain?" If Yes, ask: "How bad is it?" (Scale: 1-10; mild, moderate, severe)     No 4. CAUSE: "What do you think is causing the symptoms?"     UTI 5. OTHER SYMPTOMS: "Do you have any other symptoms?" (e.g., blood in urine, fever, flank pain, pain with urination)     No 6. PREGNANCY: "Is there any chance you are pregnant?" "When was your last menstrual period?"     NO  Protocols used: Urinary Symptoms-A-AH

## 2023-11-20 NOTE — Progress Notes (Signed)
 Patient ID: [name], female    DOB: [birth year], 83 y.o.   MRN: [id number]  Chief Complaint  Patient presents with   Urinary Tract Infection    Pt c/o chills, blood in urine and weakness. Present for 4 days, seen in ED on 04/09. Currently taking keflex. Pt has been taking 2 pills in the am and 2 pills in pm.  Discussed the use of AI scribe software for clinical note transcription with the patient, who gave verbal consent to proceed.  History of Present Illness The patient, with a history of bladder infections, presents with persistent symptoms despite antibiotic treatment with Keflex. She reports general malaise, fatigue, nausea and some confusion. She also reports decreased appetite and increased thirst. The patient's daughter notes that the patient has been weak and has had a significantly decreased food intake. She denies any diarrhea or constipation. She also reports a history of leg aches, which have been ongoing and often wake the patient from sleep. She lives alone and has been experiencing chills and a feeling of coldness despite having the heat on at home. She has been drinking water but has had a decreased food intake. The patient's daughter reports that the patient has been feeling cold since the onset of the current illness.  Assessment & Plan Urinary Tract Infection (UTI) - UTI diagnosed, Keflex prescribed. Incorrect dosing believed to be causing adverse effects. Denies any urinary sx. Persistent symptoms include mild disorientation, and chills. Culture results support antibiotic choice. - Take remaining Keflex doses: one in the morning, one at night after eating tomorrow. - Monitor symptoms; seek urgent care if worsening or fever > 100. - Sending  Zofran 4mg  tid prn for nausea. - Ensure adequate hydration.  Electrolyte Imbalance - Low potassium linked to weakness, nausea, and leg cramps. Hydrochlorothiazide may contribute to depletion. - Discontinue hydrochlorothiazide  until follow-up with Dr. Durene Cal in May. Continue all other medications. - Encourage continued hydration to maintain electrolyte balance.  Fatigue/Nausea  - Inadequate intake in addition to antibiotic and hypokalemia causing weakness, nausea and malaise. Nutritional supplements advised. Advised to stop HCTZ. - Encourage nutritional supplement drinks like Boost or Ensure. - Use Zofran to manage nausea and improve appetite.   Subjective:    Outpatient Medications Prior to Visit  Medication Sig Dispense Refill   ALPRAZolam (XANAX) 0.25 MG tablet TAKE 1 TABLET (0.25 MG TOTAL) BY MOUTH DAILY AS NEEDED. FOR ANXIETY 30 tablet 0   Calcium Carbonate-Vitamin D (CALCIUM + D PO) Take 600 mg by mouth daily.     cephALEXin (KEFLEX) 500 MG capsule Take 1 capsule (500 mg total) by mouth 2 (two) times daily for 5 days. 10 capsule 0   diltiazem (CARDIZEM CD) 240 MG 24 hr capsule TAKE 1 CAPSULE BY MOUTH EVERY DAY 90 capsule 3   dorzolamide-timolol (COSOPT) 22.3-6.8 MG/ML ophthalmic solution Place 1 drop into both eyes 2 (two) times daily.     escitalopram (LEXAPRO) 20 MG tablet TAKE 1 TABLET BY MOUTH EVERY DAY 90 tablet 3   hydrochlorothiazide (HYDRODIURIL) 25 MG tablet TAKE 1 TABLET BY MOUTH EVERY DAY 90 tablet 3   melatonin 5 MG TABS Take 5 mg by mouth at bedtime as needed (Sleep).     pantoprazole (PROTONIX) 20 MG tablet Take 20 mg by mouth daily.     rosuvastatin (CRESTOR) 10 MG tablet TAKE 1 TABLET (10 MG TOTAL) BY MOUTH ONCE A WEEK. 12 tablet 4   valsartan (DIOVAN) 320 MG tablet TAKE 1 TABLET BY  MOUTH EVERY DAY 90 tablet 3   No facility-administered medications prior to visit.   Past Medical History:  Diagnosis Date   Anxiety    Arthritis    Depression    Diverticulosis of colon 03/11/2007   Frequency of urination    GERD (gastroesophageal reflux disease) 10/14/2012   Glaucoma, both eyes    Hemorrhoids    Hiatal hernia    History of adenomatous polyp of colon    History of basal cell  carcinoma (BCC) excision    History of Clostridioides difficile colitis    12/ 2022;  x2 in 2023 last one 07/ 2023   History of diverticulitis of colon    History of esophageal stricture 10/14/2012   s/p  dilatation 2014  and 01-02-2021 by dr Leone Payor   History of gout 07/2021   flare-up bilateral feet   History of kidney stones    Hyperlipidemia 09/22/2007   Hypertension    Irritable bowel syndrome with diarrhea    Osteopenia 02/09/2015   T score -1.4 FRAX 7.3%/0.6%   Posterior vaginal wall prolapse    PSVT (paroxysmal supraventricular tachycardia) (HCC) 08/2011   cardiologist--- dr Johney Frame;   first dx post op ACDF 08-14-2011   Rosacea 03/11/2007   SUI (stress urinary incontinence, female)    Type 2 diabetes, diet controlled (HCC)    followed by pcp   Uterovaginal prolapse, incomplete    Wears hearing aid in both ears    Past Surgical History:  Procedure Laterality Date   ANTERIOR AND POSTERIOR REPAIR WITH SACROSPINOUS FIXATION N/A 09/18/2022   Procedure: POSTERIOR REPAIR WITH PERINEORRHAPHY AND SACROSPINOUS FIXATION;  Surgeon: Marguerita Beards, MD;  Location: [facility];  Service: Gynecology;  Laterality: N/A;  Total time requested is 1.5 hrs   ANTERIOR CERVICAL DECOMP/DISCECTOMY FUSION  05/10/2008   @[facility]  by dr Channing Mutters;   C4-6   ANTERIOR CERVICAL DECOMP/DISCECTOMY FUSION  08/14/2011   Procedure: ANTERIOR CERVICAL DECOMPRESSION/DISCECTOMY FUSION 1 LEVEL/HARDWARE REMOVAL;  Surgeon: Dorian Heckle, MD;  Location: [facility];  Service: Neurosurgery;  Laterality: N/A;  Cervical Six-Seven anterior cervical decompression with fusion interbody prothesis plating and bonegraft with removal of hardware of Cervical Four to Cervical Six    BIOPSY  09/26/2021   Procedure: BIOPSY;  Surgeon: Rachael Fee, MD;  Location: [facility];  Service: Endoscopy;;   BLADDER SUSPENSION N/A 09/18/2022   Procedure: TRANSVAGINAL TAPE (TVT) PROCEDURE (POSSIBLE);  Surgeon: Marguerita Beards, MD;  Location: [facility];  Service: Gynecology;  Laterality: N/A;   BUNIONECTOMY     1980s   COLONOSCOPY N/A 09/26/2021   Procedure: COLONOSCOPY;  Surgeon: Rachael Fee, MD;  Location: [facility];  Service: Endoscopy;  Laterality: N/A;   CYSTOSCOPY N/A 09/18/2022   Procedure: CYSTOSCOPY;  Surgeon: Marguerita Beards, MD;  Location: [facility];  Service: Gynecology;  Laterality: N/A;   ESOPHAGOGASTRODUODENOSCOPY (EGD) WITH ESOPHAGEAL DILATION  01/02/2021   dr Leone Payor   KIDNEY SURGERY  1960   age 79-   Open kidney stone removal   KNEE ARTHROSCOPY W/ MENISCAL REPAIR Left 05/2014   TONSILLECTOMY     child   Allergies  Allergen Reactions   Codeine Nausea Only      Objective:    Physical Exam Vitals and nursing note reviewed.  Constitutional:      Appearance: Normal appearance.  Cardiovascular:     Rate and Rhythm: Normal rate and regular rhythm.  Pulmonary:     Effort: Pulmonary  effort is normal.     Breath sounds: Normal breath sounds.  Musculoskeletal:        General: Normal range of motion.  Skin:    General: Skin is warm and dry.  Neurological:     Mental Status: She is alert.  Psychiatric:        Mood and Affect: Mood normal.        Behavior: Behavior normal.    BP 132/76 (BP Location: Left Arm, Patient Position: Sitting, Cuff Size: Normal)   Pulse 83   Temp 99.5 F (37.5 C) (Temporal)   Ht 5\' 1"  (1.549 m)   Wt 110 lb 6 oz (50.1 kg)   LMP 08/12/1991   SpO2 98%   BMI 20.86 kg/m  Wt Readings from Last 3 Encounters:  11/20/23 110 lb 6 oz (50.1 kg)  11/18/23 112 lb 7 oz (51 kg)  09/25/23 112 lb 6.4 oz (51 kg)   *Extra time ( ) spent with patient today due to complexity of symptoms, which consisted of chart review including recent ER notes/labs, discussing diagnoses, work up, treatment, answering questions, and documentation.    Dulce Sellar, NP

## 2023-11-20 NOTE — Patient Instructions (Addendum)
 It was very nice to see you today!   STOP the Hydrochlorothiazide until you see Dr Durene Cal. This could be contributing to your low potassium level. Finish the antibiotic pills tomorrow - 1 pill after eating twice tomorrow. Continue to hydrate with water daily. Try to get extra calories with Boost or Ensure, comes in juice form also. I am also sending Zofran to take for nausea as needed, place under your tongue. Continue all of your other medications.  Go to one of our urgent care centers or the [facility] if you start feeling worse over the weekend or fever is higher than 100.      PLEASE NOTE:  If you had any lab tests please let us know if you have not heard back within a few days. You may see your results on MyChart before we have a chance to review them but we will give you a call once they are reviewed by [place]. If we ordered any referrals today, please let us know if you have not heard from their office within the next week.

## 2023-11-21 ENCOUNTER — Telehealth (HOSPITAL_BASED_OUTPATIENT_CLINIC_OR_DEPARTMENT_OTHER): Payer: Self-pay

## 2023-11-21 NOTE — Telephone Encounter (Signed)
 Post ED Visit - Positive Culture Follow-up  Culture report reviewed by antimicrobial stewardship pharmacist: [facility] [x]  Dionicio Fray, Pharm.D. []  Skeet Duke, Pharm.D., BCPS AQ-ID []  Leslee Rase, Pharm.D., BCPS []  Garland Junk, [address].D., BCPS []  [place], [address].D., BCPS, AAHIVP []  Alcide Aly, Pharm.D., BCPS, AAHIVP []  Jerri Morale, PharmD, BCPS []  Graham Laws, PharmD, BCPS []  Cleda Curly, PharmD, BCPS []  Tamar Fairly, PharmD []  Ballard Levels, PharmD, BCPS []  Ollen Beverage, PharmD  [facility] []  Arlyne Bering, PharmD []  Sherryle Don, PharmD []  Van Gelinas, PharmD []  Delila Felty, Rph []  [place]) Cleora Daft, PharmD []  Augustina Block, PharmD []  Arie Kurtz, PharmD []  Sharlyn Deaner, PharmD []  Agnes Hose, PharmD []  Kendall Pauls, PharmD []  Gladstone Lamer, PharmD []  Armanda Bern, PharmD []  Tera Fellows, PharmD   Positive urine culture Treated with Cephalexin, organism sensitive to the same and no further patient follow-up is required at this time.  Delena Feil 11/21/2023, 11:48 AM

## 2023-12-14 ENCOUNTER — Ambulatory Visit: Payer: Medicare Other | Admitting: Family Medicine

## 2023-12-15 ENCOUNTER — Ambulatory Visit: Admitting: Family Medicine

## 2023-12-15 ENCOUNTER — Encounter: Payer: Self-pay | Admitting: Family Medicine

## 2023-12-15 VITALS — BP 130/70 | HR 79 | Temp 98.3°F | Ht 61.0 in | Wt 112.2 lb

## 2023-12-15 DIAGNOSIS — E119 Type 2 diabetes mellitus without complications: Secondary | ICD-10-CM

## 2023-12-15 DIAGNOSIS — E1169 Type 2 diabetes mellitus with other specified complication: Secondary | ICD-10-CM

## 2023-12-15 DIAGNOSIS — I10 Essential (primary) hypertension: Secondary | ICD-10-CM

## 2023-12-15 DIAGNOSIS — K219 Gastro-esophageal reflux disease without esophagitis: Secondary | ICD-10-CM | POA: Diagnosis not present

## 2023-12-15 DIAGNOSIS — F324 Major depressive disorder, single episode, in partial remission: Secondary | ICD-10-CM | POA: Diagnosis not present

## 2023-12-15 DIAGNOSIS — E785 Hyperlipidemia, unspecified: Secondary | ICD-10-CM

## 2023-12-15 DIAGNOSIS — R252 Cramp and spasm: Secondary | ICD-10-CM

## 2023-12-15 DIAGNOSIS — R519 Headache, unspecified: Secondary | ICD-10-CM

## 2023-12-15 MED ORDER — ALPRAZOLAM 0.25 MG PO TABS: 0.2500 mg | ORAL_TABLET | Freq: Every day | ORAL | 0 refills | Status: DC | PRN

## 2023-12-15 NOTE — Patient Instructions (Addendum)
  Please call [phone] to schedule a visit with [facility] Dr. Ladonna Pickup specifically at our office - please tell the office you were directly referred by Dr. Arlene Ben - I have placed referral but you still have to call  Labs fasting if possible  ALSO on day of appointment you planned to come back for labs we ordered today- you will need to call our main office for that -if you could get these sooner would be ore ideal with the headache pattern- if we don't find a cause on these reach back out and I will order brain MRI   -also recommended updated eye exam- see if you can move this up  Try low dose magnesium  glycinate before bed- chat with pharmacist to find a good one- stop for loose stool  Recommended follow up: Return in about 3 months (around 03/16/2024) for followup or sooner if needed.Schedule b4 you leave.

## 2023-12-15 NOTE — Progress Notes (Signed)
 Phone [phone] In person visit   Subjective:   [name] is a 83 y.o. year old very pleasant female patient who presents for/with See problem oriented charting Chief Complaint  Patient presents with   Medical Management of Chronic Issues   Diabetes   Hypertension   bilateral leg pain    Pt c/o bilateral leg pain and cramps x2 months.   Past Medical History-  Patient Active Problem List   Diagnosis Date Noted   Diabetes mellitus without complication (HCC) 03/23/2019    Priority: High   History of supraventricular tachycardia 08/14/2011    Priority: High   Osteopenia of left femoral neck 08/16/2021    Priority: Medium    Aortic atherosclerosis (HCC) 07/21/2019    Priority: Medium    GERD (gastroesophageal reflux disease) 07/14/2014    Priority: Medium    Primary open angle glaucoma of both eyes, mild stage 05/29/2014    Priority: Medium    IBS (irritable bowel syndrome)- diarrhea predominant 01/26/2013    Priority: Medium    Glaucoma associated with ocular disorder 11/05/2009    Priority: Medium    Hyperlipidemia associated with type 2 diabetes mellitus (HCC) 09/22/2007    Priority: Medium    Major depression in full remission (HCC)- Lexapro  20mg . Xanax  once a week or less. 03/11/2007    Priority: Medium    Essential hypertension 03/11/2007    Priority: Medium    Sensorineural hearing loss (SNHL), bilateral 04/06/2020    Priority: Low   History of colonic polyps 11/28/2011    Priority: Low   Senile nuclear sclerosis 06/29/2011    Priority: Low   Cervical disc disorder with radiculopathy of cervical region 11/05/2009    Priority: Low   Rosacea 03/11/2007    Priority: Low   SVT (supraventricular tachycardia) (HCC) 08/16/2021   Hypokalemia 07/21/2021   C. difficile diarrhea 07/20/2021   Vertigo 04/06/2020   Knee pain, left 11/02/2013    Medications- reviewed and updated Current Outpatient Medications  Medication Sig Dispense Refill   Calcium   Carbonate-Vitamin D (CALCIUM  + D PO) Take 600 mg by mouth daily.     diltiazem  (CARDIZEM  CD) 240 MG 24 hr capsule TAKE 1 CAPSULE BY MOUTH EVERY DAY 90 capsule 3   dorzolamide -timolol  (COSOPT ) 22.3-6.8 MG/ML ophthalmic solution Place 1 drop into both eyes 2 (two) times daily.     escitalopram  (LEXAPRO ) 20 MG tablet TAKE 1 TABLET BY MOUTH EVERY DAY 90 tablet 3   melatonin 5 MG TABS Take 5 mg by mouth at bedtime as needed (Sleep).     pantoprazole  (PROTONIX ) 20 MG tablet Take 20 mg by mouth daily.     rosuvastatin  (CRESTOR ) 10 MG tablet TAKE 1 TABLET (10 MG TOTAL) BY MOUTH ONCE A WEEK. 12 tablet 4   valsartan  (DIOVAN ) 320 MG tablet TAKE 1 TABLET BY MOUTH EVERY DAY 90 tablet 3   ALPRAZolam  (XANAX ) 0.25 MG tablet TAKE 1 TABLET (0.25 MG TOTAL) BY MOUTH DAILY AS NEEDED. FOR ANXIETY (Patient not taking: Reported on 12/15/2023) 30 tablet 0   ondansetron  (ZOFRAN -ODT) 4 MG disintegrating tablet Take 1 tablet (4 mg total) by mouth every 8 (eight) hours as needed for nausea or vomiting. (Patient not taking: Reported on 12/15/2023) 20 tablet 0   No current facility-administered medications for this visit.     Objective:  BP 130/70   Pulse 79   Temp 98.3 F (36.8 C)   Ht 5\' 1"  (1.549 m)   Wt 112 lb 3.2 oz (50.9 kg)  LMP 08/12/1991   SpO2 97%   BMI 21.20 kg/m  Gen: NAD, resting comfortably No pain over right temple with palpation CV: RRR no murmurs rubs or gallops Lungs: CTAB no crackles, wheeze, rhonchi Ext: minimal edema in foot and ankle Skin: warm, dry  Neuro: CN II-XII intact, sensation and reflexes normal throughout, 5/5 muscle strength in bilateral upper and lower extremities. Normal finger to nose. Normal rapid alternating movements. No pronator drift. Normal romberg. Normal gait.   Diabetic foot exam was performed with the following findings:   Normal sensation of 10g monofilament Intact posterior tibialis and dorsalis pedis pulses Bunion on right foot with turning of great toe inward        Assessment and Plan   # right facial pain- everyday intermittently for 6 months- advil  or tylenol  helps. No blurry vision. Cancer on left side in past for skin cancer.  - hurts from forehead into temple area and down into cheek  -Today with reassuring neurology exam - I am going to add ESR and CRP -brain MRI if no obvious cause of headache(s) on labs when she comes in-encouraged her to do this sooner rather than later -also recommended updated eye exam- see if you can move this up  # Diabetes- diagnosed in 2020 with mild microalbuminuria at 57 with correction 12/10/2022 S: Diet controlled.   Lab Results  Component Value Date   HGBA1C 6.6 (H) 06/15/2023   HGBA1C 6.7 (H) 12/10/2022   HGBA1C 6.1 (H) 07/21/2021   A/P: hopefully stable- update a1c today. Continue  no meds for now   I discussed the microalbumin to creatinine lab error with patient that occurred with [place] labs.  Essentially the ratio was off by a factor of 10.  In this patient's individual case her levels will correct to 57 from 5.7 last year that she is already on max dose valsartan  so we will continue that.  She is not interested in adding Jardiance due to urinary tract infection risk and history of urinary urgency after prior prolapse surgery  #hyperlipidemia associated with diabetes/aortic atherosclerosis- incidental finding on prior imaging  S:  rosuvastatin  10 mg weekly - can wake up in middle of night with pain and wakes up and needs cream- cream helps- icy hot helps get back to bed- 3-4 times a week wakes up- can fall asleep and no daytime symptoms for most part- occasional. Mild swelling in ankles. Doesn't seem to correspond to days she takes rosuvastatin  Lab Results  Component Value Date   CHOL 169 12/10/2022   HDL 36.90 (L) 12/10/2022   LDLCALC 93 12/10/2022   LDLDIRECT 95.0 04/18/2021   TRIG 197.0 (H) 12/10/2022   CHOLHDL 5 12/10/2022  A/P: imperfect control but has preferred to remain on lower dose -  continue current medications- update lipids with labs  Cramping unclear cause- doesn't appear to be statin- will come back for labs. Do wonder if may need potassium or magnesium  but now off hydrochlorothiazide    #hypertension/history of SVT- sees cardiology #history of hypokalemia with C. diff S: medication(s): Cardizem  240mg  ER, valsartan  320 mg - hydrochlorothiazide  25 mg was stopped due to low potassium No obvious recurrence of SVT on Cardizem . BP Readings from Last 3 Encounters:  12/15/23 130/70  11/20/23 132/76  11/18/23 (!) 126/56  A/P: stable without hydrochlorothiazide  thankfully- continue valsartan  and cardizem   #Depression/anxiety- lost son to MI 2019 and daughter to pancreatic cancer 2020 and another son to MI 2021 then granddaughter lost husband in [facility] 2021 S: Patient  compliant with Lexapro  20 mg.  Patient takes sparing alprazolam  -reports had therapy in past and didn't like it- 10 or 20 years.  -she feels more down lately. Sleep has been poor and dneeding afternoon nap    12/15/2023    3:50 PM 09/25/2023    2:01 PM 06/15/2023   10:57 AM  Depression screen PHQ 2/9  Decreased Interest 0 0 0  Down, Depressed, Hopeless 3 0 0  PHQ - 2 Score 3 0 0  Altered sleeping 2 0 0  Tired, decreased energy 3 0 0  Change in appetite 0 0 0  Feeling bad or failure about yourself  3 0 0  Trouble concentrating 0 0 0  Moving slowly or fidgety/restless 0 0 0  Suicidal thoughts 0 0 0  PHQ-9 Score 11 0 0  Difficult doing work/chores Somewhat difficult Not difficult at all Not difficult at all  A/P: Depression in partial resmisison only- heavy life situations in recent years- will continue Lexapro  but willing to add therapy with Dr. Ladonna Pickup- referred today  =sparing alprazolam  half tablet for sleep no falls- hoping treating this will help as well . Also takes 5 mg melatonin.   #GERD- with small hiatal hernia on CT- not needing pantoprazle lately thankfully  Recommended follow up: Return in  about 3 months (around 03/16/2024) for followup or sooner if needed.Schedule b4 you leave.   Lab/Order associations:   ICD-10-CM   1. Diabetes mellitus without complication (HCC)  E11.9     2. Essential hypertension  I10     3. Gastroesophageal reflux disease without esophagitis  K21.9     4. Hyperlipidemia associated with type 2 diabetes mellitus (HCC)  E11.69    E78.5     5. Major depressive disorder with single episode, in partial remission (HCC)  F32.4     6. Leg cramping  R25.2     7. Right sided temporal headache  R51.9       No orders of the defined types were placed in this encounter.   Return precautions advised.  Clarisa Crooked, MD

## 2023-12-17 NOTE — Progress Notes (Signed)
 ok

## 2023-12-21 ENCOUNTER — Encounter: Payer: Self-pay | Admitting: Family Medicine

## 2023-12-21 ENCOUNTER — Other Ambulatory Visit (INDEPENDENT_AMBULATORY_CARE_PROVIDER_SITE_OTHER)

## 2023-12-21 ENCOUNTER — Other Ambulatory Visit: Payer: Self-pay | Admitting: Family Medicine

## 2023-12-21 DIAGNOSIS — E119 Type 2 diabetes mellitus without complications: Secondary | ICD-10-CM

## 2023-12-21 DIAGNOSIS — R252 Cramp and spasm: Secondary | ICD-10-CM | POA: Diagnosis not present

## 2023-12-21 DIAGNOSIS — R519 Headache, unspecified: Secondary | ICD-10-CM | POA: Diagnosis not present

## 2023-12-21 DIAGNOSIS — E785 Hyperlipidemia, unspecified: Secondary | ICD-10-CM | POA: Diagnosis not present

## 2023-12-21 DIAGNOSIS — I10 Essential (primary) hypertension: Secondary | ICD-10-CM | POA: Diagnosis not present

## 2023-12-21 DIAGNOSIS — E1169 Type 2 diabetes mellitus with other specified complication: Secondary | ICD-10-CM

## 2023-12-21 LAB — CBC WITH DIFFERENTIAL/PLATELET
Basophils Absolute: 0 10*3/uL (ref 0.0–0.1)
Basophils Relative: 0.6 % (ref 0.0–3.0)
Eosinophils Absolute: 0.1 10*3/uL (ref 0.0–0.7)
Eosinophils Relative: 1.4 % (ref 0.0–5.0)
HCT: 37.1 % (ref 36.0–46.0)
Hemoglobin: 12.2 g/dL (ref 12.0–15.0)
Lymphocytes Relative: 28.3 % (ref 12.0–46.0)
Lymphs Abs: 2.4 10*3/uL (ref 0.7–4.0)
MCHC: 32.9 g/dL (ref 30.0–36.0)
MCV: 94.2 fl (ref 78.0–100.0)
Monocytes Absolute: 0.6 10*3/uL (ref 0.1–1.0)
Monocytes Relative: 7.1 % (ref 3.0–12.0)
Neutro Abs: 5.3 10*3/uL (ref 1.4–7.7)
Neutrophils Relative %: 62.6 % (ref 43.0–77.0)
Platelets: 324 10*3/uL (ref 150.0–400.0)
RBC: 3.94 Mil/uL (ref 3.87–5.11)
RDW: 13.2 % (ref 11.5–15.5)
WBC: 8.5 10*3/uL (ref 4.0–10.5)

## 2023-12-21 LAB — COMPREHENSIVE METABOLIC PANEL WITH GFR
ALT: 13 U/L (ref 0–35)
AST: 14 U/L (ref 0–37)
Albumin: 4.1 g/dL (ref 3.5–5.2)
Alkaline Phosphatase: 63 U/L (ref 39–117)
BUN: 16 mg/dL (ref 6–23)
CO2: 29 meq/L (ref 19–32)
Calcium: 9 mg/dL (ref 8.4–10.5)
Chloride: 103 meq/L (ref 96–112)
Creatinine, Ser: 0.64 mg/dL (ref 0.40–1.20)
GFR: 80.84 mL/min (ref >60.00)
Glucose, Bld: 125 mg/dL — ABNORMAL HIGH (ref 70–99)
Potassium: 3.6 meq/L (ref 3.5–5.1)
Sodium: 142 meq/L (ref 135–145)
Total Bilirubin: 0.6 mg/dL (ref 0.2–1.2)
Total Protein: 7.1 g/dL (ref 6.0–8.3)

## 2023-12-21 LAB — LIPID PANEL
Cholesterol: 159 mg/dL (ref 0–200)
HDL: 35.8 mg/dL — ABNORMAL LOW (ref >39.00)
LDL Cholesterol: 80 mg/dL (ref 0–99)
NonHDL: 123.14
Total CHOL/HDL Ratio: 4
Triglycerides: 218 mg/dL — ABNORMAL HIGH (ref 0.0–149.0)
VLDL: 43.6 mg/dL — ABNORMAL HIGH (ref 0.0–40.0)

## 2023-12-21 LAB — SEDIMENTATION RATE: Sed Rate: 42 mm/h — ABNORMAL HIGH (ref 0–30)

## 2023-12-21 LAB — MAGNESIUM: Magnesium: 1.8 mg/dL (ref 1.5–2.5)

## 2023-12-21 LAB — MICROALBUMIN / CREATININE URINE RATIO
Creatinine,U: 77.4 mg/dL
Microalb Creat Ratio: 226.1 mg/g — ABNORMAL HIGH (ref 0.0–30.0)
Microalb, Ur: 17.5 mg/dL — ABNORMAL HIGH (ref 0.0–1.9)

## 2023-12-21 LAB — C-REACTIVE PROTEIN: CRP: <1 mg/dL (ref 0.5–20.0)

## 2023-12-21 LAB — HEMOGLOBIN A1C: Hgb A1c MFr Bld: 6.5 % (ref 4.6–6.5)

## 2023-12-23 ENCOUNTER — Ambulatory Visit: Payer: Self-pay

## 2023-12-23 ENCOUNTER — Telehealth: Payer: Self-pay

## 2023-12-23 NOTE — Telephone Encounter (Signed)
 Chief Complaint: abdominal pain Symptoms: pain, constipation Frequency: 2 days Pertinent Negatives: Patient denies fever, bloody stools, diarrhea, vomiting, CP, SOB, back pain, dysuria  Disposition: [] ED /[x] Urgent Care (no appt availability in office) / [] Appointment(In office/virtual)/ []  [place] [facility]/ [] Home Care/ [x] Refused Recommended Disposition /[]  Mobile Bus/ []  Follow-up with PCP Additional Notes: Pt reports constant abdominal pain across the lower abdomen for 2 days. Pt concerned this may be diverticulitis. Pt reports she has not had a normal, good BM in 2 days. Pt states this AM the pain was 10/10 and she was screaming in pain. Pain is now a 7/10. Pt states she had nausea last night but denies any today. Denies vomiting. Denies bloody stools. Denies fever, back pain, dysuria. States she was treated for a UTI 4/9 w/ Keflex  in the ED. RN advised pt she should be seen by a HCP within 4 hrs and advised UC/ED. Pt declined, stating she will see how she feels tomorrow. RN advised pt to drink fluids, pt states she has been drinking gingerale and is able to keep it down. RN advised the pt if she develops worsening or severe pain, vomiting, bloody stools, fever, CP, or SOB she needs to go to the ED. Pt verbalized understanding.      Copied From CRM [id number]. Reason for Triage: Patient is requesting a callback she said she is having mild stomach cramps and she wants to know if it could possibly be her diffficulitis, patient can be reached at [phone]  Reason for Disposition  [1] MILD-MODERATE pain AND [2] constant AND [3] present > 2 hours  Answer Assessment - Initial Assessment Questions 1. LOCATION: "Where does it hurt?"      Right across the lower abdomen  2. RADIATION: "Does the pain shoot anywhere else?" (e.g., chest, back)     No  3. ONSET: "When did the pain begin?" (e.g., minutes, hours or days ago)      Yesterday  4. SUDDEN: "Gradual or sudden onset?"      Suddenly  5. PATTERN "Does the pain come and go, or is it constant?"    - If it comes and goes: "How long does it last?" "Do you have pain now?"     (Note: Comes and goes means the pain is intermittent. It goes away completely between bouts.)    - If constant: "Is it getting better, staying the same, or getting worse?"      (Note: Constant means the pain never goes away completely; most serious pain is constant and gets worse.)      Constant for the last 2 days 6. SEVERITY: "How bad is the pain?"  (e.g., Scale 1-10; mild, moderate, or severe)    - MILD (1-3): Doesn't interfere with normal activities, abdomen soft and not tender to touch.     - MODERATE (4-7): Interferes with normal activities or awakens from sleep, abdomen tender to touch.     - SEVERE (8-10): Excruciating pain, doubled over, unable to do any normal activities.       10/10 this AM, now a 7/10 7. RECURRENT SYMPTOM: "Have you ever had this type of stomach pain before?" If Yes, ask: "When was the last time?" and "What happened that time?"      Yes  8. CAUSE: "What do you think is causing the stomach pain?"     Poss diverticulitis  9. RELIEVING/AGGRAVATING FACTORS: "What makes it better or worse?" (e.g., antacids, bending or twisting motion, bowel movement)  Pt states she took a couple of Tylenol  but it did not help particularly 10. OTHER SYMPTOMS: "Do you have any other symptoms?" (e.g., back pain, diarrhea, fever, urination pain, vomiting)       Pt concerned this may be diverticulitis. Took a pill yesterday for nausea and pain. Denies nausea today. "Screaming" pain this morning. Pt states she has had a UTI, seen in the ED 4/9 and treated w/ Keflex . Endorses her urine is still cloudy. Denies dysuria. "I have to go to the bathroom a lot." Denies vomiting. Pt states yesterday she did not eat good and only had a very small BM. Denies recent constipation, but feels constipated now. Has not had a good BM in 2 days. Denies bloody  stools. Denies back pain. Denies fever. Hx of IBS but states that has improved since surgery. Drinking ginger ale and able to keep that down  Protocols used: Abdominal Pain - [facility]

## 2023-12-23 NOTE — Telephone Encounter (Signed)
 Noted thanks

## 2023-12-23 NOTE — Telephone Encounter (Signed)
 FYI:  Spoke to patient and she refuses Jardiance for the UTI and Yeast Infections that were mentioned.

## 2023-12-24 ENCOUNTER — Ambulatory Visit (INDEPENDENT_AMBULATORY_CARE_PROVIDER_SITE_OTHER): Admitting: Family

## 2023-12-24 ENCOUNTER — Encounter: Payer: Self-pay | Admitting: Family

## 2023-12-24 VITALS — BP 152/76 | HR 81 | Temp 98.6°F | Ht 61.0 in | Wt 114.0 lb

## 2023-12-24 DIAGNOSIS — K5909 Other constipation: Secondary | ICD-10-CM | POA: Diagnosis not present

## 2023-12-24 MED ORDER — MAGNESIUM CITRATE PO SOLN: 1.0000 | Freq: Once | ORAL | 0 refills | Status: AC

## 2023-12-24 NOTE — Telephone Encounter (Signed)
 Please schedule pt for ov to be evaluated with available provider.

## 2023-12-24 NOTE — Progress Notes (Signed)
 Patient ID: MERIEM MIELNIK, female    DOB: [birth year], 83 y.o.   MRN: [id number]  Chief Complaint  Patient presents with   Abdominal Pain    Pt c/o abdominal pain, no BM in 3 days. Has tried miralax  which did not help sx.   Discussed the use of AI scribe software for clinical note transcription with the patient, who gave verbal consent to proceed.  History of Present Illness [name] is an 83 year old female who presents with her daughter for constipation and abdominal pain.  She has experienced constipation and severe lower abdominal pain for four days, with increased pain upon sitting or standing. She remains mostly lying down due to discomfort. A small bowel movement occurred after taking magnesium  citrate, but it was ineffective in relieving pain. She has not used Miralax  or other laxatives regularly as normally she has loose stools. Her urine appears discolored, but there is no dysuria or other urinary sx. She had a urinary tract infection a few weeks ago. Current medications include Protonix , calcium  supplements, and Zofran . She drinks plenty of fluids but has reduced food intake due to discomfort. Nausea was present on the first day but resolved with medication. There is no fever or recent changes in her medication regimen.   Assessment & Plan Constipation Constipation for four days with minimal bowel movement and lower abdominal pain. Possible fecal impaction considered. One dose of magnesium  citrate taken with minimal effect. Potential contributing factors include medications such as Protonix , calcium , and Zofran  and minimal fluid intake. - RX sent for OTC magnesium  citrate, 4 ounces at a time, about 6-8 hours apart if no BM or only very small BM.  - Recommend glycerin suppositories or OTC Fleets enema for lower bowel disimpaction. - Advise maintaining hydration and avoiding gassy foods. - Can consider Lactulose of Mag citrate ineffective - Increase water intake to 2L daily -  Start daily dosing of Miralax  to help with daily BM after this episode resolved. - Consider emergency room visit if severe pain persists without bowel movement.   Subjective:     Outpatient Medications Prior to Visit  Medication Sig Dispense Refill   ALPRAZolam  (XANAX ) 0.25 MG tablet Take 1 tablet (0.25 mg total) by mouth daily as needed for anxiety. 30 tablet 0   Calcium  Carbonate-Vitamin D (CALCIUM  + D PO) Take 600 mg by mouth daily.     diltiazem  (CARDIZEM  CD) 240 MG 24 hr capsule TAKE 1 CAPSULE BY MOUTH EVERY DAY 90 capsule 3   dorzolamide -timolol  (COSOPT ) 22.3-6.8 MG/ML ophthalmic solution Place 1 drop into both eyes 2 (two) times daily.     escitalopram  (LEXAPRO ) 20 MG tablet TAKE 1 TABLET BY MOUTH EVERY DAY 90 tablet 3   melatonin 5 MG TABS Take 5 mg by mouth at bedtime as needed (Sleep).     ondansetron  (ZOFRAN -ODT) 4 MG disintegrating tablet Take 1 tablet (4 mg total) by mouth every 8 (eight) hours as needed for nausea or vomiting. 20 tablet 0   pantoprazole  (PROTONIX ) 20 MG tablet Take 20 mg by mouth daily.     rosuvastatin  (CRESTOR ) 10 MG tablet TAKE 1 TABLET (10 MG TOTAL) BY MOUTH ONCE A WEEK. 12 tablet 4   valsartan  (DIOVAN ) 320 MG tablet TAKE 1 TABLET BY MOUTH EVERY DAY 90 tablet 3   No facility-administered medications prior to visit.   Past Medical History:  Diagnosis Date   Anxiety    Arthritis    Depression    Diverticulosis of  colon 03/11/2007   Frequency of urination    GERD (gastroesophageal reflux disease) 10/14/2012   Glaucoma, both eyes    Hemorrhoids    Hiatal hernia    History of adenomatous polyp of colon    History of basal cell carcinoma (BCC) excision    History of Clostridioides difficile colitis    12/ 2022;  x2 in 2023 last one 07/ 2023   History of diverticulitis of colon    History of esophageal stricture 10/14/2012   s/p  dilatation 2014  and 01-02-2021 by dr Willy Harvest   History of gout 07/2021   flare-up bilateral feet   History of kidney  stones    Hyperlipidemia 09/22/2007   Hypertension    Irritable bowel syndrome with diarrhea    Osteopenia 02/09/2015   T score -1.4 FRAX 7.3%/0.6%   Posterior vaginal wall prolapse    PSVT (paroxysmal supraventricular tachycardia) (HCC) 08/2011   cardiologist--- dr Nunzio Belch;   first dx post op ACDF 08-14-2011   Rosacea 03/11/2007   SUI (stress urinary incontinence, female)    Type 2 diabetes, diet controlled (HCC)    followed by pcp   Uterovaginal prolapse, incomplete    Wears hearing aid in both ears    Past Surgical History:  Procedure Laterality Date   ANTERIOR AND POSTERIOR REPAIR WITH SACROSPINOUS FIXATION N/A 09/18/2022   Procedure: POSTERIOR REPAIR WITH PERINEORRHAPHY AND SACROSPINOUS FIXATION;  Surgeon: Arma Lamp, MD;  Location: [facility];  Service: Gynecology;  Laterality: N/A;  Total time requested is 1.5 hrs   ANTERIOR CERVICAL DECOMP/DISCECTOMY FUSION  05/10/2008   @[facility]  by dr Joanette Moynahan;   C4-6   ANTERIOR CERVICAL DECOMP/DISCECTOMY FUSION  08/14/2011   Procedure: ANTERIOR CERVICAL DECOMPRESSION/DISCECTOMY FUSION 1 LEVEL/HARDWARE REMOVAL;  Surgeon: Lei Pump, MD;  Location: [facility];  Service: Neurosurgery;  Laterality: N/A;  Cervical Six-Seven anterior cervical decompression with fusion interbody prothesis plating and bonegraft with removal of hardware of Cervical Four to Cervical Six    BIOPSY  09/26/2021   Procedure: BIOPSY;  Surgeon: Janel Medford, MD;  Location: [facility];  Service: Endoscopy;;   BLADDER SUSPENSION N/A 09/18/2022   Procedure: TRANSVAGINAL TAPE (TVT) PROCEDURE (POSSIBLE);  Surgeon: Arma Lamp, MD;  Location: [facility]  Service: Gynecology;  Laterality: N/A;   BUNIONECTOMY     1980s   COLONOSCOPY N/A 09/26/2021   Procedure: COLONOSCOPY;  Surgeon: Janel Medford, MD;  Location: [facility];  Service: Endoscopy;  Laterality: N/A;   CYSTOSCOPY N/A 09/18/2022   Procedure: CYSTOSCOPY;  Surgeon:  Arma Lamp, MD;  Location[facility];  Service: Gynecology;  Laterality: N/A;   ESOPHAGOGASTRODUODENOSCOPY (EGD) WITH ESOPHAGEAL DILATION  01/02/2021   dr Willy Harvest   KIDNEY SURGERY  1960   age 77-   Open kidney stone removal   KNEE ARTHROSCOPY W/ MENISCAL REPAIR Left 05/2014   TONSILLECTOMY     child   Allergies  Allergen Reactions   Codeine Nausea Only      Objective:    Physical Exam Vitals and nursing note reviewed.  Constitutional:      Appearance: Normal appearance.  Cardiovascular:     Rate and Rhythm: Normal rate and regular rhythm.  Pulmonary:     Effort: Pulmonary effort is normal.     Breath sounds: Normal breath sounds.  Abdominal:     General: There is no distension.     Tenderness: There is abdominal tenderness in the periumbilical area and left  lower quadrant. There is guarding. Negative signs include Murphy's sign and McBurney's sign.     Hernia: No hernia is present.  Musculoskeletal:        General: Normal range of motion.  Skin:    General: Skin is warm and dry.  Neurological:     Mental Status: She is alert.  Psychiatric:        Mood and Affect: Mood normal.        Behavior: Behavior normal.    BP (!) 152/76 (BP Location: Left Arm, Patient Position: Sitting, Cuff Size: Normal)   Pulse 81   Temp 98.6 F (37 C) (Temporal)   Ht 5\' 1"  (1.549 m)   Wt 114 lb (51.7 kg)   LMP 08/12/1991   SpO2 97%   BMI 21.54 kg/m  Wt Readings from Last 3 Encounters:  12/24/23 114 lb (51.7 kg)  12/15/23 112 lb 3.2 oz (50.9 kg)  11/20/23 110 lb 6 oz (50.1 kg)       Versa Gore, NP

## 2023-12-27 ENCOUNTER — Other Ambulatory Visit: Payer: Self-pay | Admitting: Family Medicine

## 2023-12-28 ENCOUNTER — Telehealth: Payer: Self-pay | Admitting: Internal Medicine

## 2023-12-28 NOTE — Telephone Encounter (Signed)
 Spoke with the pt and she wanted to get on the waitlist for sooner appt.  She will also call if she wants to check for cancellations.

## 2023-12-28 NOTE — Telephone Encounter (Signed)
 Requesting to speak with a nurse in regards to having abdominal pain and ramps. Schedule for next available with pa. Please advise.   Thank you

## 2023-12-29 ENCOUNTER — Other Ambulatory Visit: Payer: Self-pay | Admitting: Family Medicine

## 2024-01-05 ENCOUNTER — Telehealth: Payer: Self-pay | Admitting: Internal Medicine

## 2024-01-05 NOTE — Telephone Encounter (Signed)
 Patient called and stated that she really can not wait until July to be seen. Patient is requesting a call back to discuss her issue with the nurse to get a sooner appointment. Patient is scheduled to see Reginal Capra on July the 8 th. Please advise.

## 2024-01-05 NOTE — Telephone Encounter (Signed)
 Spoke with the patient. Moved appointment to 01/06/24 at 9:20 am.

## 2024-01-06 ENCOUNTER — Ambulatory Visit (INDEPENDENT_AMBULATORY_CARE_PROVIDER_SITE_OTHER): Admitting: Physician Assistant

## 2024-01-06 ENCOUNTER — Encounter: Payer: Self-pay | Admitting: Physician Assistant

## 2024-01-06 VITALS — BP 150/60 | HR 60 | Ht 59.0 in | Wt 111.5 lb

## 2024-01-06 DIAGNOSIS — K580 Irritable bowel syndrome with diarrhea: Secondary | ICD-10-CM

## 2024-01-06 DIAGNOSIS — R1030 Lower abdominal pain, unspecified: Secondary | ICD-10-CM | POA: Diagnosis not present

## 2024-01-06 MED ORDER — HYOSCYAMINE SULFATE 0.125 MG SL SUBL: 0.1250 mg | SUBLINGUAL_TABLET | Freq: Four times a day (QID) | SUBLINGUAL | 2 refills | Status: AC | PRN

## 2024-01-06 NOTE — Patient Instructions (Signed)
 We have sent the following medications to your pharmacy for you to pick up at your convenience: Hyoscyamine   _______________________________________________________  If your blood pressure at your visit was 140/90 or greater, please contact your primary care physician to follow up on this.  _______________________________________________________  If you are age 85 or older, your body mass index should be between 23-30. Your Body mass index is 22.52 kg/m. If this is out of the aforementioned range listed, please consider follow up with your Primary Care Provider.  If you are age 40 or younger, your body mass index should be between 19-25. Your Body mass index is 22.52 kg/m. If this is out of the aformentioned range listed, please consider follow up with your Primary Care Provider.   ________________________________________________________  The [facility] providers would like to encourage you to use MYCHART to communicate with providers for non-urgent requests or questions.  Due to long hold times on the telephone, sending your provider a message by [facility] may be a faster and more efficient way to get a response.  Please allow 48 business hours for a response.  Please remember that this is for non-urgent requests.  _______________________________________________________   Due to recent changes in healthcare laws, you may see the results of your imaging and laboratory studies on MyChart before your provider has had a chance to review them.  We understand that in some cases there may be results that are confusing or concerning to you. Not all laboratory results come back in the same time frame and the provider may be waiting for multiple results in order to interpret others.  Please give us  48 hours in order for your provider to thoroughly review all the results before contacting the office for clarification of your results.    I appreciate the  opportunity to care for you  Thank You    Jennifer Lemmon,PA-C

## 2024-01-06 NOTE — Progress Notes (Signed)
 Chief Complaint: Abdominal pain and cramps  Gastrointestinal problems summary:   IBS-diarrhea predominant-multiple colonoscopies without other causes, urgent fecal incontinence at times has been managed with Imodium  mostly last colonoscopy 09/30/2021 with diverticulosis, normal random biopsies   C. difficile colitis 2022- 2023-x2 treated with vancomycin  and then Dificid  July 2023   History of colonic polyps, off recall given age 83 - 2 serrated adenomas ,largest 1 cm 2008 no polyps 11/28/2011 7 mm polyp - tubular adenoma  2023 no polyps   Dysphagia-tortuous esophagus EGD 01/02/2021 suspect some dysmotility, treated with 54 French Maloney dilator.  Small hiatal hernia also.  On PPI-GERD issues   Chronic recurrent right lower quadrant pain, diverticulitis versus symptomatic diverticulosis versus IBS-often hard to distinguish clinically, she has had CT documented diverticulitis in the past    HPI:    [name] is an 83 year old female with a past medical history as listed below including reflux, depression, diverticulosis and prior diverticulitis as well as C. difficile and multiple others, known to Dr. Willy Harvest, who was referred to me by Almira Jaeger, MD for a complaint of abdominal pain and cramps.      01/02/2021 EGD done for dysphagia with tortuous esophagus, 2 cm hiatal hernia and otherwise normal.  At that time suspected some element of presbyesophagus and recommended +/- deltoids before every meal.    09/26/2021 colonoscopy done for chronic diarrhea and persistent C. difficile with diverticulosis in the entire colon and otherwise normal.  Told to start OTC Imodium  every morning.  Biopsies normal.    02/13/22 CTAP with contrast with mild diffuse wall thickening of the colon concerning for uncomplicated colitis, diffuse colonic diverticulosis without signs of acute diverticulitis, bilateral nephrolithiasis and aortic atherosclerosis.    04/22/2022 office visit with Dr. Willy Harvest.  At that  time had recovered from her second episode of C. difficile.  IBS is under good control at with the stable.  She had prolapsed internal hemorrhoids.  Also significant pelvic organ prolapse.    12/28/2023 patient called and having abdominal pain and cramps.    Today, patient presents to clinic accompanied by her friend who assists with history.  She explains that over the past 6 weeks she has been experiencing some spasms in her lower abdomen.  These are there mostly every day, all day long, rated as a 2/10, but occasionally can get so severe, rated as a 10/10 that she has to "scream out".  She has tried Advil  and Tylenol  to help but it does not really matter.  She has never had cramping like this before.  It is unrelated to bowel movements or eating.  Continues with chronic IBS-D and occasionally will use Imodium  which leaves her without a bowel movement for a couple days, but this does not change the pain above.  No nausea, vomiting or weight loss.    Her friend tells me that she is having issues with increased depression/anxiety and not really sleeping at night.    Denies fever, chills, weight loss or blood in her stool.     Past Medical History:  Diagnosis Date   Anxiety    Arthritis    Depression    Diverticulosis of colon 03/11/2007   Frequency of urination    GERD (gastroesophageal reflux disease) 10/14/2012   Glaucoma, both eyes    Hemorrhoids    Hiatal hernia    History of adenomatous polyp of colon    History of basal cell carcinoma (BCC) excision    History of Clostridioides  difficile colitis    12/ 2022;  x2 in 2023 last one 07/ 2023   History of diverticulitis of colon    History of esophageal stricture 10/14/2012   s/p  dilatation 2014  and 01-02-2021 by dr Willy Harvest   History of gout 07/2021   flare-up bilateral feet   History of kidney stones    Hyperlipidemia 09/22/2007   Hypertension    Irritable bowel syndrome with diarrhea    Osteopenia 02/09/2015   T score -1.4 FRAX  7.3%/0.6%   Posterior vaginal wall prolapse    PSVT (paroxysmal supraventricular tachycardia) (HCC) 08/2011   cardiologist--- dr Nunzio Belch;   first dx post op ACDF 08-14-2011   Rosacea 03/11/2007   SUI (stress urinary incontinence, female)    Type 2 diabetes, diet controlled (HCC)    followed by pcp   Uterovaginal prolapse, incomplete    Wears hearing aid in both ears     Past Surgical History:  Procedure Laterality Date   ANTERIOR AND POSTERIOR REPAIR WITH SACROSPINOUS FIXATION N/A 09/18/2022   Procedure: POSTERIOR REPAIR WITH PERINEORRHAPHY AND SACROSPINOUS FIXATION;  Surgeon: Arma Lamp, MD;  Location: [facility] [place];  Service: Gynecology;  Laterality: N/A;  Total time requested is 1.5 hrs   ANTERIOR CERVICAL DECOMP/DISCECTOMY FUSION  05/10/2008   @[facility]  by dr Joanette Moynahan;   C4-6   ANTERIOR CERVICAL DECOMP/DISCECTOMY FUSION  08/14/2011   Procedure: ANTERIOR CERVICAL DECOMPRESSION/DISCECTOMY FUSION 1 LEVEL/HARDWARE REMOVAL;  Surgeon: Lei Pump, MD;  Location: [facility];  Service: Neurosurgery;  Laterality: N/A;  Cervical Six-Seven anterior cervical decompression with fusion interbody prothesis plating and bonegraft with removal of hardware of Cervical Four to Cervical Six    BIOPSY  09/26/2021   Procedure: BIOPSY;  Surgeon: Janel Medford, MD;  Location: [facility];  Service: Endoscopy;;   BLADDER SUSPENSION N/A 09/18/2022   Procedure: TRANSVAGINAL TAPE (TVT) PROCEDURE (POSSIBLE);  Surgeon: Arma Lamp, MD;  Location: [facility];  Service: Gynecology;  Laterality: N/A;   BUNIONECTOMY     1980s   COLONOSCOPY N/A 09/26/2021   Procedure: COLONOSCOPY;  Surgeon: Janel Medford, MD;  Location: [facility];  Service: Endoscopy;  Laterality: N/A;   CYSTOSCOPY N/A 09/18/2022   Procedure: CYSTOSCOPY;  Surgeon: Arma Lamp, MD;  Location: [facility];  Service: Gynecology;  Laterality: N/A;   ESOPHAGOGASTRODUODENOSCOPY (EGD)  WITH ESOPHAGEAL DILATION  01/02/2021   dr Willy Harvest   KIDNEY SURGERY  1960   age 25-   Open kidney stone removal   KNEE ARTHROSCOPY W/ MENISCAL REPAIR Left 05/2014   TONSILLECTOMY     child    Current Outpatient Medications  Medication Sig Dispense Refill   ALPRAZolam  (XANAX ) 0.25 MG tablet Take 1 tablet (0.25 mg total) by mouth daily as needed for anxiety. 30 tablet 0   Calcium  Carbonate-Vitamin D (CALCIUM  + D PO) Take 600 mg by mouth daily.     diltiazem  (CARDIZEM  CD) 240 MG 24 hr capsule TAKE 1 CAPSULE BY MOUTH EVERY DAY 90 capsule 3   dorzolamide -timolol  (COSOPT ) 22.3-6.8 MG/ML ophthalmic solution Place 1 drop into both eyes 2 (two) times daily.     escitalopram  (LEXAPRO ) 20 MG tablet TAKE 1 TABLET BY MOUTH EVERY DAY 90 tablet 3   hydrochlorothiazide  (HYDRODIURIL ) 25 MG tablet TAKE 1 TABLET BY MOUTH EVERY DAY 90 tablet 3   melatonin 5 MG TABS Take 5 mg by mouth at bedtime as needed (Sleep).     ondansetron  (ZOFRAN -ODT) 4 MG disintegrating  tablet Take 1 tablet (4 mg total) by mouth every 8 (eight) hours as needed for nausea or vomiting. 20 tablet 0   pantoprazole  (PROTONIX ) 20 MG tablet TAKE 1 TABLET BY MOUTH EVERY DAY 90 tablet 3   rosuvastatin  (CRESTOR ) 10 MG tablet TAKE 1 TABLET (10 MG TOTAL) BY MOUTH ONCE A WEEK. 12 tablet 4   valsartan  (DIOVAN ) 320 MG tablet TAKE 1 TABLET BY MOUTH EVERY DAY 90 tablet 3   No current facility-administered medications for this visit.    Allergies as of 01/06/2024 - Review Complete 12/24/2023  Allergen Reaction Noted   Codeine Nausea Only 03/11/2007    Family History  Problem Relation Age of Onset   Mental illness Mother        comitted suicide   Glaucoma Mother    Lung cancer Father        smoker   Hypertension Father    Heart disease Father        age 18, smoker   Cerebral palsy Sister    Stroke Sister        multiple   Pancreatic cancer Daughter    Heart attack Son        40   Heart attack Son        57   Stroke Paternal Aunt     Stroke Paternal Aunt    Stroke Paternal Uncle    Stroke Paternal Uncle    Stroke Paternal Uncle    Diabetes Maternal Grandmother    Colon cancer Neg Hx    Stomach cancer Neg Hx    Esophageal cancer Neg Hx    Rectal cancer Neg Hx     Social History   Socioeconomic History   Marital status: Widowed    Spouse name: Not on file   Number of children: Not on file   Years of education: Not on file   Highest education level: Not on file  Occupational History   Occupation: retired    Associate Professor: RETIRED  Tobacco Use   Smoking status: Never   Smokeless tobacco: Never  Vaping Use   Vaping status: Never Used  Substance and Sexual Activity   Alcohol use: No    Alcohol/week: 0.0 standard drinks of alcohol   Drug use: Never   Sexual activity: Not Currently    Birth control/protection: Post-menopausal    Comment: 1st intercourse 83 yo-Fewer than 5 partners  Other Topics Concern   Not on file  Social History Narrative   Lives with son starting in 2024. Widowed. 2 living children (lost son to MI 2019 and daughter to pancreatic cancer 2020, another son MI 2021) and 7 grandchildren. 3 greatgrandkids      Retired from [employer] after 5 years and later took care of children.       Hobbies: time with grandkids, shopping, go out to eat. Travels once a year with girlfriends.    Social Drivers of Corporate investment banker Strain: Low Risk  (06/03/2022)   Overall Financial Resource Strain (CARDIA)    Difficulty of Paying Living Expenses: Not hard at all  Food Insecurity: No Food Insecurity (06/03/2022)   Hunger Vital Sign    Worried About Running Out of Food in the Last Year: Never true    Ran Out of Food in the Last Year: Never true  Transportation Needs: No Transportation Needs (06/03/2022)   PRAPARE - Administrator, Civil Service (Medical): No    Lack of Transportation (Non-Medical): No  Physical  Activity: Inactive (06/03/2022)   Exercise Vital Sign    Days of Exercise per  Week: 0 days    Minutes of Exercise per Session: 0 min  Stress: No Stress Concern Present (06/03/2022)   [employer] of Occupational Health - Occupational Stress Questionnaire    Feeling of Stress : Only a little  Social Connections: Moderately Integrated (06/03/2022)   Social Connection and Isolation Panel [NHANES]    Frequency of Communication with Friends and Family: More than three times a week    Frequency of Social Gatherings with Friends and Family: More than three times a week    Attends Religious Services: More than 4 times per year    Active Member of [employer] or Organizations: Yes    Attends Banker Meetings: 1 to 4 times per year    Marital Status: Widowed  Intimate Partner Violence: Not At Risk (06/03/2022)   Humiliation, Afraid, Rape, and Kick questionnaire    Fear of Current or Ex-Partner: No    Emotionally Abused: No    Physically Abused: No    Sexually Abused: No    Review of Systems:    Constitutional: No weight loss, fever or chills Cardiovascular: No chest pain Respiratory: No SOB Gastrointestinal: See HPI and otherwise negative   Physical Exam:  Vital signs: BP (!) 150/60 (BP Location: Left Arm, Patient Position: Sitting, Cuff Size: Normal)   Pulse 60   Ht 4\' 11"  (1.499 m) Comment: height measured without shoes  Wt 111 lb 8 oz (50.6 kg)   LMP 08/12/1991   BMI 22.52 kg/m    Constitutional:   Pleasant elderly Caucasian female appears to be in NAD, Well developed, Well nourished, alert and cooperative Respiratory: Respirations even and unlabored. Lungs clear to auscultation bilaterally.   No wheezes, crackles, or rhonchi.  Cardiovascular: Normal S1, S2. No MRG. Regular rate and rhythm. No peripheral edema, cyanosis or pallor.  Gastrointestinal:  Soft, nondistended, mild lower abdominal TTP. No rebound or guarding. Normal bowel sounds. No appreciable masses or hepatomegaly. Rectal:  Not performed.  Psychiatric: Demonstrates good judgement and  reason without abnormal affect or behaviors.  RELEVANT LABS AND IMAGING: CBC    Component Value Date/Time   WBC 8.5 12/21/2023 1052   RBC 3.94 12/21/2023 1052   HGB 12.2 12/21/2023 1052   HCT 37.1 12/21/2023 1052   PLT 324.0 12/21/2023 1052   MCV 94.2 12/21/2023 1052   MCH 31.2 11/18/2023 1134   MCHC 32.9 12/21/2023 1052   RDW 13.2 12/21/2023 1052   LYMPHSABS 2.4 12/21/2023 1052   MONOABS 0.6 12/21/2023 1052   EOSABS 0.1 12/21/2023 1052   BASOSABS 0.0 12/21/2023 1052    CMP     Component Value Date/Time   NA 142 12/21/2023 1052   K 3.6 12/21/2023 1052   CL 103 12/21/2023 1052   CO2 29 12/21/2023 1052   GLUCOSE 125 (H) 12/21/2023 1052   BUN 16 12/21/2023 1052   CREATININE 0.64 12/21/2023 1052   CREATININE 0.72 07/25/2020 1215   CALCIUM  9.0 12/21/2023 1052   PROT 7.1 12/21/2023 1052   ALBUMIN 4.1 12/21/2023 1052   AST 14 12/21/2023 1052   ALT 13 12/21/2023 1052   ALKPHOS 63 12/21/2023 1052   BILITOT 0.6 12/21/2023 1052   GFRNONAA 54 (L) 11/18/2023 1134   GFRNONAA 79 07/25/2020 1215   GFRAA 91 07/25/2020 1215    Assessment: 1.  Lower abdominal pain: Over the past 6 weeks or so, cramping pain across her lower abdomen, sometimes more  severe than others, unrelated to a bowel movement, no changes in urine; likely IBS-D versus other 2.  IBS-D: Multiple evaluations in the past, currently doing well on Imodium  as needed  Plan: 1.  Discussed with patient that her lower abdominal cramping could be a result of her increased stress and anxiety in the setting of IBS-D. 2.  Will trial Hyoscyamine sulfate 0.125 mg sublingual tabs 1 tab every 6-8 hours as needed for cramping.  #30 with 2 refills.  Discussed that this medication can have a tendency to dry her out, so would prefer she use it just as needed and as minimally as possible.  If it does help with the pain then we can relax and likely it is related to IBS.  If it does not help over the next couple of weeks then would recommend  a CT of the abdomen pelvis with contrast for further evaluation. 3.  If proceeding with CT would likely need updated labs. 4.  Patient can continue as needed Imodium  for her chronic diarrhea which seems to well core for her. 5.  Patient is thinking about going to a therapist, encouraged her to do so.  Discussed pathophysiology of IBS. 6.  Patient to follow in clinic with me in 2 to 3 months or sooner if necessary.  Reginal Capra, PA-C [facility] 01/06/2024, 9:27 AM  Cc: Almira Jaeger, MD

## 2024-01-26 ENCOUNTER — Telehealth: Payer: Self-pay

## 2024-01-26 NOTE — Telephone Encounter (Signed)
 FYI.  Copied from CRM [id number]. Topic: Clinical - Medical Advice >> Jan 26, 2024 12:45 PM [name] wrote: Reason for CRM: Patient is calling in stating that she is still having leg pain, to the point she can barley walk without pain, and is waking her up in her sleep.

## 2024-01-26 NOTE — Telephone Encounter (Signed)
 She has a visit scheduled in 2 days-I do not recall her having daytime leg pain-lets try to get her in ASAP and if we have any openings see her tomorrow or advise her if worsening symptoms to seek care in emergency department

## 2024-01-27 NOTE — Telephone Encounter (Signed)
 Please schedule with available provider if any openings today.

## 2024-01-28 ENCOUNTER — Encounter: Payer: Self-pay | Admitting: Family Medicine

## 2024-01-28 ENCOUNTER — Ambulatory Visit (INDEPENDENT_AMBULATORY_CARE_PROVIDER_SITE_OTHER): Admitting: Family Medicine

## 2024-01-28 VITALS — BP 134/76 | HR 74 | Temp 97.9°F | Resp 18 | Ht 59.0 in | Wt 111.1 lb

## 2024-01-28 DIAGNOSIS — M79605 Pain in left leg: Secondary | ICD-10-CM

## 2024-01-28 DIAGNOSIS — R29898 Other symptoms and signs involving the musculoskeletal system: Secondary | ICD-10-CM

## 2024-01-28 DIAGNOSIS — E1169 Type 2 diabetes mellitus with other specified complication: Secondary | ICD-10-CM | POA: Diagnosis not present

## 2024-01-28 DIAGNOSIS — M79604 Pain in right leg: Secondary | ICD-10-CM

## 2024-01-28 DIAGNOSIS — I10 Essential (primary) hypertension: Secondary | ICD-10-CM | POA: Diagnosis not present

## 2024-01-28 DIAGNOSIS — E785 Hyperlipidemia, unspecified: Secondary | ICD-10-CM

## 2024-01-28 NOTE — Patient Instructions (Addendum)
 Hold rosuvastatin  until we get MRI at least and lets see if that helps with the pain  I am going to try to get an MRI We will call you within two weeks about your referral  through [facility].  Their phone number is [phone].  Please call them if you have not heard in 1-2 weeks  Please stop by lab before you go If you have mychart- we will send your results within 3 business days of us  receiving them.  If you do not have mychart- we will call you about results within 5 business days of us  receiving them.  *please also note that you will see labs on mychart as soon as they post. I will later go in and write notes on them- will say notes from Dr. Arlene Ben    Recommended follow up: Return for next already scheduled visit or sooner if needed.

## 2024-01-28 NOTE — Progress Notes (Signed)
 Phone [phone] In person visit   Subjective:   [name] is a 83 y.o. year old very pleasant female patient who presents for/with See problem oriented charting Chief Complaint  Patient presents with   Leg Pain    Bilateral leg pain, off and on that started a couple of months ago Seen previously for leg pain    Past Medical History-  Patient Active Problem List   Diagnosis Date Noted   Diabetes mellitus without complication (HCC) 03/23/2019    Priority: High   History of supraventricular tachycardia 08/14/2011    Priority: High   Osteopenia of left femoral neck 08/16/2021    Priority: Medium    Aortic atherosclerosis (HCC) 07/21/2019    Priority: Medium    GERD (gastroesophageal reflux disease) 07/14/2014    Priority: Medium    Primary open angle glaucoma of both eyes, mild stage 05/29/2014    Priority: Medium    IBS (irritable bowel syndrome)- diarrhea predominant 01/26/2013    Priority: Medium    Glaucoma associated with ocular disorder 11/05/2009    Priority: Medium    Hyperlipidemia associated with type 2 diabetes mellitus (HCC) 09/22/2007    Priority: Medium    Major depression in full remission (HCC)- Lexapro  20mg . Xanax  once a week or less. 03/11/2007    Priority: Medium    Essential hypertension 03/11/2007    Priority: Medium    Sensorineural hearing loss (SNHL), bilateral 04/06/2020    Priority: Low   History of colonic polyps 11/28/2011    Priority: Low   Senile nuclear sclerosis 06/29/2011    Priority: Low   Cervical disc disorder with radiculopathy of cervical region 11/05/2009    Priority: Low   Rosacea 03/11/2007    Priority: Low   SVT (supraventricular tachycardia) (HCC) 08/16/2021   Hypokalemia 07/21/2021   C. difficile diarrhea 07/20/2021   Vertigo 04/06/2020   Knee pain, left 11/02/2013    Medications- reviewed and updated Current Outpatient Medications  Medication Sig Dispense Refill   ALPRAZolam  (XANAX ) 0.25 MG tablet Take 1  tablet (0.25 mg total) by mouth daily as needed for anxiety. 30 tablet 0   Calcium  Carbonate-Vitamin D (CALCIUM  + D PO) Take 600 mg by mouth daily.     diltiazem  (CARDIZEM  CD) 240 MG 24 hr capsule TAKE 1 CAPSULE BY MOUTH EVERY DAY 90 capsule 3   dorzolamide -timolol  (COSOPT ) 22.3-6.8 MG/ML ophthalmic solution Place 1 drop into both eyes 2 (two) times daily.     escitalopram  (LEXAPRO ) 20 MG tablet TAKE 1 TABLET BY MOUTH EVERY DAY 90 tablet 3   hyoscyamine  (LEVSIN  SL) 0.125 MG SL tablet Place 1 tablet (0.125 mg total) under the tongue every 6 (six) hours as needed. 30 tablet 2   melatonin 5 MG TABS Take 5 mg by mouth at bedtime as needed (Sleep).     ondansetron  (ZOFRAN -ODT) 4 MG disintegrating tablet Take 1 tablet (4 mg total) by mouth every 8 (eight) hours as needed for nausea or vomiting. 20 tablet 0   pantoprazole  (PROTONIX ) 20 MG tablet TAKE 1 TABLET BY MOUTH EVERY DAY 90 tablet 3   rosuvastatin  (CRESTOR ) 10 MG tablet TAKE 1 TABLET (10 MG TOTAL) BY MOUTH ONCE A WEEK. 12 tablet 4   valsartan  (DIOVAN ) 320 MG tablet TAKE 1 TABLET BY MOUTH EVERY DAY 90 tablet 3   No current facility-administered medications for this visit.     Objective:  BP 134/76   Pulse 74   Temp 97.9 F (36.6 C) (Temporal)  Resp 18   Ht 4' 11 (1.499 m)   Wt 111 lb 2 oz (50.4 kg)   LMP 08/12/1991   SpO2 95%   BMI 22.44 kg/m  Gen: NAD, resting comfortably CV: RRR no murmurs rubs or gallops Lungs: CTAB no crackles, wheeze, rhonchi Ext: no edema Skin: warm, dry MSK: Reports pain with palpation over bilateral thighs Neuro: 4+ out of 5 strength with hip flexion, 5-/5 strength in lower leg extension Musculoskeletal: no midline back pain but some Paraspinous muscle tenderness noted     Assessment and Plan   # Bilateral leg pain in patient with known history of disc disease of lumbar spine based on most recent hip films S: Patient reports off-and-on bilateral leg pain that started a couple of months ago. -At last  visit in May she reported waking up in middle night with cramping pain in her legs and using a cream IcyHot that was very helpful and would help her get back to bed.  This would happen 3-4 times a week or if she would wake up with this pain.  She can fall asleep without difficulty and no daytime symptoms reported at last visit.  Was not corresponding to the days that she was taking rosuvastatin .   Today reports increased frequency of leg cramping. Happens through the entire thigh but not into calves. Can grab her just sitting still. No clear triggers. Can feel an ache almost all the time at least 75% of the time and other times gets more severe cramping. 3 nights a week waking her from sleep.  - also experiencing pain in her low back for months if active but doesn't always line up with the back pain.  -no fecal or urinary incontinence. Sometimes legs can feel weak. No saddle anesthesia.  - no falls or injuries -does worsen with walking. If she rests it gets better usually.  - tylenol  does help some and 2 before bed -pain can go into buttocks A/P: 83 year old woman with worsening bilateral leg pain and at times with severe cramping pain now noting bilateral buttocks pain as well as leg weakness into both legs worse with walking and better with rest-I am concerned for spinal stenosis - I also wonder if statin could be contributing- hold statin -2+ pulses bilaterally DP and PT so doubt arterial -also has some back pain-I see evidence of degenerative disc disease on most recent hip films - I am going to also recheck ESR and CRP- previously checked for temporal arteritis concern but headache(s) resolved  but now with leg pain and weakness make sure not higher for concern of polymyalgia rheumatica    #hyperlipidemia associated with diabetes/aortic atherosclerosis- incidental finding on prior imaging  S:  rosuvastatin  10 mg weekly A/P: Check CK for muscle breakdown.  She took her medicine today but we  decided to hold until get the MRI back  #hypertension/history of SVT- sees cardiology #history of hypokalemia with C. diff S: medication(s): Cardizem  240mg  ER, valsartan  320 mg -prior hypokalemia- taken off of hydrochlorothiazide  25 mg  BP Readings from Last 3 Encounters:  01/28/24 134/76  01/06/24 (!) 150/60  12/24/23 (!) 152/76  A/P: Blood pressure improved today-continue current medication  Recommended follow up: Return for next already scheduled visit or sooner if needed. Future Appointments  Date Time Provider Department Center  03/22/2024  2:00 PM Almira Jaeger, MD [facility]  03/30/2024  8:20 AM Alverta Johns Kathy Parker, PA [facility]    Lab/Order associations:   ICD-10-CM  1. Bilateral leg pain  M79.604 Comprehensive metabolic panel with GFR   M79.605 CBC with Differential/Platelet    CK (Creatine Kinase)    Sedimentation rate    C-reactive protein    MR LUMBAR SPINE WO CONTRAST    2. Bilateral leg weakness  R29.898 MR LUMBAR SPINE WO CONTRAST    3. Essential hypertension  I10     4. Hyperlipidemia associated with type 2 diabetes mellitus (HCC)  E11.69    E78.5       No orders of the defined types were placed in this encounter.   Return precautions advised.  Clarisa Crooked, MD

## 2024-01-29 ENCOUNTER — Ambulatory Visit: Payer: Self-pay | Admitting: Family Medicine

## 2024-01-29 LAB — COMPREHENSIVE METABOLIC PANEL WITH GFR
ALT: 12 U/L (ref 0–35)
AST: 16 U/L (ref 0–37)
Albumin: 4 g/dL (ref 3.5–5.2)
Alkaline Phosphatase: 57 U/L (ref 39–117)
BUN: 16 mg/dL (ref 6–23)
CO2: 31 meq/L (ref 19–32)
Calcium: 9.3 mg/dL (ref 8.4–10.5)
Chloride: 103 meq/L (ref 96–112)
Creatinine, Ser: 0.71 mg/dL (ref 0.40–1.20)
GFR: 77.72 mL/min (ref >60.00)
Glucose, Bld: 90 mg/dL (ref 70–99)
Potassium: 4 meq/L (ref 3.5–5.1)
Sodium: 142 meq/L (ref 135–145)
Total Bilirubin: 0.4 mg/dL (ref 0.2–1.2)
Total Protein: 6.8 g/dL (ref 6.0–8.3)

## 2024-01-29 LAB — CBC WITH DIFFERENTIAL/PLATELET
Basophils Absolute: 0.1 10*3/uL (ref 0.0–0.1)
Basophils Relative: 1 % (ref 0.0–3.0)
Eosinophils Absolute: 0.2 10*3/uL (ref 0.0–0.7)
Eosinophils Relative: 2.1 % (ref 0.0–5.0)
HCT: 35 % — ABNORMAL LOW (ref 36.0–46.0)
Hemoglobin: 11.6 g/dL — ABNORMAL LOW (ref 12.0–15.0)
Lymphocytes Relative: 34.6 % (ref 12.0–46.0)
Lymphs Abs: 2.4 10*3/uL (ref 0.7–4.0)
MCHC: 33.3 g/dL (ref 30.0–36.0)
MCV: 91 fl (ref 78.0–100.0)
Monocytes Absolute: 0.5 10*3/uL (ref 0.1–1.0)
Monocytes Relative: 7.4 % (ref 3.0–12.0)
Neutro Abs: 3.9 10*3/uL (ref 1.4–7.7)
Neutrophils Relative %: 54.9 % (ref 43.0–77.0)
Platelets: 334 10*3/uL (ref 150.0–400.0)
RBC: 3.84 Mil/uL — ABNORMAL LOW (ref 3.87–5.11)
RDW: 13.7 % (ref 11.5–15.5)
WBC: 7 10*3/uL (ref 4.0–10.5)

## 2024-01-29 LAB — SEDIMENTATION RATE: Sed Rate: 27 mm/h (ref 0–30)

## 2024-01-29 LAB — CK: Total CK: 73 U/L (ref 7–177)

## 2024-01-29 LAB — C-REACTIVE PROTEIN: CRP: <1 mg/dL (ref 0.5–20.0)

## 2024-02-16 ENCOUNTER — Ambulatory Visit: Admitting: Physician Assistant

## 2024-02-18 ENCOUNTER — Other Ambulatory Visit: Payer: Self-pay | Admitting: Family Medicine

## 2024-03-22 ENCOUNTER — Ambulatory Visit: Admitting: Family Medicine

## 2024-03-22 ENCOUNTER — Encounter: Payer: Self-pay | Admitting: Family Medicine

## 2024-03-22 VITALS — BP 132/68 | HR 77 | Temp 97.9°F | Ht 59.0 in | Wt 110.8 lb

## 2024-03-22 DIAGNOSIS — M79604 Pain in right leg: Secondary | ICD-10-CM | POA: Diagnosis not present

## 2024-03-22 DIAGNOSIS — E1169 Type 2 diabetes mellitus with other specified complication: Secondary | ICD-10-CM | POA: Diagnosis not present

## 2024-03-22 DIAGNOSIS — E1129 Type 2 diabetes mellitus with other diabetic kidney complication: Secondary | ICD-10-CM | POA: Diagnosis not present

## 2024-03-22 DIAGNOSIS — M79605 Pain in left leg: Secondary | ICD-10-CM

## 2024-03-22 DIAGNOSIS — I10 Essential (primary) hypertension: Secondary | ICD-10-CM

## 2024-03-22 DIAGNOSIS — R809 Proteinuria, unspecified: Secondary | ICD-10-CM

## 2024-03-22 DIAGNOSIS — E785 Hyperlipidemia, unspecified: Secondary | ICD-10-CM

## 2024-03-22 NOTE — Patient Instructions (Addendum)
 Health Maintenance Due  Topic Date Due   Medicare Annual Wellness (AWV)  06/04/2023  You are eligible to schedule your annual wellness visit with our nurse specialist [place].  Please consider scheduling this before you leave today  since coming off rosuvastatin  didn't help leg pain or weakness lets restart once a week   I advised her with fall to avoid alprazolam - only uses exceedingly sparingly if she has good support from someone close to her of the monitor for falls . Anymore falls we need to stop medicine completely  Recommended follow up: Return in about 6 months (around 09/22/2024) for followup or sooner if needed.Schedule b4 you leave.

## 2024-03-22 NOTE — Progress Notes (Signed)
 Phone [phone] In person visit   Subjective:   [name] is a 83 y.o. year old very pleasant female patient who presents for/with See problem oriented charting Chief Complaint  Patient presents with   Diabetes   Hypertension   Bilateral Leg Pain   Medical Management of Chronic Issues    Pt has concerns about some skin issues; one on face, one on chest    Past Medical History-  Patient Active Problem List   Diagnosis Date Noted   Diabetes mellitus without complication (HCC) 03/23/2019    Priority: High   History of supraventricular tachycardia 08/14/2011    Priority: High   Osteopenia of left femoral neck 08/16/2021    Priority: Medium    Aortic atherosclerosis (HCC) 07/21/2019    Priority: Medium    GERD (gastroesophageal reflux disease) 07/14/2014    Priority: Medium    Primary open angle glaucoma of both eyes, mild stage 05/29/2014    Priority: Medium    IBS (irritable bowel syndrome)- diarrhea predominant 01/26/2013    Priority: Medium    Glaucoma associated with ocular disorder 11/05/2009    Priority: Medium    Hyperlipidemia associated with type 2 diabetes mellitus (HCC) 09/22/2007    Priority: Medium    Major depression in full remission (HCC)- Lexapro  20mg . Xanax  once a week or less. 03/11/2007    Priority: Medium    Essential hypertension 03/11/2007    Priority: Medium    Sensorineural hearing loss (SNHL), bilateral 04/06/2020    Priority: Low   History of colonic polyps 11/28/2011    Priority: Low   Senile nuclear sclerosis 06/29/2011    Priority: Low   Cervical disc disorder with radiculopathy of cervical region 11/05/2009    Priority: Low   Rosacea 03/11/2007    Priority: Low   SVT (supraventricular tachycardia) (HCC) 08/16/2021   Hypokalemia 07/21/2021   C. difficile diarrhea 07/20/2021   Vertigo 04/06/2020   Knee pain, left 11/02/2013    Medications- reviewed and updated Current Outpatient Medications  Medication Sig Dispense Refill    ALPRAZolam  (XANAX ) 0.25 MG tablet Take 1 tablet (0.25 mg total) by mouth daily as needed for anxiety. 30 tablet 0   Calcium  Carbonate-Vitamin D (CALCIUM  + D PO) Take 600 mg by mouth daily.     diltiazem  (CARDIZEM  CD) 240 MG 24 hr capsule TAKE 1 CAPSULE BY MOUTH EVERY DAY 90 capsule 3   dorzolamide -timolol  (COSOPT ) 22.3-6.8 MG/ML ophthalmic solution Place 1 drop into both eyes 2 (two) times daily.     escitalopram  (LEXAPRO ) 20 MG tablet TAKE 1 TABLET BY MOUTH EVERY DAY 90 tablet 3   hyoscyamine  (LEVSIN  SL) 0.125 MG SL tablet Place 1 tablet (0.125 mg total) under the tongue every 6 (six) hours as needed. 30 tablet 2   melatonin 5 MG TABS Take 5 mg by mouth at bedtime as needed (Sleep).     ondansetron  (ZOFRAN -ODT) 4 MG disintegrating tablet Take 1 tablet (4 mg total) by mouth every 8 (eight) hours as needed for nausea or vomiting. 20 tablet 0   pantoprazole  (PROTONIX ) 20 MG tablet TAKE 1 TABLET BY MOUTH EVERY DAY 90 tablet 3   rosuvastatin  (CRESTOR ) 10 MG tablet TAKE 1 TABLET (10 MG TOTAL) BY MOUTH ONCE A WEEK. 12 tablet 4   valsartan  (DIOVAN ) 320 MG tablet TAKE 1 TABLET BY MOUTH EVERY DAY 90 tablet 3   No current facility-administered medications for this visit.     Objective:  BP 132/68 (BP Location: Left Arm, Patient  Position: Sitting, Cuff Size: Normal)   Pulse 77   Temp 97.9 F (36.6 C) (Temporal)   Ht 4' 11 (1.499 m)   Wt 110 lb 12.8 oz (50.3 kg)   LMP 08/12/1991   SpO2 96%   BMI 22.38 kg/m  Gen: NAD, resting comfortably CV: RRR no murmurs rubs or gallops Lungs: CTAB no crackles, wheeze, rhonchi Abdomen: soft/nontender/nondistended/normal bowel sounds. No rebound or guarding.  Ext: no edema Skin: warm, dry, warty stuck on lesion left cheek less than 5 mm- likely SK, on right chest almost 1 cm lesion with some irregular borders and a circular darker lesion on right forearm- referring back to dermatology     Assessment and Plan   # Skin lesions-patient has an area on her  face and on her chest and right forearm (not growing or changing)- looks like SK on face- on right chest and right forearm- want her to see Dr. Lynnell to evaluation for melanoma- has darker spot   # Recent fall-early July patient had a fall and was seen in emergency department after mechanical fall-struck the right side of her face/head on concrete and had a small laceration and bruising near the right eye as well as large laceration on the right hand requiring 15 sutures.  She saw optometry afterwards as well as orthopedic surgery/hand surgery. She has healed well. No vision issues.  -apparently tripped on something sticking up from cement and tripped her up. No loc  # Bilateral leg pain and weakness-known history of disc disease of lumbar spine-at last visit was having severe crampy pain as well as bilateral buttocks pain as well as leg weakness and I was concerned about spinal stenosis.  I had her hold her statin short-term.  Had excellent pulses.  Ordered MRI-this has not yet been completed.  ESR and CRP were not elevated to evaluate for polymyalgia rheumatica -pain and weakness come and go.  She is now opting out of the MRI of low back and brain for now   # Diabetes- diagnosed in 2020 with mild microalbuminuria at 57 with correction 12/10/2022 later worsened to 226 May 2025  S: Diet controlled.   -Valsartan  320 mg for microalbuminuria, declines Jardiance concern for UTI risk Lab Results  Component Value Date   HGBA1C 6.5 12/21/2023   HGBA1C 6.6 (H) 06/15/2023   HGBA1C 6.7 (H) 12/10/2022   A/P: diabetes well controlled continue without medicine Microalbuminuria noted and worsening last visit- recheck today- she feels if worsening might be more interested in adding jardiance   #hyperlipidemia associated with diabetes/aortic atherosclerosis- incidental finding on prior imaging  S: prior rosuvastatin  10 mg weekly- trialed off but no improvement in leg pain or weakness Lab Results  Component  Value Date   CHOL 159 12/21/2023   HDL 35.80 (L) 12/21/2023   LDLCALC 80 12/21/2023   LDLDIRECT 95.0 04/18/2021   TRIG 218.0 (H) 12/21/2023   CHOLHDL 4 12/21/2023  A/P: since coming off rosuvastatin  didn't help leg pain or weakness lets restart once a week  #hypertension/history of SVT- sees cardiology #history of hypokalemia with C. diff S: medication(s): Cardizem  240mg  ER, valsartan  320 mg -prior hypokalemia- taken off of hydrochlorothiazide  25 mg  No obvious recurrence of SVT on Cardizem . BP Readings from Last 3 Encounters:  03/22/24 132/68  01/28/24 134/76  01/06/24 (!) 150/60  A/P: well controlled continue current medications for hypertension  -most recent potassium ok- hold off  #Depression/anxiety- lost son to MI 2019 and daughter to pancreatic cancer 2020 and  another son to MI 2021 then granddaughter lost husband in [facility] 2021 S: Patient compliant with Lexapro  20 mg.  Patient takes sparing alprazolam - may have used morning of fall A/P: depression controlled/full remission- I advised her with fall to avoid alprazolam - only uses exceedingly sparingly if she has good support from someone close to her of the monitor for falls   # Abdominal cramping/IBS-on hyoscyamine  through gastroenterology- some help   Recommended follow up: Return in about 6 months (around 09/22/2024) for followup or sooner if needed.Schedule b4 you leave. Future Appointments  Date Time Provider Department Center  03/30/2024  8:20 AM Beather Delon Gibson, [place] [facility]   Lab/Order associations:   ICD-10-CM   1. Diabetes mellitus with microalbuminuria (HCC)  E11.29 Microalbumin / creatinine urine ratio   R80.9     2. Essential hypertension  I10     3. Hyperlipidemia associated with type 2 diabetes mellitus (HCC)  E11.69    E78.5     4. Bilateral leg pain  M79.604    M79.605      No orders of the defined types were placed in this encounter.  Return precautions advised.  Garnette Lukes,  MD

## 2024-03-27 ENCOUNTER — Other Ambulatory Visit: Payer: Self-pay | Admitting: Family Medicine

## 2024-03-28 NOTE — Addendum Note (Signed)
 Addended by: KATRINKA GARNETTE KIDD on: 03/28/2024 12:29 PM   Modules accepted: Orders

## 2024-03-30 ENCOUNTER — Ambulatory Visit: Admitting: Physician Assistant

## 2024-04-08 ENCOUNTER — Ambulatory Visit
Admission: RE | Admit: 2024-04-08 | Discharge: 2024-04-08 | Disposition: A | Discharge status: Home / Self Care | Source: Ambulatory Visit | Attending: Family Medicine | Admitting: Family Medicine

## 2024-04-08 DIAGNOSIS — M79604 Pain in right leg: Secondary | ICD-10-CM

## 2024-04-08 DIAGNOSIS — R29898 Other symptoms and signs involving the musculoskeletal system: Secondary | ICD-10-CM

## 2024-04-18 ENCOUNTER — Emergency Department (HOSPITAL_BASED_OUTPATIENT_CLINIC_OR_DEPARTMENT_OTHER)

## 2024-04-18 ENCOUNTER — Other Ambulatory Visit: Payer: Self-pay

## 2024-04-18 ENCOUNTER — Encounter (HOSPITAL_BASED_OUTPATIENT_CLINIC_OR_DEPARTMENT_OTHER): Payer: Self-pay | Admitting: Emergency Medicine

## 2024-04-18 ENCOUNTER — Emergency Department (HOSPITAL_BASED_OUTPATIENT_CLINIC_OR_DEPARTMENT_OTHER)
Admission: EM | Admit: 2024-04-18 | Discharge: 2024-04-18 | Disposition: A | Discharge status: Home / Self Care | Attending: Emergency Medicine | Admitting: Emergency Medicine

## 2024-04-18 DIAGNOSIS — E119 Type 2 diabetes mellitus without complications: Secondary | ICD-10-CM | POA: Diagnosis not present

## 2024-04-18 DIAGNOSIS — M25561 Pain in right knee: Secondary | ICD-10-CM | POA: Insufficient documentation

## 2024-04-18 DIAGNOSIS — S0083XA Contusion of other part of head, initial encounter: Secondary | ICD-10-CM | POA: Diagnosis not present

## 2024-04-18 DIAGNOSIS — W010XXA Fall on same level from slipping, tripping and stumbling without subsequent striking against object, initial encounter: Secondary | ICD-10-CM | POA: Diagnosis not present

## 2024-04-18 DIAGNOSIS — I10 Essential (primary) hypertension: Secondary | ICD-10-CM | POA: Insufficient documentation

## 2024-04-18 DIAGNOSIS — S0993XA Unspecified injury of face, initial encounter: Secondary | ICD-10-CM | POA: Diagnosis present

## 2024-04-18 DIAGNOSIS — W19XXXA Unspecified fall, initial encounter: Secondary | ICD-10-CM

## 2024-04-18 NOTE — Discharge Instructions (Signed)
 May take Tylenol  as needed for pain.  Please follow closely with your primary care doctor in the coming week.  Return with any new or suddenly worsening symptoms.

## 2024-04-18 NOTE — ED Provider Notes (Signed)
 Emergency Department Provider Note   I have reviewed the triage vital signs and the nursing notes.   HISTORY  Chief Complaint Fall   HPI [name] is a 83 y.o. female presents to the emergency department for evaluation of pain after a fall.  Patient states that she thinks she was moving too quickly and lost her balance causing her to fall face down onto concrete.  She has some bruising to the right forehead and temple with headache.  No pain with extraocular movement or vision change although does have some swelling just above the right eye.  She also has some mild swelling to the right anterior knee.  She called for help and family came to assist her in getting up.  She presents to the ED for evaluation. She has been ambulatory since.    Past Medical History:  Diagnosis Date   Anxiety    Arthritis    Depression    Diverticulosis of colon 03/11/2007   Frequency of urination    GERD (gastroesophageal reflux disease) 10/14/2012   Glaucoma, both eyes    Hemorrhoids    Hiatal hernia    History of adenomatous polyp of colon    History of basal cell carcinoma (BCC) excision    History of Clostridioides difficile colitis    12/ 2022;  x2 in 2023 last one 07/ 2023   History of diverticulitis of colon    History of esophageal stricture 10/14/2012   s/p  dilatation 2014  and 01-02-2021 by dr avram   History of gout 07/2021   flare-up bilateral feet   History of kidney stones    Hyperlipidemia 09/22/2007   Hypertension    Irritable bowel syndrome with diarrhea    Osteopenia 02/09/2015   T score -1.4 FRAX 7.3%/0.6%   Posterior vaginal wall prolapse    PSVT (paroxysmal supraventricular tachycardia) (HCC) 08/2011   cardiologist--- dr kelsie;   first dx post op ACDF 08-14-2011   Rosacea 03/11/2007   SUI (stress urinary incontinence, female)    Type 2 diabetes, diet controlled (HCC)    followed by pcp   Uterovaginal prolapse, incomplete    Wears hearing aid in both ears      Review of Systems  Constitutional: No fever/chills Cardiovascular: Denies chest pain. Respiratory: Denies shortness of breath. Gastrointestinal: No abdominal pain.  No nausea, no vomiting.   Musculoskeletal: Negative for back pain. Positive right knee pain.  Skin: Negative for rash. Positive bruising over the right eye.  Neurological: Positive HA.  ____________________________________________   PHYSICAL EXAM:  VITAL SIGNS: ED Triage Vitals  Encounter Vitals Group     BP 04/18/24 1215 (!) 177/73     Pulse Rate 04/18/24 1215 61     Resp 04/18/24 1215 17     Temp 04/18/24 1215 98 F (36.7 C)     Temp src --      SpO2 04/18/24 1215 98 %     Weight 04/18/24 1217 110 lb (49.9 kg)     Height 04/18/24 1217 4' 11 (1.499 m)   Constitutional: Alert and oriented. Well appearing and in no acute distress. Eyes: Conjunctivae are normal.  Slight periorbital ecchymosis to the right superior/lateral eye.  Normal extraocular movement. PERRL. Head: Atraumatic. Nose: No congestion/rhinnorhea. Mouth/Throat: Mucous membranes are moist.  Neck: No stridor.   Cardiovascular: Normal rate, regular rhythm. Good peripheral circulation. Grossly normal heart sounds.   Respiratory: Normal respiratory effort.  No retractions. Lungs CTAB. Gastrointestinal: Soft and nontender. No distention.  Musculoskeletal: Slight anterior right knee swelling. No laceration. Normal ROM of the right hip.  Neurologic:  Normal speech and language. No gross focal neurologic deficits are appreciated.  Skin:  Skin is warm, dry and intact. No rash noted.  ____________________________________________  RADIOLOGY  No results found.  ____________________________________________   PROCEDURES  Procedure(s) performed:   Procedures  None  ____________________________________________   INITIAL IMPRESSION / ASSESSMENT AND PLAN / ED COURSE  Pertinent labs & imaging results that were available during my care of the  patient were reviewed by me and considered in my medical decision making (see chart for details).   This patient is Presenting for Evaluation of head injury/fall, which does require a range of treatment options, and is a complaint that involves a high risk of morbidity and mortality.  The Differential Diagnoses includes subdural hematoma, epidural hematoma, acute concussion, traumatic subarachnoid hemorrhage, cerebral contusions, etc.  Radiologic Tests Ordered, included CT head. I independently interpreted the images and agree with radiology interpretation.   Cardiac Monitor Tracing which shows NSR.   Medical Decision Making: Summary:  The patient presents to the emergency department for evaluation of head injury after mechanical fall.  Some mild bruising around the right eye with intact extraocular movements.  No evidence of entrapment.  Plan for CT imaging of the head along with cervical spine and reassess.  Reevaluation with update and discussion with   ***Considered admission***  Patient's presentation is most consistent with acute presentation with potential threat to life or bodily function.   Disposition:   ____________________________________________  FINAL CLINICAL IMPRESSION(S) / ED DIAGNOSES  Final diagnoses:  None     NEW OUTPATIENT MEDICATIONS STARTED DURING THIS VISIT:  New Prescriptions   No medications on file    Note:  This document was prepared using Dragon voice recognition software and may include unintentional dictation errors.  Fonda Law, MD, [facility]

## 2024-04-18 NOTE — ED Triage Notes (Signed)
 Pt c/o mechanical fall today, fell face forward onto concrete.   Bruising noted to R temple. C/o R eye pain, headache.

## 2024-04-20 ENCOUNTER — Other Ambulatory Visit: Payer: Self-pay | Admitting: Family Medicine

## 2024-04-20 DIAGNOSIS — M4800 Spinal stenosis, site unspecified: Secondary | ICD-10-CM

## 2024-04-22 ENCOUNTER — Telehealth: Payer: Self-pay

## 2024-04-22 NOTE — Telephone Encounter (Signed)
 Transition Care Management Follow-up Telephone Call Date of discharge and from where: 04/19/24 [facility] How have you been since you were released from the hospital? Better Any questions or concerns? Yes; Pt has been trying to schedule with [facility] and unable to get scheduled.   Items Reviewed: Did the pt receive and understand the discharge instructions provided? Yes  Medications obtained and verified? Yes  Other? No  Any new allergies since your discharge? No  Dietary orders reviewed? No Do you have support at home? Yes   Home Care and Equipment/Supplies: Were home health services ordered? no If so, what is the name of the agency? N/A  Has the agency set up a time to come to the patient's home? not applicable Were any new equipment or medical supplies ordered?  No What is the name of the medical supply agency?  Were you able to get the supplies/equipment? not applicable Do you have any questions related to the use of the equipment or supplies? No  Functional Questionnaire: (I = Independent and D = Dependent) ADLs: I  Bathing/Dressing- I  Meal Prep- I  Eating- I  Maintaining continence- I  Transferring/Ambulation- I  Managing Meds- I  Follow up appointments reviewed:  PCP Hospital f/u appt confirmed? No  Pt states she doesn't feel appt is necessary at this time [facility] f/u appt confirmed? Unable to get scheduled with [facility]  Are transportation arrangements needed? No  If their condition worsens, is the pt aware to call PCP or go to the Emergency Dept.? Yes Was the patient provided with contact information for the PCP's office or ED? Yes Was to pt encouraged to call back with questions or concerns? Yes

## 2024-04-26 ENCOUNTER — Ambulatory Visit (INDEPENDENT_AMBULATORY_CARE_PROVIDER_SITE_OTHER)

## 2024-04-26 VITALS — Ht 61.0 in | Wt 110.0 lb

## 2024-04-26 DIAGNOSIS — Z0000 Encounter for general adult medical examination without abnormal findings: Secondary | ICD-10-CM

## 2024-04-26 NOTE — Patient Instructions (Signed)
 [name],  Thank you for taking the time for your Medicare Wellness Visit. I appreciate your continued commitment to your health goals. Please review the care plan we discussed, and feel free to reach out if I can assist you further.  Medicare recommends these wellness visits once per year to help you and your care team stay ahead of potential health issues. These visits are designed to focus on prevention, allowing your provider to concentrate on managing your acute and chronic conditions during your regular appointments.  Please note that Annual Wellness Visits do not include a physical exam. Some assessments may be limited, especially if the visit was conducted virtually. If needed, we may recommend a separate in-person follow-up with your provider.  Ongoing Care Seeing your primary care provider every 3 to 6 months helps us  monitor your health and provide consistent, personalized care.   Referrals If a referral was made during today's visit and you haven't received any updates within two weeks, please contact the referred provider directly to check on the status.  Recommended Screenings:  Health Maintenance  Topic Date Due   Eye exam for diabetics  Never done   Medicare Annual Wellness Visit  06/04/2023   COVID-19 Vaccine (6 - 2025-26 season) 04/11/2024   Flu Shot  11/08/2024*   Hemoglobin A1C  06/22/2024   Complete foot exam   12/14/2024   Yearly kidney health urinalysis for diabetes  12/20/2024   Yearly kidney function blood test for diabetes  01/27/2025   DTaP/Tdap/Td vaccine (5 - Td or Tdap) 02/10/2034   Pneumococcal Vaccine for age over 21  Completed   DEXA scan (bone density measurement)  Completed   Zoster (Shingles) Vaccine  Completed   HPV Vaccine  Aged Out   Meningitis B Vaccine  Aged Out  *Topic was postponed. The date shown is not the original due date.       04/18/2024   12:17 PM  Advanced Directives  Does Patient Have a Medical Advance Directive? Yes    Advance Care Planning is important because it: Ensures you receive medical care that aligns with your values, goals, and preferences. Provides guidance to your family and loved ones, reducing the emotional burden of decision-making during critical moments.  Vision: Annual vision screenings are recommended for early detection of glaucoma, cataracts, and diabetic retinopathy. These exams can also reveal signs of chronic conditions such as diabetes and high blood pressure.  Dental: Annual dental screenings help detect early signs of oral cancer, gum disease, and other conditions linked to overall health, including heart disease and diabetes.  Please see the attached documents for additional preventive care recommendations.

## 2024-04-26 NOTE — Progress Notes (Signed)
 Subjective:   [name] is a 83 y.o. who presents for a Medicare Wellness preventive visit.  As a reminder, Annual Wellness Visits don't include a physical exam, and some assessments may be limited, especially if this visit is performed virtually. We may recommend an in-person follow-up visit with your provider if needed.  Visit Complete: Virtual I connected with  Consuelo SHAUNNA Barefoot on 04/26/24 by a audio enabled telemedicine application and verified that I am speaking with the correct person using two identifiers.  Patient Location: Home  Provider Location: Home Office  I discussed the limitations of evaluation and management by telemedicine. The patient expressed understanding and agreed to proceed.  Vital Signs: Because this visit was a virtual/telehealth visit, some criteria may be missing or patient reported. Any vitals not documented were not able to be obtained and vitals that have been documented are patient reported.  VideoDeclined- This patient declined Librarian, academic. Therefore the visit was completed with audio only.  Persons Participating in Visit: Patient.  AWV Questionnaire: No: Patient Medicare AWV questionnaire was not completed prior to this visit.  Cardiac Risk Factors include: advanced age (>34men, >65 women);hypertension;diabetes mellitus;dyslipidemia     Objective:    Today's Vitals   04/26/24 1424  Weight: 110 lb (49.9 kg)  Height: 5' 1 (1.549 m)   Body mass index is 20.78 kg/m.     04/26/2024    2:31 PM 04/18/2024   12:17 PM 11/18/2023   11:30 AM 09/18/2022    7:22 AM 06/03/2022    8:07 AM 09/26/2021    9:12 AM 07/21/2021    3:47 AM  Advanced Directives  Does Patient Have a Medical Advance Directive? Yes Yes Yes Yes Yes Yes   Type of Estate agent of [place];Living will  Healthcare Power of [employer] of [place];Living will Healthcare Power of [place];Living will   Does patient  want to make changes to medical advance directive?    No - Patient declined     Copy of Healthcare Power of Attorney in Chart? No - copy requested    No - copy requested No - copy requested   Would patient like information on creating a medical advance directive?       No - Patient declined    Current Medications (verified) Outpatient Encounter Medications as of 04/26/2024  Medication Sig   ALPRAZolam  (XANAX ) 0.25 MG tablet Take 1 tablet (0.25 mg total) by mouth daily as needed for anxiety.   Calcium  Carbonate-Vitamin D (CALCIUM  + D PO) Take 600 mg by mouth daily.   diltiazem  (CARDIZEM  CD) 240 MG 24 hr capsule TAKE 1 CAPSULE BY MOUTH EVERY DAY   dorzolamide -timolol  (COSOPT ) 22.3-6.8 MG/ML ophthalmic solution Place 1 drop into both eyes 2 (two) times daily.   escitalopram  (LEXAPRO ) 20 MG tablet TAKE 1 TABLET BY MOUTH EVERY DAY   hyoscyamine  (LEVSIN  SL) 0.125 MG SL tablet Place 1 tablet (0.125 mg total) under the tongue every 6 (six) hours as needed.   melatonin 5 MG TABS Take 5 mg by mouth at bedtime as needed (Sleep).   ondansetron  (ZOFRAN -ODT) 4 MG disintegrating tablet Take 1 tablet (4 mg total) by mouth every 8 (eight) hours as needed for nausea or vomiting.   pantoprazole  (PROTONIX ) 20 MG tablet TAKE 1 TABLET BY MOUTH EVERY DAY   rosuvastatin  (CRESTOR ) 10 MG tablet TAKE 1 TABLET (10 MG TOTAL) BY MOUTH ONCE A WEEK.   valsartan  (DIOVAN ) 320 MG tablet TAKE 1  TABLET BY MOUTH EVERY DAY   No facility-administered encounter medications on file as of 04/26/2024.    Allergies (verified) Codeine   History: Past Medical History:  Diagnosis Date   Anxiety    Arthritis    Depression    Diverticulosis of colon 03/11/2007   Frequency of urination    GERD (gastroesophageal reflux disease) 10/14/2012   Glaucoma, both eyes    Hemorrhoids    Hiatal hernia    History of adenomatous polyp of colon    History of basal cell carcinoma (BCC) excision    History of Clostridioides difficile colitis     12/ 2022;  x2 in 2023 last one 07/ 2023   History of diverticulitis of colon    History of esophageal stricture 10/14/2012   s/p  dilatation 2014  and 01-02-2021 by dr avram   History of gout 07/2021   flare-up bilateral feet   History of kidney stones    Hyperlipidemia 09/22/2007   Hypertension    Irritable bowel syndrome with diarrhea    Osteopenia 02/09/2015   T score -1.4 FRAX 7.3%/0.6%   Posterior vaginal wall prolapse    PSVT (paroxysmal supraventricular tachycardia) (HCC) 08/2011   cardiologist--- dr kelsie;   first dx post op ACDF 08-14-2011   Rosacea 03/11/2007   SUI (stress urinary incontinence, female)    Type 2 diabetes, diet controlled (HCC)    followed by pcp   Uterovaginal prolapse, incomplete    Wears hearing aid in both ears    Past Surgical History:  Procedure Laterality Date   ANTERIOR AND POSTERIOR REPAIR WITH SACROSPINOUS FIXATION N/A 09/18/2022   Procedure: POSTERIOR REPAIR WITH PERINEORRHAPHY AND SACROSPINOUS FIXATION;  Surgeon: Marilynne Rosaline SAILOR, MD;  Location: [facility];  Service: Gynecology;  Laterality: N/A;  Total time requested is 1.5 hrs   ANTERIOR CERVICAL DECOMP/DISCECTOMY FUSION  05/10/2008   @[facility]  by dr gaither;   C4-6   ANTERIOR CERVICAL DECOMP/DISCECTOMY FUSION  08/14/2011   Procedure: ANTERIOR CERVICAL DECOMPRESSION/DISCECTOMY FUSION 1 LEVEL/HARDWARE REMOVAL;  Surgeon: Fairy JONETTA Levels, MD;  Location: [facility];  Service: Neurosurgery;  Laterality: N/A;  Cervical Six-Seven anterior cervical decompression with fusion interbody prothesis plating and bonegraft with removal of hardware of Cervical Four to Cervical Six    BIOPSY  09/26/2021   Procedure: BIOPSY;  Surgeon: Teressa Toribio SQUIBB, MD;  Location: [facility];  Service: Endoscopy;;   BLADDER SUSPENSION N/A 09/18/2022   Procedure: TRANSVAGINAL TAPE (TVT) PROCEDURE (POSSIBLE);  Surgeon: Marilynne Rosaline SAILOR, MD;  Location: [facility];  Service: Gynecology;   Laterality: N/A;   BUNIONECTOMY     1980s   COLONOSCOPY N/A 09/26/2021   Procedure: COLONOSCOPY;  Surgeon: Teressa Toribio SQUIBB, MD;  Location: [facility];  Service: Endoscopy;  Laterality: N/A;   CYSTOSCOPY N/A 09/18/2022   Procedure: CYSTOSCOPY;  Surgeon: Marilynne Rosaline SAILOR, MD;  Location: [facility];  Service: Gynecology;  Laterality: N/A;   ESOPHAGOGASTRODUODENOSCOPY (EGD) WITH ESOPHAGEAL DILATION  01/02/2021   dr avram   KIDNEY SURGERY  1960   age 89-   Open kidney stone removal   KNEE ARTHROSCOPY W/ MENISCAL REPAIR Left 05/2014   TONSILLECTOMY     child   Family History  Problem Relation Age of Onset   Mental illness Mother        comitted suicide   Glaucoma Mother    Lung cancer Father        smoker   Hypertension Father    Heart  disease Father        age 84, smoker   Cerebral palsy Sister    Stroke Sister        multiple   Pancreatic cancer Daughter    Heart attack Son        69   Heart attack Son        16   Stroke Paternal Aunt    Stroke Paternal Aunt    Stroke Paternal Uncle    Stroke Paternal Uncle    Stroke Paternal Uncle    Diabetes Maternal Grandmother    Colon cancer Neg Hx    Stomach cancer Neg Hx    Esophageal cancer Neg Hx    Rectal cancer Neg Hx    Social History   Socioeconomic History   Marital status: Widowed    Spouse name: Not on file   Number of children: Not on file   Years of education: Not on file   Highest education level: Not on file  Occupational History   Occupation: retired    Associate Professor: RETIRED  Tobacco Use   Smoking status: Never   Smokeless tobacco: Never  Vaping Use   Vaping status: Never Used  Substance and Sexual Activity   Alcohol use: No    Alcohol/week: 0.0 standard drinks of alcohol   Drug use: Never   Sexual activity: Not Currently    Birth control/protection: Post-menopausal    Comment: 1st intercourse 83 yo-Fewer than 5 partners  Other Topics Concern   Not on file  Social History  Narrative   Lives with son starting in 2024. Widowed. 2 living children (lost son to MI 2019 and daughter to pancreatic cancer 2020, another son MI 2021) and 7 grandchildren. 3 greatgrandkids      Retired from [employer] after 5 years and later took care of children.       Hobbies: time with grandkids, shopping, go out to eat. Travels once a year with girlfriends.    Social Drivers of Corporate investment banker Strain: Low Risk  (04/26/2024)   Overall Financial Resource Strain (CARDIA)    Difficulty of Paying Living Expenses: Not hard at all  Food Insecurity: No Food Insecurity (04/26/2024)   Hunger Vital Sign    Worried About Running Out of Food in the Last Year: Never true    Ran Out of Food in the Last Year: Never true  Transportation Needs: No Transportation Needs (04/26/2024)   PRAPARE - Administrator, Civil Service (Medical): No    Lack of Transportation (Non-Medical): No  Physical Activity: Inactive (04/26/2024)   Exercise Vital Sign    Days of Exercise per Week: 0 days    Minutes of Exercise per Session: 0 min  Stress: No Stress Concern Present (04/26/2024)   [employer] of Occupational Health - Occupational Stress Questionnaire    Feeling of Stress: Only a little  Social Connections: Moderately Integrated (04/26/2024)   Social Connection and Isolation Panel    Frequency of Communication with Friends and Family: More than three times a week    Frequency of Social Gatherings with Friends and Family: Twice a week    Attends Religious Services: More than 4 times per year    Active Member of [employer] or Organizations: Yes    Attends Banker Meetings: 1 to 4 times per year    Marital Status: Widowed    Tobacco Counseling Counseling given: Not Answered    Clinical Intake:  Pre-visit preparation completed: Yes  Pain : No/denies pain     BMI - recorded: 20.78 Nutritional Status: BMI of 19-24  Normal Nutritional Risks: None Diabetes: Yes CBG  done?: No Did pt. bring in CBG monitor from home?: No  Lab Results  Component Value Date   HGBA1C 6.5 12/21/2023   HGBA1C 6.6 (H) 06/15/2023   HGBA1C 6.7 (H) 12/10/2022     How often do you need to have someone help you when you read instructions, pamphlets, or other written materials from your doctor or pharmacy?: 1 - Never  Interpreter Needed?: No  Information entered by :: Ellouise Haws, LPN   Activities of Daily Living     04/26/2024    2:25 PM  In your present state of health, do you have any difficulty performing the following activities:  Hearing? 1  Comment hearing aids  Vision? 0  Difficulty concentrating or making decisions? 0  Walking or climbing stairs? 1  Comment hold on to rail  Dressing or bathing? 0  Doing errands, shopping? 0  Preparing Food and eating ? N  Using the Toilet? N  In the past six months, have you accidently leaked urine? N  Do you have problems with loss of bowel control? N  Managing your Medications? N  Managing your Finances? N  Housekeeping or managing your Housekeeping? N    Patient Care Team: Katrinka Garnette KIDD, MD as PCP - General (Family Medicine) Inocencio Soyla Lunger, MD as PCP - Electrophysiology (Cardiology) Leila Bound, [place] as Consulting Physician (Optometry) Avram Lupita BRAVO, MD as Consulting Physician (Gastroenterology) Georgia Blonder, MD as Consulting Physician (Obstetrics and Gynecology) Kelsie Agent, MD (Inactive) as Consulting Physician (Cardiology)  I have updated your Care Teams any recent Medical Services you may have received from other providers in the past year.     Assessment:   This is a routine wellness examination for [place].  Hearing/Vision screen Vision Screening - Comments:: Wears rx glasses - up to date with routine eye exams with Dr Charis    Goals Addressed             This Visit's Progress    Patient Stated       Walk more and wear hearing aids        Depression Screen      04/26/2024    2:27 PM 03/22/2024    2:01 PM 12/15/2023    3:50 PM 09/25/2023    2:01 PM 06/15/2023   10:57 AM 12/10/2022   11:36 AM 06/03/2022    8:05 AM  PHQ 2/9 Scores  PHQ - 2 Score 1 0 3 0 0 2 1  PHQ- 9 Score 1 0 11 0 0 4     Fall Risk     04/26/2024    2:30 PM 03/22/2024    2:00 PM 06/15/2023   10:57 AM 06/03/2022    8:09 AM 02/06/2021    9:41 AM  Fall Risk   Falls in the past year? 1 1 0 0 0  Number falls in past yr: 1 1 0 0 0  Injury with Fall? 1 1 0 0 0  Risk for fall due to : History of fall(s);Impaired balance/gait;Impaired mobility;Impaired vision No Fall Risks No Fall Risks No Fall Risks;Impaired vision;Impaired balance/gait No Fall Risks  Follow up Falls prevention discussed Falls evaluation completed Falls evaluation completed Falls prevention discussed  Falls evaluation completed      Data saved with a previous flowsheet row definition    MEDICARE RISK AT HOME:  Medicare Risk at Home Any stairs in or around the home?: No If so, are there any without handrails?: No Home free of loose throw rugs in walkways, pet beds, electrical cords, etc?: Yes Adequate lighting in your home to reduce risk of falls?: Yes Life alert?: No Use of a cane, walker or w/c?: No Grab bars in the bathroom?: No Shower chair or bench in shower?: No Elevated toilet seat or a handicapped toilet?: No  TIMED UP AND GO:  Was the test performed?  No  Cognitive Function: 6CIT completed    07/25/2016    9:20 AM  MMSE - Mini Mental State Exam  Not completed: --        04/26/2024    2:31 PM 06/03/2022    8:10 AM 07/21/2019   10:44 AM  6CIT Screen  What Year? 0 points 0 points 0 points  What month? 0 points 0 points 0 points  What time? 0 points 0 points 0 points  Count back from 20 0 points 0 points 0 points  Months in reverse 0 points 2 points 0 points  Repeat phrase 0 points 4 points 2 points  Total Score 0 points 6 points 2 points    Immunizations Immunization History   Administered Date(s) Administered   Fluad Quad(high Dose 65+) 05/24/2021, 06/21/2022   Fluad Trivalent(High Dose 65+) 06/15/2023   INFLUENZA, HIGH DOSE SEASONAL PF 06/10/2013, 06/02/2014, 06/09/2019, 05/25/2020, 08/07/2023   Influenza Split 06/25/2011, 06/11/2012   Influenza Whole 06/29/2009, 06/27/2010   Influenza,inj,Quad PF,6+ Mos 06/09/2019   Influenza-Unspecified 05/18/2015, 05/15/2016, 05/28/2017, 05/26/2018   PFIZER Comirnaty(Gray Top)Covid-19 Tri-Sucrose Vaccine 11/22/2020   PFIZER(Purple Top)SARS-COV-2 Vaccination 09/15/2019, 10/10/2019, 05/11/2020   Pfizer Covid-19 Vaccine Bivalent Booster 21yrs & up 05/10/2021   Pneumococcal Conjugate-13 07/14/2014   Pneumococcal Polysaccharide-23 09/11/2005   Td 11/10/1998, 10/20/2008   Tdap 02/06/2015, 02/11/2024   Zoster Recombinant(Shingrix) 02/07/2021, 04/25/2021   Zoster, Live 03/22/2008    Screening Tests Health Maintenance  Topic Date Due   OPHTHALMOLOGY EXAM  Never done   COVID-19 Vaccine (6 - 2025-26 season) 04/11/2024   Influenza Vaccine  11/08/2024 (Originally 03/11/2024)   HEMOGLOBIN A1C  06/22/2024   FOOT EXAM  12/14/2024   Diabetic kidney evaluation - Urine ACR  12/20/2024   Diabetic kidney evaluation - eGFR measurement  01/27/2025   Medicare Annual Wellness (AWV)  04/26/2025   DTaP/Tdap/Td (5 - Td or Tdap) 02/10/2034   Pneumococcal Vaccine: 50+ Years  Completed   DEXA SCAN  Completed   Zoster Vaccines- Shingrix  Completed   HPV VACCINES  Aged Out   Meningococcal B Vaccine  Aged Out    Health Maintenance Items Addressed: See Nurse Notes at the end of this note  Additional Screening:  Vision Screening: Recommended annual ophthalmology exams for early detection of glaucoma and other disorders of the eye. Is the patient up to date with their annual eye exam?  Yes  Who is the provider or what is the name of the office in which the patient attends annual eye exams? Dr Charis   Dental Screening: Recommended annual  dental exams for proper oral hygiene  Community Resource Referral / Chronic Care Management: CRR required this visit?  No   CCM required this visit?  No   Plan:    I have personally reviewed and noted the following in the patient's chart:   Medical and social history Use of alcohol, tobacco or illicit drugs  Current medications and supplements including opioid prescriptions. Patient is not currently taking  opioid prescriptions. Functional ability and status Nutritional status Physical activity Advanced directives List of other physicians Hospitalizations, surgeries, and ER visits in previous 12 months Vitals Screenings to include cognitive, depression, and falls Referrals and appointments  In addition, I have reviewed and discussed with patient certain preventive protocols, quality metrics, and best practice recommendations. A written personalized care plan for preventive services as well as general preventive health recommendations were provided to patient.   Ellouise VEAR Haws, LPN   [id number]   After Visit Summary: (MyChart) Due to this being a telephonic visit, the after visit summary with patients personalized plan was offered to patient via MyChart   Notes: Nothing significant to report at this time. Waiting for Dr Charis ophthalmology to follow up for appt

## 2024-06-27 ENCOUNTER — Ambulatory Visit: Payer: Self-pay

## 2024-06-27 NOTE — Telephone Encounter (Signed)
Please see patient message and advise.

## 2024-06-27 NOTE — Telephone Encounter (Signed)
 Reasonable for office visit

## 2024-06-27 NOTE — Telephone Encounter (Signed)
 FYI Only or Action Required?: FYI only for provider: appointment scheduled on 06/27/2024.  Patient was last seen in primary care on 03/22/2024 by Courtney Garnette KIDD, MD.  Called Nurse Triage reporting Pain.  Symptoms began worsening last couple of weeks.  Interventions attempted: Prescription medications: PT therapy.  Symptoms are: gradually worsening.  Triage Disposition: See Physician Within 24 Hours  Patient/caregiver understands and will follow disposition?: Yes   Copied from CRM #[id number]. Topic: Clinical - Red Word Triage >> Jun 27, 2024 11:36 AM Drema MATSU wrote: Red Word that prompted transfer to Nurse Triage: Pt went in for therapy for 6 weeks and it is not helping. She is still having extreme leg pain. Reason for Disposition  [1] Painful rash AND [2] multiple small blisters grouped together (i.e., dermatomal distribution or band or stripe)  Answer Assessment - Initial Assessment Questions 1. ONSET: When did the pain start?      6 weeks and worsening 2. LOCATION: Where is the pain located?      Bilateral leg pain - goes up and down legs 3. PAIN: How bad is the pain?    (Scale 1-10; or mild, moderate, severe)     Moderate  4. WORK OR EXERCISE: Has there been any recent work or exercise that involved this part of the body?      na 5. CAUSE: What do you think is causing the leg pain?     Unknown - might be related to disc 6. OTHER SYMPTOMS: Do you have any other symptoms? (e.g., chest pain, back pain, breathing difficulty, swelling, rash, fever, numbness, weakness)     Redness on right ankle, right has swelling around ankle 7. PREGNANCY: Is there any chance you are pregnant? When was your last menstrual period?     Na  Pt went in for therapy for 6 weeks and it is not helping.  Protocols used: Leg Pain-A-AH

## 2024-06-28 ENCOUNTER — Encounter: Payer: Self-pay | Admitting: Family Medicine

## 2024-06-28 ENCOUNTER — Ambulatory Visit (INDEPENDENT_AMBULATORY_CARE_PROVIDER_SITE_OTHER): Admitting: Family Medicine

## 2024-06-28 VITALS — BP 110/60 | HR 59 | Temp 98.1°F | Ht 61.0 in | Wt 110.2 lb

## 2024-06-28 DIAGNOSIS — M48061 Spinal stenosis, lumbar region without neurogenic claudication: Secondary | ICD-10-CM

## 2024-06-28 DIAGNOSIS — E119 Type 2 diabetes mellitus without complications: Secondary | ICD-10-CM | POA: Diagnosis not present

## 2024-06-28 MED ORDER — GABAPENTIN 100 MG PO CAPS: 100.0000 mg | ORAL_CAPSULE | Freq: Three times a day (TID) | ORAL | 0 refills | Status: DC

## 2024-06-28 MED ORDER — PREDNISONE 20 MG PO TABS: 20.0000 mg | ORAL_TABLET | Freq: Every day | ORAL | 0 refills | Status: DC

## 2024-06-28 NOTE — Progress Notes (Addendum)
   [name] is a 83 y.o. female who presents today for an office visit.  Assessment/Plan:  Spinal Stenosis  Had a lengthy discussion with patient today regarding her chronic leg pain secondary to lumbar spinal stenosis.  We reviewed her MRI from 2 months ago as well as her recent visits with Dr. Mavis with neurosurgery.  She has completed physical therapy though still has persistent and ongoing pain.  She is not interested in pursuing surgery or injections at this point and does not wish to follow back up with neurosurgery for now.  We did discuss alternative treatment options.  Will start low-dose gabapentin  100 mg 3 times daily though did advise her to start only 100 mg nightly at first for the first few days.  Will also start prednisone  20 mg daily.  she does have history of diabetes however most recent A1c well-controlled-do not think short prednisone  burst will be an issue.  She will follow-up with us  in a couple of weeks and we can adjust medications as tolerated.  We discussed reasons to return to care.  TYPE 2 DIABETES MELLITUS most recent A1c well-controlled-do not think short prednisone  burst will be an issue.     Subjective:  HPI:  See assessment / plan for status of chronic conditions.   Discussed the use of AI scribe software for clinical note transcription with the patient, who gave verbal consent to proceed.  History of Present Illness [name] is an 83 year old female with spinal canal stenosis who presents with persistent leg pain.  This has been a persistent issue for many months though seems to have worsened recently.  She saw her PCP for this a few months ago.  Ultimately received an MRI which was conducted 2 months ago.  This showed moderate to severe spinal canal stenosis at L3-4 with crowding of cauda equina nerve roots and bilateral foraminal stenosis.  She also was found to have foraminal stenosis at multiple levels with most severe at L4-5 and L5-S1 with  disc bulge contacting exiting L5 nerve roots.  She was subsequently referred to neurosurgery.  They recommended a course of physical therapy which she has completed over the last 6 weeks however still has persistent pain.  She experiences ongoing leg pain that worsens after two days of activity, necessitating rest on the third day. The pain is significant, affects both legs, and originates from her back.  Her past medical history includes diabetes, which is reportedly well-controlled.         Objective:  Physical Exam: BP 110/60   Pulse (!) 59   Temp 98.1 F (36.7 C) (Temporal)   Ht 5' 1 (1.549 m)   Wt 110 lb 3.2 oz (50 kg)   LMP 08/12/1991   SpO2 97%   BMI 20.82 kg/m   Gen: No acute distress, resting comfortably CV: Regular rate and rhythm with no murmurs appreciated Pulm: Normal work of breathing, clear to auscultation bilaterally with no crackles, wheezes, or rhonchi Neuro: Grossly normal, moves all extremities Psych: Normal affect and thought content      [name] M. Kennyth, MD 06/28/2024 12:13 PM

## 2024-06-28 NOTE — Patient Instructions (Signed)
 It was very nice to see you today!  VISIT SUMMARY: Today, we discussed your ongoing leg pain due to spinal canal stenosis. We reviewed your MRI results and talked about new medication options to help manage your pain.  YOUR PLAN: LUMBAR SPINAL STENOSIS WITH NEUROGENIC CLAUDICATION AND BILATERAL LEG PAIN: You have chronic lumbar spinal stenosis, which is causing nerve-related leg pain that worsens with activity and improves with rest. -Start taking gabapentin  at a low dose at night. You can increase the dose as needed based on your response. -Take prednisone  for a short period to help reduce inflammation. -Follow up with Doctor Katrinka or Doctor Mavis in two weeks to see how the new medications are working for you. -Please contact us  via MyChart or phone in two weeks to let us  know how the medications are affecting your pain.  Return if symptoms worsen or fail to improve.   Take care, Dr Kennyth  PLEASE NOTE:  If you had any lab tests, please let us  know if you have not heard back within a few days. You may see your results on mychart before we have a chance to review them but we will give you a call once they are reviewed by us .   If we ordered any referrals today, please let us  know if you have not heard from their office within the next week.   If you had any urgent prescriptions sent in today, please check with the pharmacy within an hour of our visit to make sure the prescription was transmitted appropriately.   Please try these tips to maintain a healthy lifestyle:  Eat at least 3 REAL meals and 1-2 snacks per day.  Aim for no more than 5 hours between eating.  If you eat breakfast, please do so within one hour of getting up.   Each meal should contain half fruits/vegetables, one quarter protein, and one quarter carbs (no bigger than a computer mouse)  Cut down on sweet beverages. This includes juice, soda, and sweet tea.   Drink at least 1 glass of water with each meal and aim  for at least 8 glasses per day  Exercise at least 150 minutes every week.

## 2024-07-21 ENCOUNTER — Other Ambulatory Visit: Payer: Self-pay | Admitting: Family Medicine

## 2024-08-08 ENCOUNTER — Ambulatory Visit: Payer: Self-pay

## 2024-08-08 NOTE — Telephone Encounter (Signed)
 Pt refused sooner appt with provider other than PCP. Scheduled soonest PCP appt and added to waitlist. Advised to call back with worsening of symptoms or new symptoms.  FYI Only or Action Required?: FYI only for provider: appointment scheduled on 07/19/24.  Patient was last seen in primary care on 06/28/2024 by Kennyth Worth HERO, MD.  Called Nurse Triage reporting Pain.  Symptoms began several months ago.  Interventions attempted: OTC medications: tylenol , ibuprofen .  Symptoms are: unchanged.  Triage Disposition: See PCP When Office is Open (Within 3 Days)  Patient/caregiver understands and will follow disposition?: No, refuses disposition  Reason for Disposition  [1] MODERATE back pain (e.g., interferes with normal activities) AND [2] present > 3 days  Answer Assessment - Initial Assessment Questions Pt reports chronic and intermittent pain in her back and the back of her legs. States previous MRI showed possible ruptured discs and neurologist had her go to PT which helped for a few weeks.  1. ONSET: When did the pain begin? (e.g., minutes, hours, days)     6 months to one year 2. LOCATION: Where does it hurt? (upper, mid or lower back)     Back and back of legs 3. SEVERITY: How bad is the pain?  (e.g., Scale 1-10; mild, moderate, or severe)     8/10 4. PATTERN: Is the pain constant? (e.g., yes, no; constant, intermittent)      Intermittent 5. RADIATION: Does the pain shoot into your legs or somewhere else?     Legs 6. CAUSE:  What do you think is causing the back pain?      Neuro/back 8. MEDICINES: What have you taken so far for the pain? (e.g., nothing, acetaminophen , NSAIDS)     Tylenol  and ibuprofen  9. NEUROLOGIC SYMPTOMS: Do you have any weakness, numbness, or problems with bowel/bladder control?     Denies  Protocols used: Back Pain-A-AH  Copied from CRM [id number]. Topic: Clinical - Red Word Triage >> Aug 08, 2024 10:11 AM Viola F wrote: Red Word  that prompted transfer to Nurse Triage: Patient having leg and back pain - requesting appt >> Aug 08, 2024 10:21 AM Viola F wrote: Patient no longer wanted to hold - please call back  Past Medical History:  Diagnosis Date   Anxiety    Arthritis    Depression    Diverticulosis of colon 03/11/2007   Frequency of urination    GERD (gastroesophageal reflux disease) 10/14/2012   Glaucoma, both eyes    Hemorrhoids    Hiatal hernia    History of adenomatous polyp of colon    History of basal cell carcinoma (BCC) excision    History of Clostridioides difficile colitis    12/ 2022;  x2 in 2023 last one 07/ 2023   History of diverticulitis of colon    History of esophageal stricture 10/14/2012   s/p  dilatation 2014  and 01-02-2021 by dr avram   History of gout 07/2021   flare-up bilateral feet   History of kidney stones    Hyperlipidemia 09/22/2007   Hypertension    Irritable bowel syndrome with diarrhea    Osteopenia 02/09/2015   T score -1.4 FRAX 7.3%/0.6%   Posterior vaginal wall prolapse    PSVT (paroxysmal supraventricular tachycardia) 08/2011   cardiologist--- dr kelsie;   first dx post op ACDF 08-14-2011   Rosacea 03/11/2007   SUI (stress urinary incontinence, female)    Type 2 diabetes, diet controlled (HCC)    followed by pcp   Uterovaginal  prolapse, incomplete    Wears hearing aid in both ears

## 2024-08-08 NOTE — Telephone Encounter (Signed)
 Patient scheduled to see Dr. Katrinka 08/20/2023. Dr. Katrinka will review notes.

## 2024-08-12 ENCOUNTER — Other Ambulatory Visit

## 2024-08-12 ENCOUNTER — Ambulatory Visit: Admitting: Family Medicine

## 2024-08-12 ENCOUNTER — Ambulatory Visit: Payer: Self-pay | Admitting: Family Medicine

## 2024-08-12 ENCOUNTER — Encounter: Payer: Self-pay | Admitting: Family Medicine

## 2024-08-12 VITALS — BP 120/68 | HR 69 | Temp 97.9°F | Ht 61.0 in | Wt 114.4 lb

## 2024-08-12 DIAGNOSIS — R351 Nocturia: Secondary | ICD-10-CM

## 2024-08-12 DIAGNOSIS — E785 Hyperlipidemia, unspecified: Secondary | ICD-10-CM | POA: Diagnosis not present

## 2024-08-12 DIAGNOSIS — I10 Essential (primary) hypertension: Secondary | ICD-10-CM | POA: Diagnosis not present

## 2024-08-12 DIAGNOSIS — E1169 Type 2 diabetes mellitus with other specified complication: Secondary | ICD-10-CM | POA: Diagnosis not present

## 2024-08-12 DIAGNOSIS — M79604 Pain in right leg: Secondary | ICD-10-CM

## 2024-08-12 DIAGNOSIS — M79605 Pain in left leg: Secondary | ICD-10-CM | POA: Diagnosis not present

## 2024-08-12 LAB — CBC WITH DIFFERENTIAL/PLATELET
Basophils Absolute: 0.1 K/uL (ref 0.0–0.1)
Basophils Relative: 0.5 % (ref 0.0–3.0)
Eosinophils Absolute: 0.2 K/uL (ref 0.0–0.7)
Eosinophils Relative: 1.6 % (ref 0.0–5.0)
HCT: 37.2 % (ref 36.0–46.0)
Hemoglobin: 12.2 g/dL (ref 12.0–15.0)
Lymphocytes Relative: 29.6 % (ref 12.0–46.0)
Lymphs Abs: 3 K/uL (ref 0.7–4.0)
MCHC: 33 g/dL (ref 30.0–36.0)
MCV: 92.2 fl (ref 78.0–100.0)
Monocytes Absolute: 0.7 K/uL (ref 0.1–1.0)
Monocytes Relative: 7.3 % (ref 3.0–12.0)
Neutro Abs: 6.1 K/uL (ref 1.4–7.7)
Neutrophils Relative %: 61 % (ref 43.0–77.0)
Platelets: 309 K/uL (ref 150.0–400.0)
RBC: 4.03 Mil/uL (ref 3.87–5.11)
RDW: 13.6 % (ref 11.5–15.5)
WBC: 10 K/uL (ref 4.0–10.5)

## 2024-08-12 LAB — COMPREHENSIVE METABOLIC PANEL WITH GFR
ALT: 16 U/L (ref 3–35)
AST: 15 U/L (ref 5–37)
Albumin: 4.2 g/dL (ref 3.5–5.2)
Alkaline Phosphatase: 91 U/L (ref 39–117)
BUN: 17 mg/dL (ref 6–23)
CO2: 31 meq/L (ref 19–32)
Calcium: 9.6 mg/dL (ref 8.4–10.5)
Chloride: 100 meq/L (ref 96–112)
Creatinine, Ser: 0.91 mg/dL (ref 0.40–1.20)
GFR: 57.48 mL/min — ABNORMAL LOW
Glucose, Bld: 96 mg/dL (ref 70–99)
Potassium: 3.6 meq/L (ref 3.5–5.1)
Sodium: 141 meq/L (ref 135–145)
Total Bilirubin: 0.6 mg/dL (ref 0.2–1.2)
Total Protein: 7.3 g/dL (ref 6.0–8.3)

## 2024-08-12 LAB — HEMOGLOBIN A1C: Hgb A1c MFr Bld: 6.5 % (ref 4.6–6.5)

## 2024-08-12 NOTE — Patient Instructions (Addendum)
 Call [place] [facility] back- they wanted to know if you are not doing better. They have 2 more steps in line for you at this point - the next one is they will try to get you in with a pain management doctor to consider injections  Please stop by lab before you go If you have mychart- we will send your results within 3 business days of us  receiving them.  If you do not have mychart- we will call you about results within 5 business days of us  receiving them.  *please also note that you will see labs on mychart as soon as they post. I will later go in and write notes on them- will say notes from Dr. Katrinka   Recommended follow up: cancel February visit and reschedule for 6 months

## 2024-08-12 NOTE — Progress Notes (Signed)
 " Phone [phone] In person visit   Subjective:   [name] is a 84 y.o. year old very pleasant female patient who presents for/with See problem oriented charting Chief Complaint  Patient presents with   Back Pain   Leg Pain    Pt states she is still having on going leg pain; she has had a mri and they told her the pain was coming from something wrong in her back;    Urinary Frequency    Has been going on for a long time; wakes 2-3 times a night to urinate;     Past Medical History-  Patient Active Problem List   Diagnosis Date Noted   Diabetes mellitus without complication (HCC) 03/23/2019    Priority: High   History of supraventricular tachycardia 08/14/2011    Priority: High   Osteopenia of left femoral neck 08/16/2021    Priority: Medium    Aortic atherosclerosis 07/21/2019    Priority: Medium    GERD (gastroesophageal reflux disease) 07/14/2014    Priority: Medium    Primary open angle glaucoma of both eyes, mild stage 05/29/2014    Priority: Medium    IBS (irritable bowel syndrome)- diarrhea predominant 01/26/2013    Priority: Medium    Glaucoma associated with ocular disorder 11/05/2009    Priority: Medium    Hyperlipidemia associated with type 2 diabetes mellitus (HCC) 09/22/2007    Priority: Medium    Major depression in full remission (HCC)- Lexapro  20mg . Xanax  once a week or less. 03/11/2007    Priority: Medium    Essential hypertension 03/11/2007    Priority: Medium    Sensorineural hearing loss (SNHL), bilateral 04/06/2020    Priority: Low   History of colonic polyps 11/28/2011    Priority: Low   Senile nuclear sclerosis 06/29/2011    Priority: Low   Cervical disc disorder with radiculopathy of cervical region 11/05/2009    Priority: Low   Rosacea 03/11/2007    Priority: Low   SVT (supraventricular tachycardia) 08/16/2021   Hypokalemia 07/21/2021   C. difficile diarrhea 07/20/2021   Vertigo 04/06/2020   Knee pain, left 11/02/2013     Medications- reviewed and updated Current Outpatient Medications  Medication Sig Dispense Refill   ALPRAZolam  (XANAX ) 0.25 MG tablet TAKE 1 TABLET BY MOUTH DAILY AS NEEDED FOR ANXIETY 30 tablet 5   Calcium  Carbonate-Vitamin D (CALCIUM  + D PO) Take 600 mg by mouth daily.     diltiazem  (CARDIZEM  CD) 240 MG 24 hr capsule TAKE 1 CAPSULE BY MOUTH EVERY DAY 90 capsule 3   dorzolamide -timolol  (COSOPT ) 22.3-6.8 MG/ML ophthalmic solution Place 1 drop into both eyes 2 (two) times daily.     escitalopram  (LEXAPRO ) 20 MG tablet TAKE 1 TABLET BY MOUTH EVERY DAY 90 tablet 3   gabapentin  (NEURONTIN ) 100 MG capsule Take 1 capsule (100 mg total) by mouth 3 (three) times daily. 90 capsule 0   hyoscyamine  (LEVSIN  SL) 0.125 MG SL tablet Place 1 tablet (0.125 mg total) under the tongue every 6 (six) hours as needed. 30 tablet 2   melatonin 5 MG TABS Take 5 mg by mouth at bedtime as needed (Sleep).     ondansetron  (ZOFRAN -ODT) 4 MG disintegrating tablet Take 1 tablet (4 mg total) by mouth every 8 (eight) hours as needed for nausea or vomiting. 20 tablet 0   pantoprazole  (PROTONIX ) 20 MG tablet TAKE 1 TABLET BY MOUTH EVERY DAY 90 tablet 3   rosuvastatin  (CRESTOR ) 10 MG tablet TAKE 1 TABLET (10 MG  TOTAL) BY MOUTH ONCE A WEEK. 12 tablet 4   valsartan  (DIOVAN ) 320 MG tablet TAKE 1 TABLET BY MOUTH EVERY DAY 90 tablet 3   No current facility-administered medications for this visit.     Objective:  BP 120/68 (BP Location: Left Arm, Patient Position: Sitting, Cuff Size: Normal)   Pulse 69   Temp 97.9 F (36.6 C) (Temporal)   Ht 5' 1 (1.549 m)   Wt 114 lb 6.4 oz (51.9 kg)   LMP 08/12/1991   SpO2 96%   BMI 21.62 kg/m  Gen: NAD, resting comfortably CV: RRR no murmurs rubs or gallops Lungs: CTAB no crackles, wheeze, rhonchi.  Ext: no edema Skin: warm, dry Neuro: grossly normal, moves all extremities     Assessment and Plan     # Leg pain and patient with known spinal stenosis S:patient with ongoing  leg pain. Has even had MRI and she reports was told this could be related to her back.  From [facility]24 noted moderate to severe spinal canal stenosis at L3-L4 with crowding of the cauda equina nerve roots and moderate bilateral forminal stenosis with disc bulge likely contacting extraforaminal portion of L# nerve root amongst other degenerative changes- as well as L5-S1 disc bulge likely contacting L5 nerve root worse on left -does not think taking gabapentin - she may try at night  We placed urgent referral to neurosurgery and she was seen on 04/22/24. They recommended physical therapy to start with next step being referral to pain management for potential injections. After that they concesidered lumbar meylo CT through Dr. Mavis  Patient did help short term but she continues to have significant pain in both legs in back of lfegs  No incontinence of bowel or bladder A/P: Ongoing leg and buttocks pain and patient with spinal stenosis without significant improvement with physical therapy.  Reviewed neurosurgery notes with her stating that step was to call back to discuss injections/pain management-she agrees to call them back at this point  # urinary frequency S:has been going on for few months. 2-3 times a night nocturia. A lot of daytime frequency as well.  Did have urinary tract infection back in April but was treated A/P: with ongoing urinary frequency and nocturia- we will check a urinalysis and culture to evaluate for urinary tract infection.   - myrbetriq not ideal with hyoscyamine  and seems to take daily but we did strongly consider this   # Prolapse-required surgical correction 2024  but with some urinary urgency after surgery which was discouraging for her.  With urinary issues could refer back to urogynecology  # Diabetes- diagnosed in 2020 with mild microalbuminuria at 55 with correction 12/10/2022 later worsened to 226 May 2025  S: Diet controlled.   -Valsartan  320 mg for  microalbuminuria, declines Jardianceconcern for UTI risk especially with baseline urinary issues Lab Results  Component Value Date   HGBA1C 6.5 08/12/2024   HGBA1C 6.5 12/21/2023   HGBA1C 6.6 (H) 06/15/2023    A/P: Diabetes thankfully remains well-controlled today-continue diet control. UACR pending  #hyperlipidemia associated with diabetes/aortic atherosclerosis- incidental finding on prior imaging  S:  rosuvastatin  10 mg weekly  Lab Results  Component Value Date   CHOL 159 12/21/2023   HDL 35.80 (L) 12/21/2023   LDLCALC 80 12/21/2023   LDLDIRECT 95.0 04/18/2021   TRIG 218.0 (H) 12/21/2023   CHOLHDL 4 12/21/2023   A/P: Cholesterol reasonably controlled though not perfectly controlled-continue current medication  #hypertension/history of SVT- sees cardiology #history of hypokalemia with  C. diff S: medication(s): Cardizem  240mg  ER, valsartan  320 mg No obvious recurrence of SVT on Cardizem . A/P: Blood pressure well-controlled-no obvious recurrence of SVT-continue current medication  #Depression/anxiety- lost son to MI 2019 and daughter to pancreatic cancer 2020 and another son to MI 2021 then granddaughter lost husband in [facility] 2021 S: Patient compliant with Lexapro  20 mg.  Patient takes sparing alprazolam     08/12/2024    1:26 PM 04/26/2024    2:27 PM 03/22/2024    2:01 PM  Depression screen PHQ 2/9  Decreased Interest 0 0 0  Down, Depressed, Hopeless 1 1 0  PHQ - 2 Score 1 1 0  Altered sleeping 2 0 0  Tired, decreased energy 2 0 0  Change in appetite 0 0 0  Feeling bad or failure about yourself  0 0 0  Trouble concentrating 0 0 0  Moving slowly or fidgety/restless 0 0 0  Suicidal thoughts 0 0 0  PHQ-9 Score 5 1  0   Difficult doing work/chores Not difficult at all  Not difficult at all     Data saved with a previous flowsheet row definition  A/P: PHQ-9 of 5 or less would consider full remission-holidays can be tough after prior family losses for her.  Continue current  medication   # Abdominal cramping/IBS-on hyoscyamine  through gastroenterology-finds helpful so wants to continue   Recommended follow up: cancel February visit and reschedule for 6 months Future Appointments  Date Time Provider Department Center  09/22/2024  2:20 PM Katrinka Garnette KIDD, MD [facility]  02/09/2025  1:00 PM Katrinka Garnette KIDD, MD [facility]    Lab/Order associations:   ICD-10-CM   1. Nocturia  R35.1 Urinalysis, Routine w reflex microscopic    Urine Culture    Urine Culture    CANCELED: Urine Culture    2. Bilateral leg pain  M79.604    M79.605     3. Hyperlipidemia associated with type 2 diabetes mellitus (HCC)  E11.69 CBC with Differential/Platelet   E78.5 Comprehensive metabolic panel with GFR    Hemoglobin A1c    Urine Microalbumin w/creat. ratio    Urine Microalbumin w/creat. ratio    CANCELED: Urine Microalbumin w/creat. ratio    CANCELED: Urine Microalbumin w/creat. ratio    4. Essential hypertension  I10       No orders of the defined types were placed in this encounter.   Return precautions advised.  Garnette Katrinka, MD  "

## 2024-08-14 LAB — URINE CULTURE
MICRO NUMBER:: 17419579
SPECIMEN QUALITY:: ADEQUATE

## 2024-08-14 LAB — MICROALBUMIN / CREATININE URINE RATIO
Creatinine, Urine: 42 mg/dL (ref 20–275)
Microalb Creat Ratio: 112 mg/g{creat} — ABNORMAL HIGH
Microalb, Ur: 4.7 mg/dL

## 2024-08-15 MED ORDER — AMOXICILLIN-POT CLAVULANATE 875-125 MG PO TABS: 1.0000 | ORAL_TABLET | Freq: Two times a day (BID) | ORAL | 0 refills | Status: AC

## 2024-08-19 ENCOUNTER — Ambulatory Visit: Admitting: Family Medicine

## 2024-08-30 ENCOUNTER — Other Ambulatory Visit: Payer: Self-pay | Admitting: Family Medicine

## 2024-09-22 ENCOUNTER — Ambulatory Visit: Admitting: Family Medicine

## 2025-02-09 ENCOUNTER — Ambulatory Visit: Admitting: Family Medicine
